# Patient Record
Sex: Female | Born: 1945 | Hispanic: No | Smoking: Former smoker
Health system: Southern US, Community
[De-identification: ages and names within clinical notes are randomized; demographics above are authoritative.]

## PROBLEM LIST (undated history)

## (undated) DIAGNOSIS — M1990 Unspecified osteoarthritis, unspecified site: Secondary | ICD-10-CM

## (undated) DIAGNOSIS — J189 Pneumonia, unspecified organism: Secondary | ICD-10-CM

## (undated) DIAGNOSIS — D649 Anemia, unspecified: Secondary | ICD-10-CM

## (undated) DIAGNOSIS — F32A Depression, unspecified: Secondary | ICD-10-CM

## (undated) DIAGNOSIS — I10 Essential (primary) hypertension: Secondary | ICD-10-CM

## (undated) DIAGNOSIS — M4800 Spinal stenosis, site unspecified: Secondary | ICD-10-CM

## (undated) DIAGNOSIS — E119 Type 2 diabetes mellitus without complications: Secondary | ICD-10-CM

## (undated) HISTORY — DX: Essential (primary) hypertension: I10

## (undated) HISTORY — PX: WRIST GANGLION EXCISION: SUR520

## (undated) HISTORY — PX: UTERINE FIBROID SURGERY: SHX826

## (undated) HISTORY — PX: CHOLECYSTECTOMY: SHX55

## (undated) HISTORY — PX: APPENDECTOMY: SHX54

## (undated) HISTORY — DX: Type 2 diabetes mellitus without complications: E11.9

---

## 2007-12-06 ENCOUNTER — Other Ambulatory Visit: Admission: RE | Admit: 2007-12-06 | Discharge: 2007-12-06 | Payer: Self-pay | Admitting: Family Medicine

## 2010-12-25 ENCOUNTER — Other Ambulatory Visit: Payer: Self-pay | Admitting: Family Medicine

## 2010-12-25 DIAGNOSIS — Z1231 Encounter for screening mammogram for malignant neoplasm of breast: Secondary | ICD-10-CM

## 2011-01-19 ENCOUNTER — Ambulatory Visit
Admission: RE | Admit: 2011-01-19 | Discharge: 2011-01-19 | Disposition: A | Discharge status: Home / Self Care | Payer: Medicare Other | Source: Ambulatory Visit | Attending: Family Medicine | Admitting: Family Medicine

## 2011-01-19 DIAGNOSIS — Z1231 Encounter for screening mammogram for malignant neoplasm of breast: Secondary | ICD-10-CM

## 2011-12-11 ENCOUNTER — Other Ambulatory Visit: Payer: Self-pay | Admitting: Family Medicine

## 2011-12-11 DIAGNOSIS — Z1231 Encounter for screening mammogram for malignant neoplasm of breast: Secondary | ICD-10-CM

## 2012-01-19 ENCOUNTER — Ambulatory Visit
Admission: RE | Admit: 2012-01-19 | Discharge: 2012-01-19 | Disposition: A | Discharge status: Home / Self Care | Payer: Medicare Other | Source: Ambulatory Visit | Attending: Family Medicine | Admitting: Family Medicine

## 2012-01-19 DIAGNOSIS — Z1231 Encounter for screening mammogram for malignant neoplasm of breast: Secondary | ICD-10-CM

## 2012-08-18 ENCOUNTER — Other Ambulatory Visit: Payer: Self-pay

## 2012-08-18 DIAGNOSIS — Z1231 Encounter for screening mammogram for malignant neoplasm of breast: Secondary | ICD-10-CM

## 2013-01-02 ENCOUNTER — Ambulatory Visit
Admission: RE | Admit: 2013-01-02 | Discharge: 2013-01-02 | Disposition: A | Discharge status: Home / Self Care | Payer: Medicare Other | Source: Ambulatory Visit

## 2013-01-02 DIAGNOSIS — Z1231 Encounter for screening mammogram for malignant neoplasm of breast: Secondary | ICD-10-CM

## 2013-01-18 ENCOUNTER — Ambulatory Visit: Payer: Medicare Other

## 2013-05-26 ENCOUNTER — Telehealth: Payer: Self-pay

## 2013-05-26 NOTE — Telephone Encounter (Signed)
Not our pt. Joseph Art will fax back notice to pharm.

## 2013-05-26 NOTE — Telephone Encounter (Signed)
Received request for potassium chloride crys ER 20 MEQ.  Requests 90 day supply.

## 2013-09-06 ENCOUNTER — Other Ambulatory Visit: Payer: Self-pay

## 2013-09-06 DIAGNOSIS — Z1231 Encounter for screening mammogram for malignant neoplasm of breast: Secondary | ICD-10-CM

## 2014-01-03 ENCOUNTER — Ambulatory Visit
Admission: RE | Admit: 2014-01-03 | Discharge: 2014-01-03 | Disposition: A | Discharge status: Home / Self Care | Payer: Medicare Other | Source: Ambulatory Visit

## 2014-01-03 DIAGNOSIS — Z1231 Encounter for screening mammogram for malignant neoplasm of breast: Secondary | ICD-10-CM

## 2014-09-25 ENCOUNTER — Other Ambulatory Visit: Payer: Self-pay

## 2014-09-25 DIAGNOSIS — Z1231 Encounter for screening mammogram for malignant neoplasm of breast: Secondary | ICD-10-CM

## 2015-01-04 ENCOUNTER — Ambulatory Visit
Admission: RE | Admit: 2015-01-04 | Discharge: 2015-01-04 | Disposition: A | Discharge status: Home / Self Care | Payer: Medicare HMO | Source: Ambulatory Visit

## 2015-01-04 DIAGNOSIS — Z1231 Encounter for screening mammogram for malignant neoplasm of breast: Secondary | ICD-10-CM

## 2015-10-21 ENCOUNTER — Other Ambulatory Visit: Payer: Self-pay | Admitting: Family Medicine

## 2015-10-21 DIAGNOSIS — Z1231 Encounter for screening mammogram for malignant neoplasm of breast: Secondary | ICD-10-CM

## 2016-01-06 ENCOUNTER — Ambulatory Visit
Admission: RE | Admit: 2016-01-06 | Discharge: 2016-01-06 | Disposition: A | Discharge status: Home / Self Care | Payer: Medicare HMO | Source: Ambulatory Visit | Attending: Family Medicine | Admitting: Family Medicine

## 2016-01-06 DIAGNOSIS — Z1231 Encounter for screening mammogram for malignant neoplasm of breast: Secondary | ICD-10-CM

## 2016-10-19 ENCOUNTER — Other Ambulatory Visit: Payer: Self-pay | Admitting: Family Medicine

## 2016-10-19 DIAGNOSIS — Z1231 Encounter for screening mammogram for malignant neoplasm of breast: Secondary | ICD-10-CM

## 2017-01-06 ENCOUNTER — Ambulatory Visit
Admission: RE | Admit: 2017-01-06 | Discharge: 2017-01-06 | Disposition: A | Discharge status: Home / Self Care | Payer: Medicare HMO | Source: Ambulatory Visit | Attending: Family Medicine | Admitting: Family Medicine

## 2017-01-06 DIAGNOSIS — Z1231 Encounter for screening mammogram for malignant neoplasm of breast: Secondary | ICD-10-CM

## 2017-10-26 ENCOUNTER — Other Ambulatory Visit: Payer: Self-pay | Admitting: Family Medicine

## 2017-10-26 DIAGNOSIS — Z1231 Encounter for screening mammogram for malignant neoplasm of breast: Secondary | ICD-10-CM

## 2018-01-25 ENCOUNTER — Ambulatory Visit
Admission: RE | Admit: 2018-01-25 | Discharge: 2018-01-25 | Disposition: A | Discharge status: Home / Self Care | Payer: Medicare HMO | Source: Ambulatory Visit | Attending: Family Medicine | Admitting: Family Medicine

## 2018-01-25 DIAGNOSIS — Z1231 Encounter for screening mammogram for malignant neoplasm of breast: Secondary | ICD-10-CM

## 2018-11-25 ENCOUNTER — Other Ambulatory Visit: Payer: Self-pay | Admitting: Family Medicine

## 2018-11-25 DIAGNOSIS — Z1231 Encounter for screening mammogram for malignant neoplasm of breast: Secondary | ICD-10-CM

## 2019-01-31 ENCOUNTER — Other Ambulatory Visit: Payer: Self-pay

## 2019-01-31 ENCOUNTER — Ambulatory Visit
Admission: RE | Admit: 2019-01-31 | Discharge: 2019-01-31 | Disposition: A | Discharge status: Home / Self Care | Payer: Medicare HMO | Source: Ambulatory Visit | Attending: Family Medicine | Admitting: Family Medicine

## 2019-01-31 DIAGNOSIS — Z1231 Encounter for screening mammogram for malignant neoplasm of breast: Secondary | ICD-10-CM

## 2019-12-21 ENCOUNTER — Other Ambulatory Visit: Payer: Self-pay | Admitting: Family Medicine

## 2019-12-21 DIAGNOSIS — Z1231 Encounter for screening mammogram for malignant neoplasm of breast: Secondary | ICD-10-CM

## 2020-02-02 ENCOUNTER — Other Ambulatory Visit: Payer: Self-pay

## 2020-02-02 ENCOUNTER — Ambulatory Visit
Admission: RE | Admit: 2020-02-02 | Discharge: 2020-02-02 | Disposition: A | Discharge status: Home / Self Care | Payer: Medicare HMO | Source: Ambulatory Visit | Attending: Family Medicine | Admitting: Family Medicine

## 2020-02-02 DIAGNOSIS — Z1231 Encounter for screening mammogram for malignant neoplasm of breast: Secondary | ICD-10-CM

## 2020-12-30 ENCOUNTER — Other Ambulatory Visit: Payer: Self-pay | Admitting: Family Medicine

## 2020-12-30 DIAGNOSIS — Z1231 Encounter for screening mammogram for malignant neoplasm of breast: Secondary | ICD-10-CM

## 2021-02-05 ENCOUNTER — Ambulatory Visit
Admission: RE | Admit: 2021-02-05 | Discharge: 2021-02-05 | Disposition: A | Discharge status: Home / Self Care | Payer: Medicare HMO | Source: Ambulatory Visit | Attending: Family Medicine | Admitting: Family Medicine

## 2021-02-05 DIAGNOSIS — Z1231 Encounter for screening mammogram for malignant neoplasm of breast: Secondary | ICD-10-CM

## 2021-05-07 ENCOUNTER — Encounter: Payer: Self-pay | Admitting: Gastroenterology

## 2021-05-30 ENCOUNTER — Encounter: Payer: Self-pay | Admitting: Gastroenterology

## 2021-05-30 ENCOUNTER — Ambulatory Visit: Payer: Medicare HMO | Admitting: Gastroenterology

## 2021-05-30 VITALS — Ht 67.0 in | Wt 185.0 lb

## 2021-05-30 DIAGNOSIS — R195 Other fecal abnormalities: Secondary | ICD-10-CM

## 2021-05-30 MED ORDER — NA SULFATE-K SULFATE-MG SULF 17.5-3.13-1.6 GM/177ML PO SOLN: 1.0000 | Freq: Once | ORAL | 0 refills | Status: AC

## 2021-05-30 NOTE — Progress Notes (Signed)
? ?Referring Provider: Katherina Mires, MD ?Primary Care Physician:  Katherina Mires, MD ? ? ?Reason for Consultation:  Fecal occult blood in the stool ? ? ?IMPRESSION:  ?Fecal occult blood test positive without overt bleeding ?No associated anemia ? ?PLAN: ?Obtain colonoscopy records from Dr. Cristina Gong 10 years ?Colonoscopy ? ?I consented the patient discussing the risks, benefits, and alternatives to endoscopic evaluation. The patient acknowledges these risks and asks that we proceed.  ? ? ?HPI: Melissa Leon is a 76 y.o. female referred by PA Mellody Drown for positive fecal occult blood test.  The history is obtained through the patient and review of her electronic health record as well as paper records provided by the referring provider.  She has a history of hypertension, hyperlipidemia, type 2 diabetes with hyperlipidemia on Ozempic, obesity, and B12 deficiency. ? ?Recent fecal occult blood test was positive. ?Labs show hemoglobin 14.4, MCV 92, RDW 11.7, platelets 238 ? ?Had a GI bug in 12/22. Bowel habits returned to normal following the acute illness. No overt GI blood loss. No melena, hematochezia, bright red blood per rectum. No abdominal pain.. No epistaxis, vaginal bleeding, hemoptysis, or hematuria.  She is having constipation since starting Ozempic.  ? ?Normal colonoscopy 10 years ago at [facility]. ? ?There is no known family history of colon cancer or polyps. No family history of stomach cancer or other GI malignancy. No family history of inflammatory bowel disease or celiac.  ? ? ?Past Medical History:  ?Diagnosis Date  ? Diabetes mellitus ([place])   ? Hypertension   ? ? ?Past Surgical History:  ?Procedure Laterality Date  ? APPENDECTOMY    ? APPENDECTOMY    ? CHOLECYSTECTOMY    ? ? ? ?Current Outpatient Medications  ?Medication Sig Dispense Refill  ? glimepiride (AMARYL) 4 MG tablet Take 2 tablets by mouth daily.    ? amLODipine (NORVASC) 5 MG tablet Take 5 mg by mouth daily.    ? aspirin 81 MG chewable tablet  Chew 81 mg by mouth daily.    ? atorvastatin (LIPITOR) 10 MG tablet Take 10 mg by mouth at bedtime.    ? co-enzyme Q-10 30 MG capsule Take 1 capsule by mouth daily.    ? hydrochlorothiazide (HYDRODIURIL) 25 MG tablet Take 25 mg by mouth every morning.    ? losartan (COZAAR) 100 MG tablet Take 100 mg by mouth daily.    ? metFORMIN (GLUCOPHAGE-XR) 500 MG 24 hr tablet Take 4 tablets by mouth daily as needed.    ? OZEMPIC, 0.25 OR 0.5 MG/DOSE, 2 MG/3ML SOPN Inject 0.5 mg into the skin once a week.    ? ?No current facility-administered medications for this visit.  ? ? ?Allergies as of 05/30/2021 - Review Complete 05/30/2021  ?Allergen Reaction Noted  ? Choline fenofibrate Other (See Comments) 04/03/2013  ? Penicillins Hives 04/03/2013  ? ? ?Family History  ?Problem Relation Age of Onset  ? Breast cancer Neg Hx   ? ? ?Social History  ? ?Socioeconomic History  ? Marital status: Widowed  ?  Spouse name: Not on file  ? Number of children: Not on file  ? Years of education: Not on file  ? Highest education level: Not on file  ?Occupational History  ? Not on file  ?Tobacco Use  ? Smoking status: Never  ? Smokeless tobacco: Never  ?Vaping Use  ? Vaping Use: Never used  ?Substance and Sexual Activity  ? Alcohol use: Yes  ? Drug use: Never  ?  Sexual activity: Yes  ?Other Topics Concern  ? Not on file  ?Social History Narrative  ? Not on file  ? ?Social Determinants of Health  ? ?Financial Resource Strain: Not on file  ?Food Insecurity: Not on file  ?Transportation Needs: Not on file  ?Physical Activity: Not on file  ?Stress: Not on file  ?Social Connections: Not on file  ?Intimate Partner Violence: Not on file  ? ? ?Review of Systems: ?12 system ROS is negative except as noted above with back pain, swelling legs, urinary leakage.  ? ?Physical Exam: ?General:   Alert,  well-nourished, pleasant and cooperative in NAD ?Head:  Normocephalic and atraumatic. ?Eyes:  Sclera clear, no icterus.   Conjunctiva pink. ?Ears:  Normal auditory  acuity. ?Nose:  No deformity, discharge,  or lesions. ?Mouth:  No deformity or lesions.   ?Neck:  Supple; no masses or thyromegaly. ?Lungs:  Clear throughout to auscultation.   No wheezes. ?Heart:  Regular rate and rhythm; no murmurs. ?Abdomen:  Soft, nontender, nondistended, normal bowel sounds, no rebound or guarding. No hepatosplenomegaly.   ?Rectal:  Deferred  ?Msk:  Symmetrical. No boney deformities ?LAD: No inguinal or umbilical LAD ?Extremities:  No clubbing or edema. ?Neurologic:  Alert and  oriented x4;  grossly nonfocal ?Skin:  Intact without significant lesions or rashes. ?Psych:  Alert and cooperative. Normal mood and affect. ? ?I spent 45 minutes, including in depth chart review, independent review of results, communicating results with the patient directly, face-to-face time with the patient, coordinating care, ordering studies and medications as appropriate, and documentation.   ? ?Ima Hafner L. Tarri Glenn, MD, MPH ?05/30/2021, 3:49 PM ? ? ? ?  ?

## 2021-05-30 NOTE — Patient Instructions (Signed)
It was my pleasure to provide care to you today. Based on our discussion, I am providing you with my recommendations below: ? ?RECOMMENDATION(S):  ? ?COLONOSCOPY:  ? ?You have been scheduled for a colonoscopy. Please follow written instructions given to you at your visit today.  ? ?PREP:  ? ?Please pick up your prep supplies at the pharmacy within the next 1-3 days. ? ?INHALERS:  ? ?If you use inhalers (even only as needed), please bring them with you on the day of your procedure. ? ? ?COLONOSCOPY TIPS: ? ?To reduce nausea and dehydration, stay well hydrated for 3-4 days prior to the exam.  ?To prevent skin/hemorrhoid irritation - prior to wiping, put A&Dointment or vaseline on the toilet paper. ?Keep a towel or pad on the bed.  ?BEFORE STARTING YOUR PREP, drink  64oz of clear liquids in the morning. This will help to flush the colon and will ensure you are well hydrated!!!!  ?NOTE - This is in addition to the fluids required for to complete your prep. ?Use of a flavored hard candy, such as grape Anise Salvo, can counteract some of the flavor of the prep and may prevent some nausea.  ? ? ?FOLLOW UP: ? ? ?After your procedure, you will receive a call from my office staff regarding my recommendation for follow up. ? ?BMI: ? ?If you are age 47 or older, your body mass index should be between 23-30. Your Body mass index is 28.98 kg/m?Marland Kitchen If this is out of the aforementioned range listed, please consider follow up with your Primary Care Provider. ? ?If you are age 55 or younger, your body mass index should be between 19-25. Your Body mass index is 28.98 kg/m?Marland Kitchen If this is out of the aformentioned range listed, please consider follow up with your Primary Care Provider.  ? ?MY CHART: ? ?The [facility] providers would like to encourage you to use [facility] to communicate with providers for non-urgent requests or questions.  Due to long hold times on the telephone, sending your provider a message by [facility] may be a faster and  more efficient way to get a response.  Please allow 48 business hours for a response.  Please remember that this is for non-urgent requests.  ? ?Thank you for trusting me with your gastrointestinal care!   ? ?Thornton Park, MD, MPH ? ?

## 2021-06-02 ENCOUNTER — Encounter: Payer: Self-pay | Admitting: Gastroenterology

## 2021-06-03 ENCOUNTER — Encounter: Payer: Self-pay | Admitting: Gastroenterology

## 2021-06-03 ENCOUNTER — Ambulatory Visit (AMBULATORY_SURGERY_CENTER): Payer: Medicare HMO | Admitting: Gastroenterology

## 2021-06-03 VITALS — BP 162/82 | HR 79 | Temp 97.1°F | Resp 11 | Ht 67.0 in | Wt 185.0 lb

## 2021-06-03 DIAGNOSIS — K5730 Diverticulosis of large intestine without perforation or abscess without bleeding: Secondary | ICD-10-CM | POA: Diagnosis not present

## 2021-06-03 DIAGNOSIS — D122 Benign neoplasm of ascending colon: Secondary | ICD-10-CM

## 2021-06-03 DIAGNOSIS — R195 Other fecal abnormalities: Secondary | ICD-10-CM

## 2021-06-03 DIAGNOSIS — D123 Benign neoplasm of transverse colon: Secondary | ICD-10-CM

## 2021-06-03 MED ORDER — SODIUM CHLORIDE 0.9 % IV SOLN: 500.0000 mL | Freq: Once | INTRAVENOUS | Status: DC

## 2021-06-03 NOTE — Progress Notes (Signed)
Called to room to assist during endoscopic procedure.  Patient ID and intended procedure confirmed with present staff. Received instructions for my participation in the procedure from the performing physician.  

## 2021-06-03 NOTE — Op Note (Signed)
[facility] ?Patient Name: Melissa Leon ?Procedure Date: 06/03/2021 2:51 PM ?MRN: [id number] ?Endoscopist: Thornton Park MD, MD ?Age: 76 ?Referring MD:  ?Date of Birth: 1945/09/04 ?Gender: Female ?Account #: [id number] ?Procedure:                Colonoscopy ?Indications:              Heme positive stool ?                          Normal colonoscopy at [facility] about 10 years ago ?Medicines:                Monitored Anesthesia Care ?Procedure:                Pre-Anesthesia Assessment: ?                          - Prior to the procedure, a History and Physical  ?                          was performed, and patient medications and  ?                          allergies were reviewed. The patient's tolerance of  ?                          previous anesthesia was also reviewed. The risks  ?                          and benefits of the procedure and the sedation  ?                          options and risks were discussed with the patient.  ?                          All questions were answered, and informed consent  ?                          was obtained. Prior Anticoagulants: The patient has  ?                          taken no previous anticoagulant or antiplatelet  ?                          agents. ASA Grade Assessment: III - A patient with  ?                          severe systemic disease. After reviewing the risks  ?                          and benefits, the patient was deemed in  ?                          satisfactory condition to undergo the procedure. ?  After obtaining informed consent, the colonoscope  ?                          was passed under direct vision. Throughout the  ?                          procedure, the patient's blood pressure, pulse, and  ?                          oxygen saturations were monitored continuously. The  ?                          CF [id number] #[id number] was introduced through the anus  ?                          and advanced to the the cecum,  identified by  ?                          appendiceal orifice and ileocecal valve. A second  ?                          forward view of the right colon was performed. The  ?                          colonoscopy was performed without difficulty. The  ?                          patient tolerated the procedure well. The quality  ?                          of the bowel preparation was good. The terminal  ?                          ileum, ileocecal valve, appendiceal orifice, and  ?                          rectum were photographed. ?Scope In: 3:02:07 PM ?Scope Out: 3:25:17 PM ?Scope Withdrawal Time: 0 hours 16 minutes 39 seconds  ?Total Procedure Duration: 0 hours 23 minutes 10 seconds  ?Findings:                 The perianal and digital rectal examinations were  ?                          normal. ?                          Multiple small and large-mouthed diverticula were  ?                          found in the sigmoid colon, descending colon and  ?                          ascending colon. ?  Three sessile polyps were found in the transverse  ?                          colon and ascending colon. The polyps were 2 to 3  ?                          mm in size. These polyps were removed with a cold  ?                          snare. Resection and retrieval were complete.  ?                          Estimated blood loss was minimal. ?                          The exam was otherwise without abnormality on  ?                          direct and retroflexion views. ?Complications:            No immediate complications. ?Estimated Blood Loss:     Estimated blood loss was minimal. ?Impression:               - Diverticulosis in the sigmoid colon and in the  ?                          descending colon. ?                          - Three 2 to 3 mm polyps in the transverse colon  ?                          and in the ascending colon, removed with a cold  ?                          snare. Resected and  retrieved. ?                          - The examination was otherwise normal on direct  ?                          and retroflexion views. ?Recommendation:           - Patient has a contact number available for  ?                          emergencies. The signs and symptoms of potential  ?                          delayed complications were discussed with the  ?                          patient. Return to normal activities tomorrow.  ?  Written discharge instructions were provided to the  ?                          patient. ?                          - Resume previous diet. High fiber diet recommended. ?                          - Continue present medications. ?                          - Await pathology results. ?                          - Repeat colonoscopy is not routinely recommended  ?                          for surveillance given her current age. ?                          - Emerging evidence supports eating a diet of  ?                          fruits, vegetables, grains, calcium, and yogurt  ?                          while reducing red meat and alcohol may reduce the  ?                          risk of colon cancer. ?Thornton Park MD, MD ?06/03/2021 3:33:42 PM ?This report has been signed electronically. ?

## 2021-06-03 NOTE — Patient Instructions (Signed)
Follow a high fiber diet. Awaiting pathology results. Repeat Colonoscopy not recommended due to age. Follow up as needed in GI office ? ?YOU HAD AN ENDOSCOPIC PROCEDURE TODAY AT [facility]:   Refer to the procedure report that was given to you for any specific questions about what was found during the examination.  If the procedure report does not answer your questions, please call your gastroenterologist to clarify.  If you requested that your care partner not be given the details of your procedure findings, then the procedure report has been included in a sealed envelope for you to review at your convenience later. ? ?YOU SHOULD EXPECT: Some feelings of bloating in the abdomen. Passage of more gas than usual.  Walking can help get rid of the air that was put into your GI tract during the procedure and reduce the bloating. If you had a lower endoscopy (such as a colonoscopy or flexible sigmoidoscopy) you may notice spotting of blood in your stool or on the toilet paper. If you underwent a bowel prep for your procedure, you may not have a normal bowel movement for a few days. ? ?Please Note:  You might notice some irritation and congestion in your nose or some drainage.  This is from the oxygen used during your procedure.  There is no need for concern and it should clear up in a day or so. ? ?SYMPTOMS TO REPORT IMMEDIATELY: ? ?Following lower endoscopy (colonoscopy or flexible sigmoidoscopy): ? Excessive amounts of blood in the stool ? Significant tenderness or worsening of abdominal pains ? Swelling of the abdomen that is new, acute ? Fever of 100?F or higher ? ?For urgent or emergent issues, a gastroenterologist can be reached at any hour by calling [phone]. ?Do not use MyChart messaging for urgent concerns.  ? ? ?DIET:  We do recommend a small meal at first, but then you may proceed to your regular diet.  Drink plenty of fluids but you should avoid alcoholic beverages for 24  hours. ? ?ACTIVITY:  You should plan to take it easy for the rest of today and you should NOT DRIVE or use heavy machinery until tomorrow (because of the sedation medicines used during the test).   ? ?FOLLOW UP: ?Our staff will call the number listed on your records 48-72 hours following your procedure to check on you and address any questions or concerns that you may have regarding the information given to you following your procedure. If we do not reach you, we will leave a message.  We will attempt to reach you two times.  During this call, we will ask if you have developed any symptoms of COVID 19. If you develop any symptoms (ie: fever, flu-like symptoms, shortness of breath, cough etc.) before then, please call [phone].  If you test positive for Covid 19 in the 2 weeks post procedure, please call and report this information to [place].   ? ?If any biopsies were taken you will be contacted by phone or by letter within the next 1-3 weeks.  Please call us at [phone] if you have not heard about the biopsies in 3 weeks.  ? ? ?SIGNATURES/CONFIDENTIALITY: ?You and/or your care partner have signed paperwork which will be entered into your electronic medical record.  These signatures attest to the fact that that the information above on your After Visit Summary has been reviewed and is understood.  Full responsibility of the confidentiality of this discharge information lies with you  and/or your care-partner.  ?

## 2021-06-03 NOTE — Progress Notes (Signed)
Indications for colonoscopy: Fecal occult blood test positive without overt bleeding ? ?Please see my 05/30/2021 office details.  There is been no change in history or physical exam since that time.  The patient remains an appropriate candidate for monitored anesthesia care in the endoscopy. ?

## 2021-06-05 ENCOUNTER — Telehealth: Payer: Self-pay | Admitting: *Deleted

## 2021-06-05 NOTE — Telephone Encounter (Signed)
  Follow up Call-     06/03/2021    2:03 PM  Call back number  Post procedure Call Back phone  # [phone]  Permission to leave phone message Yes     Patient questions:  Do you have a fever, pain , or abdominal swelling? No. Pain Score  0 *  Have you tolerated food without any problems? Yes.    Have you been able to return to your normal activities? Yes.    Do you have any questions about your discharge instructions: Diet   No. Medications  No. Follow up visit  No.  Do you have questions or concerns about your Care? No.  Actions: * If pain score is 4 or above: No action needed, pain <4.

## 2021-06-06 ENCOUNTER — Encounter: Payer: Self-pay | Admitting: Gastroenterology

## 2021-06-25 IMAGING — MG DIGITAL SCREENING BILAT W/ TOMO W/ CAD
6 of 10 series · 6 of 30 positions shown · non-contrast
Comparison: Previous exam(s).

CLINICAL DATA: Screening.

EXAM:
DIGITAL SCREENING BILATERAL MAMMOGRAM WITH TOMO AND CAD

[L MLO synth-2D (1 of 2)]
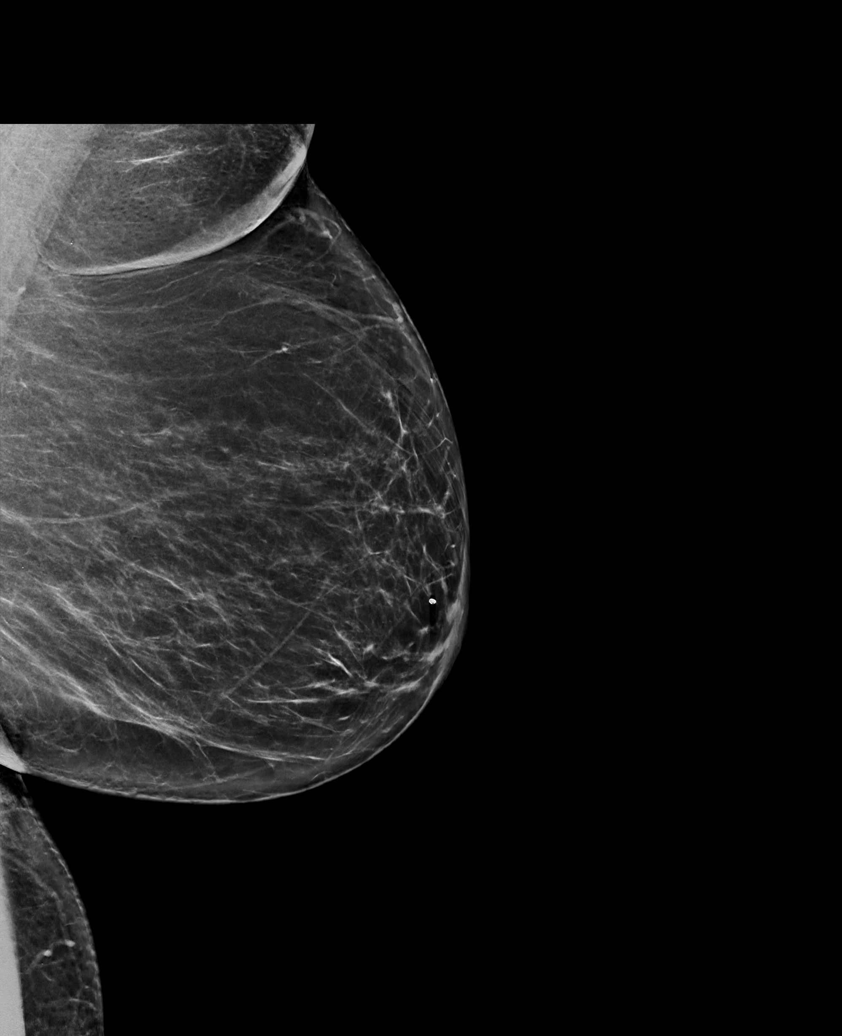

[L MLO synth-2D (2 of 2)]
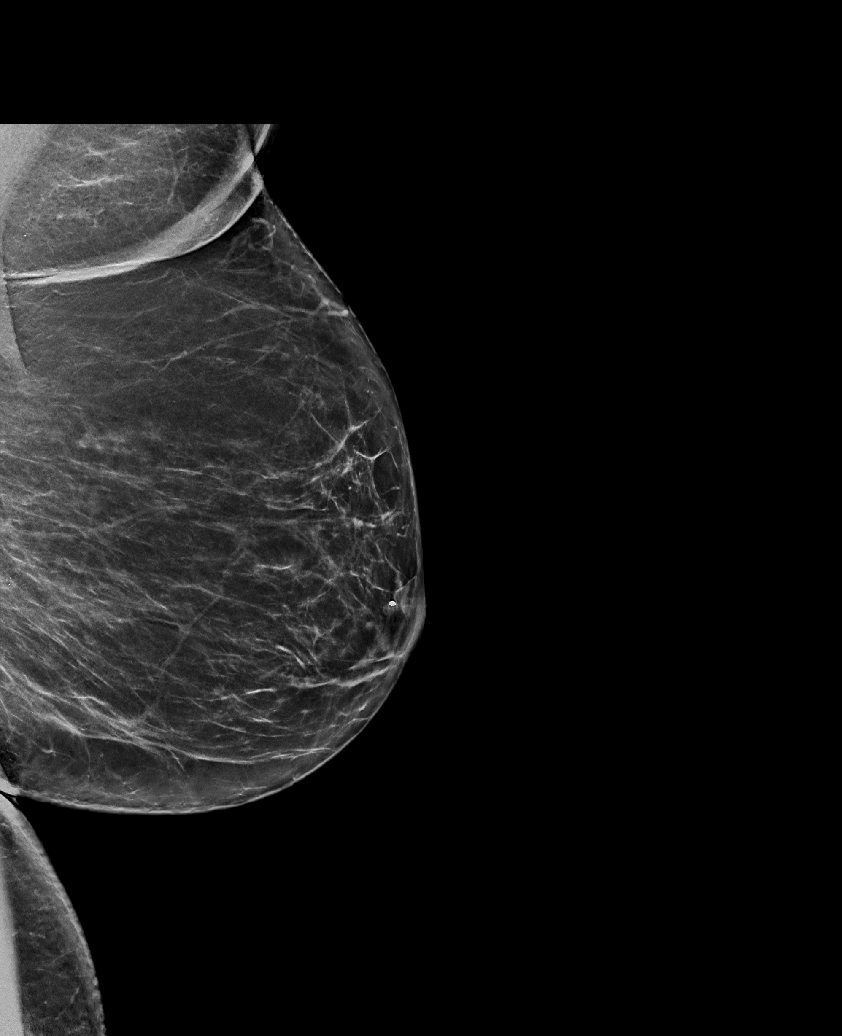

[R CC synth-2D]
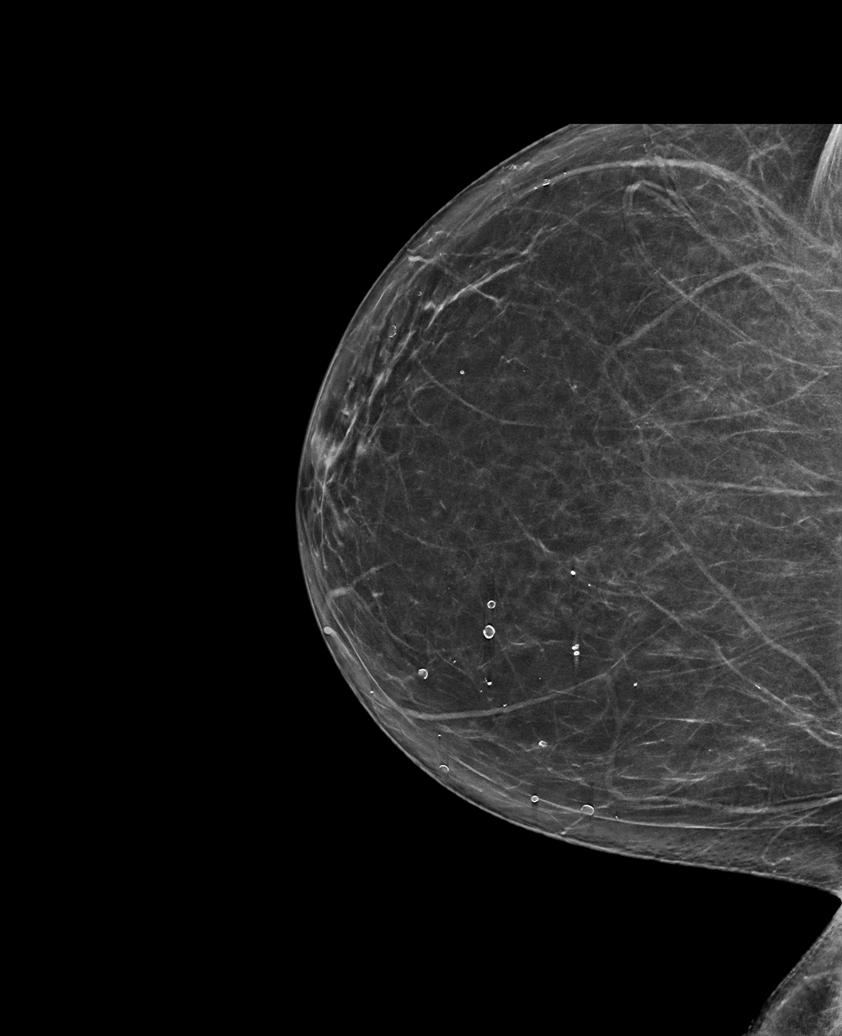

[R MLO synth-2D]
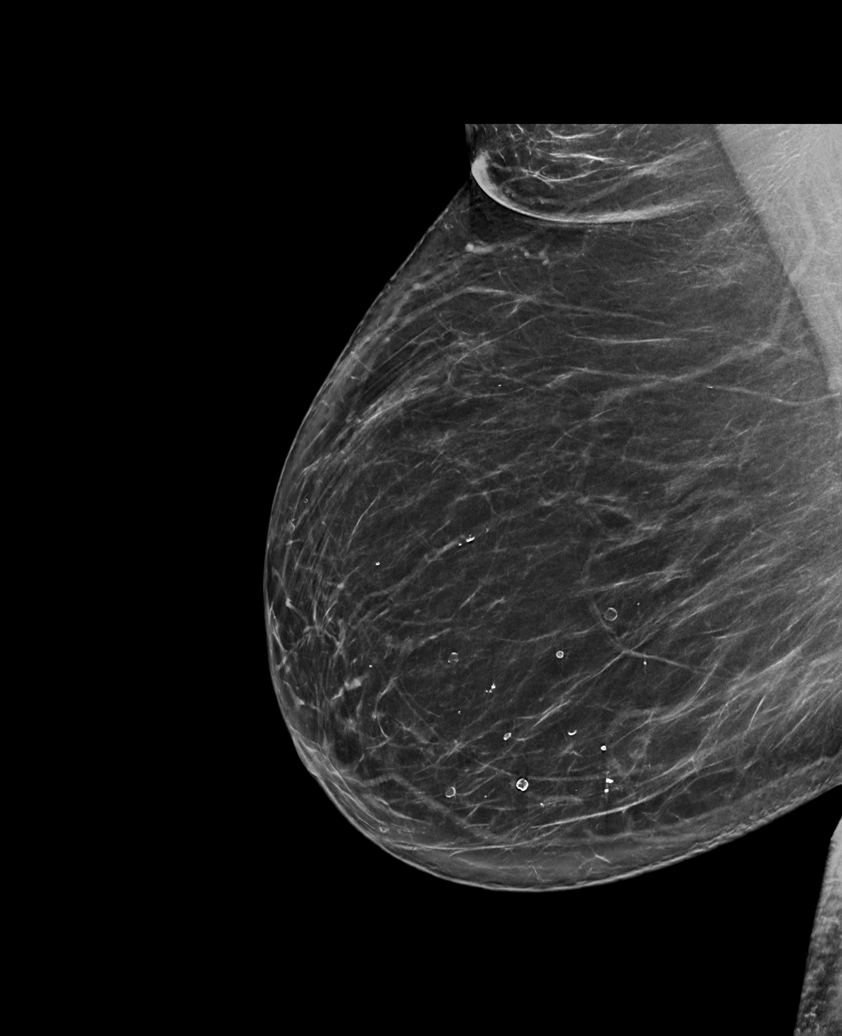

[L CC synth-2D]
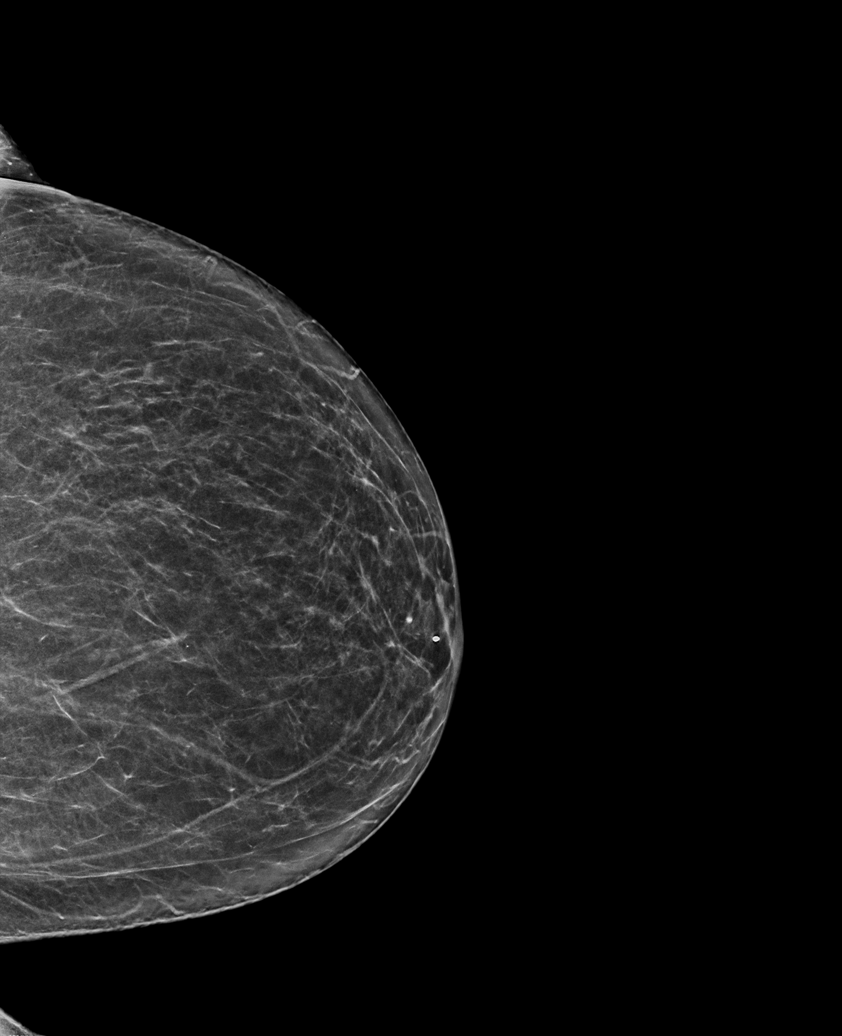

[R MLO tomo · tomo slice 42/83.0]
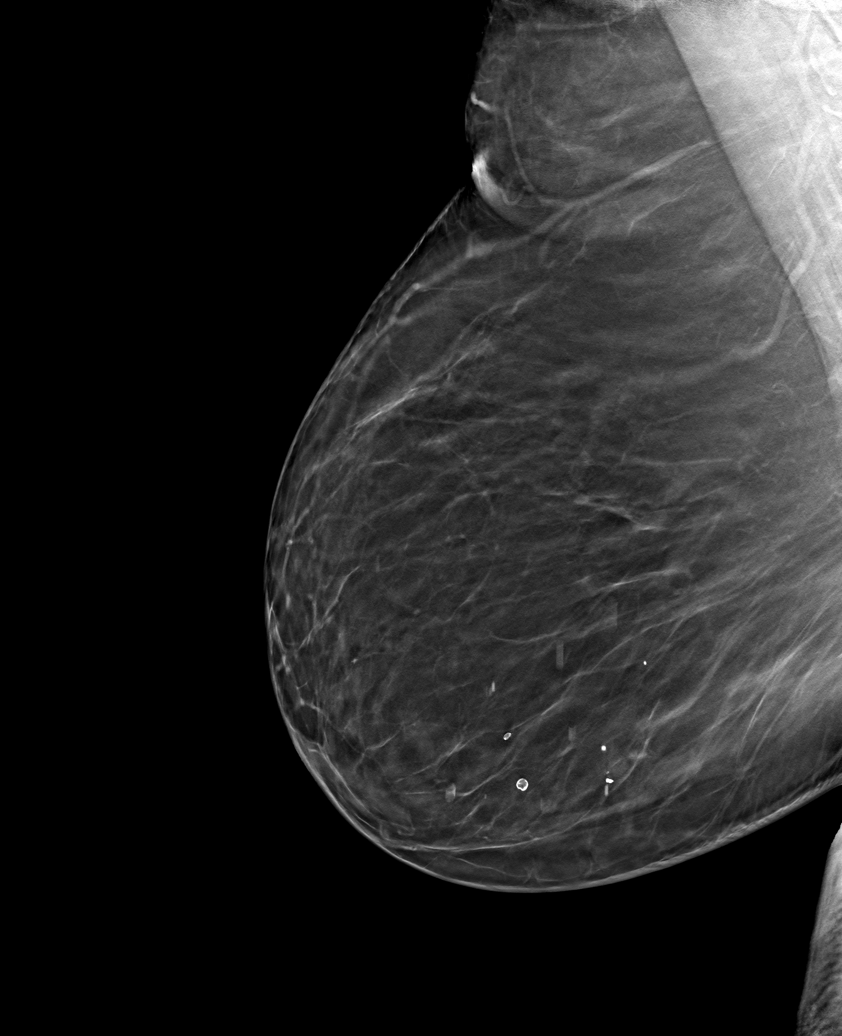

[6 of 30 positions shown; findings below may reference images not displayed]

ACR Breast Density Category b: There are scattered areas of
fibroglandular density.
FINDINGS: There are no findings suspicious for malignancy. Images were
processed with CAD.
IMPRESSION: No mammographic evidence of malignancy. A result letter of this
screening mammogram will be mailed directly to the patient.

RECOMMENDATION:
Screening mammogram in one year. (Code:[id number])

BI-RADS CATEGORY  1: Negative.

## 2022-01-02 ENCOUNTER — Other Ambulatory Visit: Payer: Self-pay

## 2022-01-02 ENCOUNTER — Encounter (HOSPITAL_BASED_OUTPATIENT_CLINIC_OR_DEPARTMENT_OTHER): Payer: Self-pay | Admitting: Emergency Medicine

## 2022-01-02 DIAGNOSIS — R197 Diarrhea, unspecified: Secondary | ICD-10-CM | POA: Insufficient documentation

## 2022-01-02 DIAGNOSIS — D72829 Elevated white blood cell count, unspecified: Secondary | ICD-10-CM | POA: Insufficient documentation

## 2022-01-02 DIAGNOSIS — E876 Hypokalemia: Secondary | ICD-10-CM | POA: Insufficient documentation

## 2022-01-02 DIAGNOSIS — Z1152 Encounter for screening for COVID-19: Secondary | ICD-10-CM | POA: Insufficient documentation

## 2022-01-02 DIAGNOSIS — E119 Type 2 diabetes mellitus without complications: Secondary | ICD-10-CM | POA: Insufficient documentation

## 2022-01-02 DIAGNOSIS — I10 Essential (primary) hypertension: Secondary | ICD-10-CM | POA: Insufficient documentation

## 2022-01-02 DIAGNOSIS — R110 Nausea: Secondary | ICD-10-CM | POA: Insufficient documentation

## 2022-01-02 DIAGNOSIS — E860 Dehydration: Secondary | ICD-10-CM | POA: Insufficient documentation

## 2022-01-02 DIAGNOSIS — N3000 Acute cystitis without hematuria: Secondary | ICD-10-CM | POA: Insufficient documentation

## 2022-01-02 DIAGNOSIS — A4151 Sepsis due to Escherichia coli [E. coli]: Secondary | ICD-10-CM | POA: Diagnosis not present

## 2022-01-02 DIAGNOSIS — N12 Tubulo-interstitial nephritis, not specified as acute or chronic: Secondary | ICD-10-CM | POA: Diagnosis not present

## 2022-01-02 LAB — RESP PANEL BY RT-PCR (RSV, FLU A&B, COVID)  RVPGX2
Influenza A by PCR: NEGATIVE
Influenza B by PCR: NEGATIVE
Resp Syncytial Virus by PCR: NEGATIVE
SARS Coronavirus 2 by RT PCR: NEGATIVE

## 2022-01-02 LAB — CBC WITH DIFFERENTIAL/PLATELET
Abs Immature Granulocytes: 0.07 10*3/uL (ref 0.00–0.07)
Basophils Absolute: 0 10*3/uL (ref 0.0–0.1)
Basophils Relative: 0 %
Eosinophils Absolute: 0 10*3/uL (ref 0.0–0.5)
Eosinophils Relative: 0 %
HCT: 36 % (ref 36.0–46.0)
Hemoglobin: 12.5 g/dL (ref 12.0–15.0)
Immature Granulocytes: 1 %
Lymphocytes Relative: 5 %
Lymphs Abs: 0.7 10*3/uL (ref 0.7–4.0)
MCH: 31.2 pg (ref 26.0–34.0)
MCHC: 34.7 g/dL (ref 30.0–36.0)
MCV: 89.8 fL (ref 80.0–100.0)
Monocytes Absolute: 1.4 10*3/uL — ABNORMAL HIGH (ref 0.1–1.0)
Monocytes Relative: 9 %
Neutro Abs: 13 10*3/uL — ABNORMAL HIGH (ref 1.7–7.7)
Neutrophils Relative %: 85 %
Platelets: 292 10*3/uL (ref 150–400)
RBC: 4.01 MIL/uL (ref 3.87–5.11)
RDW: 11.9 % (ref 11.5–15.5)
WBC: 15.2 10*3/uL — ABNORMAL HIGH (ref 4.0–10.5)
nRBC: 0 % (ref 0.0–0.2)

## 2022-01-02 LAB — BASIC METABOLIC PANEL
Anion gap: 15 (ref 5–15)
BUN: 26 mg/dL — ABNORMAL HIGH (ref 8–23)
CO2: 25 mmol/L (ref 22–32)
Calcium: 8.8 mg/dL — ABNORMAL LOW (ref 8.9–10.3)
Chloride: 92 mmol/L — ABNORMAL LOW (ref 98–111)
Creatinine, Ser: 1.46 mg/dL — ABNORMAL HIGH (ref 0.44–1.00)
GFR, Estimated: 37 mL/min — ABNORMAL LOW (ref >60)
Glucose, Bld: 164 mg/dL — ABNORMAL HIGH (ref 70–99)
Potassium: 3 mmol/L — ABNORMAL LOW (ref 3.5–5.1)
Sodium: 132 mmol/L — ABNORMAL LOW (ref 135–145)

## 2022-01-02 NOTE — ED Triage Notes (Signed)
Pt presents to ED POV w/ daughter. Pt c/o n/d x1w. Pt reports that she stopped taking ozempic thinking that it was causing her s/s. Pt reports that she saw PCP who wanted her to be scanned for obstruction

## 2022-01-03 ENCOUNTER — Emergency Department (HOSPITAL_BASED_OUTPATIENT_CLINIC_OR_DEPARTMENT_OTHER): Payer: Medicare HMO

## 2022-01-03 ENCOUNTER — Emergency Department (HOSPITAL_BASED_OUTPATIENT_CLINIC_OR_DEPARTMENT_OTHER)
Admission: EM | Admit: 2022-01-03 | Discharge: 2022-01-03 | Disposition: A | Discharge status: Home / Self Care | Payer: Medicare HMO | Source: Home / Self Care | Attending: Emergency Medicine | Admitting: Emergency Medicine

## 2022-01-03 DIAGNOSIS — R110 Nausea: Secondary | ICD-10-CM

## 2022-01-03 DIAGNOSIS — E876 Hypokalemia: Secondary | ICD-10-CM

## 2022-01-03 DIAGNOSIS — N3000 Acute cystitis without hematuria: Secondary | ICD-10-CM

## 2022-01-03 DIAGNOSIS — E860 Dehydration: Secondary | ICD-10-CM

## 2022-01-03 LAB — URINALYSIS, ROUTINE W REFLEX MICROSCOPIC
Bilirubin Urine: NEGATIVE
Glucose, UA: NEGATIVE mg/dL
Ketones, ur: NEGATIVE mg/dL
Nitrite: NEGATIVE
Protein, ur: 30 mg/dL — AB
Specific Gravity, Urine: 1.022 (ref 1.005–1.030)
WBC, UA: >50 WBC/hpf — ABNORMAL HIGH (ref 0–5)
pH: 6 (ref 5.0–8.0)

## 2022-01-03 MED ORDER — POTASSIUM CHLORIDE CRYS ER 20 MEQ PO TBCR
40.0000 meq | EXTENDED_RELEASE_TABLET | Freq: Once | ORAL | Status: AC
  Administered 2022-01-03: 40 meq via ORAL
  Filled 2022-01-03: qty 2

## 2022-01-03 MED ORDER — POTASSIUM CHLORIDE 10 MEQ/100ML IV SOLN
10.0000 meq | Freq: Once | INTRAVENOUS | Status: AC
  Administered 2022-01-03: 10 meq via INTRAVENOUS
  Filled 2022-01-03: qty 100

## 2022-01-03 MED ORDER — ONDANSETRON 4 MG PO TBDP: 4.0000 mg | ORAL_TABLET | Freq: Three times a day (TID) | ORAL | 0 refills | Status: DC | PRN

## 2022-01-03 MED ORDER — SODIUM CHLORIDE 0.9 % IV SOLN
1.0000 g | Freq: Once | INTRAVENOUS | Status: AC
  Administered 2022-01-03: 1 g via INTRAVENOUS
  Filled 2022-01-03: qty 10

## 2022-01-03 MED ORDER — SODIUM CHLORIDE 0.9 % IV BOLUS
1000.0000 mL | Freq: Once | INTRAVENOUS | Status: AC
  Administered 2022-01-03: 1000 mL via INTRAVENOUS

## 2022-01-03 MED ORDER — POTASSIUM CHLORIDE CRYS ER 20 MEQ PO TBCR: 40.0000 meq | EXTENDED_RELEASE_TABLET | Freq: Every day | ORAL | 0 refills | Status: DC

## 2022-01-03 MED ORDER — CEPHALEXIN 500 MG PO CAPS: 500.0000 mg | ORAL_CAPSULE | Freq: Three times a day (TID) | ORAL | 0 refills | Status: DC

## 2022-01-03 NOTE — Discharge Instructions (Signed)
You were seen today for nausea and decreased oral intake.  You may have a UTI.  Additionally you were noted to be dehydrated and have low potassium.  Take medications as prescribed.  Follow-up for recheck with your primary doctor within 1 week for repeat potassium.

## 2022-01-03 NOTE — ED Provider Notes (Signed)
[facility] EMERGENCY DEPT Provider Note   CSN: [id number] Arrival date & time: 01/02/22  1837     History  Chief Complaint  Patient presents with   Nausea    Rhys V Kipnis is a 76 y.o. female.  HPI     This is a 76 year old female who presents with nausea and decreased p.o. intake.  Patient reports 7 to 10-day history of worsening nausea and decreased p.o. intake.  She denies vomiting.  She states she has had some diarrhea and her last bowel movement was yesterday.  No abdominal pain.  Patient stopped taking Ozempic yesterday because she felt like her symptoms may be related.  She has been on Ozempic since April and has not had any recent adjustments in dosage.  She denies any fevers.  She reports of generalized malaise.  Patient was seen and evaluated by her primary physician yesterday.  Chart reviewed.  Slight leukocytosis on labs.  Baseline creatinine 1.38.  Home Medications Prior to Admission medications   Medication Sig Start Date End Date Taking? Authorizing Provider  cephALEXin (KEFLEX) 500 MG capsule Take 1 capsule (500 mg total) by mouth 3 (three) times daily. 01/03/22  Yes Edelin Fryer, Barbette Hair, MD  ondansetron (ZOFRAN-ODT) 4 MG disintegrating tablet Take 1 tablet (4 mg total) by mouth every 8 (eight) hours as needed for nausea or vomiting. 01/03/22  Yes Fantasia Jinkins, Barbette Hair, MD  potassium chloride SA (KLOR-CON M) 20 MEQ tablet Take 2 tablets (40 mEq total) by mouth daily. 01/03/22  Yes Georgeana Oertel, Barbette Hair, MD  amLODipine (NORVASC) 5 MG tablet Take 5 mg by mouth daily. 03/27/21   [provider]  aspirin 81 MG chewable tablet Chew 81 mg by mouth daily.    [provider]  atorvastatin (LIPITOR) 10 MG tablet Take 10 mg by mouth at bedtime. 04/29/21   [provider]  co-enzyme Q-10 30 MG capsule Take 1 capsule by mouth daily.    [provider]  glimepiride (AMARYL) 4 MG tablet Take 2 tablets by mouth daily. 05/12/21   [provider]  hydrochlorothiazide (HYDRODIURIL) 25 MG tablet Take 25 mg by mouth every morning. 04/21/21   [provider]  losartan (COZAAR) 100 MG tablet Take 100 mg by mouth daily. 04/21/21   [provider]  metFORMIN (GLUCOPHAGE-XR) 500 MG 24 hr tablet Take 4 tablets by mouth daily as needed. 03/27/21   [provider]  OZEMPIC, 0.25 OR 0.5 MG/DOSE, 2 MG/3ML SOPN Inject 0.5 mg into the skin once a week. 04/30/21   [provider]      Allergies    Choline fenofibrate and Penicillins    Review of Systems   Review of Systems  Constitutional:  Positive for appetite change. Negative for fever.  Respiratory:  Negative for cough and shortness of breath.   Cardiovascular:  Negative for chest pain.  Gastrointestinal:  Positive for nausea. Negative for abdominal pain.  All other systems reviewed and are negative.   Physical Exam Updated Vital Signs BP (!) 142/69   Pulse 91   Temp 98.8 F (37.1 C) (Oral)   Resp 15   SpO2 95%  Physical Exam Vitals and nursing note reviewed.  Constitutional:      Appearance: She is well-developed. She is not ill-appearing.  HENT:     Head: Normocephalic and atraumatic.     Mouth/Throat:     Mouth: Mucous membranes are dry.  Eyes:     Pupils: Pupils are equal, round, and reactive  to light.  Cardiovascular:     Rate and Rhythm: Normal rate and regular rhythm.     Heart sounds: Normal heart sounds.  Pulmonary:     Effort: Pulmonary effort is normal. No respiratory distress.     Breath sounds: No wheezing.  Abdominal:     General: Bowel sounds are normal. There is no distension.     Palpations: Abdomen is soft.     Tenderness: There is no abdominal tenderness. There is no guarding or rebound.  Musculoskeletal:     Cervical back: Neck supple.  Skin:    General: Skin is warm and dry.  Neurological:     Mental Status: She is alert and oriented to person, place, and time.  Psychiatric:        Mood and Affect: Mood  normal.     ED Results / Procedures / Treatments   Labs (all labs ordered are listed, but only abnormal results are displayed) Labs Reviewed  CBC WITH DIFFERENTIAL/PLATELET - Abnormal; Notable for the following components:      Result Value   WBC 15.2 (*)    Neutro Abs 13.0 (*)    Monocytes Absolute 1.4 (*)    All other components within normal limits  BASIC METABOLIC PANEL - Abnormal; Notable for the following components:   Sodium 132 (*)    Potassium 3.0 (*)    Chloride 92 (*)    Glucose, Bld 164 (*)    BUN 26 (*)    Creatinine, Ser 1.46 (*)    Calcium 8.8 (*)    GFR, Estimated 37 (*)    All other components within normal limits  URINALYSIS, ROUTINE W REFLEX MICROSCOPIC - Abnormal; Notable for the following components:   APPearance HAZY (*)    Hgb urine dipstick SMALL (*)    Protein, ur 30 (*)    Leukocytes,Ua LARGE (*)    WBC, UA >50 (*)    Bacteria, UA MANY (*)    All other components within normal limits  RESP PANEL BY RT-PCR (RSV, FLU A&B, COVID)  RVPGX2  URINE CULTURE    EKG None  Radiology DG Abdomen 1 View  Result Date: 01/03/2022 CLINICAL DATA:  Nausea EXAM: ABDOMEN - 1 VIEW COMPARISON:  None Available. FINDINGS: The bowel gas pattern is normal. No radio-opaque calculi or other significant radiographic abnormality are seen. IMPRESSION: Negative. Electronically Signed   By: Ulyses Jarred M.D.   On: 01/03/2022 02:00    Procedures .Critical Care  Performed by: Merryl Hacker, MD Authorized by: Merryl Hacker, MD   Critical care provider statement:    Critical care time (minutes):  35   Critical care was necessary to treat or prevent imminent or life-threatening deterioration of the following conditions:  Dehydration and metabolic crisis   Critical care was time spent personally by me on the following activities:  Development of treatment plan with patient or surrogate, discussions with consultants, evaluation of patient's response to treatment,  examination of patient, ordering and review of laboratory studies, ordering and review of radiographic studies, ordering and performing treatments and interventions, pulse oximetry, re-evaluation of patient's condition and review of old charts     Medications Ordered in ED Medications  sodium chloride 0.9 % bolus 1,000 mL (0 mLs Intravenous Stopped 01/03/22 0318)  potassium chloride 10 mEq in 100 mL IVPB (0 mEq Intravenous Stopped 01/03/22 0318)  potassium chloride SA (KLOR-CON M) CR tablet 40 mEq (40 mEq Oral Given 01/03/22 0159)  cefTRIAXone (ROCEPHIN) 1 g  in sodium chloride 0.9 % 100 mL IVPB (0 g Intravenous Stopped 01/03/22 0404)    ED Course/ Medical Decision Making/ A&P                           Medical Decision Making Amount and/or Complexity of Data Reviewed Labs: ordered. Radiology: ordered.  Risk Prescription drug management.   This patient presents to the ED for concern of nausea, this involves an extensive number of treatment options, and is a complaint that carries with it a high risk of complications and morbidity.  I considered the following differential and admission for this acute, potentially life threatening condition.  The differential diagnosis includes adverse medication effect, gastritis, gastroenteritis, systemic illness such as infection, less likely SBO  MDM:    This is a 76 year old female who presents with 10-day history of worsening nausea and decreased oral intake.  She appears somewhat dehydrated.  She is overall otherwise nontoxic-appearing.  Vital signs are fairly reassuring.  She is afebrile.  She has no significant abdominal tenderness or bloating.  Have low suspicion for SBO.  She reports that her primary physician was concerned that she may need a CT scan.  We discussed potential imaging; however, patient would like to defer CT imaging.  She does not feel like she has an obstruction.  Her only surgical history is cholecystectomy.  Will obtain an  abdominal x-ray.  Labs otherwise obtained and reviewed.  She has a leukocytosis to 15.  COVID and influenza testing are negative.  Metabolic panel is notable for potassium of 3.2.  Mild hyponatremia.  Creatinine of 1.46 which is slightly up from baseline.  Of note, urinalysis shows greater than 50 white cells and many bacteria with what appears to be a clean specimen.  I sent a urine culture.  Patient states that normally she gets urinary frequency when she has a UTI.  She has not been experiencing that but she has had some bilateral low back pain.  Will treat.  She was given a dose of Rocephin.  She was also given fluids for dehydration as well as potassium for hypokalemia.  Overall she states she feels much better.  Will discharge with Zofran, potassium, Keflex for UTI.  Will have her follow-up closely with her primary physician. (Labs, imaging, consults)  Labs: I Ordered, and personally interpreted labs.  The pertinent results include: CBC, BMP, urinalysis, COVID, influenza  Imaging Studies ordered: I ordered imaging studies including abdominal x-ray I independently visualized and interpreted imaging. I agree with the radiologist interpretation  Additional history obtained from daughter at bedside.  External records from outside source obtained and reviewed including prior evaluations  Cardiac Monitoring: The patient was maintained on a cardiac monitor.  I personally viewed and interpreted the cardiac monitored which showed an underlying rhythm of: Sinus rhythm  Reevaluation: After the interventions noted above, I reevaluated the patient and found that they have :improved  Social Determinants of Health:  lives independently  Disposition: Discharge  Co morbidities that complicate the patient evaluation  Past Medical History:  Diagnosis Date   Diabetes mellitus ([place])    Hypertension      Medicines Meds ordered this encounter  Medications   sodium chloride 0.9 % bolus 1,000 mL    potassium chloride 10 mEq in 100 mL IVPB   potassium chloride SA (KLOR-CON M) CR tablet 40 mEq   cefTRIAXone (ROCEPHIN) 1 g in sodium chloride 0.9 % 100 mL IVPB  Order Specific Question:   Antibiotic Indication:    Answer:   UTI   cephALEXin (KEFLEX) 500 MG capsule    Sig: Take 1 capsule (500 mg total) by mouth 3 (three) times daily.    Dispense:  21 capsule    Refill:  0   ondansetron (ZOFRAN-ODT) 4 MG disintegrating tablet    Sig: Take 1 tablet (4 mg total) by mouth every 8 (eight) hours as needed for nausea or vomiting.    Dispense:  20 tablet    Refill:  0   potassium chloride SA (KLOR-CON M) 20 MEQ tablet    Sig: Take 2 tablets (40 mEq total) by mouth daily.    Dispense:  10 tablet    Refill:  0    I have reviewed the patients home medicines and have made adjustments as needed  Problem List / ED Course: Problem List Items Addressed This Visit   None Visit Diagnoses     Nausea    -  Primary   Dehydration       Hypokalemia       Acute cystitis without hematuria                       Final Clinical Impression(s) / ED Diagnoses Final diagnoses:  Nausea  Dehydration  Hypokalemia  Acute cystitis without hematuria    Rx / DC Orders ED Discharge Orders          Ordered    cephALEXin (KEFLEX) 500 MG capsule  3 times daily        01/03/22 0453    ondansetron (ZOFRAN-ODT) 4 MG disintegrating tablet  Every 8 hours PRN        01/03/22 0453    potassium chloride SA (KLOR-CON M) 20 MEQ tablet  Daily        01/03/22 0453              Merryl Hacker, MD 01/03/22 [phone]

## 2022-01-04 ENCOUNTER — Emergency Department (HOSPITAL_COMMUNITY): Payer: Medicare HMO

## 2022-01-04 ENCOUNTER — Encounter (HOSPITAL_COMMUNITY): Payer: Self-pay | Admitting: Internal Medicine

## 2022-01-04 ENCOUNTER — Inpatient Hospital Stay (HOSPITAL_COMMUNITY)
Admission: EM | Admit: 2022-01-04 | Discharge: 2022-01-09 | DRG: 872 | Disposition: A | Discharge status: Home / Self Care | Payer: Medicare HMO | Attending: Internal Medicine | Admitting: Internal Medicine

## 2022-01-04 ENCOUNTER — Other Ambulatory Visit: Payer: Self-pay

## 2022-01-04 ENCOUNTER — Encounter: Payer: Self-pay | Admitting: Gastroenterology

## 2022-01-04 DIAGNOSIS — E785 Hyperlipidemia, unspecified: Secondary | ICD-10-CM | POA: Diagnosis present

## 2022-01-04 DIAGNOSIS — E119 Type 2 diabetes mellitus without complications: Secondary | ICD-10-CM

## 2022-01-04 DIAGNOSIS — Z7984 Long term (current) use of oral hypoglycemic drugs: Secondary | ICD-10-CM

## 2022-01-04 DIAGNOSIS — R6520 Severe sepsis without septic shock: Secondary | ICD-10-CM | POA: Diagnosis present

## 2022-01-04 DIAGNOSIS — Z888 Allergy status to other drugs, medicaments and biological substances status: Secondary | ICD-10-CM | POA: Diagnosis not present

## 2022-01-04 DIAGNOSIS — R109 Unspecified abdominal pain: Secondary | ICD-10-CM | POA: Diagnosis present

## 2022-01-04 DIAGNOSIS — E876 Hypokalemia: Secondary | ICD-10-CM | POA: Diagnosis not present

## 2022-01-04 DIAGNOSIS — N1832 Chronic kidney disease, stage 3b: Secondary | ICD-10-CM | POA: Diagnosis present

## 2022-01-04 DIAGNOSIS — R197 Diarrhea, unspecified: Secondary | ICD-10-CM | POA: Diagnosis present

## 2022-01-04 DIAGNOSIS — E860 Dehydration: Secondary | ICD-10-CM | POA: Diagnosis present

## 2022-01-04 DIAGNOSIS — Q6211 Congenital occlusion of ureteropelvic junction: Secondary | ICD-10-CM

## 2022-01-04 DIAGNOSIS — E1165 Type 2 diabetes mellitus with hyperglycemia: Secondary | ICD-10-CM | POA: Diagnosis present

## 2022-01-04 DIAGNOSIS — N136 Pyonephrosis: Secondary | ICD-10-CM | POA: Diagnosis present

## 2022-01-04 DIAGNOSIS — Z880 Allergy status to penicillin: Secondary | ICD-10-CM

## 2022-01-04 DIAGNOSIS — A419 Sepsis, unspecified organism: Secondary | ICD-10-CM | POA: Diagnosis not present

## 2022-01-04 DIAGNOSIS — R110 Nausea: Secondary | ICD-10-CM | POA: Diagnosis not present

## 2022-01-04 DIAGNOSIS — E11649 Type 2 diabetes mellitus with hypoglycemia without coma: Secondary | ICD-10-CM | POA: Diagnosis not present

## 2022-01-04 DIAGNOSIS — N12 Tubulo-interstitial nephritis, not specified as acute or chronic: Secondary | ICD-10-CM | POA: Diagnosis present

## 2022-01-04 DIAGNOSIS — Z1152 Encounter for screening for COVID-19: Secondary | ICD-10-CM

## 2022-01-04 DIAGNOSIS — E871 Hypo-osmolality and hyponatremia: Secondary | ICD-10-CM | POA: Diagnosis present

## 2022-01-04 DIAGNOSIS — N390 Urinary tract infection, site not specified: Secondary | ICD-10-CM | POA: Diagnosis present

## 2022-01-04 DIAGNOSIS — Z9049 Acquired absence of other specified parts of digestive tract: Secondary | ICD-10-CM | POA: Diagnosis not present

## 2022-01-04 DIAGNOSIS — I129 Hypertensive chronic kidney disease with stage 1 through stage 4 chronic kidney disease, or unspecified chronic kidney disease: Secondary | ICD-10-CM | POA: Diagnosis present

## 2022-01-04 DIAGNOSIS — I10 Essential (primary) hypertension: Secondary | ICD-10-CM | POA: Diagnosis present

## 2022-01-04 DIAGNOSIS — A4151 Sepsis due to Escherichia coli [E. coli]: Secondary | ICD-10-CM | POA: Diagnosis present

## 2022-01-04 DIAGNOSIS — Z7982 Long term (current) use of aspirin: Secondary | ICD-10-CM

## 2022-01-04 DIAGNOSIS — Z87440 Personal history of urinary (tract) infections: Secondary | ICD-10-CM | POA: Diagnosis not present

## 2022-01-04 DIAGNOSIS — N289 Disorder of kidney and ureter, unspecified: Secondary | ICD-10-CM | POA: Diagnosis present

## 2022-01-04 DIAGNOSIS — N179 Acute kidney failure, unspecified: Secondary | ICD-10-CM | POA: Diagnosis present

## 2022-01-04 LAB — CBC WITH DIFFERENTIAL/PLATELET
Abs Immature Granulocytes: 0.18 10*3/uL — ABNORMAL HIGH (ref 0.00–0.07)
Basophils Absolute: 0 10*3/uL (ref 0.0–0.1)
Basophils Relative: 0 %
Eosinophils Absolute: 0.1 10*3/uL (ref 0.0–0.5)
Eosinophils Relative: 1 %
HCT: 34.4 % — ABNORMAL LOW (ref 36.0–46.0)
Hemoglobin: 11.8 g/dL — ABNORMAL LOW (ref 12.0–15.0)
Immature Granulocytes: 1 %
Lymphocytes Relative: 3 %
Lymphs Abs: 0.5 10*3/uL — ABNORMAL LOW (ref 0.7–4.0)
MCH: 31.4 pg (ref 26.0–34.0)
MCHC: 34.3 g/dL (ref 30.0–36.0)
MCV: 91.5 fL (ref 80.0–100.0)
Monocytes Absolute: 0.9 10*3/uL (ref 0.1–1.0)
Monocytes Relative: 5 %
Neutro Abs: 15.4 10*3/uL — ABNORMAL HIGH (ref 1.7–7.7)
Neutrophils Relative %: 90 %
Platelets: 290 10*3/uL (ref 150–400)
RBC: 3.76 MIL/uL — ABNORMAL LOW (ref 3.87–5.11)
RDW: 12.3 % (ref 11.5–15.5)
WBC: 17.1 10*3/uL — ABNORMAL HIGH (ref 4.0–10.5)
nRBC: 0 % (ref 0.0–0.2)

## 2022-01-04 LAB — TROPONIN I (HIGH SENSITIVITY)
Troponin I (High Sensitivity): 11 ng/L (ref <18)
Troponin I (High Sensitivity): 12 ng/L (ref <18)

## 2022-01-04 LAB — COMPREHENSIVE METABOLIC PANEL
ALT: 22 U/L (ref 0–44)
AST: 23 U/L (ref 15–41)
Albumin: 2.9 g/dL — ABNORMAL LOW (ref 3.5–5.0)
Alkaline Phosphatase: 83 U/L (ref 38–126)
Anion gap: 12 (ref 5–15)
BUN: 26 mg/dL — ABNORMAL HIGH (ref 8–23)
CO2: 22 mmol/L (ref 22–32)
Calcium: 8.6 mg/dL — ABNORMAL LOW (ref 8.9–10.3)
Chloride: 98 mmol/L (ref 98–111)
Creatinine, Ser: 1.38 mg/dL — ABNORMAL HIGH (ref 0.44–1.00)
GFR, Estimated: 40 mL/min — ABNORMAL LOW (ref >60)
Glucose, Bld: 207 mg/dL — ABNORMAL HIGH (ref 70–99)
Potassium: 3.7 mmol/L (ref 3.5–5.1)
Sodium: 132 mmol/L — ABNORMAL LOW (ref 135–145)
Total Bilirubin: 2 mg/dL — ABNORMAL HIGH (ref 0.3–1.2)
Total Protein: 6.7 g/dL (ref 6.5–8.1)

## 2022-01-04 LAB — URINALYSIS, ROUTINE W REFLEX MICROSCOPIC
Bacteria, UA: NONE SEEN
Bilirubin Urine: NEGATIVE
Glucose, UA: NEGATIVE mg/dL
Hgb urine dipstick: NEGATIVE
Ketones, ur: 5 mg/dL — AB
Nitrite: NEGATIVE
Protein, ur: 100 mg/dL — AB
Specific Gravity, Urine: 1.017 (ref 1.005–1.030)
WBC, UA: >50 WBC/hpf — ABNORMAL HIGH (ref 0–5)
pH: 5 (ref 5.0–8.0)

## 2022-01-04 LAB — RESP PANEL BY RT-PCR (RSV, FLU A&B, COVID)  RVPGX2
Influenza A by PCR: NEGATIVE
Influenza B by PCR: NEGATIVE
Resp Syncytial Virus by PCR: NEGATIVE
SARS Coronavirus 2 by RT PCR: NEGATIVE

## 2022-01-04 LAB — MAGNESIUM: Magnesium: 1.6 mg/dL — ABNORMAL LOW (ref 1.7–2.4)

## 2022-01-04 LAB — CBG MONITORING, ED: Glucose-Capillary: 210 mg/dL — ABNORMAL HIGH (ref 70–99)

## 2022-01-04 LAB — LACTIC ACID, PLASMA
Lactic Acid, Venous: 2.3 mmol/L (ref 0.5–1.9)
Lactic Acid, Venous: 1.5 mmol/L (ref 0.5–1.9)

## 2022-01-04 MED ORDER — FENTANYL CITRATE PF 50 MCG/ML IJ SOSY
50.0000 ug | PREFILLED_SYRINGE | INTRAMUSCULAR | Status: DC | PRN
  Administered 2022-01-04 – 2022-01-05 (×3): 50 ug via INTRAVENOUS
  Filled 2022-01-04 (×3): qty 1

## 2022-01-04 MED ORDER — ACETAMINOPHEN 325 MG PO TABS: 650.0000 mg | ORAL_TABLET | Freq: Four times a day (QID) | ORAL | Status: DC | PRN

## 2022-01-04 MED ORDER — ACETAMINOPHEN 650 MG RE SUPP: 650.0000 mg | Freq: Four times a day (QID) | RECTAL | Status: DC | PRN

## 2022-01-04 MED ORDER — SODIUM CHLORIDE 0.9 % IV BOLUS
1000.0000 mL | Freq: Once | INTRAVENOUS | Status: AC
  Administered 2022-01-04: 1000 mL via INTRAVENOUS

## 2022-01-04 MED ORDER — LACTATED RINGERS IV SOLN: INTRAVENOUS | Status: AC

## 2022-01-04 MED ORDER — INSULIN ASPART 100 UNIT/ML IJ SOLN
0.0000 [IU] | Freq: Three times a day (TID) | INTRAMUSCULAR | Status: DC
  Administered 2022-01-05 (×2): 1 [IU] via SUBCUTANEOUS
  Filled 2022-01-04: qty 0.06

## 2022-01-04 MED ORDER — MELATONIN 3 MG PO TABS
3.0000 mg | ORAL_TABLET | Freq: Every evening | ORAL | Status: DC | PRN
  Administered 2022-01-05 – 2022-01-06 (×2): 3 mg via ORAL
  Filled 2022-01-04 (×2): qty 1

## 2022-01-04 MED ORDER — SODIUM CHLORIDE 0.9 % IV SOLN: 1.0000 g | INTRAVENOUS | Status: DC

## 2022-01-04 MED ORDER — SODIUM CHLORIDE 0.9 % IV SOLN
1.0000 g | Freq: Once | INTRAVENOUS | Status: AC
  Administered 2022-01-04: 1 g via INTRAVENOUS
  Filled 2022-01-04: qty 10

## 2022-01-04 MED ORDER — ONDANSETRON HCL 4 MG/2ML IJ SOLN
4.0000 mg | Freq: Once | INTRAMUSCULAR | Status: AC
  Administered 2022-01-04: 4 mg via INTRAVENOUS
  Filled 2022-01-04: qty 2

## 2022-01-04 MED ORDER — MAGNESIUM SULFATE 2 GM/50ML IV SOLN
2.0000 g | Freq: Once | INTRAVENOUS | Status: AC
  Administered 2022-01-04: 2 g via INTRAVENOUS
  Filled 2022-01-04: qty 50

## 2022-01-04 MED ORDER — IOHEXOL 300 MG/ML  SOLN
100.0000 mL | Freq: Once | INTRAMUSCULAR | Status: AC | PRN
  Administered 2022-01-04: 100 mL via INTRAVENOUS

## 2022-01-04 MED ORDER — NALOXONE HCL 0.4 MG/ML IJ SOLN: 0.4000 mg | INTRAMUSCULAR | Status: DC | PRN

## 2022-01-04 MED ORDER — ONDANSETRON HCL 4 MG/2ML IJ SOLN
4.0000 mg | Freq: Four times a day (QID) | INTRAMUSCULAR | Status: DC | PRN
  Administered 2022-01-05: 4 mg via INTRAVENOUS
  Filled 2022-01-04: qty 2

## 2022-01-04 NOTE — ED Provider Notes (Signed)
[facility] DEPT Provider Note   CSN: [id number] Arrival date & time: 01/04/22  1101     History  Chief Complaint  Patient presents with   Hyperglycemia   Weakness    Melissa Leon is a 76 y.o. female history of hypertension, diabetes, here presenting with vomiting and poor appetite.  Patient has been on his Ozempic since last year.  For the last months she has been having nausea.  She states that the nausea got worse enough that she stop taking the Ozempic.  She states that she is feeling weak and tired.  Patient was seen in [facility] 2 days ago and was diagnosed with UTI.  Patient was given a dose of Rocephin.  Her urine culture showed gram-negative rods.  Patient has been taking antibiotics as prescribed but has not been feeling well.  She has persistent nausea.  She also has persistent abdominal pain.  She has low-grade temperature at home.  Patient called her doctor and was sent here for further evaluation and in particular to rule out small bowel obstruction.    The history is provided by the patient.       Home Medications Prior to Admission medications   Medication Sig Start Date End Date Taking? Authorizing Provider  amLODipine (NORVASC) 5 MG tablet Take 5 mg by mouth daily. 03/27/21   [provider]  aspirin 81 MG chewable tablet Chew 81 mg by mouth daily.    [provider]  atorvastatin (LIPITOR) 10 MG tablet Take 10 mg by mouth at bedtime. 04/29/21   [provider]  cephALEXin (KEFLEX) 500 MG capsule Take 1 capsule (500 mg total) by mouth 3 (three) times daily. 01/03/22   Horton, Barbette Hair, MD  co-enzyme Q-10 30 MG capsule Take 1 capsule by mouth daily.    [provider]  glimepiride (AMARYL) 4 MG tablet Take 2 tablets by mouth daily. 05/12/21   [provider]  hydrochlorothiazide (HYDRODIURIL) 25 MG tablet Take 25 mg by mouth every morning. 04/21/21   [provider]  losartan  (COZAAR) 100 MG tablet Take 100 mg by mouth daily. 04/21/21   [provider]  metFORMIN (GLUCOPHAGE-XR) 500 MG 24 hr tablet Take 4 tablets by mouth daily as needed. 03/27/21   [provider]  ondansetron (ZOFRAN-ODT) 4 MG disintegrating tablet Take 1 tablet (4 mg total) by mouth every 8 (eight) hours as needed for nausea or vomiting. 01/03/22   Horton, Barbette Hair, MD  OZEMPIC, 0.25 OR 0.5 MG/DOSE, 2 MG/3ML SOPN Inject 0.5 mg into the skin once a week. 04/30/21   [provider]  potassium chloride SA (KLOR-CON M) 20 MEQ tablet Take 2 tablets (40 mEq total) by mouth daily. 01/03/22   Horton, Barbette Hair, MD      Allergies    Choline fenofibrate and Penicillins    Review of Systems   Review of Systems  Constitutional:  Positive for chills and fever.  Gastrointestinal:  Positive for nausea.  Neurological:  Positive for weakness.  All other systems reviewed and are negative.   Physical Exam Updated Vital Signs BP (!) 145/77 (BP Location: Left Arm)   Pulse 96   Temp 99 F (37.2 C) (Oral)   Resp 17   Ht '5\' 7"'$  (1.702 m)   Wt 84 kg   SpO2 95%   BMI 29.00 kg/m  Physical Exam Vitals and nursing note reviewed.  Constitutional:      Comments: Dehydrated, chronically ill  HENT:  Head: Normocephalic.     Nose: Nose normal.     Mouth/Throat:     Mouth: Mucous membranes are dry.  Eyes:     Extraocular Movements: Extraocular movements intact.     Pupils: Pupils are equal, round, and reactive to light.  Cardiovascular:     Rate and Rhythm: Normal rate and regular rhythm.     Pulses: Normal pulses.     Heart sounds: Normal heart sounds.  Pulmonary:     Effort: Pulmonary effort is normal.     Breath sounds: Normal breath sounds.  Abdominal:     General: Abdomen is flat.     Palpations: Abdomen is soft.     Comments: Mild epigastric tenderness and mild bilateral CVA tenderness  Musculoskeletal:        General: Normal range of motion.     Cervical back:  Normal range of motion and neck supple.  Skin:    General: Skin is warm.     Capillary Refill: Capillary refill takes less than 2 seconds.  Neurological:     General: No focal deficit present.     Mental Status: She is oriented to person, place, and time.  Psychiatric:        Mood and Affect: Mood normal.        Behavior: Behavior normal.     ED Results / Procedures / Treatments   Labs (all labs ordered are listed, but only abnormal results are displayed) Labs Reviewed  COMPREHENSIVE METABOLIC PANEL - Abnormal; Notable for the following components:      Result Value   Sodium 132 (*)    Glucose, Bld 207 (*)    BUN 26 (*)    Creatinine, Ser 1.38 (*)    Calcium 8.6 (*)    Albumin 2.9 (*)    Total Bilirubin 2.0 (*)    GFR, Estimated 40 (*)    All other components within normal limits  CBC WITH DIFFERENTIAL/PLATELET - Abnormal; Notable for the following components:   WBC 17.1 (*)    RBC 3.76 (*)    Hemoglobin 11.8 (*)    HCT 34.4 (*)    Neutro Abs 15.4 (*)    Lymphs Abs 0.5 (*)    Abs Immature Granulocytes 0.18 (*)    All other components within normal limits  CBG MONITORING, ED - Abnormal; Notable for the following components:   Glucose-Capillary 210 (*)    All other components within normal limits  URINE CULTURE  CULTURE, BLOOD (ROUTINE X 2)  CULTURE, BLOOD (ROUTINE X 2)  RESP PANEL BY RT-PCR (RSV, FLU A&B, COVID)  RVPGX2  URINALYSIS, ROUTINE W REFLEX MICROSCOPIC  LACTIC ACID, PLASMA  LACTIC ACID, PLASMA  TROPONIN I (HIGH SENSITIVITY)    EKG EKG Interpretation  Date/Time:  Sunday January 04 2022 13:07:33 EST Ventricular Rate:  99 PR Interval:  178 QRS Duration: 91 QT Interval:  340 QTC Calculation: 437 R Axis:   34 Text Interpretation: Sinus rhythm Low voltage, precordial leads No significant change since last tracing Confirmed by Wandra Arthurs [phone]) on 01/04/2022 4:37:31 PM  Radiology DG Abdomen 1 View  Result Date: 01/03/2022 CLINICAL DATA:  Nausea  EXAM: ABDOMEN - 1 VIEW COMPARISON:  None Available. FINDINGS: The bowel gas pattern is normal. No radio-opaque calculi or other significant radiographic abnormality are seen. IMPRESSION: Negative. Electronically Signed   By: Ulyses Jarred M.D.   On: 01/03/2022 02:00    Procedures Procedures    Medications Ordered in ED Medications  sodium  chloride 0.9 % bolus 1,000 mL (has no administration in time range)  cefTRIAXone (ROCEPHIN) 1 g in sodium chloride 0.9 % 100 mL IVPB (has no administration in time range)  ondansetron (ZOFRAN) injection 4 mg (has no administration in time range)    ED Course/ Medical Decision Making/ A&P                           Medical Decision Making Rupinder MADISSEN WYSE is a 76 y.o. female on Ozempic here presenting nausea and vomiting.  Patient was diagnosed with UTI recently and there is already on antibiotics.  Concern for possible pyelonephritis versus small bowel obstruction versus colitis.  Plan to get CBC and CMP and CT abdomen pelvis and urinalysis  6:22 PM Patient's white blood count increased to 17.  Lactate is elevated at 2.3.  UA showed large leuk.  CT showed severe hydro on the right side.  Patient has no obvious obstructing stone.  Patient has perinephric fat stranding from likely pyelonephritis.  At this point, patient will need to be admitted for pyelonephritis.  I consulted urologist, Dr. Barbee Cough, regarding the patient.  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  She had no previous CT abdomen pelvis to compare.  He states that there is no an emergent intervention that is needed right now.  He states that he will see patient as a consult and patient can be admitted to the hospital service for IV antibiotics.   Problems Addressed: Hydronephrosis with ureteropelvic junction (UPJ) obstruction: acute illness or injury Pyelonephritis: acute illness or injury  Amount and/or Complexity of Data Reviewed Labs: ordered. Decision-making  details documented in ED Course. Radiology: ordered and independent interpretation performed. Decision-making details documented in ED Course.  Risk Prescription drug management. Decision regarding hospitalization.    Final Clinical Impression(s) / ED Diagnoses Final diagnoses:  None    Rx / DC Orders ED Discharge Orders     None         Drenda Freeze, MD 01/04/22 [phone]

## 2022-01-04 NOTE — ED Provider Triage Note (Signed)
Emergency Medicine Provider Triage Evaluation Note  Melissa Leon , a 76 y.o. female  was evaluated in triage.  Pt complains of not feeling well. Endorses nausea and was seen by Dr. Dina Rich yesterday w/dx of UTI. Has been taking abx but still "doesn't feel good." No vomiting. Poor appetitex1 week, just got off ozempic  Review of Systems  Positive: nausea Negative: Abdominal pain  Physical Exam  BP 130/68 (BP Location: Right Arm)   Pulse (!) 106   Temp (!) 97.5 F (36.4 C) (Oral)   Resp 16   Ht '5\' 7"'$  (1.702 m)   Wt 84 kg   SpO2 97%   BMI 29.00 kg/m  Gen:   Awake, no distress  Resp:  Normal effort  MSK:   Moves extremities without difficulty  Other:  Nondistended abd  Medical Decision Making  Medically screening exam initiated at 11:52 AM.  Appropriate orders placed.  Melissa Leon was informed that the remainder of the evaluation will be completed by another provider, this initial triage assessment does not replace that evaluation, and the importance of remaining in the ED until their evaluation is complete.     Osvaldo Shipper, [place] 01/04/22 1153

## 2022-01-04 NOTE — ED Triage Notes (Signed)
Patient came off of Ozempic Friday d/t SE of nausea, weakness and fatigue, decreased appetite. Went to [facility] for nausea, dx w/ cystitis. Since this discharge she has been feeling unwell still, her blood sugars have been elevated as well.

## 2022-01-04 NOTE — Consult Note (Cosign Needed)
Urology Consult   Physician requesting consult: Drenda Freeze, MD  Reason for consult: right hydronephrosis  History of Present Illness: Melissa Leon is a 76 y.o. female with a PMH of DM and HTN who presented to the [facility] ED with nausea and abdominal/flank pain for one week. Patient reports nausea has been long standing and maybe related to her Ozempic, though it was so bad this past week that she has had very poor PO intake. She has only been able to tolerate small amounts of fluids. On my discussion with her, she denied emesis. She also endorses weakness and fatigue. She presented to an ED yesterday with similar symptoms and was diagnosed with a UTI and discharge on a course of Keflex. When symptoms did not improve she presented to [facility] ED.  Patient denies fevers, chills. She endorses a history of recurrent UTIs in her 65s. She denies having these worked up further, she was only treated with antibiotics when they occurred. At present, she denies dysuria, frequency, urgency, or hematuria. She is not aware of a history of additional GU issues. Denies nephrolithiasis, CKD.  Past Medical History:  Diagnosis Date   Diabetes mellitus ([place])    Hypertension     Past Surgical History:  Procedure Laterality Date   APPENDECTOMY     APPENDECTOMY     CHOLECYSTECTOMY       Current Hospital Medications:  Home meds:  No current facility-administered medications on file prior to encounter.   Current Outpatient Medications on File Prior to Encounter  Medication Sig Dispense Refill   amLODipine (NORVASC) 5 MG tablet Take 5 mg by mouth daily.     aspirin 81 MG chewable tablet Chew 81 mg by mouth daily.     atorvastatin (LIPITOR) 10 MG tablet Take 10 mg by mouth at bedtime.     cephALEXin (KEFLEX) 500 MG capsule Take 1 capsule (500 mg total) by mouth 3 (three) times daily. 21 capsule 0   co-enzyme Q-10 30 MG capsule Take 1 capsule by mouth daily.     glimepiride (AMARYL) 4 MG tablet Take  2 tablets by mouth daily.     hydrochlorothiazide (HYDRODIURIL) 25 MG tablet Take 25 mg by mouth every morning.     losartan (COZAAR) 100 MG tablet Take 100 mg by mouth daily.     metFORMIN (GLUCOPHAGE-XR) 500 MG 24 hr tablet Take 4 tablets by mouth daily as needed.     ondansetron (ZOFRAN-ODT) 4 MG disintegrating tablet Take 1 tablet (4 mg total) by mouth every 8 (eight) hours as needed for nausea or vomiting. 20 tablet 0   OZEMPIC, 0.25 OR 0.5 MG/DOSE, 2 MG/3ML SOPN Inject 0.5 mg into the skin once a week.     potassium chloride SA (KLOR-CON M) 20 MEQ tablet Take 2 tablets (40 mEq total) by mouth daily. 10 tablet 0     Scheduled Meds:  [START ON 01/05/2022] insulin aspart  0-6 Units Subcutaneous TID WC   Continuous Infusions:  [START ON 01/05/2022] cefTRIAXone (ROCEPHIN)  IV     lactated ringers 100 mL/hr at 01/04/22 2005   PRN Meds:.acetaminophen **OR** acetaminophen, fentaNYL (SUBLIMAZE) injection, melatonin, naLOXone (NARCAN)  injection, ondansetron (ZOFRAN) IV  Allergies:  Allergies  Allergen Reactions   Choline Fenofibrate Other (See Comments)    ELEVATED LFT'S    Penicillins Hives    Family History  Problem Relation Age of Onset   Breast cancer Neg Hx     Social History:  reports that she has  never smoked. She has never used smokeless tobacco. She reports current alcohol use. She reports that she does not use drugs.  ROS: A complete review of systems was performed.  All systems are negative except for pertinent findings as noted.  Physical Exam:  Vital signs in last 24 hours: Temp:  [97.5 F (36.4 C)-99 F (37.2 C)] 99 F (37.2 C) (12/17 1813) Pulse Rate:  [96-110] 107 (12/17 1930) Resp:  [16-24] 22 (12/17 1930) BP: (128-150)/(61-77) 144/67 (12/17 1930) SpO2:  [95 %-98 %] 97 % (12/17 1930) Weight:  [84 kg] 84 kg (12/17 1144) Constitutional:  Alert and oriented, No acute distress Cardiovascular: Regular rate and rhythm, No JVD Respiratory: Normal respiratory  effort GI: Abdomen is soft, nontender, nondistended, no abdominal masses GU: No CVA tenderness bilaterally. Voiding spontaneously Lymphatic: No lymphadenopathy Neurologic: Grossly intact, no focal deficits Psychiatric: Normal mood and affect  Laboratory Data:  Recent Labs    01/02/22 1859 01/04/22 1257  WBC 15.2* 17.1*  HGB 12.5 11.8*  HCT 36.0 34.4*  PLT 292 290    Recent Labs    01/02/22 1859 01/04/22 1257  NA 132* 132*  K 3.0* 3.7  CL 92* 98  GLUCOSE 164* 207*  BUN 26* 26*  CALCIUM 8.8* 8.6*  CREATININE 1.46* 1.38*     Results for orders placed or performed during the hospital encounter of 01/04/22 (from the past 24 hour(s))  Comprehensive metabolic panel     Status: Abnormal   Collection Time: 01/04/22 12:57 PM  Result Value Ref Range   Sodium 132 (L) 135 - 145 mmol/L   Potassium 3.7 3.5 - 5.1 mmol/L   Chloride 98 98 - 111 mmol/L   CO2 22 22 - 32 mmol/L   Glucose, Bld 207 (H) 70 - 99 mg/dL   BUN 26 (H) 8 - 23 mg/dL   Creatinine, Ser 1.38 (H) 0.44 - 1.00 mg/dL   Calcium 8.6 (L) 8.9 - 10.3 mg/dL   Total Protein 6.7 6.5 - 8.1 g/dL   Albumin 2.9 (L) 3.5 - 5.0 g/dL   AST 23 15 - 41 U/L   ALT 22 0 - 44 U/L   Alkaline Phosphatase 83 38 - 126 U/L   Total Bilirubin 2.0 (H) 0.3 - 1.2 mg/dL   GFR, Estimated 40 (L) >60 mL/min   Anion gap 12 5 - 15  CBC with Differential     Status: Abnormal   Collection Time: 01/04/22 12:57 PM  Result Value Ref Range   WBC 17.1 (H) 4.0 - 10.5 K/uL   RBC 3.76 (L) 3.87 - 5.11 MIL/uL   Hemoglobin 11.8 (L) 12.0 - 15.0 g/dL   HCT 34.4 (L) 36.0 - 46.0 %   MCV 91.5 80.0 - 100.0 fL   MCH 31.4 26.0 - 34.0 pg   MCHC 34.3 30.0 - 36.0 g/dL   RDW 12.3 11.5 - 15.5 %   Platelets 290 150 - 400 K/uL   nRBC 0.0 0.0 - 0.2 %   Neutrophils Relative % 90 %   Neutro Abs 15.4 (H) 1.7 - 7.7 K/uL   Lymphocytes Relative 3 %   Lymphs Abs 0.5 (L) 0.7 - 4.0 K/uL   Monocytes Relative 5 %   Monocytes Absolute 0.9 0.1 - 1.0 K/uL   Eosinophils Relative 1  %   Eosinophils Absolute 0.1 0.0 - 0.5 K/uL   Basophils Relative 0 %   Basophils Absolute 0.0 0.0 - 0.1 K/uL   Immature Granulocytes 1 %   Abs Immature Granulocytes 0.18 (  H) 0.00 - 0.07 K/uL  POC CBG, ED     Status: Abnormal   Collection Time: 01/04/22  1:00 PM  Result Value Ref Range   Glucose-Capillary 210 (H) 70 - 99 mg/dL  Urinalysis, Routine w reflex microscopic Urine, Clean Catch     Status: Abnormal   Collection Time: 01/04/22  4:37 PM  Result Value Ref Range   Color, Urine YELLOW YELLOW   APPearance HAZY (A) CLEAR   Specific Gravity, Urine 1.017 1.005 - 1.030   pH 5.0 5.0 - 8.0   Glucose, UA NEGATIVE NEGATIVE mg/dL   Hgb urine dipstick NEGATIVE NEGATIVE   Bilirubin Urine NEGATIVE NEGATIVE   Ketones, ur 5 (A) NEGATIVE mg/dL   Protein, ur 100 (A) NEGATIVE mg/dL   Nitrite NEGATIVE NEGATIVE   Leukocytes,Ua LARGE (A) NEGATIVE   RBC / HPF 0-5 0 - 5 RBC/hpf   WBC, UA >50 (H) 0 - 5 WBC/hpf   Bacteria, UA NONE SEEN NONE SEEN   Squamous Epithelial / LPF 0-5 0 - 5   Mucus PRESENT   Lactic acid, plasma     Status: Abnormal   Collection Time: 01/04/22  5:14 PM  Result Value Ref Range   Lactic Acid, Venous 2.3 (HH) 0.5 - 1.9 mmol/L  Troponin I (High Sensitivity)     Status: None   Collection Time: 01/04/22  5:14 PM  Result Value Ref Range   Troponin I (High Sensitivity) 11 <18 ng/L  Resp panel by RT-PCR (RSV, Flu A&B, Covid) Anterior Nasal Swab     Status: None   Collection Time: 01/04/22  5:14 PM   Specimen: Anterior Nasal Swab  Result Value Ref Range   SARS Coronavirus 2 by RT PCR NEGATIVE NEGATIVE   Influenza A by PCR NEGATIVE NEGATIVE   Influenza B by PCR NEGATIVE NEGATIVE   Resp Syncytial Virus by PCR NEGATIVE NEGATIVE  Lactic acid, plasma     Status: None   Collection Time: 01/04/22  7:56 PM  Result Value Ref Range   Lactic Acid, Venous 1.5 0.5 - 1.9 mmol/L  Troponin I (High Sensitivity)     Status: None   Collection Time: 01/04/22  7:56 PM  Result Value Ref Range    Troponin I (High Sensitivity) 12 <18 ng/L  Magnesium     Status: Abnormal   Collection Time: 01/04/22  7:56 PM  Result Value Ref Range   Magnesium 1.6 (L) 1.7 - 2.4 mg/dL   Recent Results (from the past 240 hour(s))  Resp panel by RT-PCR (RSV, Flu A&B, Covid) Anterior Nasal Swab     Status: None   Collection Time: 01/02/22  6:59 PM   Specimen: Anterior Nasal Swab  Result Value Ref Range Status   SARS Coronavirus 2 by RT PCR NEGATIVE NEGATIVE Final    Comment: (NOTE) SARS-CoV-2 target nucleic acids are NOT DETECTED.  The SARS-CoV-2 RNA is generally detectable in upper respiratory specimens during the acute phase of infection. The lowest concentration of SARS-CoV-2 viral copies this assay can detect is 138 copies/mL. A negative result does not preclude SARS-Cov-2 infection and should not be used as the sole basis for treatment or other patient management decisions. A negative result may occur with  improper specimen collection/handling, submission of specimen other than nasopharyngeal swab, presence of viral mutation(s) within the areas targeted by this assay, and inadequate number of viral copies(<138 copies/mL). A negative result must be combined with clinical observations, patient history, and epidemiological information. The expected result is Negative.  Fact  Sheet for Patients:  EntrepreneurPulse.com.au  Fact Sheet for Healthcare Providers:  IncredibleEmployment.be  This test is no t yet approved or cleared by the [place] FDA and  has been authorized for detection and/or diagnosis of SARS-CoV-2 by FDA under an Emergency Use Authorization (EUA). This EUA will remain  in effect (meaning this test can be used) for the duration of the COVID-19 declaration under Section 564(b)(1) of the Act, 21 U.S.C.section 360bbb-3(b)(1), unless the authorization is terminated  or revoked sooner.       Influenza A by PCR NEGATIVE NEGATIVE Final    Influenza B by PCR NEGATIVE NEGATIVE Final    Comment: (NOTE) The Xpert Xpress SARS-CoV-2/FLU/RSV plus assay is intended as an aid in the diagnosis of influenza from Nasopharyngeal swab specimens and should not be used as a sole basis for treatment. Nasal washings and aspirates are unacceptable for Xpert Xpress SARS-CoV-2/FLU/RSV testing.  Fact Sheet for Patients: EntrepreneurPulse.com.au  Fact Sheet for Healthcare Providers: IncredibleEmployment.be  This test is not yet approved or cleared by the [place] FDA and has been authorized for detection and/or diagnosis of SARS-CoV-2 by FDA under an Emergency Use Authorization (EUA). This EUA will remain in effect (meaning this test can be used) for the duration of the COVID-19 declaration under Section 564(b)(1) of the Act, 21 U.S.C. section 360bbb-3(b)(1), unless the authorization is terminated or revoked.     Resp Syncytial Virus by PCR NEGATIVE NEGATIVE Final    Comment: (NOTE) Fact Sheet for Patients: EntrepreneurPulse.com.au  Fact Sheet for Healthcare Providers: IncredibleEmployment.be  This test is not yet approved or cleared by the [place] FDA and has been authorized for detection and/or diagnosis of SARS-CoV-2 by FDA under an Emergency Use Authorization (EUA). This EUA will remain in effect (meaning this test can be used) for the duration of the COVID-19 declaration under Section 564(b)(1) of the Act, 21 U.S.C. section 360bbb-3(b)(1), unless the authorization is terminated or revoked.  Performed at [facility], [address] [place], [place] [address]   Urine Culture     Status: Abnormal (Preliminary result)   Collection Time: 01/02/22  6:59 PM   Specimen: Urine, Clean Catch  Result Value Ref Range Status   Specimen Description   Final    URINE, CLEAN CATCH Performed at [facility], [address], [place], [place] [address]    Special Requests   Final    NONE Performed at [facility], [address] [place], [place] [address]    Culture (A)  Final    >=100,000 COLONIES/mL ESCHERICHIA COLI SUSCEPTIBILITIES TO FOLLOW Performed at [facility] [address] [place], [place] [address]    Report Status PENDING  Incomplete  Resp panel by RT-PCR (RSV, Flu A&B, Covid) Anterior Nasal Swab     Status: None   Collection Time: 01/04/22  5:14 PM   Specimen: Anterior Nasal Swab  Result Value Ref Range Status   SARS Coronavirus 2 by RT PCR NEGATIVE NEGATIVE Final    Comment: (NOTE) SARS-CoV-2 target nucleic acids are NOT DETECTED.  The SARS-CoV-2 RNA is generally detectable in upper respiratory specimens during the acute phase of infection. The lowest concentration of SARS-CoV-2 viral copies this assay can detect is 138 copies/mL. A negative result does not preclude SARS-Cov-2 infection and should not be used as the sole basis for treatment or other patient management decisions. A negative result may occur with  improper specimen collection/handling, submission of specimen other than nasopharyngeal swab, presence  of viral mutation(s) within the areas targeted by this assay, and inadequate number of viral copies(<138 copies/mL). A negative result must be combined with clinical observations, patient history, and epidemiological information. The expected result is Negative.  Fact Sheet for Patients:  EntrepreneurPulse.com.au  Fact Sheet for Healthcare Providers:  IncredibleEmployment.be  This test is no t yet approved or cleared by the [place] FDA and  has been authorized for detection and/or diagnosis of SARS-CoV-2 by FDA under an Emergency Use Authorization (EUA). This EUA will remain  in effect (meaning this test can be used) for the duration of the COVID-19 declaration under Section 564(b)(1) of  the Act, 21 U.S.C.section 360bbb-3(b)(1), unless the authorization is terminated  or revoked sooner.       Influenza A by PCR NEGATIVE NEGATIVE Final   Influenza B by PCR NEGATIVE NEGATIVE Final    Comment: (NOTE) The Xpert Xpress SARS-CoV-2/FLU/RSV plus assay is intended as an aid in the diagnosis of influenza from Nasopharyngeal swab specimens and should not be used as a sole basis for treatment. Nasal washings and aspirates are unacceptable for Xpert Xpress SARS-CoV-2/FLU/RSV testing.  Fact Sheet for Patients: EntrepreneurPulse.com.au  Fact Sheet for Healthcare Providers: IncredibleEmployment.be  This test is not yet approved or cleared by the [place] FDA and has been authorized for detection and/or diagnosis of SARS-CoV-2 by FDA under an Emergency Use Authorization (EUA). This EUA will remain in effect (meaning this test can be used) for the duration of the COVID-19 declaration under Section 564(b)(1) of the Act, 21 U.S.C. section 360bbb-3(b)(1), unless the authorization is terminated or revoked.     Resp Syncytial Virus by PCR NEGATIVE NEGATIVE Final    Comment: (NOTE) Fact Sheet for Patients: EntrepreneurPulse.com.au  Fact Sheet for Healthcare Providers: IncredibleEmployment.be  This test is not yet approved or cleared by the [place] FDA and has been authorized for detection and/or diagnosis of SARS-CoV-2 by FDA under an Emergency Use Authorization (EUA). This EUA will remain in effect (meaning this test can be used) for the duration of the COVID-19 declaration under Section 564(b)(1) of the Act, 21 U.S.C. section 360bbb-3(b)(1), unless the authorization is terminated or revoked.  Performed at [facility], [place] [address] [place], [place] [address]     Renal Function: Recent Labs    01/02/22 1859 01/04/22 1257  CREATININE 1.46* 1.38*   Estimated  Creatinine Clearance: 38.7 mL/min (A) (by C-G formula based on SCr of 1.38 mg/dL (H)).  Radiologic Imaging: CT ABDOMEN PELVIS W CONTRAST  Result Date: 01/04/2022 CLINICAL DATA:  Sepsis weakness EXAM: CT ABDOMEN AND PELVIS WITH CONTRAST TECHNIQUE: Multidetector CT imaging of the abdomen and pelvis was performed using the standard protocol following bolus administration of intravenous contrast. RADIATION DOSE REDUCTION: This exam was performed according to the departmental dose-optimization program which includes automated exposure control, adjustment of the mA and/or kV according to patient size and/or use of iterative reconstruction technique. CONTRAST:  13m OMNIPAQUE IOHEXOL 300 MG/ML  SOLN COMPARISON:  Radiograph 01/04/2019 FINDINGS: Lower chest: Lung bases demonstrate no acute airspace disease. Hepatobiliary: Nonvisualized gallbladder likely due to prior cholecystectomy. Prominent extrahepatic common bile duct measures up to 16 mm. No intra hepatic biliary dilatation. Pancreas: Unremarkable. No pancreatic ductal dilatation or surrounding inflammatory changes. Spleen: Normal in size without focal abnormality. Adrenals/Urinary Tract: Adrenal glands are normal. Small left kidney stone versus mild intrarenal vascular calcification. Severe hydronephrosis of the right kidney without hydroureter. Mildly dense debris within the right renal collecting system posteriorly, series 2,  image 50. negative for ureteral stone. Bladder is normal. Stranding at the right renal pelvis. Mild bilateral perinephric nonspecific stranding. 10 mm possible indeterminate lesion mid pole left kidney, series 2, image 37. No significant excretion of contrast from right kidney on delayed views. Stomach/Bowel: The stomach is nonenlarged. No dilated small bowel. No acute bowel wall thickening. Vascular/Lymphatic: Moderate severe aortic atherosclerosis. No aneurysm. No suspicious lymph nodes. Mass effect on IVC by large right renal pelvis  but with flow enhancement present. Reproductive: Uterus and bilateral adnexa are unremarkable. Other: Negative for pelvic effusion or free Musculoskeletal: Degenerative changes of the lumbar spine. No acute osseous abnormality IMPRESSION: 1. 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 Nonspecific perinephric fat stranding though moderate stranding adjacent to the right renal pelvis which could be due to infection or inflammation. No apparent contrast excretion from right kidney on delayed views consistent with decreased renal function. 2. Small nonobstructing left kidney stone. 3. 10 mm indeterminate lesion mid pole left kidney. When the patient is clinically stable and able to follow directions and hold their breath (preferably as an outpatient) further evaluation with dedicated abdominal MRI should be considered. 4. Prominent extrahepatic common bile duct likely due to prior cholecystectomy. Correlate with LFTs. 5. Aortic atherosclerosis. Aortic Atherosclerosis (ICD10-I70.0). Electronically Signed   By: Donavan Foil M.D.   On: 01/04/2022 18:15   DG Abdomen 1 View  Result Date: 01/03/2022 CLINICAL DATA:  Nausea EXAM: ABDOMEN - 1 VIEW COMPARISON:  None Available. FINDINGS: The bowel gas pattern is normal. No radio-opaque calculi or other significant radiographic abnormality are seen. IMPRESSION: Negative. Electronically Signed   By: Ulyses Jarred M.D.   On: 01/03/2022 02:00    I independently reviewed the above imaging studies.  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 Ideally CTU and lasix renogram to better characterize anatomy and to delineate right kidney split function In the absence of fevers and hemodynamic instability,  no indication for short-term drainage with stent or PCN at this time. If patient was to clinically decompensate, become febrile, or fail to improve on medical management, would consider drainage with a R PCN. PCN would be preferred to facilitate future work-up and long-term intervention such as lasix renogram and possible pyeloplasty  UTI Currently on CTX Follow-up cultures, cater accordingly Recommend 10-14 day course for complicated UTI AKI Likely multifactorial in the setting of obstruction and dehydration from poor PO intake over last week Encourage PO intake when possible and supplement with mIVF Trend Cr, monitor I/Os  Legrand Como Elyan Vanwieren 01/04/2022, 9:30 PM   Patient presented with nausea, poor appetite, vague right flank pain over the past week.  In general, she does not feel well and feels lousy. 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  She denies prior known history of this.  She has a remote history of recurrent urinary tract infections however none recent.  Right UPJ obstruction  UTI AKI  -Given persistent pain, nausea, increased leukocytosis and worsening AKI, she would like to proceed with right PCN.  Discussed that this will allow [place] to obtain Lasix renal scan and if she opts for pyeloplasty, will allow for decreased inflammation to facilitate repair.  Will discuss with IR. -Continue antibiotics.  Tailor once sensitivities result. -Trend creatinine.  Likely to improve after PCN placement.  Matt R[facility]  Pager:  [phone]    

## 2022-01-04 NOTE — H&P (Signed)
History and Physical      Melissa Leon BLT:[id number] DOB: [birth year] DOA: 01/04/2022  PCP: Katherina Mires, MD  Patient coming from: home   I have personally briefly reviewed patient's old medical records in [place]  Chief Complaint: right flank pain  HPI: Melissa Leon is a 76 y.o. female with medical history significant for type 2 diabetes mellitus, essential hypertension who is admitted to [facility] on 01/04/2022 with severe sepsis due to right-sided pyelonephritis after presenting from home to [facility] ED complaining of right flank pain.  The patient reports approximately 2 weeks of progressive sharp, nonradiating right flank discomfort associated with intermittent nausea in the absence of vomiting.  This is also been associated with subjective fever, in the absence of chills, full body rigors or generalized myalgias.  Denies any associated dysuria or gross hematuria.  In the setting of type 2 diabetes mellitus, she notes that she was recently started on Ozempic, and is noted ensuing intermittent loose stool less than 1 episode every 1 to 2 days, and notes that this frequency is further decreasing over the last week.  Not associated any melena or hematochezia.  The last 2 weeks however, she has noted significant decline in oral intake of both food and water as a consequence of her intermittent nausea.  No recent traveling or trauma.  No associated recent chest pain, palpitations, diaphoresis, shortness of breath, cough.  No rash, headache, neck stiffness.   In the setting of the above symptoms, she presented to [facility] emergency department on 01/02/22 at which time she was diagnosed with urinary tract infection.  She received a dose of Rocephin and was discharged home from the ED on Keflex.  ED workup at that time included plain films of the abdomen, which showed no evidence of acute process.  Associated urine culture has grown greater than 100,000 colonies of E. coli,  with sensitivities pending at this time.  In spite of good compliance with interval Keflex, the patient conveys further progression of the above symptoms, including worsening of right flank pain as well as more frequent nausea as well as perpetuation of subjective fever, prompting her to present to was not long emergency department this evening for further evaluation and management thereof.   Surgical history notable for status post appendectomy and s/p  cholecystectomy.        [facility] ED Course:  Vital signs in the ED were notable for the following: Temperature max 99.0, heart rate 101-107;   Blood pressures in the 120s to 140s; respiratory rate 70-24, oxygen saturation 95 to 99% on room air.  Labs were notable for the following: CMP notable for sodium 132, which corrects to approximately 133.5 when taking into account mild hyperglycemia, relative to most recent prior corrected value of 133 on 01/02/2022, bicarbonate 22, anion gap 12, creatinine 1.38 compared to 1.46 on 01/02/2022, glucose 207, calcium, just for mild hyperlipidemia noted to be 9.4, albumin 2.9, total bilirubin 2.0, otherwise liver enzymes within normal limits.  High-sensitivity troponin I initially 11, repeat value trending up slightly to 12.  Serum magnesium level 1.6.  CBC notable for white cell count 17,900 with 90% neutrophils, compared to 15,200 on 01/02/2022 initial lactate 2.3, with repeat value trending down to 1.5.  Urinalysis associated with AC.  Specimen associated with greater than 50 white blood cells and large leukocyte esterase in the absence of any discussed with the will cells.  Cultures x 2 collected today, COVID, influenza, RSV PCR negative.  Per my interpretation, EKG in ED demonstrated the following: Sinus rhythm with heart rate 89, normal intervals and no evidence of T wave or ST changes, including no evidence of ST elevation.  Imaging and additional notable ED work-up: Per formal radiology read, CT  abdomen/pelvis with contrast shows moderate right-sided perinephric stranding concerning for infection, along with severe right hydronephrosis without hydroureter; no evidence of right-sided kidney or ureteral stones; small nonobstructing left-sided kidney stone noted.   EDP discussed with on-call urology, Dr. Barbee Cough (fellow), who reviewed imaging and agreed that there is no evidence of obstructing stone.  Rather, he suspects that the hydronephrosis component may be chronic, with differential including stricture, and that there is no indication for acute surgical intervention.  Rather he recommends medical management for acute pyelonephritis, and conveyed that urology will formally consult and see the patient in the morning (12/18).   While in the ED, the following were administered: Zofran for milligrams IV x 1, Rocephin, normal saline x 1 L bolus.  Subsequently, the patient was admitted for further evaluation management as per sepsis due to right-sided pyelonephritis.      Review of Systems: As per HPI otherwise 10 point review of systems negative.   Past Medical History:  Diagnosis Date   Diabetes mellitus ([place])    Hypertension     Past Surgical History:  Procedure Laterality Date   APPENDECTOMY     APPENDECTOMY     CHOLECYSTECTOMY      Social History:  reports that she has never smoked. She has never used smokeless tobacco. She reports current alcohol use. She reports that she does not use drugs.   Allergies  Allergen Reactions   Choline Fenofibrate Other (See Comments)    ELEVATED LFT'S    Penicillins Hives    Family History  Problem Relation Age of Onset   Breast cancer Neg Hx     Family history reviewed and not pertinent    Prior to Admission medications   Medication Sig Start Date End Date Taking? Authorizing Provider  amLODipine (NORVASC) 5 MG tablet Take 5 mg by mouth daily. 03/27/21   [provider]  aspirin 81 MG chewable tablet Chew 81 mg by  mouth daily.    [provider]  atorvastatin (LIPITOR) 10 MG tablet Take 10 mg by mouth at bedtime. 04/29/21   [provider]  cephALEXin (KEFLEX) 500 MG capsule Take 1 capsule (500 mg total) by mouth 3 (three) times daily. 01/03/22   Horton, Barbette Hair, MD  co-enzyme Q-10 30 MG capsule Take 1 capsule by mouth daily.    [provider]  glimepiride (AMARYL) 4 MG tablet Take 2 tablets by mouth daily. 05/12/21   [provider]  hydrochlorothiazide (HYDRODIURIL) 25 MG tablet Take 25 mg by mouth every morning. 04/21/21   [provider]  losartan (COZAAR) 100 MG tablet Take 100 mg by mouth daily. 04/21/21   [provider]  metFORMIN (GLUCOPHAGE-XR) 500 MG 24 hr tablet Take 4 tablets by mouth daily as needed. 03/27/21   [provider]  ondansetron (ZOFRAN-ODT) 4 MG disintegrating tablet Take 1 tablet (4 mg total) by mouth every 8 (eight) hours as needed for nausea or vomiting. 01/03/22   Horton, Barbette Hair, MD  OZEMPIC, 0.25 OR 0.5 MG/DOSE, 2 MG/3ML SOPN Inject 0.5 mg into the skin once a week. 04/30/21   [provider]  potassium chloride SA (KLOR-CON M) 20 MEQ tablet Take 2 tablets (40 mEq total) by mouth daily. 01/03/22  Merryl Hacker, MD     Objective    Physical Exam: Vitals:   01/04/22 1727 01/04/22 1730 01/04/22 1810 01/04/22 1813  BP: 128/71 133/64 (!) 150/61   Pulse: (!) 103 (!) 103 (!) 103   Resp: (!) 24 (!) 24 (!) 22   Temp:    99 F (37.2 C)  TempSrc:      SpO2: 96% 97% 96%   Weight:      Height:        General: appears to be stated age; alert, oriented Skin: warm, dry, no rash Head:  AT/[place] Mouth:  Oral mucosa membranes appear dry, normal dentition Neck: supple; trachea midline Heart:  RRR; did not appreciate any M/R/G Lungs: CTAB, did not appreciate any wheezes, rales, or rhonchi Abdomen: + BS; soft, ND; right-sided flank tenderness noted, no evidence of guarding, rigidity, or rebound  tenderness Vascular: 2+ pedal pulses b/l; 2+ radial pulses b/l Extremities: no peripheral edema, no muscle wasting Neuro: strength and sensation intact in upper and lower extremities b/l     Labs on Admission: I have personally reviewed following labs and imaging studies  CBC: Recent Labs  Lab 01/02/22 1859 01/04/22 1257  WBC 15.2* 17.1*  NEUTROABS 13.0* 15.4*  HGB 12.5 11.8*  HCT 36.0 34.4*  MCV 89.8 91.5  PLT 292 211   Basic Metabolic Panel: Recent Labs  Lab 01/02/22 1859 01/04/22 1257  NA 132* 132*  K 3.0* 3.7  CL 92* 98  CO2 25 22  GLUCOSE 164* 207*  BUN 26* 26*  CREATININE 1.46* 1.38*  CALCIUM 8.8* 8.6*   GFR: Estimated Creatinine Clearance: 38.7 mL/min (A) (by C-G formula based on SCr of 1.38 mg/dL (H)). Liver Function Tests: Recent Labs  Lab 01/04/22 1257  AST 23  ALT 22  ALKPHOS 83  BILITOT 2.0*  PROT 6.7  ALBUMIN 2.9*   No results for input(s): "LIPASE", "AMYLASE" in the last 168 hours. No results for input(s): "AMMONIA" in the last 168 hours. Coagulation Profile: No results for input(s): "INR", "PROTIME" in the last 168 hours. Cardiac Enzymes: No results for input(s): "CKTOTAL", "CKMB", "CKMBINDEX", "TROPONINI" in the last 168 hours. BNP (last 3 results) No results for input(s): "PROBNP" in the last 8760 hours. HbA1C: No results for input(s): "HGBA1C" in the last 72 hours. CBG: Recent Labs  Lab 01/04/22 1300  GLUCAP 210*   Lipid Profile: No results for input(s): "CHOL", "HDL", "LDLCALC", "TRIG", "CHOLHDL", "LDLDIRECT" in the last 72 hours. Thyroid Function Tests: No results for input(s): "TSH", "T4TOTAL", "FREET4", "T3FREE", "THYROIDAB" in the last 72 hours. Anemia Panel: No results for input(s): "VITAMINB12", "FOLATE", "FERRITIN", "TIBC", "IRON", "RETICCTPCT" in the last 72 hours. Urine analysis:    Component Value Date/Time   COLORURINE YELLOW 01/04/2022 1637   APPEARANCEUR HAZY (A) 01/04/2022 1637   LABSPEC 1.017 01/04/2022 1637    PHURINE 5.0 01/04/2022 1637   GLUCOSEU NEGATIVE 01/04/2022 1637   HGBUR NEGATIVE 01/04/2022 1637   BILIRUBINUR NEGATIVE 01/04/2022 1637   KETONESUR 5 (A) 01/04/2022 1637   PROTEINUR 100 (A) 01/04/2022 1637   NITRITE NEGATIVE 01/04/2022 1637   LEUKOCYTESUR LARGE (A) 01/04/2022 1637    Radiological Exams on Admission: CT ABDOMEN PELVIS W CONTRAST  Result Date: 01/04/2022 CLINICAL DATA:  Sepsis weakness EXAM: CT ABDOMEN AND PELVIS WITH CONTRAST TECHNIQUE: Multidetector CT imaging of the abdomen and pelvis was performed using the standard protocol following bolus administration of intravenous contrast. RADIATION DOSE REDUCTION: This exam was performed according to the departmental dose-optimization program which  includes automated exposure control, adjustment of the mA and/or kV according to patient size and/or use of iterative reconstruction technique. CONTRAST:  129m OMNIPAQUE IOHEXOL 300 MG/ML  SOLN COMPARISON:  Radiograph 01/04/2019 FINDINGS: Lower chest: Lung bases demonstrate no acute airspace disease. Hepatobiliary: Nonvisualized gallbladder likely due to prior cholecystectomy. Prominent extrahepatic common bile duct measures up to 16 mm. No intra hepatic biliary dilatation. Pancreas: Unremarkable. No pancreatic ductal dilatation or surrounding inflammatory changes. Spleen: Normal in size without focal abnormality. Adrenals/Urinary Tract: Adrenal glands are normal. Small left kidney stone versus mild intrarenal vascular calcification. Severe hydronephrosis of the right kidney without hydroureter. Mildly dense debris within the right renal collecting system posteriorly, series 2, image 50. negative for ureteral stone. Bladder is normal. Stranding at the right renal pelvis. Mild bilateral perinephric nonspecific stranding. 10 mm possible indeterminate lesion mid pole left kidney, series 2, image 37. No significant excretion of contrast from right kidney on delayed views. Stomach/Bowel: The stomach  is nonenlarged. No dilated small bowel. No acute bowel wall thickening. Vascular/Lymphatic: Moderate severe aortic atherosclerosis. No aneurysm. No suspicious lymph nodes. Mass effect on IVC by large right renal pelvis but with flow enhancement present. Reproductive: Uterus and bilateral adnexa are unremarkable. Other: Negative for pelvic effusion or free Musculoskeletal: Degenerative changes of the lumbar spine. No acute osseous abnormality IMPRESSION: 1. 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 Nonspecific perinephric fat stranding though moderate stranding adjacent to the right renal pelvis which could be due to infection or inflammation. No apparent contrast excretion from right kidney on delayed views consistent with decreased renal function. 2. Small nonobstructing left kidney stone. 3. 10 mm indeterminate lesion mid pole left kidney. When the patient is clinically stable and able to follow directions and hold their breath (preferably as an outpatient) further evaluation with dedicated abdominal MRI should be considered. 4. Prominent extrahepatic common bile duct likely due to prior cholecystectomy. Correlate with LFTs. 5. Aortic atherosclerosis. Aortic Atherosclerosis (ICD10-I70.0). Electronically Signed   By: KDonavan FoilM.D.   On: 01/04/2022 18:15   DG Abdomen 1 View  Result Date: 01/03/2022 CLINICAL DATA:  Nausea EXAM: ABDOMEN - 1 VIEW COMPARISON:  None Available. FINDINGS: The bowel gas pattern is normal. No radio-opaque calculi or other significant radiographic abnormality are seen. IMPRESSION: Negative. Electronically Signed   By: KUlyses JarredM.D.   On: 01/03/2022 02:00      Assessment/Plan    Principal Problem:   Pyelonephritis Active Problems:   Severe sepsis (HCC)   Nausea   Acute right flank pain   Hypomagnesemia   Acute hyponatremia   DM2 (diabetes mellitus, type 2) ([place]   Essential  hypertension     #) Severe sepsis due to acute right-sided pyelonephritis: Diagnoses on the basis of recent progressive Right-sided flank tenderness associated with subjective fevers, nausea, recent diagnosis of urinary tract infection with significant pyuria noted on urinalysis from 01/02/2022, with urine culture at that time growing E. coli with sensitivities currently pending, with CT abdomen/pelvis today showing evidence of moderate right-sided perinephric stranding concerning for infection.  This CAT scan also demonstrated severe right hydronephrosis in the absence of hydroureter, without any evidence of right-sided kidney or ureter right-sided ureteral stone.   EDP discussed with on-call urology, Dr. EBarbee Cough Also felt the presentation/imaging was less suggestive of obstructing stone, and felt that the patient's hydronephrosis was more likely influenced by a chronic process, including the possibility of stricture.  Overall, urology did not feel there  to be any indication for acute surgical intervention at this time, but conveyed that they will formally consult and see the patient in the morning (12/18) recommending interval medical management for acute right pyelonephritis.   SIRS criteria met via leukocytosis, tachycardia, tachypnea. Lactic acid level: Initially elevated at 2.3, with repeat value trending down to 1.5 following interval IV fluids. Of note, given the associated presence of suspected end organ damage in the form of concominant presenting elevated lactate, criteria are met for pt's sepsis to be considered severe in nature. However, in the absence of lactic acid level that is greater than or equal to 4.0, and in the absence of any associated hypotension refractory to IVF's, there are no indications for administration of a 30 mL/kg IVF bolus at this time.   The setting of progression of the above symptoms in spite of interval Keflex, patient to be admitted for IV antibiotics.  Additional ED work-up/management notable for: Collection of blood cultures x 2 followed by initiation of Rocephin.   No e/o additional infectious process at this time, including negative COVID, influenza, RSV.   Plan: CBC w/ diff and CMP in AM.  Follow for result of sensitivities on urine culture collected on 01/02/2022.  Follow-up results of of blood cx's x 2 collected in the ED today. Abx: Continue Rocephin.  Continuous lactated Ringer's.  Urology consulted, as above.  Prn IV fentanyl, as needed IV Zofran.  In setting of the patient's recent abdominal/flank discomfort and recent initiation of Ozempic, will check a lipase level.Marland Kitchen         #) Hypomagnesemia, presenting serum magnesium 1.6, with likely influences from intermittent loose stool as well as recent decline in oral intake.   Plan: Magnesium sulfate 2 g IV every 2 hours x 1 dose now.  Repeat serum magnesium level in the morning.            #) Acute hypo-osmolar hypovolemic hyponatremia: Presenting correct serum sodium level 133.5, compared to 133 on 12/15.  Chronicity of this hyponatremic finding is not entirely clear at this time, given no prior serum sodium levels per initial chart review.  The degree of chronicity is possible given outpatient use of HCTZ.  However, suspect some more acute contribution given recent increase in GI losses, as above.  Will provide gentle IV fluids, and expand laboratory evaluation as indicated below, while holding HCTZ for now and further trending serum sodium level.   Plan: monitor strict I's and O's and daily weights.  check  andom urine sodium, urine osmolality.  Check serum osmolality to confirm suspected hypoosmolar etiology.  Repeat CMP in the morning. Check TSH. Gentle IVF's in the form of lactated Ringer's at 100 cc/h.  Hold home HCTZ for now.             #) Essential Hypertension: documented h/o such, with outpatient antihypertensive regimen including amlodipine, HCTZ, and  losartan..  SBP's in the ED today: 120s to 140s mmHg. however, in the setting of her presenting severe sepsis with increased risk for instituting hemodynamic compromise in the context of her underlying diabetes, will hold home antihypertensive medications for now.  Plan: Close monitoring of subsequent BP via routine VS. hold home blood pressure medications for now, as above.  Further evaluation management of presenting hyponatremia, as above.          #) Type 2 Diabetes Mellitus: documented history of such. Home insulin regimen: None. Home oral hypoglycemic agents: Metformin and glimepiride.  Additionally, she was recently started  on Ozempic . presenting blood sugar: 207, with suspect contribution from dehydration.  Per initial chart review, I have not encountered a recent hemoglobin A1c level.  As noted above, in the setting of recent initiation of Ozempic followed by development of right-sided abdominal/flank discomfort with intermittent nausea, will check lipase level.   Plan: accuchecks QAC and HS with low dose SSI.  Check hemoglobin A1c, lipase. hold home oral hypoglycemic agents during this hospitalization.        DVT prophylaxis: SCD's   Code Status: Full code Family Communication: none Disposition Plan: Per Rounding Team Consults called: EDP discussed patient's case with on-call urology, Dr. Barbee Cough, Will formally consult and see the patient in the morning (12/18), as further detailed above;  Admission status: Inpatient    I SPENT GREATER THAN 75  MINUTES IN CLINICAL CARE TIME/MEDICAL DECISION-MAKING IN COMPLETING THIS ADMISSION.     Vesta DO [facility] [place]   01/04/2022, 7:30 PM

## 2022-01-05 ENCOUNTER — Inpatient Hospital Stay (HOSPITAL_COMMUNITY): Payer: Medicare HMO

## 2022-01-05 DIAGNOSIS — N12 Tubulo-interstitial nephritis, not specified as acute or chronic: Secondary | ICD-10-CM | POA: Diagnosis not present

## 2022-01-05 HISTORY — PX: IR NEPHROSTOMY PLACEMENT RIGHT: IMG6064

## 2022-01-05 LAB — COMPREHENSIVE METABOLIC PANEL
ALT: 29 U/L (ref 0–44)
AST: 43 U/L — ABNORMAL HIGH (ref 15–41)
Albumin: 2.9 g/dL — ABNORMAL LOW (ref 3.5–5.0)
Alkaline Phosphatase: 101 U/L (ref 38–126)
Anion gap: 11 (ref 5–15)
BUN: 28 mg/dL — ABNORMAL HIGH (ref 8–23)
CO2: 23 mmol/L (ref 22–32)
Calcium: 8.8 mg/dL — ABNORMAL LOW (ref 8.9–10.3)
Chloride: 98 mmol/L (ref 98–111)
Creatinine, Ser: 1.73 mg/dL — ABNORMAL HIGH (ref 0.44–1.00)
GFR, Estimated: 30 mL/min — ABNORMAL LOW (ref >60)
Glucose, Bld: 146 mg/dL — ABNORMAL HIGH (ref 70–99)
Potassium: 3.4 mmol/L — ABNORMAL LOW (ref 3.5–5.1)
Sodium: 132 mmol/L — ABNORMAL LOW (ref 135–145)
Total Bilirubin: 1.7 mg/dL — ABNORMAL HIGH (ref 0.3–1.2)
Total Protein: 7.3 g/dL (ref 6.5–8.1)

## 2022-01-05 LAB — CBC WITH DIFFERENTIAL/PLATELET
Abs Immature Granulocytes: 0.38 10*3/uL — ABNORMAL HIGH (ref 0.00–0.07)
Basophils Absolute: 0.1 10*3/uL (ref 0.0–0.1)
Basophils Relative: 0 %
Eosinophils Absolute: 0 10*3/uL (ref 0.0–0.5)
Eosinophils Relative: 0 %
HCT: 35.2 % — ABNORMAL LOW (ref 36.0–46.0)
Hemoglobin: 12.1 g/dL (ref 12.0–15.0)
Immature Granulocytes: 2 %
Lymphocytes Relative: 5 %
Lymphs Abs: 1 10*3/uL (ref 0.7–4.0)
MCH: 31.3 pg (ref 26.0–34.0)
MCHC: 34.4 g/dL (ref 30.0–36.0)
MCV: 91 fL (ref 80.0–100.0)
Monocytes Absolute: 1.3 10*3/uL — ABNORMAL HIGH (ref 0.1–1.0)
Monocytes Relative: 6 %
Neutro Abs: 20.2 10*3/uL — ABNORMAL HIGH (ref 1.7–7.7)
Neutrophils Relative %: 87 %
Platelets: 426 10*3/uL — ABNORMAL HIGH (ref 150–400)
RBC: 3.87 MIL/uL (ref 3.87–5.11)
RDW: 12.6 % (ref 11.5–15.5)
WBC: 22.9 10*3/uL — ABNORMAL HIGH (ref 4.0–10.5)
nRBC: 0 % (ref 0.0–0.2)

## 2022-01-05 LAB — URINE CULTURE
Culture: >=100000 — AB
Culture: <10000 — AB

## 2022-01-05 LAB — CBG MONITORING, ED
Glucose-Capillary: 184 mg/dL — ABNORMAL HIGH (ref 70–99)
Glucose-Capillary: 133 mg/dL — ABNORMAL HIGH (ref 70–99)
Glucose-Capillary: 154 mg/dL — ABNORMAL HIGH (ref 70–99)

## 2022-01-05 LAB — PROTIME-INR
INR: 1.2 (ref 0.8–1.2)
Prothrombin Time: 15.3 seconds — ABNORMAL HIGH (ref 11.4–15.2)

## 2022-01-05 LAB — GLUCOSE, CAPILLARY: Glucose-Capillary: 175 mg/dL — ABNORMAL HIGH (ref 70–99)

## 2022-01-05 LAB — LIPASE, BLOOD: Lipase: 44 U/L (ref 11–51)

## 2022-01-05 LAB — TSH: TSH: 1.717 u[IU]/mL (ref 0.350–4.500)

## 2022-01-05 LAB — SODIUM, URINE, RANDOM: Sodium, Ur: <10 mmol/L

## 2022-01-05 LAB — MAGNESIUM: Magnesium: 2.2 mg/dL (ref 1.7–2.4)

## 2022-01-05 LAB — OSMOLALITY, URINE: Osmolality, Ur: 471 mOsm/kg (ref 300–900)

## 2022-01-05 LAB — CREATININE, URINE, RANDOM: Creatinine, Urine: 157 mg/dL

## 2022-01-05 MED ORDER — FENTANYL CITRATE (PF) 100 MCG/2ML IJ SOLN
INTRAMUSCULAR | Status: AC
  Filled 2022-01-05: qty 2

## 2022-01-05 MED ORDER — FENTANYL CITRATE (PF) 100 MCG/2ML IJ SOLN
INTRAMUSCULAR | Status: AC | PRN
  Administered 2022-01-05 (×2): 50 ug via INTRAVENOUS

## 2022-01-05 MED ORDER — SODIUM CHLORIDE 0.9 % IV SOLN
2.0000 g | INTRAVENOUS | Status: DC
  Administered 2022-01-05 – 2022-01-06 (×2): 2 g via INTRAVENOUS
  Filled 2022-01-05 (×2): qty 20

## 2022-01-05 MED ORDER — SODIUM CHLORIDE 0.9 % IV SOLN: 2.0000 g | INTRAVENOUS | Status: DC

## 2022-01-05 MED ORDER — LEVOFLOXACIN IN D5W 500 MG/100ML IV SOLN
500.0000 mg | Freq: Once | INTRAVENOUS | Status: AC
  Administered 2022-01-05: 500 mg via INTRAVENOUS
  Filled 2022-01-05: qty 100

## 2022-01-05 MED ORDER — LIDOCAINE HCL 1 % IJ SOLN
INTRAMUSCULAR | Status: AC
  Administered 2022-01-05: 10 mL
  Filled 2022-01-05: qty 20

## 2022-01-05 MED ORDER — MIDAZOLAM HCL 2 MG/2ML IJ SOLN
INTRAMUSCULAR | Status: AC
  Filled 2022-01-05: qty 2

## 2022-01-05 MED ORDER — POTASSIUM CHLORIDE 10 MEQ/100ML IV SOLN
10.0000 meq | Freq: Once | INTRAVENOUS | Status: AC
  Administered 2022-01-05: 10 meq via INTRAVENOUS
  Filled 2022-01-05: qty 100

## 2022-01-05 MED ORDER — MIDAZOLAM HCL 2 MG/2ML IJ SOLN
INTRAMUSCULAR | Status: AC | PRN
  Administered 2022-01-05 (×2): 1 mg via INTRAVENOUS

## 2022-01-05 MED ORDER — IOHEXOL 300 MG/ML  SOLN
50.0000 mL | Freq: Once | INTRAMUSCULAR | Status: AC | PRN
  Administered 2022-01-05: 15 mL

## 2022-01-05 MED ORDER — HYDROCODONE-ACETAMINOPHEN 5-325 MG PO TABS
1.0000 | ORAL_TABLET | Freq: Four times a day (QID) | ORAL | Status: DC | PRN
  Administered 2022-01-05 – 2022-01-06 (×2): 1 via ORAL
  Filled 2022-01-05 (×3): qty 1

## 2022-01-05 MED ORDER — SODIUM CHLORIDE 0.9 % IV SOLN: INTRAVENOUS | Status: DC

## 2022-01-05 NOTE — Procedures (Addendum)
Interventional Radiology Procedure Note  Procedure: Image guided drain placement, right PCN.  48F pigtail drain.  Complications: None  EBL: None Sample: Culture sent  Findings: The cystic structure connects to the collecting system on injection.  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   pyonephrosis  Recommendations: - Routine drain care, record output - follow up Cx - routine wound care - observe for rigors  Signed,  Dulcy Fanny. Earleen Newport, DO

## 2022-01-05 NOTE — Progress Notes (Signed)
PROGRESS NOTE Melissa Leon  IEP:[id number] DOB: 1945/04/11 DOA: 01/04/2022 PCP: Katherina Mires, MD   Brief Narrative/Hospital Course: 76 year old female with T2DM hypertension presents to the ED for progressively worsening right flank pain feeling unwell for few weeks with low energy, decreased appetite, intermittent nausea.  She has had subjective fever.  She was recently started on Ozempic and having some intolerance.  In the ED She was seen in the [facility] ED on 12/15 found to have UTI discharged on Keflex despite which she continued to have worsening symptoms.  Urine culture showed E. coli from that visit. Seen in the ED again with low-grade fever, vitals fairly stable Labs showed worsening WBC count 721 from 15.2 on 12/15, creatinine about the same 1.3 , Lactic acidosis 2.3 UA with WBC more than 50 positive LE negative any urine blood culture sent, imaging obtained with x-ray abdomen CT abdomen with contrast> severe hydronephrosis of right kidney possible UPJ obstruction, perinephric fat stranding small left kidney stone lesion on the left kidney midpole MRI advised. Urology consulted,placed on antibiotics and admitted  Subjective: Seen and examined this am in ED. C/o nausea and rt flank pain. No vomiting fever chills. Has been fatigued, low appetite last few  weeks. 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  Follow-up urine and blood culture. Urine cx w/ pansensitive E. coli (01/02/22).lactic acidosis resolved, BP stable.Urology following closely > planning for IR nephrostomy tube this am> monitor closely.  Monitor closely post procedure low threshold for progressive/stepdown bed. Recent Labs  Lab 01/02/22 1859 01/04/22 1257  01/04/22 1714 01/04/22 1956 01/05/22 0453  WBC 15.2* 17.1*  --   --  22.9*  LATICACIDVEN  --   --  2.3* 1.5  --     Acute kidney injury on CKD stage IIIb baseline creatinine around 1.3 in December.  Uptrending.  Patient did receive contrast . Starting iv nss at 125 cc/hr. Monitor intake output renal function closely Recent Labs  Lab 01/02/22 1859 01/04/22 1257 01/04/22 1956 01/05/22 0453  NA 132* 132*  --  132*  K 3.0* 3.7  --  3.4*  CL 92* 98  --  98  CO2 25 22  --  23  GLUCOSE 164* 207*  --  146*  BUN 26* 26*  --  28*  CREATININE 1.46* 1.38*  --  1.73*  CALCIUM 8.8* 8.6*  --  8.8*  MG  --   --  1.6* 2.2    Nausea: Symptomatic management Hypokalemia replaced Hypomagnesemia: Improved Acute hyponatremia: Continue gentle IV fluids DM2: hold home Ozempic.Keeping on sliding scale insulin for now Recent Labs  Lab 01/04/22 1300 01/05/22 0740  GLUCAP 210* 184*    Essential hypertension: BP stable holding antihypertensives.   DVT prophylaxis: SCDs Start: 01/04/22 1928 Code Status:   Code Status: Full Code Family Communication: plan of care discussed with patient at bedside. Patient status is: inpatient  because of Sepsis  Level of care: Telemetry  Dispo: The patient is from: home alone , daughter lives close by             Anticipated disposition: >2-3 days Objective: Vitals last 24 hrs: Vitals:   01/05/22 0915 01/05/22 1000 01/05/22 1015 01/05/22 1019  BP: (!) 145/66 (!) 147/73 (!) 147/73   Pulse:  96  97   Resp: 18  18   Temp:    98.6 F (37 C)  TempSrc:    Oral  SpO2: 95%  95%   Weight:      Height:       Weight change:   Physical Examination: General exam: alert awake ox3, appears older than stated age HEENT:Oral mucosa moist, Ear/Nose WNL grossly Respiratory system: bilaterally clear BS,no use of accessory muscle Cardiovascular system: S1 & S2 +, No JVD. Gastrointestinal system: Abdomen soft, Rt abdomen/right flank tender,ND,BS+ Nervous System:Alert,  awake, moving extremities. Extremities: LE edema neg,distal peripheral pulses palpable.  Skin: No rashes,no icterus. MSK: Normal muscle bulk,tone, power  Medications reviewed:  Scheduled Meds:  insulin aspart  0-6 Units Subcutaneous TID WC   lidocaine       Continuous Infusions:  sodium chloride 125 mL/hr at 01/05/22 0803   cefTRIAXone (ROCEPHIN)  IV     levofloxacin (LEVAQUIN) IV      Diet Order             Diet NPO time specified  Diet effective now                 No intake or output data in the 24 hours ending 01/05/22 1050 Net IO Since Admission: No IO data has been entered for this period [01/05/22 1050]  Wt Readings from Last 3 Encounters:  01/04/22 84 kg  06/03/21 83.9 kg  05/30/21 83.9 kg     Unresulted Labs (From admission, onward)     Start     Ordered   01/06/22 4431  Basic metabolic panel  Daily,   R      01/05/22 0747   01/06/22 0500  CBC  Daily,   R      01/05/22 0747   01/04/22 2241  TSH  Once,   R        01/04/22 2241   01/04/22 2241  Osmolality  Once,   R        01/04/22 2241   01/04/22 2209  Osmolality, urine  Add-on,   AD        01/04/22 2208   01/04/22 2052  Hemoglobin A1c  Add-on,   AD       Comments: To assess prior glycemic control    01/04/22 2052   01/04/22 1637  Urine Culture  (Urine Culture)  Once,   URGENT       Question:  Indication  Answer:  Dysuria   01/04/22 1636          Data Reviewed: I have personally reviewed following labs and imaging studies CBC: Recent Labs  Lab 01/02/22 1859 01/04/22 1257 01/05/22 0453  WBC 15.2* 17.1* 22.9*  NEUTROABS 13.0* 15.4* 20.2*  HGB 12.5 11.8* 12.1  HCT 36.0 34.4* 35.2*  MCV 89.8 91.5 91.0  PLT 292 290 540*  Basic Metabolic Panel: Recent Labs  Lab 01/02/22 1859 01/04/22 1257 01/04/22 1956 01/05/22 0453  NA 132* 132*  --  132*  K 3.0* 3.7  --  3.4*  CL 92* 98  --  98  CO2 25 22  --  23  GLUCOSE 164* 207*  --  146*  BUN 26* 26*  --  28*  CREATININE 1.46* 1.38*  --   1.73*  CALCIUM 8.8* 8.6*  --  8.8*  MG  --   --  1.6* 2.2  GFR: Estimated Creatinine Clearance: 30.8 mL/min (A) (by C-G formula based on SCr of 1.73 mg/dL (H)).  Liver Function Tests: Recent Labs  Lab 01/04/22 1257 01/05/22 0453  AST 23 43*  ALT 22 29  ALKPHOS 83 101  BILITOT 2.0* 1.7*  PROT 6.7 7.3  ALBUMIN 2.9* 2.9*   Recent Labs  Lab 01/05/22 0453  LIPASE 44  CBG: Recent Labs  Lab 01/04/22 1300 01/05/22 0740  GLUCAP 210* 184*  Sepsis Labs: Recent Labs  Lab 01/04/22 1714 01/04/22 1956  LATICACIDVEN 2.3* 1.5   Recent Results (from the past 240 hour(s))  Resp panel by RT-PCR (RSV, Flu A&B, Covid) Anterior Nasal Swab     Status: None   Collection Time: 01/02/22  6:59 PM   Specimen: Anterior Nasal Swab  Result Value Ref Range Status   SARS Coronavirus 2 by RT PCR NEGATIVE NEGATIVE Final    Comment: (NOTE) SARS-CoV-2 target nucleic acids are NOT DETECTED.  The SARS-CoV-2 RNA is generally detectable in upper respiratory specimens during the acute phase of infection. The lowest concentration of SARS-CoV-2 viral copies this assay can detect is 138 copies/mL. A negative result does not preclude SARS-Cov-2 infection and should not be used as the sole basis for treatment or other patient management decisions. A negative result may occur with  improper specimen collection/handling, submission of specimen other than nasopharyngeal swab, presence of viral mutation(s) within the areas targeted by this assay, and inadequate number of viral copies(<138 copies/mL). A negative result must be combined with clinical observations, patient history, and epidemiological information. The expected result is Negative.  Fact Sheet for Patients:  EntrepreneurPulse.com.au  Fact Sheet for Healthcare Providers:  IncredibleEmployment.be  This test is no t yet approved or cleared by the [place] FDA and  has been authorized for detection and/or  diagnosis of SARS-CoV-2 by FDA under an Emergency Use Authorization (EUA). This EUA will remain  in effect (meaning this test can be used) for the duration of the COVID-19 declaration under Section 564(b)(1) of the Act, 21 U.S.C.section 360bbb-3(b)(1), unless the authorization is terminated  or revoked sooner.       Influenza A by PCR NEGATIVE NEGATIVE Final   Influenza B by PCR NEGATIVE NEGATIVE Final    Comment: (NOTE) The Xpert Xpress SARS-CoV-2/FLU/RSV plus assay is intended as an aid in the diagnosis of influenza from Nasopharyngeal swab specimens and should not be used as a sole basis for treatment. Nasal washings and aspirates are unacceptable for Xpert Xpress SARS-CoV-2/FLU/RSV testing.  Fact Sheet for Patients: EntrepreneurPulse.com.au  Fact Sheet for Healthcare Providers: IncredibleEmployment.be  This test is not yet approved or cleared by the [place] FDA and has been authorized for detection and/or diagnosis of SARS-CoV-2 by FDA under an Emergency Use Authorization (EUA). This EUA will remain in effect (meaning this test can be used) for the duration of the COVID-19 declaration under Section 564(b)(1) of the Act, 21 U.S.C. section 360bbb-3(b)(1), unless the authorization is terminated or revoked.     Resp Syncytial Virus by PCR NEGATIVE NEGATIVE Final    Comment: (NOTE) Fact Sheet for Patients: EntrepreneurPulse.com.au  Fact Sheet for Healthcare Providers: IncredibleEmployment.be  This test is not yet approved or cleared by the [place] FDA and has been authorized for detection and/or diagnosis of SARS-CoV-2 by FDA under an Emergency Use Authorization (EUA). This EUA will remain in effect (meaning this test can be used) for the duration of the COVID-19 declaration under Section 564(b)(1) of the Act, 21 U.S.C. section 360bbb-3(b)(1), unless the authorization is terminated  or revoked.  Performed at [facility], [address],  [place], [place] [address]   Urine Culture     Status: Abnormal   Collection Time: 01/02/22  6:59 PM   Specimen: Urine, Clean Catch  Result Value Ref Range Status   Specimen Description   Final    URINE, CLEAN CATCH Performed at [facility], [address], [place], [place] [address]    Special Requests   Final    NONE Performed at [facility], [address], [place], [place] [address]    Culture >=100,000 COLONIES/mL ESCHERICHIA COLI (A)  Final   Report Status 01/05/2022 FINAL  Final   Organism ID, Bacteria ESCHERICHIA COLI (A)  Final      Susceptibility   Escherichia coli - MIC*    AMPICILLIN <=2 SENSITIVE Sensitive     CEFAZOLIN <=4 SENSITIVE Sensitive     CEFEPIME <=0.12 SENSITIVE Sensitive     CEFTRIAXONE <=0.25 SENSITIVE Sensitive     CIPROFLOXACIN <=0.25 SENSITIVE Sensitive     GENTAMICIN <=1 SENSITIVE Sensitive     IMIPENEM <=0.25 SENSITIVE Sensitive     NITROFURANTOIN <=16 SENSITIVE Sensitive     TRIMETH/SULFA <=20 SENSITIVE Sensitive     AMPICILLIN/SULBACTAM <=2 SENSITIVE Sensitive     PIP/TAZO <=4 SENSITIVE Sensitive     * >=100,000 COLONIES/mL ESCHERICHIA COLI  Blood culture (routine x 2)     Status: None (Preliminary result)   Collection Time: 01/04/22  5:14 PM   Specimen: BLOOD  Result Value Ref Range Status   Specimen Description   Final    BLOOD LEFT ANTECUBITAL Performed at [facility] [address] [place], [place] [address]    Special Requests   Final    BOTTLES DRAWN AEROBIC AND ANAEROBIC Blood Culture adequate volume Performed at [facility] [address] [place], [place] [address]    Culture   Final    NO GROWTH < 12 HOURS Performed at [facility] [address] [place], [place] [address]    Report Status PENDING  Incomplete  Blood culture (routine x 2)     Status: None  (Preliminary result)   Collection Time: 01/04/22  5:14 PM   Specimen: BLOOD  Result Value Ref Range Status   Specimen Description   Final    BLOOD RIGHT ANTECUBITAL Performed at [facility] [address], [place], [place] [address]    Special Requests   Final    BOTTLES DRAWN AEROBIC ONLY Blood Culture results may not be optimal due to an inadequate volume of blood received in culture bottles Performed at [facility] [address] [place], [place] [address]    Culture   Final    NO GROWTH < 12 HOURS Performed at [facility] [address], [place], [place] [address]    Report Status PENDING  Incomplete  Resp panel by RT-PCR (RSV, Flu A&B, Covid) Anterior Nasal Swab     Status: None   Collection Time: 01/04/22  5:14 PM   Specimen: Anterior Nasal Swab  Result Value Ref Range Status   SARS Coronavirus 2 by RT PCR NEGATIVE NEGATIVE Final    Comment: (NOTE) SARS-CoV-2 target nucleic acids are NOT DETECTED.  The SARS-CoV-2 RNA is generally detectable in upper respiratory specimens during the acute phase of infection. The lowest concentration of SARS-CoV-2 viral copies this assay can detect is 138 copies/mL. A negative result does not preclude SARS-Cov-2 infection and should not be used as the sole basis for treatment or other  patient management decisions. A negative result may occur with  improper specimen collection/handling, submission of specimen other than nasopharyngeal swab, presence of viral mutation(s) within the areas targeted by this assay, and inadequate number of viral copies(<138 copies/mL). A negative result must be combined with clinical observations, patient history, and epidemiological information. The expected result is Negative.  Fact Sheet for Patients:  EntrepreneurPulse.com.au  Fact Sheet for Healthcare Providers:  IncredibleEmployment.be  This test is no t yet approved  or cleared by the [place] FDA and  has been authorized for detection and/or diagnosis of SARS-CoV-2 by FDA under an Emergency Use Authorization (EUA). This EUA will remain  in effect (meaning this test can be used) for the duration of the COVID-19 declaration under Section 564(b)(1) of the Act, 21 U.S.C.section 360bbb-3(b)(1), unless the authorization is terminated  or revoked sooner.       Influenza A by PCR NEGATIVE NEGATIVE Final   Influenza B by PCR NEGATIVE NEGATIVE Final    Comment: (NOTE) The Xpert Xpress SARS-CoV-2/FLU/RSV plus assay is intended as an aid in the diagnosis of influenza from Nasopharyngeal swab specimens and should not be used as a sole basis for treatment. Nasal washings and aspirates are unacceptable for Xpert Xpress SARS-CoV-2/FLU/RSV testing.  Fact Sheet for Patients: EntrepreneurPulse.com.au  Fact Sheet for Healthcare Providers: IncredibleEmployment.be  This test is not yet approved or cleared by the [place] FDA and has been authorized for detection and/or diagnosis of SARS-CoV-2 by FDA under an Emergency Use Authorization (EUA). This EUA will remain in effect (meaning this test can be used) for the duration of the COVID-19 declaration under Section 564(b)(1) of the Act, 21 U.S.C. section 360bbb-3(b)(1), unless the authorization is terminated or revoked.     Resp Syncytial Virus by PCR NEGATIVE NEGATIVE Final    Comment: (NOTE) Fact Sheet for Patients: EntrepreneurPulse.com.au  Fact Sheet for Healthcare Providers: IncredibleEmployment.be  This test is not yet approved or cleared by the [place] FDA and has been authorized for detection and/or diagnosis of SARS-CoV-2 by FDA under an Emergency Use Authorization (EUA). This EUA will remain in effect (meaning this test can be used) for the duration of the COVID-19 declaration under Section 564(b)(1) of the Act,  21 U.S.C. section 360bbb-3(b)(1), unless the authorization is terminated or revoked.  Performed at [facility], [place] [address] [place], [place] [address]     Antimicrobials: Anti-infectives (From admission, onward)    Start     Dose/Rate Route Frequency Ordered Stop   01/05/22 1700  cefTRIAXone (ROCEPHIN) 2 g in sodium chloride 0.9 % 100 mL IVPB        2 g 200 mL/hr over 30 Minutes Intravenous Every 24 hours 01/05/22 0752     01/05/22 1600  cefTRIAXone (ROCEPHIN) 1 g in sodium chloride 0.9 % 100 mL IVPB  Status:  Discontinued        1 g 200 mL/hr over 30 Minutes Intravenous Every 24 hours 01/04/22 1929 01/05/22 0747   01/05/22 1100  levofloxacin (LEVAQUIN) IVPB 500 mg        500 mg 100 mL/hr over 60 Minutes Intravenous  Once 01/05/22 1049     01/05/22 0747  cefTRIAXone (ROCEPHIN) 2 g in sodium chloride 0.9 % 100 mL IVPB  Status:  Discontinued        2 g 200 mL/hr over 30 Minutes Intravenous Every 24 hours 01/05/22 0747 01/05/22 0752   01/04/22 1645  cefTRIAXone (ROCEPHIN) 1 g in sodium chloride 0.9 % 100  mL IVPB        1 g 200 mL/hr over 30 Minutes Intravenous  Once 01/04/22 1637 01/04/22 1817      Culture/Microbiology    Component Value Date/Time   SDES  01/04/2022 1714    BLOOD LEFT ANTECUBITAL Performed at [facility], [place] [address], [place], [place] [address]    SDES  01/04/2022 1714    BLOOD RIGHT ANTECUBITAL Performed at [facility], [place] [address], [place], [place] [address]    SPECREQUEST  01/04/2022 1714    BOTTLES DRAWN AEROBIC AND ANAEROBIC Blood Culture adequate volume Performed at [facility] [address], [place], [place] [address]    SPECREQUEST  01/04/2022 1714    BOTTLES DRAWN AEROBIC ONLY Blood Culture results may not be optimal due to an inadequate volume of blood received in culture bottles Performed at [facility], [place] [address],  [place], [place] [address]    CULT  01/04/2022 1714    NO GROWTH < 12 HOURS Performed at [facility] [address], [place], [place] [address]    CULT  01/04/2022 1714    NO GROWTH < 12 HOURS Performed at [facility], [place] [address], [place], [place] [address]    REPTSTATUS PENDING 01/04/2022 1714   REPTSTATUS PENDING 01/04/2022 1714  Other culture-see note  Radiology Studies: CT ABDOMEN PELVIS W CONTRAST  Result Date: 01/04/2022 CLINICAL DATA:  Sepsis weakness EXAM: CT ABDOMEN AND PELVIS WITH CONTRAST TECHNIQUE: Multidetector CT imaging of the abdomen and pelvis was performed using the standard protocol following bolus administration of intravenous contrast. RADIATION DOSE REDUCTION: This exam was performed according to the departmental dose-optimization program which includes automated exposure control, adjustment of the mA and/or kV according to patient size and/or use of iterative reconstruction technique. CONTRAST:  18m OMNIPAQUE IOHEXOL 300 MG/ML  SOLN COMPARISON:  Radiograph 01/04/2019 FINDINGS: Lower chest: Lung bases demonstrate no acute airspace disease. Hepatobiliary: Nonvisualized gallbladder likely due to prior cholecystectomy. Prominent extrahepatic common bile duct measures up to 16 mm. No intra hepatic biliary dilatation. Pancreas: Unremarkable. No pancreatic ductal dilatation or surrounding inflammatory changes. Spleen: Normal in size without focal abnormality. Adrenals/Urinary Tract: Adrenal glands are normal. Small left kidney stone versus mild intrarenal vascular calcification. Severe hydronephrosis of the right kidney without hydroureter. Mildly dense debris within the right renal collecting system posteriorly, series 2, image 50. negative for ureteral stone. Bladder is normal. Stranding at the right renal pelvis. Mild bilateral perinephric nonspecific stranding. 10 mm possible indeterminate lesion mid pole left kidney, series 2, image 37. No significant excretion of  contrast from right kidney on delayed views. Stomach/Bowel: The stomach is nonenlarged. No dilated small bowel. No acute bowel wall thickening. Vascular/Lymphatic: Moderate severe aortic atherosclerosis. No aneurysm. No suspicious lymph nodes. Mass effect on IVC by large right renal pelvis but with flow enhancement present. Reproductive: Uterus and bilateral adnexa are unremarkable. Other: Negative for pelvic effusion or free Musculoskeletal: Degenerative changes of the lumbar spine. No acute osseous abnormality IMPRESSION: 1. 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 Nonspecific perinephric fat stranding though moderate stranding adjacent to the right renal pelvis which could be due to infection or inflammation. No apparent contrast excretion from right kidney on delayed views consistent with decreased renal function. 2. Small nonobstructing left kidney stone. 3. 10 mm indeterminate lesion mid pole left kidney. When the patient is clinically stable and able to follow  directions and hold their breath (preferably as an outpatient) further evaluation with dedicated abdominal MRI should be considered. 4. Prominent extrahepatic common bile duct likely due to prior cholecystectomy. Correlate with LFTs. 5. Aortic atherosclerosis. Aortic Atherosclerosis (ICD10-I70.0). Electronically Signed   By: Donavan Foil M.D.   On: 01/04/2022 18:15     LOS: 1 day   Antonieta Pert, MD [facility]  01/05/2022, 10:50 AM

## 2022-01-05 NOTE — Progress Notes (Addendum)
Subjective: Continued flank pain, "feels like I was hit by a truck." No fevers. Persistent nausea. WBC up to 22.9, Cr 1.73. Bcx Ngx12h.  Objective: Vital signs in last 24 hours: Temp:  [97.5 F (36.4 C)-99 F (37.2 C)] 98.7 F (37.1 C) (12/18 0559) Pulse Rate:  [91-110] 100 (12/18 0700) Resp:  [16-24] 22 (12/18 0700) BP: (117-151)/(60-78) 151/78 (12/18 0700) SpO2:  [91 %-98 %] 95 % (12/18 0700) Weight:  [84 kg] 84 kg (12/17 1144)  Intake/Output from previous day: No intake/output data recorded. Intake/Output this shift: No intake/output data recorded.  Physical Exam:  General: Alert and oriented CV: RRR Lungs: Clear Abdomen: Soft, ND GU: voiding spontaneously Ext: NT, No erythema  Lab Results: Recent Labs    01/02/22 1859 01/04/22 1257 01/05/22 0453  HGB 12.5 11.8* 12.1  HCT 36.0 34.4* 35.2*   BMET Recent Labs    01/04/22 1257 01/05/22 0453  NA 132* 132*  K 3.7 3.4*  CL 98 98  CO2 22 23  GLUCOSE 207* 146*  BUN 26* 28*  CREATININE 1.38* 1.73*  CALCIUM 8.6* 8.8*     Studies/Results: CT ABDOMEN PELVIS W CONTRAST  Result Date: 01/04/2022 CLINICAL DATA:  Sepsis weakness EXAM: CT ABDOMEN AND PELVIS WITH CONTRAST TECHNIQUE: Multidetector CT imaging of the abdomen and pelvis was performed using the standard protocol following bolus administration of intravenous contrast. RADIATION DOSE REDUCTION: This exam was performed according to the departmental dose-optimization program which includes automated exposure control, adjustment of the mA and/or kV according to patient size and/or use of iterative reconstruction technique. CONTRAST:  163m OMNIPAQUE IOHEXOL 300 MG/ML  SOLN COMPARISON:  Radiograph 01/04/2019 FINDINGS: Lower chest: Lung bases demonstrate no acute airspace disease. Hepatobiliary: Nonvisualized gallbladder likely due to prior cholecystectomy. Prominent extrahepatic common bile duct measures up to 16 mm. No intra hepatic biliary dilatation. Pancreas:  Unremarkable. No pancreatic ductal dilatation or surrounding inflammatory changes. Spleen: Normal in size without focal abnormality. Adrenals/Urinary Tract: Adrenal glands are normal. Small left kidney stone versus mild intrarenal vascular calcification. Severe hydronephrosis of the right kidney without hydroureter. Mildly dense debris within the right renal collecting system posteriorly, series 2, image 50. negative for ureteral stone. Bladder is normal. Stranding at the right renal pelvis. Mild bilateral perinephric nonspecific stranding. 10 mm possible indeterminate lesion mid pole left kidney, series 2, image 37. No significant excretion of contrast from right kidney on delayed views. Stomach/Bowel: The stomach is nonenlarged. No dilated small bowel. No acute bowel wall thickening. Vascular/Lymphatic: Moderate severe aortic atherosclerosis. No aneurysm. No suspicious lymph nodes. Mass effect on IVC by large right renal pelvis but with flow enhancement present. Reproductive: Uterus and bilateral adnexa are unremarkable. Other: Negative for pelvic effusion or free Musculoskeletal: Degenerative changes of the lumbar spine. No acute osseous abnormality IMPRESSION: 1. 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 Nonspecific perinephric fat stranding though moderate stranding adjacent to the right renal pelvis which could be due to infection or inflammation. No apparent contrast excretion from right kidney on delayed views consistent with decreased renal function. 2. Small nonobstructing left kidney stone. 3. 10 mm indeterminate lesion mid pole left kidney. When the patient is clinically stable and able to follow directions and hold their breath (preferably as an outpatient) further evaluation with dedicated abdominal MRI should be considered. 4. Prominent extrahepatic common bile duct likely due to prior cholecystectomy. Correlate with LFTs.  5. Aortic atherosclerosis. Aortic Atherosclerosis (ICD10-I70.0). Electronically Signed   By: KDonavan Foil  M.D.   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 Ideally CTU and lasix renogram to better characterize anatomy and to delineate right kidney split function UTI Currently on CTX Follow-up cultures, cater accordingly Recommend 10-14 day course for complicated UTI AKI Likely multifactorial in the setting of obstruction and dehydration from poor PO intake over last week Encourage PO intake when possible and supplement with mIVF Trend Cr, monitor I/Os 4. Indeterminant renal lesion: Seen on CT A/P 12/17. Will plan for non-urgent follow up imaging, likely MRI outpatient.   LOS: 1 day   Lamar Laundry 01/05/2022, 7:20 AM  I have seen and examined the patient and agree with the above assessment and plan.  Agree with above. Plan for R PCN today. Discussed that after improving with antibiotics and kidney recovery, will need CTU and lasix renal scan. Discussed options of pyeloplasty vs stent exchange if found to have UPJ obstruction. Following.  Matt R. [facility]  Pager: [phone]

## 2022-01-05 NOTE — Consult Note (Signed)
Chief Complaint: Patient was seen in consultation today for flank pain at the request of Dr. Rexene Alberts  Referring Physician(s): Rexene Alberts, MD  Supervising Physician: Corrie Mckusick  Patient Status: [facility] - ED  History of Present Illness: Melissa Leon is a 76 y.o. female with PMH DMII and HTN who is admitted to [facility] with sepsis dut to right-sided pyelonephritis.  She has been having right flank pain for past 7-10 days, reports fatigue, malaise, decreased appetite, and nausea.  Workup reveals significant hydronephrosis on CT, possible UPJ obstruction.  IR consulted for R PCN.    Past Medical History:  Diagnosis Date   Diabetes mellitus ([place])    Hypertension     Past Surgical History:  Procedure Laterality Date   APPENDECTOMY     APPENDECTOMY     CHOLECYSTECTOMY      Allergies: Choline fenofibrate, Penicillins, and Ozempic (0.25 or 0.5 mg-dose) [semaglutide(0.25 or 0.'5mg'$ -dos)]  Medications: Prior to Admission medications   Medication Sig Start Date End Date Taking? Authorizing Provider  amLODipine (NORVASC) 5 MG tablet Take 5 mg by mouth at bedtime. 03/27/21  Yes [provider]  aspirin 81 MG chewable tablet Chew 81 mg by mouth at bedtime.   Yes [provider]  atorvastatin (LIPITOR) 10 MG tablet Take 10 mg by mouth at bedtime. 04/29/21  Yes [provider]  cephALEXin (KEFLEX) 500 MG capsule Take 1 capsule (500 mg total) by mouth 3 (three) times daily. 01/03/22  Yes Horton, Barbette Hair, MD  glimepiride (AMARYL) 4 MG tablet Take 4 mg by mouth 2 (two) times daily with a meal. 05/12/21  Yes [provider]  hydrochlorothiazide (HYDRODIURIL) 25 MG tablet Take 25 mg by mouth every morning. 04/21/21  Yes [provider]  losartan (COZAAR) 100 MG tablet Take 100 mg by mouth in the morning. 04/21/21  Yes [provider]  LUMIFY 0.025 % SOLN Place 1 drop into both eyes 3 (three) times daily as needed (for redness).   Yes [provider]  metFORMIN (GLUCOPHAGE-XR) 500 MG 24 hr tablet Take 1,000 mg by mouth 2 (two) times daily with a meal. 03/27/21  Yes [provider]  ondansetron (ZOFRAN-ODT) 4 MG disintegrating tablet Take 1 tablet (4 mg total) by mouth every 8 (eight) hours as needed for nausea or vomiting. 01/03/22  Yes Horton, Barbette Hair, MD  potassium chloride SA (KLOR-CON M) 20 MEQ tablet Take 2 tablets (40 mEq total) by mouth daily. 01/03/22  Yes Horton, Barbette Hair, MD     Family History  Problem Relation Age of Onset   Breast cancer Neg Hx     Social History   Socioeconomic History   Marital status: Widowed    Spouse name: Not on file   Number of children: Not on file   Years of education: Not on file   Highest education level: Not on file  Occupational History   Not on file  Tobacco Use   Smoking status: Never   Smokeless tobacco: Never  Vaping Use   Vaping Use: Never used  Substance and Sexual Activity   Alcohol use: Yes   Drug use: Never   Sexual activity: Yes  Other Topics Concern   Not on file  Social History Narrative   Not on file   Social Determinants of Health   Financial Resource Strain: Not on file  Food Insecurity: Not on file  Transportation Needs: Not on file  Physical Activity: Not on file  Stress: Not on  file  Social Connections: Not on file   Review of Systems  Constitutional:  Positive for activity change, appetite change, fatigue and fever. Negative for chills.  HENT: Negative.    Eyes: Negative.   Respiratory: Negative.    Cardiovascular: Negative.   Gastrointestinal:  Positive for diarrhea and nausea. Negative for vomiting.  Genitourinary:  Positive for flank pain.  Musculoskeletal:  Positive for myalgias.  Allergic/Immunologic: Negative.   Neurological: Negative.   Hematological: Negative.   Psychiatric/Behavioral: Negative.     Vital Signs: BP 136/79   Pulse 93   Temp 98.7 F (37.1 C)   Resp 19   Ht '5\' 7"'$  (1.702 m)   Wt 185 lb 3 oz  (84 kg)   SpO2 93%   BMI 29.00 kg/m   Physical Exam  Imaging: CT ABDOMEN PELVIS W CONTRAST  Result Date: 01/04/2022 CLINICAL DATA:  Sepsis weakness EXAM: CT ABDOMEN AND PELVIS WITH CONTRAST TECHNIQUE: Multidetector CT imaging of the abdomen and pelvis was performed using the standard protocol following bolus administration of intravenous contrast. RADIATION DOSE REDUCTION: This exam was performed according to the departmental dose-optimization program which includes automated exposure control, adjustment of the mA and/or kV according to patient size and/or use of iterative reconstruction technique. CONTRAST:  127m OMNIPAQUE IOHEXOL 300 MG/ML  SOLN COMPARISON:  Radiograph 01/04/2019 FINDINGS: Lower chest: Lung bases demonstrate no acute airspace disease. Hepatobiliary: Nonvisualized gallbladder likely due to prior cholecystectomy. Prominent extrahepatic common bile duct measures up to 16 mm. No intra hepatic biliary dilatation. Pancreas: Unremarkable. No pancreatic ductal dilatation or surrounding inflammatory changes. Spleen: Normal in size without focal abnormality. Adrenals/Urinary Tract: Adrenal glands are normal. Small left kidney stone versus mild intrarenal vascular calcification. Severe hydronephrosis of the right kidney without hydroureter. Mildly dense debris within the right renal collecting system posteriorly, series 2, image 50. negative for ureteral stone. Bladder is normal. Stranding at the right renal pelvis. Mild bilateral perinephric nonspecific stranding. 10 mm possible indeterminate lesion mid pole left kidney, series 2, image 37. No significant excretion of contrast from right kidney on delayed views. Stomach/Bowel: The stomach is nonenlarged. No dilated small bowel. No acute bowel wall thickening. Vascular/Lymphatic: Moderate severe aortic atherosclerosis. No aneurysm. No suspicious lymph nodes. Mass effect on IVC by large right renal pelvis but with flow enhancement present.  Reproductive: Uterus and bilateral adnexa are unremarkable. Other: Negative for pelvic effusion or free Musculoskeletal: Degenerative changes of the lumbar spine. No acute osseous abnormality IMPRESSION: 1. 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 Nonspecific perinephric fat stranding though moderate stranding adjacent to the right renal pelvis which could be due to infection or inflammation. No apparent contrast excretion from right kidney on delayed views consistent with decreased renal function. 2. Small nonobstructing left kidney stone. 3. 10 mm indeterminate lesion mid pole left kidney. When the patient is clinically stable and able to follow directions and hold their breath (preferably as an outpatient) further evaluation with dedicated abdominal MRI should be considered. 4. Prominent extrahepatic common bile duct likely due to prior cholecystectomy. Correlate with LFTs. 5. Aortic atherosclerosis. Aortic Atherosclerosis (ICD10-I70.0). Electronically Signed   By: KDonavan FoilM.D.   On: 01/04/2022 18:15   DG Abdomen 1 View  Result Date: 01/03/2022 CLINICAL DATA:  Nausea EXAM: ABDOMEN - 1 VIEW COMPARISON:  None Available. FINDINGS: The bowel gas pattern is normal. No radio-opaque calculi or other significant radiographic abnormality are seen. IMPRESSION: Negative. Electronically Signed   By: KLennette Bihari  [place] M.D.   On: 01/03/2022 02:00    Labs:  CBC: Recent Labs    01/02/22 1859 01/04/22 1257 01/05/22 0453  WBC 15.2* 17.1* 22.9*  HGB 12.5 11.8* 12.1  HCT 36.0 34.4* 35.2*  PLT 292 290 426*    COAGS: Recent Labs    01/05/22 0453  INR 1.2    BMP: Recent Labs    01/02/22 1859 01/04/22 1257 01/05/22 0453  NA 132* 132* 132*  K 3.0* 3.7 3.4*  CL 92* 98 98  CO2 '25 22 23  '$ GLUCOSE 164* 207* 146*  BUN 26* 26* 28*  CALCIUM 8.8* 8.6* 8.8*  CREATININE 1.46* 1.38* 1.73*  GFRNONAA 37* 40* 30*    LIVER  FUNCTION TESTS: Recent Labs    01/04/22 1257 01/05/22 0453  BILITOT 2.0* 1.7*  AST 23 43*  ALT 22 29  ALKPHOS 83 101  PROT 6.7 7.3  ALBUMIN 2.9* 2.9*   Assessment and Plan:  Right hydronephrosis --concern for UPJ obstruction --IR consulted for PCN --Patient and daughter in agreement.  She is appropriately NPO and not on thinners --will proceed with planned R PCN, possible right perirenal cyst drainage.  Risks and benefits of right PCN placement was discussed with the patient including, but not limited to, infection, bleeding, significant bleeding causing loss or decrease in renal function or damage to adjacent structures.   All of the patient's questions were answered, patient is agreeable to proceed.  Consent signed and in chart.  Thank you for this interesting consult.  I greatly enjoyed meeting Melissa Leon and look forward to participating in their care.  A copy of this report was sent to the requesting provider on this date.  Electronically Signed: Pasty Spillers, PA 01/05/2022, 9:54 AM   I spent a total of 40 Minutes in face to face in clinical consultation, greater than 50% of which was counseling/coordinating care for hydronephrosis

## 2022-01-06 ENCOUNTER — Telehealth (HOSPITAL_BASED_OUTPATIENT_CLINIC_OR_DEPARTMENT_OTHER): Payer: Self-pay | Admitting: *Deleted

## 2022-01-06 DIAGNOSIS — N12 Tubulo-interstitial nephritis, not specified as acute or chronic: Secondary | ICD-10-CM | POA: Diagnosis not present

## 2022-01-06 LAB — CBC
HCT: 31.9 % — ABNORMAL LOW (ref 36.0–46.0)
Hemoglobin: 10 g/dL — ABNORMAL LOW (ref 12.0–15.0)
MCH: 30.2 pg (ref 26.0–34.0)
MCHC: 31.3 g/dL (ref 30.0–36.0)
MCV: 96.4 fL (ref 80.0–100.0)
Platelets: 290 10*3/uL (ref 150–400)
RBC: 3.31 MIL/uL — ABNORMAL LOW (ref 3.87–5.11)
RDW: 13.1 % (ref 11.5–15.5)
WBC: 16.2 10*3/uL — ABNORMAL HIGH (ref 4.0–10.5)
nRBC: 0 % (ref 0.0–0.2)

## 2022-01-06 LAB — GLUCOSE, CAPILLARY
Glucose-Capillary: 64 mg/dL — ABNORMAL LOW (ref 70–99)
Glucose-Capillary: 81 mg/dL (ref 70–99)
Glucose-Capillary: 136 mg/dL — ABNORMAL HIGH (ref 70–99)
Glucose-Capillary: 129 mg/dL — ABNORMAL HIGH (ref 70–99)

## 2022-01-06 LAB — BASIC METABOLIC PANEL
Anion gap: 9 (ref 5–15)
BUN: 34 mg/dL — ABNORMAL HIGH (ref 8–23)
CO2: 20 mmol/L — ABNORMAL LOW (ref 22–32)
Calcium: 8.2 mg/dL — ABNORMAL LOW (ref 8.9–10.3)
Chloride: 106 mmol/L (ref 98–111)
Creatinine, Ser: 2.02 mg/dL — ABNORMAL HIGH (ref 0.44–1.00)
GFR, Estimated: 25 mL/min — ABNORMAL LOW (ref >60)
Glucose, Bld: 89 mg/dL (ref 70–99)
Potassium: 3.3 mmol/L — ABNORMAL LOW (ref 3.5–5.1)
Sodium: 135 mmol/L (ref 135–145)

## 2022-01-06 LAB — HEMOGLOBIN A1C
Hgb A1c MFr Bld: 5.6 % (ref 4.8–5.6)
Mean Plasma Glucose: 114 mg/dL

## 2022-01-06 LAB — OSMOLALITY: Osmolality: 285 mosm/kg (ref 275–295)

## 2022-01-06 MED ORDER — DEXTROSE-NACL 5-0.9 % IV SOLN: INTRAVENOUS | Status: DC

## 2022-01-06 MED ORDER — POTASSIUM CHLORIDE CRYS ER 20 MEQ PO TBCR
40.0000 meq | EXTENDED_RELEASE_TABLET | Freq: Once | ORAL | Status: AC
  Administered 2022-01-06: 40 meq via ORAL
  Filled 2022-01-06: qty 2

## 2022-01-06 NOTE — Progress Notes (Addendum)
Subjective: S/p R PCN with IR. Feels weak. Overall improved from yesterday.   Objective: Vital signs in last 24 hours: Temp:  [97.5 F (36.4 C)-98.6 F (37 C)] 97.5 F (36.4 C) (12/19 0238) Pulse Rate:  [84-112] 84 (12/19 0238) Resp:  [14-29] 20 (12/19 0238) BP: (99-164)/(60-92) 113/69 (12/19 0238) SpO2:  [92 %-100 %] 97 % (12/19 0238)  Intake/Output from previous day: 12/18 0701 - 12/19 0700 In: -  Out: 250 [Urine:250] Intake/Output this shift: No intake/output data recorded.  Physical Exam:  General: Alert and oriented CV: RRR Lungs: Clear Abdomen: Soft, ND GU: voiding spontaneously. R PCN with thin strawberry output Ext: NT, No erythema  Lab Results: Recent Labs    01/04/22 1257 01/05/22 0453 01/06/22 0622  HGB 11.8* 12.1 10.0*  HCT 34.4* 35.2* 31.9*    BMET Recent Labs    01/05/22 0453 01/06/22 0622  NA 132* 135  K 3.4* 3.3*  CL 98 106  CO2 23 20*  GLUCOSE 146* 89  BUN 28* 34*  CREATININE 1.73* 2.02*  CALCIUM 8.8* 8.2*      Studies/Results: IR NEPHROSTOMY PLACEMENT RIGHT  Result Date: 01/05/2022 INDICATION: 76 year old female referred for drainage of peripelvic renal cyst versus PCN for UPJ obstruction EXAM: IR NEPHROSTOMY PLACEMENT RIGHT COMPARISON:  CT 01/04/2022 MEDICATIONS: None ANESTHESIA/SEDATION: Fentanyl 100 mcg IV; Versed 2.0 mg IV Moderate Sedation Time:  14 minutes The patient was continuously monitored during the procedure by the interventional radiology nurse under my direct supervision. CONTRAST:  47m OMNIPAQUE IOHEXOL 300 MG/ML SOLN - administered into the collecting system(s) FLUOROSCOPY TIME:  Fluoroscopy Time: 0 minutes 36 seconds (7 mGy). COMPLICATIONS: None PROCEDURE: Informed written consent was obtained from the patient after a thorough discussion of the procedural risks, benefits and alternatives. All questions were addressed. Maximal Sterile Barrier Technique was utilized including caps, mask, sterile gowns, sterile gloves,  sterile drape, hand hygiene and skin antiseptic. A timeout was performed prior to the initiation of the procedure. Patient positioned prone position on the fluoroscopy table. Ultrasound survey of the right flank was performed with images stored and sent to PACs. The patient was then prepped and draped in the usual sterile fashion. 1% lidocaine was used to anesthetize the skin and subcutaneous tissues for local anesthesia. A Chiba needle was then used to access a posterior inferior calyx with ultrasound guidance. With spontaneous urine returned through the needle, passage of an 018 micro wire into the collecting system was performed under fluoroscopy. A small incision was made with an 11 blade scalpel, and the needle was removed from the wire. An Accustick system was then advanced over the wire into the collecting system under fluoroscopy. The metal stiffener and inner dilator were removed, and then a sample of fluid was aspirated through the 4 French outer sheath. Bentson wire was passed into the collecting system and the sheath removed. Ten French dilation of the soft tissues was performed. Using modified Seldinger technique, a 10 French pigtail catheter drain was placed over the Bentson wire. Wire and inner stiffener removed, and the pigtail was formed in the collecting system. Evacuation of a proximally 510 cc of frankly purulent urine was performed. Contrast injection confirmed position of the catheter. Patient tolerated the procedure well and remained hemodynamically stable throughout. No complications were encountered and no significant blood loss encountered IMPRESSION: Status post image guided placement of right-sided PCN. Given the delayed nephrogram of the prior CT, the overall configuration, and that the injection at the end of the procedure today connected  with the collecting system, this is favored to represent a UPJ obstruction. Signed, Dulcy Fanny. Nadene Rubins, RPVI [facility] Electronically Signed   By: Corrie Mckusick D.O.   On: 01/05/2022 11:38   CT ABDOMEN PELVIS W CONTRAST  Result Date: 01/04/2022 CLINICAL DATA:  Sepsis weakness EXAM: CT ABDOMEN AND PELVIS WITH CONTRAST TECHNIQUE: Multidetector CT imaging of the abdomen and pelvis was performed using the standard protocol following bolus administration of intravenous contrast. RADIATION DOSE REDUCTION: This exam was performed according to the departmental dose-optimization program which includes automated exposure control, adjustment of the mA and/or kV according to patient size and/or use of iterative reconstruction technique. CONTRAST:  162m OMNIPAQUE IOHEXOL 300 MG/ML  SOLN COMPARISON:  Radiograph 01/04/2019 FINDINGS: Lower chest: Lung bases demonstrate no acute airspace disease. Hepatobiliary: Nonvisualized gallbladder likely due to prior cholecystectomy. Prominent extrahepatic common bile duct measures up to 16 mm. No intra hepatic biliary dilatation. Pancreas: Unremarkable. No pancreatic ductal dilatation or surrounding inflammatory changes. Spleen: Normal in size without focal abnormality. Adrenals/Urinary Tract: Adrenal glands are normal. Small left kidney stone versus mild intrarenal vascular calcification. Severe hydronephrosis of the right kidney without hydroureter. Mildly dense debris within the right renal collecting system posteriorly, series 2, image 50. negative for ureteral stone. Bladder is normal. Stranding at the right renal pelvis. Mild bilateral perinephric nonspecific stranding. 10 mm possible indeterminate lesion mid pole left kidney, series 2, image 37. No significant excretion of contrast from right kidney on delayed views. Stomach/Bowel: The stomach is nonenlarged. No dilated small bowel. No acute bowel wall thickening. Vascular/Lymphatic: Moderate severe aortic atherosclerosis. No aneurysm. No suspicious lymph nodes. Mass effect on IVC by large right renal  pelvis but with flow enhancement present. Reproductive: Uterus and bilateral adnexa are unremarkable. Other: Negative for pelvic effusion or free Musculoskeletal: Degenerative changes of the lumbar spine. No acute osseous abnormality IMPRESSION: 1. 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 Nonspecific perinephric fat stranding though moderate stranding adjacent to the right renal pelvis which could be due to infection or inflammation. No apparent contrast excretion from right kidney on delayed views consistent with decreased renal function. 2. Small nonobstructing left kidney stone. 3. 10 mm indeterminate lesion mid pole left kidney. When the patient is clinically stable and able to follow directions and hold their breath (preferably as an outpatient) further evaluation with dedicated abdominal MRI should be considered. 4. Prominent extrahepatic common bile duct likely due to prior cholecystectomy. Correlate with LFTs. 5. Aortic atherosclerosis. Aortic Atherosclerosis (ICD10-I70.0). Electronically Signed   By: KDonavan FoilM.D.   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 Will plan for outpatient lasix renal scan, discussed this with patient and daughter this morning UTI Currently on CTX Follow-up cultures, cater accordingly Recommend 10-14 day course for complicated UTI AKI Cr continues to uptrend s/p PCN placement Anticipate improvement over the next 24h Continue to trend Cr and track I/Os 4. Indeterminant renal lesion: Seen on CT A/P 12/17. Will plan for non-urgent follow up imaging, likely MRI outpatient.   LOS: 2 days   MLamar Laundry12/19/2023, 7:54 AM I have seen and  examined the patient and agree with the above assessment and plan.  Agree with above. Continue IV abx. Tailor as able. Trend  creatinine. Will plan for dedicated renal imaging and lasix renal scan outpatient. Will arrange followup.  Matt R. [facility]  Pager: [phone]

## 2022-01-06 NOTE — Progress Notes (Signed)
PROGRESS NOTE Kamyah V Schauf  GYJ:[id number] DOB: Jan 25, 1945 DOA: 01/04/2022 PCP: Katherina Mires, MD   Brief Narrative/Hospital Course: 76 year old female with T2DM hypertension presents to the ED for progressively worsening right flank pain feeling unwell for few weeks with low energy, decreased appetite, intermittent nausea.  She has had subjective fever.  She was recently started on Ozempic and having some intolerance.  In the ED She was seen in the [place] ED on 12/15 found to have UTI discharged on Keflex despite which she continued to have worsening symptoms.  Urine culture showed E. coli from that visit. Seen in the ED again with low-grade fever, vitals fairly stable Labs showed worsening WBC count 721 from 15.2 on 12/15, creatinine about the same 1.3 , Lactic acidosis 2.3 UA with WBC more than 50 positive LE negative any urine blood culture sent, imaging obtained with x-ray abdomen CT abdomen with contrast> severe hydronephrosis of right kidney possible UPJ obstruction, perinephric fat stranding small left kidney stone lesion on the left kidney midpole MRI advised. Urology consulted,placed on antibiotics and admitted  Subjective: Seen and examined this morning.   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  Urine cx w/ pansensitive E. coli (01/02/22).currently hemodynamically stable, leukocytosis downtrending, lactic acidosis resolved.culture from IR sample from nephrostomy  shows gram-negative rods,  Blood culture no growth so far.continue plan as per urology.  Continue current IV antibiotics IV fluids.   Recent Labs  Lab 01/02/22 1859 01/04/22 1257 01/04/22 1714 01/04/22 1956 01/05/22 0453 01/06/22 0622  WBC 15.2* 17.1*  --   --  22.9* 16.2*  LATICACIDVEN  --   --  2.3* 1.5  --   --     Acute kidney injury on CKD stage IIIb baseline creatinine around 1.3 in December. AKI multifactorial due to  obstructive nephropathy,sepsis ,CIN?. monitor intake output minimize nephrotoxic medication.Patient did receive contrast .  If further worsening consult nephrology.  Continue IV with hydration , avoid hypotension NSAIDs. Intake/Output Summary (Last 24 hours) at 01/06/2022 1100 Last data filed at 01/05/2022 2116 Gross per 24 hour  Intake --  Output 250 ml  Net -250 ml    Nausea: Symptomatic management Hypokalemia replace po and recheck. Hypomagnesemia: Improved Acute hyponatremia: Continue gentle IV fluids DM2 with hyperglycemia :hold home Ozempic.Keeping on sliding scale insulin for now and monitor Recent Labs  Lab 01/04/22 1257 01/04/22 1300 01/05/22 1217 01/05/22 1647 01/05/22 2109 01/06/22 0756 01/06/22 0911  GLUCAP  --    < > 133* 154* 175* 64* 81  HGBA1C 5.6  --   --   --   --   --   --    < > = values in this interval not displayed.    Essential hypertension: BP stable holding antihypertensives.   DVT prophylaxis: SCDs Start: 01/04/22 1928 Code Status:   Code Status: Full Code Family Communication: plan of care discussed with patient at bedside. Family updated at bed-site  Patient status is: inpatient  because of Sepsis Level of care: Telemetry  Dispo: The patient is  from: home alone , daughter lives close by             Anticipated disposition: >2-3 days Objective: Vitals last 24 hrs: Vitals:   01/05/22 1600 01/05/22 1800 01/05/22 2107 01/06/22 0238  BP: 106/60 110/63 99/62 113/69  Pulse: (!) 108 (!) 107 98 84  Resp: '16 17 19 20   '$ Temp:  98 F (36.7 C) 98 F (36.7 C) (!) 97.5 F (36.4 C)  TempSrc:  Oral    SpO2: 96% 95% 95% 97%  Weight:      Height:       Weight change:   Physical Examination: General exam: AAox3, pleasant weak,older appearing HEENT:Oral mucosa moist, Ear/Nose WNL grossly, dentition normal. Respiratory system: bilaterally clear, no use of accessory muscle Cardiovascular system: S1 & S2 +, regular rate. Gastrointestinal system: Abdomen soft, mildly tender right abdomen, NT,ND,BS+.  Percutaneous nephrostomy tube in place with pinkish blood tinged urine Nervous System:Alert, awake, moving extremities and grossly nonfocal Extremities: LE ankle edema neg, lower extremities warm Skin: No rashes,no icterus. MSK: Normal muscle bulk,tone, power   Medications reviewed:  Scheduled Meds:  insulin aspart  0-6 Units Subcutaneous TID WC   Continuous Infusions:  cefTRIAXone (ROCEPHIN)  IV 2 g (01/05/22 1810)   dextrose 5 % and 0.9% NaCl 125 mL/hr at 01/06/22 1007    Diet Order             Diet Carb Modified Fluid consistency: Thin; Room service appropriate? Yes  Diet effective now                  Intake/Output Summary (Last 24 hours) at 01/06/2022 1057 Last data filed at 01/05/2022 2116 Gross per 24 hour  Intake --  Output 250 ml  Net -250 ml   Net IO Since Admission: -250 mL [01/06/22 1057]  Wt Readings from Last 3 Encounters:  01/04/22 84 kg  06/03/21 83.9 kg  05/30/21 83.9 kg     Unresulted Labs (From admission, onward)     Start     Ordered   01/06/22 7989  Basic metabolic panel  Daily,   R      01/05/22 0747   01/06/22 0500  CBC  Daily,   R      01/05/22 0747          Data Reviewed: I have personally reviewed following labs and imaging studies CBC: Recent Labs  Lab 01/02/22 1859 01/04/22 1257 01/05/22 0453 01/06/22 0622  WBC 15.2* 17.1* 22.9* 16.2*  NEUTROABS 13.0* 15.4* 20.2*  --   HGB 12.5 11.8* 12.1 10.0*  HCT 36.0 34.4* 35.2* 31.9*  MCV 89.8 91.5 91.0  96.4  PLT 292 290 426* 211  Basic Metabolic Panel: Recent Labs  Lab 01/02/22 1859 01/04/22 1257 01/04/22 1956 01/05/22 0453 01/06/22 0622  NA 132* 132*  --  132* 135  K 3.0* 3.7  --  3.4* 3.3*  CL 92* 98  --  98 106  CO2 25 22  --  23 20*  GLUCOSE 164* 207*  --  146* 89  BUN 26* 26*  --  28* 34*  CREATININE 1.46* 1.38*  --  1.73* 2.02*  CALCIUM 8.8* 8.6*  --  8.8* 8.2*  MG  --   --  1.6* 2.2  --   GFR: Estimated Creatinine Clearance: 26.4 mL/min (A) (by C-G formula based on SCr of 2.02 mg/dL (H)). Liver Function Tests: Recent Labs  Lab 01/04/22 1257 01/05/22 0453  AST 23 43*  ALT  22 29  ALKPHOS 83 101  BILITOT 2.0* 1.7*  PROT 6.7 7.3  ALBUMIN 2.9* 2.9*   Recent Labs  Lab 01/05/22 0453  LIPASE 44  CBG: Recent Labs  Lab 01/05/22 1217 01/05/22 1647 01/05/22 2109 01/06/22 0756 01/06/22 0911  GLUCAP 133* 154* 175* 64* 81  Sepsis Labs: Recent Labs  Lab 01/04/22 1714 01/04/22 1956  LATICACIDVEN 2.3* 1.5   Recent Results (from the past 240 hour(s))  Resp panel by RT-PCR (RSV, Flu A&B, Covid) Anterior Nasal Swab     Status: None   Collection Time: 01/02/22  6:59 PM   Specimen: Anterior Nasal Swab  Result Value Ref Range Status   SARS Coronavirus 2 by RT PCR NEGATIVE NEGATIVE Final    Comment: (NOTE) SARS-CoV-2 target nucleic acids are NOT DETECTED.  The SARS-CoV-2 RNA is generally detectable in upper respiratory specimens during the acute phase of infection. The lowest concentration of SARS-CoV-2 viral copies this assay can detect is 138 copies/mL. A negative result does not preclude SARS-Cov-2 infection and should not be used as the sole basis for treatment or other patient management decisions. A negative result may occur with  improper specimen collection/handling, submission of specimen other than nasopharyngeal swab, presence of viral mutation(s) within the areas targeted by this assay, and inadequate number of viral copies(<138 copies/mL). A  negative result must be combined with clinical observations, patient history, and epidemiological information. The expected result is Negative.  Fact Sheet for Patients:  EntrepreneurPulse.com.au  Fact Sheet for Healthcare Providers:  IncredibleEmployment.be  This test is no t yet approved or cleared by the [place] FDA and  has been authorized for detection and/or diagnosis of SARS-CoV-2 by FDA under an Emergency Use Authorization (EUA). This EUA will remain  in effect (meaning this test can be used) for the duration of the COVID-19 declaration under Section 564(b)(1) of the Act, 21 U.S.C.section 360bbb-3(b)(1), unless the authorization is terminated  or revoked sooner.       Influenza A by PCR NEGATIVE NEGATIVE Final   Influenza B by PCR NEGATIVE NEGATIVE Final    Comment: (NOTE) The Xpert Xpress SARS-CoV-2/FLU/RSV plus assay is intended as an aid in the diagnosis of influenza from Nasopharyngeal swab specimens and should not be used as a sole basis for treatment. Nasal washings and aspirates are unacceptable for Xpert Xpress SARS-CoV-2/FLU/RSV testing.  Fact Sheet for Patients: EntrepreneurPulse.com.au  Fact Sheet for Healthcare Providers: IncredibleEmployment.be  This test is not yet approved or cleared by the [place] FDA and has been authorized for detection and/or diagnosis of SARS-CoV-2 by FDA under an Emergency Use Authorization (EUA). This EUA will remain in effect (meaning this test can be used) for the duration of the COVID-19 declaration under Section 564(b)(1) of the Act, 21 U.S.C. section 360bbb-3(b)(1), unless the authorization is terminated or revoked.     Resp Syncytial Virus by PCR NEGATIVE NEGATIVE Final    Comment: (NOTE) Fact Sheet for Patients: EntrepreneurPulse.com.au  Fact Sheet for Healthcare  Providers: IncredibleEmployment.be  This test is not yet approved or cleared by the [place] FDA and has been authorized for detection and/or diagnosis of SARS-CoV-2 by FDA under an Emergency Use Authorization (EUA). This EUA will remain in effect (meaning this test can be used) for the duration of the COVID-19 declaration under Section 564(b)(1) of the Act, 21 U.S.C. section 360bbb-3(b)(1), unless the authorization is terminated or revoked.  Performed at [facility], [address] [place], [place] [address]   Urine Culture  Status: Abnormal   Collection Time: 01/02/22  6:59 PM   Specimen: Urine, Clean Catch  Result Value Ref Range Status   Specimen Description   Final    URINE, CLEAN CATCH Performed at [facility], [address], [place], [place] [address]    Special Requests   Final    NONE Performed at [facility], [address] [place], [place] [address]    Culture >=100,000 COLONIES/mL ESCHERICHIA COLI (A)  Final   Report Status 01/05/2022 FINAL  Final   Organism ID, Bacteria ESCHERICHIA COLI (A)  Final      Susceptibility   Escherichia coli - MIC*    AMPICILLIN <=2 SENSITIVE Sensitive     CEFAZOLIN <=4 SENSITIVE Sensitive     CEFEPIME <=0.12 SENSITIVE Sensitive     CEFTRIAXONE <=0.25 SENSITIVE Sensitive     CIPROFLOXACIN <=0.25 SENSITIVE Sensitive     GENTAMICIN <=1 SENSITIVE Sensitive     IMIPENEM <=0.25 SENSITIVE Sensitive     NITROFURANTOIN <=16 SENSITIVE Sensitive     TRIMETH/SULFA <=20 SENSITIVE Sensitive     AMPICILLIN/SULBACTAM <=2 SENSITIVE Sensitive     PIP/TAZO <=4 SENSITIVE Sensitive     * >=100,000 COLONIES/mL ESCHERICHIA COLI  Urine Culture     Status: Abnormal   Collection Time: 01/04/22  4:37 PM   Specimen: Urine, Clean Catch  Result Value Ref Range Status   Specimen Description   Final    URINE, CLEAN CATCH Performed at [facility],  [place] [address], [place], [place] [address]    Special Requests   Final    NONE Performed at [facility], [place] [address], [place], [place] [address]    Culture (A)  Final    <10,000 COLONIES/mL INSIGNIFICANT GROWTH Performed at [facility] [address] [place], [place] [address]    Report Status 01/05/2022 FINAL  Final  Blood culture (routine x 2)     Status: None (Preliminary result)   Collection Time: 01/04/22  5:14 PM   Specimen: BLOOD  Result Value Ref Range Status   Specimen Description   Final    BLOOD LEFT ANTECUBITAL Performed at [facility] [address], [place], [place] [address]    Special Requests   Final    BOTTLES DRAWN AEROBIC AND ANAEROBIC Blood Culture adequate volume Performed at [facility] [address], [place], [place] [address]    Culture   Final    NO GROWTH 2 DAYS Performed at [facility] [address] [place], [place] [address]    Report Status PENDING  Incomplete  Blood culture (routine x 2)     Status: None (Preliminary result)   Collection Time: 01/04/22  5:14 PM   Specimen: BLOOD  Result Value Ref Range Status   Specimen Description   Final    BLOOD RIGHT ANTECUBITAL Performed at [facility] [address], [place], [place] [address]    Special Requests   Final    BOTTLES DRAWN AEROBIC ONLY Blood Culture results may not be optimal due to an inadequate volume of blood received in culture bottles Performed at [facility] [address], [place], [place] [address]    Culture   Final    NO GROWTH 2 DAYS Performed at [facility] [address], [place], [place] [address]    Report Status PENDING  Incomplete  Resp panel by RT-PCR (RSV, Flu A&B, Covid) Anterior  Nasal Swab     Status: None   Collection Time: 01/04/22  5:14 PM   Specimen: Anterior Nasal Swab  Result Value Ref Range Status   SARS Coronavirus 2 by  RT PCR NEGATIVE NEGATIVE Final    Comment: (NOTE) SARS-CoV-2 target nucleic acids are NOT DETECTED.  The SARS-CoV-2 RNA is generally detectable in upper respiratory specimens during the acute phase of infection. The lowest concentration of SARS-CoV-2 viral copies this assay can detect is 138 copies/mL. A negative result does not preclude SARS-Cov-2 infection and should not be used as the sole basis for treatment or other patient management decisions. A negative result may occur with  improper specimen collection/handling, submission of specimen other than nasopharyngeal swab, presence of viral mutation(s) within the areas targeted by this assay, and inadequate number of viral copies(<138 copies/mL). A negative result must be combined with clinical observations, patient history, and epidemiological information. The expected result is Negative.  Fact Sheet for Patients:  EntrepreneurPulse.com.au  Fact Sheet for Healthcare Providers:  IncredibleEmployment.be  This test is no t yet approved or cleared by the [place] FDA and  has been authorized for detection and/or diagnosis of SARS-CoV-2 by FDA under an Emergency Use Authorization (EUA). This EUA will remain  in effect (meaning this test can be used) for the duration of the COVID-19 declaration under Section 564(b)(1) of the Act, 21 U.S.C.section 360bbb-3(b)(1), unless the authorization is terminated  or revoked sooner.       Influenza A by PCR NEGATIVE NEGATIVE Final   Influenza B by PCR NEGATIVE NEGATIVE Final    Comment: (NOTE) The Xpert Xpress SARS-CoV-2/FLU/RSV plus assay is intended as an aid in the diagnosis of influenza from Nasopharyngeal swab specimens and should not be used as a sole basis for treatment. Nasal washings and aspirates are unacceptable for Xpert Xpress SARS-CoV-2/FLU/RSV testing.  Fact Sheet for Patients: EntrepreneurPulse.com.au  Fact Sheet  for Healthcare Providers: IncredibleEmployment.be  This test is not yet approved or cleared by the [place] FDA and has been authorized for detection and/or diagnosis of SARS-CoV-2 by FDA under an Emergency Use Authorization (EUA). This EUA will remain in effect (meaning this test can be used) for the duration of the COVID-19 declaration under Section 564(b)(1) of the Act, 21 U.S.C. section 360bbb-3(b)(1), unless the authorization is terminated or revoked.     Resp Syncytial Virus by PCR NEGATIVE NEGATIVE Final    Comment: (NOTE) Fact Sheet for Patients: EntrepreneurPulse.com.au  Fact Sheet for Healthcare Providers: IncredibleEmployment.be  This test is not yet approved or cleared by the [place] FDA and has been authorized for detection and/or diagnosis of SARS-CoV-2 by FDA under an Emergency Use Authorization (EUA). This EUA will remain in effect (meaning this test can be used) for the duration of the COVID-19 declaration under Section 564(b)(1) of the Act, 21 U.S.C. section 360bbb-3(b)(1), unless the authorization is terminated or revoked.  Performed at [facility], [place] [address] [place], [place] [address]   Aerobic/Anaerobic Culture w Gram Stain (surgical/deep wound)     Status: None (Preliminary result)   Collection Time: 01/05/22 11:26 AM   Specimen: Kidney; Urine  Result Value Ref Range Status   Specimen Description   Final    KIDNEY Performed at [facility] [address] [place], [place] [address]    Special Requests   Final    NONE Performed at [facility], [place] [address], [place], [place] [address]    Gram Stain  Final    MODERATE WBC PRESENT, PREDOMINANTLY PMN RARE GRAM NEGATIVE RODS    Culture   Final    ABUNDANT GRAM NEGATIVE RODS IDENTIFICATION AND SUSCEPTIBILITIES TO FOLLOW Performed at [facility], [place] [address] [place], [place] [address]    Report Status PENDING  Incomplete    Antimicrobials: Anti-infectives (From admission, onward)    Start     Dose/Rate Route Frequency Ordered Stop   01/05/22 1700  cefTRIAXone (ROCEPHIN) 2 g in sodium chloride 0.9 % 100 mL IVPB        2 g 200 mL/hr over 30 Minutes Intravenous Every 24 hours 01/05/22 0752     01/05/22 1600  cefTRIAXone (ROCEPHIN) 1 g in sodium chloride 0.9 % 100 mL IVPB  Status:  Discontinued        1 g 200 mL/hr over 30 Minutes Intravenous Every 24 hours 01/04/22 1929 01/05/22 0747   01/05/22 1130  levofloxacin (LEVAQUIN) IVPB 500 mg        500 mg 100 mL/hr over 60 Minutes Intravenous  Once 01/05/22 1049 01/05/22 1431   01/05/22 0747  cefTRIAXone (ROCEPHIN) 2 g in sodium chloride 0.9 % 100 mL IVPB  Status:  Discontinued        2 g 200 mL/hr over 30 Minutes Intravenous Every 24 hours 01/05/22 0747 01/05/22 0752   01/04/22 1645  cefTRIAXone (ROCEPHIN) 1 g in sodium chloride 0.9 % 100 mL IVPB        1 g 200 mL/hr over 30 Minutes Intravenous  Once 01/04/22 1637 01/04/22 1817      Culture/Microbiology    Component Value Date/Time   SDES  01/05/2022 1126    KIDNEY Performed at [facility], [place] [address] [place], [place] [address]    SPECREQUEST  01/05/2022 1126    NONE Performed at [facility], [place] [address] [place], [place] [address]    CULT  01/05/2022 1126    ABUNDANT GRAM NEGATIVE RODS IDENTIFICATION AND SUSCEPTIBILITIES TO FOLLOW Performed at [facility], [place] [address] [place],  [address]    REPTSTATUS PENDING 01/05/2022 1126  Other culture-see note  Radiology Studies: IR NEPHROSTOMY PLACEMENT RIGHT  Result Date: 01/05/2022 INDICATION: 76 year old female referred for drainage of peripelvic renal cyst versus PCN for UPJ obstruction EXAM: IR NEPHROSTOMY PLACEMENT RIGHT COMPARISON:  CT 01/04/2022 MEDICATIONS: None ANESTHESIA/SEDATION: Fentanyl 100 mcg IV; Versed 2.0 mg  IV Moderate Sedation Time:  14 minutes The patient was continuously monitored during the procedure by the interventional radiology nurse under my direct supervision. CONTRAST:  18m OMNIPAQUE IOHEXOL 300 MG/ML SOLN - administered into the collecting system(s) FLUOROSCOPY TIME:  Fluoroscopy Time: 0 minutes 36 seconds (7 mGy). COMPLICATIONS: None PROCEDURE: Informed written consent was obtained from the patient after a thorough discussion of the procedural risks, benefits and alternatives. All questions were addressed. Maximal Sterile Barrier Technique was utilized including caps, mask, sterile gowns, sterile gloves, sterile drape, hand hygiene and skin antiseptic. A timeout was performed prior to the initiation of the procedure. Patient positioned prone position on the fluoroscopy table. Ultrasound survey of the right flank was performed with images stored and sent to PACs. The patient was then prepped and draped in the usual sterile fashion. 1% lidocaine was used to anesthetize the skin and subcutaneous tissues for local anesthesia. A Chiba needle was then used to access a posterior inferior calyx with ultrasound guidance. With spontaneous urine returned through the needle, passage of an 018 micro wire  into the collecting system was performed under fluoroscopy. A small incision was made with an 11 blade scalpel, and the needle was removed from the wire. An Accustick system was then advanced over the wire into the collecting system under fluoroscopy. The metal stiffener and inner dilator were removed, and then a sample of fluid was aspirated through the 4 French outer sheath. Bentson wire was passed into the collecting system and the sheath removed. Ten French dilation of the soft tissues was performed. Using modified Seldinger technique, a 10 French pigtail catheter drain was placed over the Bentson wire. Wire and inner stiffener removed, and the pigtail was formed in the collecting system. Evacuation of a  proximally 510 cc of frankly purulent urine was performed. Contrast injection confirmed position of the catheter. Patient tolerated the procedure well and remained hemodynamically stable throughout. No complications were encountered and no significant blood loss encountered IMPRESSION: Status post image guided placement of right-sided PCN. 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 Signed, Dulcy Fanny. Nadene Rubins, RPVI [facility] Radiology Electronically Signed   By: Corrie Mckusick D.O.   On: 01/05/2022 11:38   CT ABDOMEN PELVIS W CONTRAST  Result Date: 01/04/2022 CLINICAL DATA:  Sepsis weakness EXAM: CT ABDOMEN AND PELVIS WITH CONTRAST TECHNIQUE: Multidetector CT imaging of the abdomen and pelvis was performed using the standard protocol following bolus administration of intravenous contrast. RADIATION DOSE REDUCTION: This exam was performed according to the departmental dose-optimization program which includes automated exposure control, adjustment of the mA and/or kV according to patient size and/or use of iterative reconstruction technique. CONTRAST:  162m OMNIPAQUE IOHEXOL 300 MG/ML  SOLN COMPARISON:  Radiograph 01/04/2019 FINDINGS: Lower chest: Lung bases demonstrate no acute airspace disease. Hepatobiliary: Nonvisualized gallbladder likely due to prior cholecystectomy. Prominent extrahepatic common bile duct measures up to 16 mm. No intra hepatic biliary dilatation. Pancreas: Unremarkable. No pancreatic ductal dilatation or surrounding inflammatory changes. Spleen: Normal in size without focal abnormality. Adrenals/Urinary Tract: Adrenal glands are normal. Small left kidney stone versus mild intrarenal vascular calcification. Severe hydronephrosis of the right kidney without  hydroureter. Mildly dense debris within the right renal collecting system posteriorly, series 2, image 50. negative for ureteral stone. Bladder is normal. Stranding at the right renal pelvis. Mild bilateral perinephric nonspecific stranding. 10 mm possible indeterminate lesion mid pole left kidney, series 2, image 37. No significant excretion of contrast from right kidney on delayed views. Stomach/Bowel: The stomach is nonenlarged. No dilated small bowel. No acute bowel wall thickening. Vascular/Lymphatic: Moderate severe aortic atherosclerosis. No aneurysm. No suspicious lymph nodes. Mass effect on IVC by large right renal pelvis but with flow enhancement present. Reproductive: Uterus and bilateral adnexa are unremarkable. Other: Negative for pelvic effusion or free Musculoskeletal: Degenerative changes of the lumbar spine. No acute osseous abnormality IMPRESSION: 1. 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 Nonspecific perinephric fat stranding though moderate stranding adjacent to the right renal pelvis which could be due to infection or inflammation. No apparent contrast excretion from right kidney on delayed views consistent with decreased renal function. 2. Small nonobstructing left kidney stone. 3. 10 mm indeterminate lesion mid pole left kidney. When the patient is clinically stable and able to follow directions and hold their breath (preferably as an outpatient) further evaluation with  dedicated abdominal MRI should be considered. 4. Prominent extrahepatic common bile duct likely due to prior cholecystectomy. Correlate with LFTs. 5. Aortic atherosclerosis. Aortic Atherosclerosis (ICD10-I70.0). Electronically Signed   By: Donavan Foil M.D.   On: 01/04/2022 18:15     LOS: 2 days   Antonieta Pert, MD [facility]  01/06/2022, 10:57 AM

## 2022-01-06 NOTE — Evaluation (Signed)
Physical Therapy Evaluation Patient Details Name: Melissa Leon MRN: [id number] DOB: Sep 19, 1945 Today's Date: 01/06/2022  History of Present Illness  Pt admitted from home  with severe sepsis 2* right sided pylenephritis and hydronephrosis.  Pt now s/p R nephrostomy plaement 01/05/22 and with hx of DM  Clinical Impression  Pt admitted as above and presenting with functional mobility limitations 2* generalized weakness and mild ambulatory balance deficits. Pt should progress to dc home with assist of family.     Recommendations for follow up therapy are one component of a multi-disciplinary discharge planning process, led by the attending physician.  Recommendations may be updated based on patient status, additional functional criteria and insurance authorization.  Follow Up Recommendations No PT follow up      Assistance Recommended at Discharge Intermittent Supervision/Assistance  Patient can return home with the following  A little help with walking and/or transfers;A little help with bathing/dressing/bathroom;Assistance with cooking/housework;Assist for transportation;Help with stairs or ramp for entrance    Equipment Recommendations None recommended by PT  Recommendations for Other Services       Functional Status Assessment Patient has had a recent decline in their functional status and demonstrates the ability to make significant improvements in function in a reasonable and predictable amount of time.     Precautions / Restrictions Precautions Precautions: Fall;Other (comment) Precaution Comments: Nephrostomy drain on R Restrictions Weight Bearing Restrictions: No      Mobility  Bed Mobility Overal bed mobility: Needs Assistance Bed Mobility: Sit to Supine       Sit to supine: Min assist   General bed mobility comments: assist to bring LEs onto bed    Transfers Overall transfer level: Needs assistance Equipment used: Rolling walker (2 wheels) Transfers: Sit  to/from Stand Sit to Stand: Min guard           General transfer comment: Steady assist with cues for use of UEs to self assist    Ambulation/Gait Ambulation/Gait assistance: Min guard Gait Distance (Feet): 400 Feet Assistive device: Rolling walker (2 wheels) Gait Pattern/deviations: Step-through pattern, Decreased step length - right, Decreased step length - left, Shuffle, Trunk flexed       General Gait Details: cues for posture and position from [employer]            Wheelchair Mobility    Modified Rankin (Stroke Patients Only)       Balance Overall balance assessment: Needs assistance Sitting-balance support: No upper extremity supported, Feet supported Sitting balance-Leahy Scale: Good     Standing balance support: No upper extremity supported Standing balance-Leahy Scale: Fair                               Pertinent Vitals/Pain Pain Assessment Pain Assessment: Faces Faces Pain Scale: Hurts a little bit Pain Location: R flank Pain Descriptors / Indicators: Sore Pain Intervention(s): Limited activity within patient's tolerance, Monitored during session    Home Living Family/patient expects to be discharged to:: Private residence Living Arrangements: Alone Available Help at Discharge: Family;Available PRN/intermittently Type of Home: House Home Access: Stairs to enter   Entrance Stairs-Number of Steps: 1   Home Layout: One level Home Equipment: None      Prior Function Prior Level of Function : Independent/Modified Independent               ADLs Comments: Pt states 2 weeks ago she was going to the gym and doing yoga  Hand Dominance        Extremity/Trunk Assessment   Upper Extremity Assessment Upper Extremity Assessment: Defer to OT evaluation    Lower Extremity Assessment Lower Extremity Assessment: Generalized weakness    Cervical / Trunk Assessment Cervical / Trunk Assessment: Normal  Communication    Communication: No difficulties  Cognition Arousal/Alertness: Awake/alert Behavior During Therapy: WFL for tasks assessed/performed Overall Cognitive Status: Within Functional Limits for tasks assessed                                          General Comments      Exercises     Assessment/Plan    PT Assessment Patient needs continued PT services  PT Problem List Decreased strength;Decreased activity tolerance;Decreased balance;Decreased mobility;Decreased knowledge of use of DME;Pain       PT Treatment Interventions DME instruction;Gait training;Stair training;Functional mobility training;Therapeutic activities;Balance training;Therapeutic exercise;Patient/family education    PT Goals (Current goals can be found in the Care Plan section)  Acute Rehab PT Goals Patient Stated Goal: REgain IND PT Goal Formulation: With patient Time For Goal Achievement: 01/13/22 Potential to Achieve Goals: Good    Frequency Min 3X/week     Co-evaluation               AM-PAC PT "6 Clicks" Mobility  Outcome Measure Help needed turning from your back to your side while in a flat bed without using bedrails?: A Little Help needed moving from lying on your back to sitting on the side of a flat bed without using bedrails?: A Little Help needed moving to and from a bed to a chair (including a wheelchair)?: A Little Help needed standing up from a chair using your arms (e.g., wheelchair or bedside chair)?: A Little Help needed to walk in hospital room?: A Little Help needed climbing 3-5 steps with a railing? : A Lot 6 Click Score: 17    End of Session   Activity Tolerance: Patient tolerated treatment well Patient left: in bed;with call bell/phone within reach;with family/visitor present Nurse Communication: Mobility status PT Visit Diagnosis: Difficulty in walking, not elsewhere classified (R26.2);Muscle weakness (generalized) (M62.81)    Time: 6314-9702 PT Time  Calculation (min) (ACUTE ONLY): 24 min   Charges:   PT Evaluation $PT Eval Low Complexity: 1 Low          [place] Pager [phone] Office [phone]   Codi Folkerts 01/06/2022, 3:32 PM

## 2022-01-06 NOTE — Progress Notes (Signed)
Referring Physician(s): Abner Greenspan, M.   Supervising Physician: Mir, Sharen Heck  Patient Status:  [facility] - In-pt  Chief Complaint:  Sepsis, right-sided pyelonephritis, right hydronephrosis  S/p R PCN placement by Dr. Earleen Newport on 01/05/22.  Subjective:  Pt laying in bed NAD, son at bedside.  States that she started to feel little better today. Denies fever, flank pain, N/V.  Denies light headedness, SOB.   Allergies: Choline fenofibrate, Penicillins, and Ozempic (0.25 or 0.5 mg-dose) [semaglutide(0.25 or 0.'5mg'$ -dos)]  Medications: Prior to Admission medications   Medication Sig Start Date End Date Taking? Authorizing Provider  amLODipine (NORVASC) 5 MG tablet Take 5 mg by mouth at bedtime. 03/27/21  Yes [provider]  aspirin 81 MG chewable tablet Chew 81 mg by mouth at bedtime.   Yes [provider]  atorvastatin (LIPITOR) 10 MG tablet Take 10 mg by mouth at bedtime. 04/29/21  Yes [provider]  cephALEXin (KEFLEX) 500 MG capsule Take 1 capsule (500 mg total) by mouth 3 (three) times daily. 01/03/22  Yes Horton, Barbette Hair, MD  glimepiride (AMARYL) 4 MG tablet Take 4 mg by mouth 2 (two) times daily with a meal. 05/12/21  Yes [provider]  hydrochlorothiazide (HYDRODIURIL) 25 MG tablet Take 25 mg by mouth every morning. 04/21/21  Yes [provider]  losartan (COZAAR) 100 MG tablet Take 100 mg by mouth in the morning. 04/21/21  Yes [provider]  LUMIFY 0.025 % SOLN Place 1 drop into both eyes 3 (three) times daily as needed (for redness).   Yes [provider]  metFORMIN (GLUCOPHAGE-XR) 500 MG 24 hr tablet Take 1,000 mg by mouth 2 (two) times daily with a meal. 03/27/21  Yes [provider]  ondansetron (ZOFRAN-ODT) 4 MG disintegrating tablet Take 1 tablet (4 mg total) by mouth every 8 (eight) hours as needed for nausea or vomiting. 01/03/22  Yes Horton, Barbette Hair, MD  potassium chloride SA (KLOR-CON M) 20 MEQ tablet Take  2 tablets (40 mEq total) by mouth daily. 01/03/22  Yes Horton, Barbette Hair, MD     Vital Signs: BP 125/80 (BP Location: Left Arm)   Pulse 89   Temp (!) 97.5 F (36.4 C) (Oral)   Resp 16   Ht '5\' 7"'$  (1.702 m)   Wt 185 lb 3 oz (84 kg)   SpO2 99%   BMI 29.00 kg/m   Physical Exam Vitals reviewed.  Constitutional:      General: She is not in acute distress.    Appearance: She is not ill-appearing.  HENT:     Head: Normocephalic.  Pulmonary:     Effort: Pulmonary effort is normal.  Abdominal:     General: Abdomen is flat.     Palpations: Abdomen is soft.  Skin:    General: Skin is warm and dry.     Coloration: Skin is not jaundiced or pale.     Comments: Positive R PCN drain to gravity bag. Site is unremarkable with no erythema, edema, tenderness, bleeding or drainage. Suture and stat lock in place. Dressing is clean, dry, and intact. 150 ml of  blood tinged urine  in the bag, no clots.   Neurological:     Mental Status: She is alert and oriented to person, place, and time.  Psychiatric:        Mood and Affect: Mood normal.        Behavior: Behavior normal.     Imaging: IR NEPHROSTOMY PLACEMENT RIGHT  Result Date: 01/05/2022  INDICATION: 76 year old female referred for drainage of peripelvic renal cyst versus PCN for UPJ obstruction EXAM: IR NEPHROSTOMY PLACEMENT RIGHT COMPARISON:  CT 01/04/2022 MEDICATIONS: None ANESTHESIA/SEDATION: Fentanyl 100 mcg IV; Versed 2.0 mg IV Moderate Sedation Time:  14 minutes The patient was continuously monitored during the procedure by the interventional radiology nurse under my direct supervision. CONTRAST:  55m OMNIPAQUE IOHEXOL 300 MG/ML SOLN - administered into the collecting system(s) FLUOROSCOPY TIME:  Fluoroscopy Time: 0 minutes 36 seconds (7 mGy). COMPLICATIONS: None PROCEDURE: Informed written consent was obtained from the patient after a thorough discussion of the procedural risks, benefits and alternatives. All questions were addressed.  Maximal Sterile Barrier Technique was utilized including caps, mask, sterile gowns, sterile gloves, sterile drape, hand hygiene and skin antiseptic. A timeout was performed prior to the initiation of the procedure. Patient positioned prone position on the fluoroscopy table. Ultrasound survey of the right flank was performed with images stored and sent to PACs. The patient was then prepped and draped in the usual sterile fashion. 1% lidocaine was used to anesthetize the skin and subcutaneous tissues for local anesthesia. A Chiba needle was then used to access a posterior inferior calyx with ultrasound guidance. With spontaneous urine returned through the needle, passage of an 018 micro wire into the collecting system was performed under fluoroscopy. A small incision was made with an 11 blade scalpel, and the needle was removed from the wire. An Accustick system was then advanced over the wire into the collecting system under fluoroscopy. The metal stiffener and inner dilator were removed, and then a sample of fluid was aspirated through the 4 French outer sheath. Bentson wire was passed into the collecting system and the sheath removed. Ten French dilation of the soft tissues was performed. Using modified Seldinger technique, a 10 French pigtail catheter drain was placed over the Bentson wire. Wire and inner stiffener removed, and the pigtail was formed in the collecting system. Evacuation of a proximally 510 cc of frankly purulent urine was performed. Contrast injection confirmed position of the catheter. Patient tolerated the procedure well and remained hemodynamically stable throughout. No complications were encountered and no significant blood loss encountered IMPRESSION: Status post image guided placement of right-sided PCN. 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 Signed, JDulcy Fanny WNadene Rubins RPVI [facility] Electronically Signed   By: JCorrie MckusickD.O.   On: 01/05/2022 11:38   CT ABDOMEN PELVIS W CONTRAST  Result Date: 01/04/2022 CLINICAL DATA:  Sepsis weakness EXAM: CT ABDOMEN AND PELVIS WITH CONTRAST TECHNIQUE: Multidetector CT imaging of the abdomen and pelvis was performed using the standard protocol following bolus administration of intravenous contrast. RADIATION DOSE REDUCTION: This exam was performed according to the departmental dose-optimization program which includes automated exposure control, adjustment of the mA and/or kV according to patient size and/or use of iterative reconstruction technique. CONTRAST:  1041mOMNIPAQUE IOHEXOL 300 MG/ML  SOLN COMPARISON:  Radiograph 01/04/2019 FINDINGS: Lower chest: Lung bases demonstrate no acute airspace disease. Hepatobiliary: Nonvisualized gallbladder likely due to prior cholecystectomy. Prominent extrahepatic common bile duct measures up to 16 mm. No intra hepatic biliary dilatation. Pancreas: Unremarkable. No pancreatic ductal dilatation or surrounding inflammatory changes. Spleen: Normal in size without focal abnormality. Adrenals/Urinary Tract: Adrenal glands are normal. Small left kidney stone versus mild intrarenal vascular calcification.  Severe hydronephrosis of the right kidney without hydroureter. Mildly dense debris within the right renal collecting system posteriorly, series 2, image 50. negative for ureteral stone. Bladder is normal. Stranding at the right renal pelvis. Mild bilateral perinephric nonspecific stranding. 10 mm possible indeterminate lesion mid pole left kidney, series 2, image 37. No significant excretion of contrast from right kidney on delayed views. Stomach/Bowel: The stomach is nonenlarged. No dilated small bowel. No acute bowel wall thickening. Vascular/Lymphatic: Moderate severe aortic  atherosclerosis. No aneurysm. No suspicious lymph nodes. Mass effect on IVC by large right renal pelvis but with flow enhancement present. Reproductive: Uterus and bilateral adnexa are unremarkable. Other: Negative for pelvic effusion or free Musculoskeletal: Degenerative changes of the lumbar spine. No acute osseous abnormality IMPRESSION: 1. 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 Nonspecific perinephric fat stranding though moderate stranding adjacent to the right renal pelvis which could be due to infection or inflammation. No apparent contrast excretion from right kidney on delayed views consistent with decreased renal function. 2. Small nonobstructing left kidney stone. 3. 10 mm indeterminate lesion mid pole left kidney. When the patient is clinically stable and able to follow directions and hold their breath (preferably as an outpatient) further evaluation with dedicated abdominal MRI should be considered. 4. Prominent extrahepatic common bile duct likely due to prior cholecystectomy. Correlate with LFTs. 5. Aortic atherosclerosis. Aortic Atherosclerosis (ICD10-I70.0). Electronically Signed   By: Donavan Foil M.D.   On: 01/04/2022 18:15   DG Abdomen 1 View  Result Date: 01/03/2022 CLINICAL DATA:  Nausea EXAM: ABDOMEN - 1 VIEW COMPARISON:  None Available. FINDINGS: The bowel gas pattern is normal. No radio-opaque calculi or other significant radiographic abnormality are seen. IMPRESSION: Negative. Electronically Signed   By: Ulyses Jarred M.D.   On: 01/03/2022 02:00    Labs:  CBC: Recent Labs    01/02/22 1859 01/04/22 1257 01/05/22 0453 01/06/22 0622  WBC 15.2* 17.1* 22.9* 16.2*  HGB 12.5 11.8* 12.1 10.0*  HCT 36.0 34.4* 35.2* 31.9*  PLT 292 290 426* 290    COAGS: Recent Labs    01/05/22 0453  INR 1.2    BMP: Recent Labs    01/02/22 1859 01/04/22 1257 01/05/22 0453 01/06/22 0622  NA 132* 132*  132* 135  K 3.0* 3.7 3.4* 3.3*  CL 92* 98 98 106  CO2 '25 22 23 '$ 20*  GLUCOSE 164* 207* 146* 89  BUN 26* 26* 28* 34*  CALCIUM 8.8* 8.6* 8.8* 8.2*  CREATININE 1.46* 1.38* 1.73* 2.02*  GFRNONAA 37* 40* 30* 25*    LIVER FUNCTION TESTS: Recent Labs    01/04/22 1257 01/05/22 0453  BILITOT 2.0* 1.7*  AST 23 43*  ALT 22 29  ALKPHOS 83 101  PROT 6.7 7.3  ALBUMIN 2.9* 2.9*    Assessment and Plan:  76 y.o. 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 Earleen Newport on 01/05/22.   VSS OP from R PCN 250 mL, appears bloody tinged urine w/o clots  RF slightly worse than yesterday  Leukocytosis improving, hgb dropped from 12.1 to 10 today, patient asymptomatic.  asymptomatic   PLAN - no need for routine flush as long as had good output from R PCN and it appears regular urine (no clots/puss)  - document R PCN output q shift - Dressing change as needed, at least once in 2-3 days.  - If patient needs the PCN for long term,  she will need to be scheduled for routine PCN exchange q 8-12 weeks, urology to place the initial order.   Further treatment plan per urology/TR[facility] Appreciate and agree with the plan.  IR to follow.    Electronically Signed: Tera Mater, PA-C 01/06/2022, 1:18 PM   I spent a total of 15 Minutes at the the patient's bedside AND on the patient's hospital floor or unit, greater than 50% of which was counseling/coordinating care for R PCN f/u.   This chart was dictated using voice recognition software.  Despite best efforts to proofread,  errors can occur which can change the documentation meaning.

## 2022-01-06 NOTE — Telephone Encounter (Signed)
Post ED Visit - Positive Culture Follow-up  Culture report reviewed by antimicrobial stewardship pharmacist: [place] Team '[]'$  Elenor Quinones, Pharm.D. '[]'$  Heide Guile, Pharm.D., BCPS AQ-ID '[]'$  Parks Neptune, Pharm.D., BCPS '[]'$  Alycia Rossetti, Pharm.D., BCPS '[]'$  Williams, [place].D., BCPS, AAHIVP '[]'$  Legrand Como, Pharm.D., BCPS, AAHIVP '[]'$  Salome Arnt, PharmD, BCPS '[]'$  Johnnette Gourd, PharmD, BCPS '[]'$  Hughes Better, PharmD, BCPS '[]'$  Leeroy Cha, PharmD '[]'$  Laqueta Linden, PharmD, BCPS '[]'$  Albertina Parr, PharmD  [place] Team '[]'$  Leodis Sias, PharmD '[]'$  Lindell Spar, PharmD '[]'$  Royetta Asal, PharmD '[]'$  Graylin Shiver, Rph '[]'$  Rema Fendt) Glennon Mac, PharmD '[]'$  Arlyn Dunning, PharmD '[]'$  Netta Cedars, PharmD '[]'$  Dia Sitter, PharmD '[]'$  Leone Haven, PharmD '[]'$  Gretta Arab, PharmD '[]'$  Theodis Shove, PharmD '[]'$  Peggyann Juba, PharmD '[]'$  Reuel Boom, PharmD   Positive urine culture Treated with Cephalexin, organism sensitive to the same and no further patient follow-up is required at this time.  Harlon Flor [facility] 01/06/2022, 10:17 AM

## 2022-01-06 NOTE — Progress Notes (Signed)
  Transition of Care [facility]) Screening Note   Patient Details  Name: Dailin Arvil Persons Date of Birth: 30-Apr-1945   Transition of Care [facility]) CM/SW Contact:    Vassie Moselle, LCSW Phone Number: 01/06/2022, 9:26 AM    Transition of Care Department [facility]) has reviewed patient and no TOC needs have been identified at this time. We will continue to monitor patient advancement through interdisciplinary progression rounds. If new patient transition needs arise, please place a TOC consult.

## 2022-01-07 DIAGNOSIS — N12 Tubulo-interstitial nephritis, not specified as acute or chronic: Secondary | ICD-10-CM | POA: Diagnosis not present

## 2022-01-07 LAB — BASIC METABOLIC PANEL
Anion gap: 9 (ref 5–15)
BUN: 37 mg/dL — ABNORMAL HIGH (ref 8–23)
CO2: 19 mmol/L — ABNORMAL LOW (ref 22–32)
Calcium: 8 mg/dL — ABNORMAL LOW (ref 8.9–10.3)
Chloride: 109 mmol/L (ref 98–111)
Creatinine, Ser: 1.89 mg/dL — ABNORMAL HIGH (ref 0.44–1.00)
GFR, Estimated: 27 mL/min — ABNORMAL LOW (ref >60)
Glucose, Bld: 186 mg/dL — ABNORMAL HIGH (ref 70–99)
Potassium: 3.5 mmol/L (ref 3.5–5.1)
Sodium: 137 mmol/L (ref 135–145)

## 2022-01-07 LAB — CBC
HCT: 32.4 % — ABNORMAL LOW (ref 36.0–46.0)
Hemoglobin: 10.4 g/dL — ABNORMAL LOW (ref 12.0–15.0)
MCH: 31 pg (ref 26.0–34.0)
MCHC: 32.1 g/dL (ref 30.0–36.0)
MCV: 96.7 fL (ref 80.0–100.0)
Platelets: 340 10*3/uL (ref 150–400)
RBC: 3.35 MIL/uL — ABNORMAL LOW (ref 3.87–5.11)
RDW: 13.6 % (ref 11.5–15.5)
WBC: 16.5 10*3/uL — ABNORMAL HIGH (ref 4.0–10.5)
nRBC: 0 % (ref 0.0–0.2)

## 2022-01-07 LAB — GLUCOSE, CAPILLARY
Glucose-Capillary: 149 mg/dL — ABNORMAL HIGH (ref 70–99)
Glucose-Capillary: 126 mg/dL — ABNORMAL HIGH (ref 70–99)
Glucose-Capillary: 120 mg/dL — ABNORMAL HIGH (ref 70–99)
Glucose-Capillary: 126 mg/dL — ABNORMAL HIGH (ref 70–99)

## 2022-01-07 MED ORDER — CEFAZOLIN SODIUM-DEXTROSE 2-4 GM/100ML-% IV SOLN
2.0000 g | Freq: Two times a day (BID) | INTRAVENOUS | Status: DC
  Administered 2022-01-07 – 2022-01-08 (×2): 2 g via INTRAVENOUS
  Filled 2022-01-07 (×2): qty 100

## 2022-01-07 MED ORDER — ATORVASTATIN CALCIUM 10 MG PO TABS
10.0000 mg | ORAL_TABLET | Freq: Every day | ORAL | Status: DC
  Administered 2022-01-07 – 2022-01-08 (×2): 10 mg via ORAL
  Filled 2022-01-07 (×2): qty 1

## 2022-01-07 NOTE — Consult Note (Signed)
[place] Nurse Consult Note: Reason for Consult: "bedsore pain"  Wound type: no wound Pressure injury prevention techniques, continue turning and repositioning. Stressed to patient to mobilize in the bed, lying on her side Prophylactic sacral foam dressing implemented appropriately for this patient to protect area Moisture barrier cream PRN  Added chair pressure re-distribution pad for use when up in recliner and explained to patient and son to take home for use  Son questions changing of the silicone foam, I have provided him with education on use of foam dressings and that the manufacturer would advise every 7 day change but we change every 3 days and assess under the foam Q shift (bedside nurses).  If the dressing becomes soiled will be changed PRN.   Verbalized understanding.  Key points made to patient and son. Mobilize and turn while in bed, she is reluctant to turn because of the IV, explained that she can request assistance from staff to position IV tubing when turned on her side.   WOC requested unit secretary to order chair pressure redistribution pad Kellie Simmering # [phone]  Discussed POC with patient and bedside nurse.  Re consult if needed, will not follow at this time. Thanks  Isabele Lollar [employer], RN,CWOCN, CNS, [place] [phone])

## 2022-01-07 NOTE — Progress Notes (Addendum)
Referring Physician(s): Abner Greenspan, M.   Supervising Physician: Sandi Mariscal  Patient Status:  [facility] - In-pt  Chief Complaint:  Sepsis, right-sided pyelonephritis, right hydronephrosis  S/p R PCN placement by Dr. Earleen Newport on 01/05/22.  Subjective:  Pt sitting in a chair, son and daughter at bedside.  She wishes that she feels litter bit better than yesterday, she feels about the same. Denies abd pain, n/v.  Mild sore on right flank area.  No d/c plan yet due to fluctuating WBCc.   Allergies: Choline fenofibrate, Penicillins, and Ozempic (0.25 or 0.5 mg-dose) [semaglutide(0.25 or 0.'5mg'$ -dos)]  Medications: Prior to Admission medications   Medication Sig Start Date End Date Taking? Authorizing Provider  amLODipine (NORVASC) 5 MG tablet Take 5 mg by mouth at bedtime. 03/27/21  Yes [provider]  aspirin 81 MG chewable tablet Chew 81 mg by mouth at bedtime.   Yes [provider]  atorvastatin (LIPITOR) 10 MG tablet Take 10 mg by mouth at bedtime. 04/29/21  Yes [provider]  cephALEXin (KEFLEX) 500 MG capsule Take 1 capsule (500 mg total) by mouth 3 (three) times daily. 01/03/22  Yes Horton, Barbette Hair, MD  glimepiride (AMARYL) 4 MG tablet Take 4 mg by mouth 2 (two) times daily with a meal. 05/12/21  Yes [provider]  hydrochlorothiazide (HYDRODIURIL) 25 MG tablet Take 25 mg by mouth every morning. 04/21/21  Yes [provider]  losartan (COZAAR) 100 MG tablet Take 100 mg by mouth in the morning. 04/21/21  Yes [provider]  LUMIFY 0.025 % SOLN Place 1 drop into both eyes 3 (three) times daily as needed (for redness).   Yes [provider]  metFORMIN (GLUCOPHAGE-XR) 500 MG 24 hr tablet Take 1,000 mg by mouth 2 (two) times daily with a meal. 03/27/21  Yes [provider]  ondansetron (ZOFRAN-ODT) 4 MG disintegrating tablet Take 1 tablet (4 mg total) by mouth every 8 (eight) hours as needed for nausea or vomiting. 01/03/22   Yes Horton, Barbette Hair, MD  potassium chloride SA (KLOR-CON M) 20 MEQ tablet Take 2 tablets (40 mEq total) by mouth daily. 01/03/22  Yes Horton, Barbette Hair, MD     Vital Signs: BP (!) 145/86 (BP Location: Left Arm)   Pulse 92   Temp 97.8 F (36.6 C) (Oral)   Resp 18   Ht '5\' 7"'$  (1.702 m)   Wt 172 lb 2.9 oz (78.1 kg)   SpO2 100%   BMI 26.97 kg/m   Physical Exam Vitals reviewed.  Constitutional:      General: She is not in acute distress.    Appearance: She is not ill-appearing.  HENT:     Head: Normocephalic.  Pulmonary:     Effort: Pulmonary effort is normal.  Abdominal:     General: Abdomen is flat.     Palpations: Abdomen is soft.  Skin:    General: Skin is warm and dry.     Coloration: Skin is not jaundiced or pale.     Comments: Positive R PCN drain to gravity bag. 10 ml of  slightly blood tinged urine in the bag, no clots, urine has less blood tinge today.    Neurological:     Mental Status: She is alert and oriented to person, place, and time.  Psychiatric:        Mood and Affect: Mood normal.        Behavior: Behavior normal.     Imaging: IR NEPHROSTOMY PLACEMENT RIGHT  Result  Date: 01/05/2022 INDICATION: 76 year old female referred for drainage of peripelvic renal cyst versus PCN for UPJ obstruction EXAM: IR NEPHROSTOMY PLACEMENT RIGHT COMPARISON:  CT 01/04/2022 MEDICATIONS: None ANESTHESIA/SEDATION: Fentanyl 100 mcg IV; Versed 2.0 mg IV Moderate Sedation Time:  14 minutes The patient was continuously monitored during the procedure by the interventional radiology nurse under my direct supervision. CONTRAST:  83m OMNIPAQUE IOHEXOL 300 MG/ML SOLN - administered into the collecting system(s) FLUOROSCOPY TIME:  Fluoroscopy Time: 0 minutes 36 seconds (7 mGy). COMPLICATIONS: None PROCEDURE: Informed written consent was obtained from the patient after a thorough discussion of the procedural risks, benefits and alternatives. All questions were addressed. Maximal Sterile  Barrier Technique was utilized including caps, mask, sterile gowns, sterile gloves, sterile drape, hand hygiene and skin antiseptic. A timeout was performed prior to the initiation of the procedure. Patient positioned prone position on the fluoroscopy table. Ultrasound survey of the right flank was performed with images stored and sent to PACs. The patient was then prepped and draped in the usual sterile fashion. 1% lidocaine was used to anesthetize the skin and subcutaneous tissues for local anesthesia. A Chiba needle was then used to access a posterior inferior calyx with ultrasound guidance. With spontaneous urine returned through the needle, passage of an 018 micro wire into the collecting system was performed under fluoroscopy. A small incision was made with an 11 blade scalpel, and the needle was removed from the wire. An Accustick system was then advanced over the wire into the collecting system under fluoroscopy. The metal stiffener and inner dilator were removed, and then a sample of fluid was aspirated through the 4 French outer sheath. Bentson wire was passed into the collecting system and the sheath removed. Ten French dilation of the soft tissues was performed. Using modified Seldinger technique, a 10 French pigtail catheter drain was placed over the Bentson wire. Wire and inner stiffener removed, and the pigtail was formed in the collecting system. Evacuation of a proximally 510 cc of frankly purulent urine was performed. Contrast injection confirmed position of the catheter. Patient tolerated the procedure well and remained hemodynamically stable throughout. No complications were encountered and no significant blood loss encountered IMPRESSION: Status post image guided placement of right-sided PCN. 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 Signed, JDulcy Fanny WNadene Rubins RPVI [facility]hes[facility]iology Electronically Signed   By: JCorrie MckusickD.O.   On: 01/05/2022 11:38   CT ABDOMEN PELVIS W CONTRAST  Result Date: 01/04/2022 CLINICAL DATA:  Sepsis weakness EXAM: CT ABDOMEN AND PELVIS WITH CONTRAST TECHNIQUE: Multidetector CT imaging of the abdomen and pelvis was performed using the standard protocol following bolus administration of intravenous contrast. RADIATION DOSE REDUCTION: This exam was performed according to the departmental dose-optimization program which includes automated exposure control, adjustment of the mA and/or kV according to patient size and/or use of iterative reconstruction technique. CONTRAST:  1046mOMNIPAQUE IOHEXOL 300 MG/ML  SOLN COMPARISON:  Radiograph 01/04/2019 FINDINGS: Lower chest: Lung bases demonstrate no acute airspace disease. Hepatobiliary: Nonvisualized gallbladder likely due to prior cholecystectomy. Prominent extrahepatic common bile duct measures up to 16 mm. No intra hepatic biliary dilatation. Pancreas: Unremarkable. No pancreatic ductal dilatation or surrounding inflammatory changes. Spleen: Normal in size without focal abnormality. Adrenals/Urinary Tract: Adrenal glands are normal. Small left kidney stone versus mild intrarenal  vascular calcification. Severe hydronephrosis of the right kidney without hydroureter. Mildly dense debris within the right renal collecting system posteriorly, series 2, image 50. negative for ureteral stone. Bladder is normal. Stranding at the right renal pelvis. Mild bilateral perinephric nonspecific stranding. 10 mm possible indeterminate lesion mid pole left kidney, series 2, image 37. No significant excretion of contrast from right kidney on delayed views. Stomach/Bowel: The stomach is nonenlarged. No dilated small bowel. No acute bowel wall thickening. Vascular/Lymphatic: Moderate severe aortic atherosclerosis.  No aneurysm. No suspicious lymph nodes. Mass effect on IVC by large right renal pelvis but with flow enhancement present. Reproductive: Uterus and bilateral adnexa are unremarkable. Other: Negative for pelvic effusion or free Musculoskeletal: Degenerative changes of the lumbar spine. No acute osseous abnormality IMPRESSION: 1. 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 Nonspecific perinephric fat stranding though moderate stranding adjacent to the right renal pelvis which could be due to infection or inflammation. No apparent contrast excretion from right kidney on delayed views consistent with decreased renal function. 2. Small nonobstructing left kidney stone. 3. 10 mm indeterminate lesion mid pole left kidney. When the patient is clinically stable and able to follow directions and hold their breath (preferably as an outpatient) further evaluation with dedicated abdominal MRI should be considered. 4. Prominent extrahepatic common bile duct likely due to prior cholecystectomy. Correlate with LFTs. 5. Aortic atherosclerosis. Aortic Atherosclerosis (ICD10-I70.0). Electronically Signed   By: Donavan Foil M.D.   On: 01/04/2022 18:15    Labs:  CBC: Recent Labs    01/04/22 1257 01/05/22 0453 01/06/22 0622 01/07/22 0628  WBC 17.1* 22.9* 16.2* 16.5*  HGB 11.8* 12.1 10.0* 10.4*  HCT 34.4* 35.2* 31.9* 32.4*  PLT 290 426* 290 340     COAGS: Recent Labs    01/05/22 0453  INR 1.2     BMP: Recent Labs    01/04/22 1257 01/05/22 0453 01/06/22 0622 01/07/22 0628  NA 132* 132* 135 137  K 3.7 3.4* 3.3* 3.5  CL 98 98 106 109  CO2 22 23 20* 19*  GLUCOSE 207* 146* 89 186*  BUN 26* 28* 34* 37*  CALCIUM 8.6* 8.8* 8.2* 8.0*  CREATININE 1.38* 1.73* 2.02* 1.89*  GFRNONAA 40* 30* 25* 27*     LIVER FUNCTION TESTS: Recent Labs    01/04/22 1257 01/05/22 0453  BILITOT 2.0* 1.7*  AST 23 43*  ALT 22 29  ALKPHOS 83 101   PROT 6.7 7.3  ALBUMIN 2.9* 2.9*     Assessment and Plan:  76 y.o. 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 Earleen Newport on 01/05/22.   VSS OP from R PCN 250 mL, appears bloody tinged urine w/o clots  RF still impaired - BUN 28>>34>>37 today  - creatinine 1.73>>2.02>>1.89 - GFR 30>>25>>27  WBCc 22.9>>16.2>>16.5 Hgb stable  Urine from  R PCN appears clearing, has less blood tinge today.  OP 350 mL  PLAN - no need for routine flush as long as had good output from R PCN and it appears regular urine (no clots/puss)  - document R PCN output q shift - Dressing change as needed, at least once in 2-3 days.  - If patient needs the PCN for long term, she will need to be scheduled for routine PCN exchange q 8-12 weeks, urology to place the initial order.  - urology planning lasix scan as outpatient   Further treatment plan per urology/TRH  Appreciate and agree with the plan.  Please call IR for questions and concerns.    Electronically Signed: Tera Mater, PA-C 01/07/2022, 8:46 AM   I spent a total of 15 Minutes at the the patient's bedside AND on the patient's hospital floor or unit, greater than 50% of which was counseling/coordinating care for R PCN f/u.   This chart was dictated using voice recognition software.  Despite best efforts to proofread,  errors can occur which can change the documentation meaning.

## 2022-01-07 NOTE — Progress Notes (Signed)
PROGRESS NOTE Melissa Leon  EPP:[id number] DOB: [birth year] DOA: 01/04/2022 PCP: Katherina Mires, MD   Brief Narrative/Hospital Course: 76 year old female with T2DM hypertension presents to the ED for progressively worsening right flank pain feeling unwell for few weeks with low energy, decreased appetite, intermittent nausea.  She has had subjective fever.  She was recently started on Ozempic and having some intolerance.  In the ED She was seen in the [place] ED on 12/15 found to have UTI discharged on Keflex despite which she continued to have worsening symptoms.  Urine culture showed E. coli from that visit. Seen in the ED again with low-grade fever, vitals fairly stable Labs showed worsening WBC count 721 from 15.2 on 12/15, creatinine about the same 1.3 , Lactic acidosis 2.3 UA with WBC more than 50 positive LE negative any urine blood culture sent, imaging obtained with x-ray abdomen CT abdomen with contrast> severe hydronephrosis of right kidney possible UPJ obstruction, perinephric fat stranding small left kidney stone lesion on the left kidney midpole MRI advised. 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  Urine cx w/ pansensitive E. coli (01/02/22).overall patient's kidney improving, AKI downtrending, leukocytosis improving/stable, hemodynamically stable. culture from IR sample  from nephrostomy with gram-negative rods, ID pending. Blood culture no growth so far.continue plan as per urology.  Continue current IV antibiotics with ceftriaxone, continue IV fluids.  Once stabilized urology planning for outpatient Lasix renal scan. Recent Labs  Lab 01/02/22 1859 01/04/22 1257 01/04/22 1714 01/04/22 1956 01/05/22 0453 01/06/22 0622 01/07/22 0628  WBC 15.2* 17.1*  --   --  22.9* 16.2* 16.5*  LATICACIDVEN  --   --  2.3* 1.5  --   --   --     Acute kidney injury on CKD stage IIIb baseline creatinine around 1.3 in December. AKI multifactorial due to  obstructive nephropathy,sepsis ,CIN?.  Creatinine now downtrending 1.8 encourage p.o., cut down IVF, monitor urine output BMP closely and avoid nephrotoxic medication/NSAIDs/contrast. Intake/Output Summary (Last 24 hours) at 01/07/2022 1016 Last data filed at 01/07/2022 6063 Gross per 24 hour  Intake 240 ml  Output 400 ml  Net -160 ml    Nausea: Resolved  Hypokalemia resolved Hypomagnesemia: Improved Acute hyponatremia: resolved Hyperlipidemia resume home statin  DM2 with hyperglycemia : With hypoglycemia yesterday morning blood sugar stable now encourage p.o., only on SSI for now hold home Ozempic. Recent Labs  Lab 01/04/22 1257 01/04/22 1300 01/06/22 0756 01/06/22 0911 01/06/22 1155 01/06/22 1653 01/07/22 0731  GLUCAP  --    < > 64* 81 136* 129* 149*  HGBA1C 5.6  --   --   --   --   --   --    < > = values in this interval not displayed.    Essential hypertension: BP slowly uptrending> HCTZ losartan and amlodipine remains on hold.   Complains of soreness on the bottom for few days, continue Mepilex dressing, wound care consulted advised frequent position changing, discussed with nursing  DVT prophylaxis: SCDs Start: 01/04/22 1928 Code Status:   Code Status: Full Code Family Communication: plan of care discussed with patient at bedside.  Patient status is: inpatient  because of Sepsis Level of care:  Telemetry  Dispo: The patient is from: home alone , daughter lives close by             Anticipated disposition: >1-2 days Objective: Vitals last 24 hrs: Vitals:   01/06/22 0238 01/06/22 1154 01/06/22 2043 01/07/22 0511  BP: 113/69 125/80 (!) 148/75 (!) 145/86  Pulse: 84 89 97 92  Resp: '20 16 18 18  '$ Temp: (!) 97.5 F (36.4 C) (!) 97.5 F (36.4 C) 97.7 F (36.5 C) 97.8 F (36.6 C)  TempSrc:  Oral Oral Oral  SpO2: 97% 99% 100% 100%  Weight:    78.1 kg  Height:       Weight change:   Physical Examination: General exam: AA, weak,older appearing HEENT:Oral mucosa moist, Ear/Nose WNL grossly, dentition normal. Respiratory system: bilaterally clear BS, no use of accessory muscle Cardiovascular system: S1 & S2 +, regular rate. Gastrointestinal system: Abdomen soft, right nephrostomy tube in place w/ clear urine, ND,BS+ Nervous System:Alert, awake, moving extremities and grossly nonfocal Extremities: LE ankle edema neg, upper extremity edematous, lower extremities warm Skin: No rashes,no icterus. MSK: Normal muscle bulk,tone, power   Medications reviewed:  Scheduled Meds:  atorvastatin  10 mg Oral QHS   insulin aspart  0-6 Units Subcutaneous TID WC   Continuous Infusions:  cefTRIAXone (ROCEPHIN)  IV 2 g (01/06/22 1714)   dextrose 5 % and 0.9% NaCl 125 mL/hr at 01/06/22 1007    Diet Order             Diet Carb Modified Fluid consistency: Thin; Room service appropriate? Yes  Diet effective now                  Intake/Output Summary (Last 24 hours) at 01/07/2022 1016 Last data filed at 01/07/2022 0923 Gross per 24 hour  Intake 240 ml  Output 400 ml  Net -160 ml   Net IO Since Admission: -490 mL [01/07/22 1016]  Wt Readings from Last 3 Encounters:  01/07/22 78.1 kg  06/03/21 83.9 kg  05/30/21 83.9 kg     Unresulted Labs (From admission, onward)     Start     Ordered   01/06/22 2951  Basic metabolic panel  Daily,   R      01/05/22 0747   01/06/22 0500  CBC   Daily,   R      01/05/22 0747          Data Reviewed: I have personally reviewed following labs and imaging studies CBC: Recent Labs  Lab 01/02/22 1859 01/04/22 1257 01/05/22 0453 01/06/22 0622 01/07/22 0628  WBC 15.2* 17.1* 22.9* 16.2* 16.5*  NEUTROABS 13.0* 15.4* 20.2*  --   --   HGB 12.5 11.8* 12.1 10.0* 10.4*  HCT 36.0 34.4* 35.2* 31.9* 32.4*  MCV 89.8 91.5 91.0 96.4 96.7  PLT 292 290 426* 290 884  Basic Metabolic Panel: Recent Labs  Lab 01/02/22 1859 01/04/22 1257 01/04/22 1956 01/05/22 0453 01/06/22 0622 01/07/22 0628  NA 132* 132*  --  132* 135 137  K 3.0* 3.7  --  3.4* 3.3* 3.5  CL 92* 98  --  98 106 109  CO2 25 22  --  23 20* 19*  GLUCOSE 164* 207*  --  146* 89 186*  BUN 26* 26*  --  28* 34* 37*  CREATININE 1.46* 1.38*  --  1.73* 2.02* 1.89*  CALCIUM 8.8* 8.6*  --  8.8* 8.2* 8.0*  MG  --   --  1.6* 2.2  --   --   GFR: Estimated Creatinine Clearance: 27.3 mL/min (A) (by C-G formula based on SCr of 1.89 mg/dL (H)). Liver Function Tests: Recent Labs  Lab 01/04/22 1257 01/05/22 0453  AST 23 43*  ALT 22 29  ALKPHOS 83 101  BILITOT 2.0* 1.7*  PROT 6.7 7.3  ALBUMIN 2.9* 2.9*   Recent Labs  Lab 01/05/22 0453  LIPASE 44  CBG: Recent Labs  Lab 01/06/22 0756 01/06/22 0911 01/06/22 1155 01/06/22 1653 01/07/22 0731  GLUCAP 64* 81 136* 129* 149*  Sepsis Labs: Recent Labs  Lab 01/04/22 1714 01/04/22 1956  LATICACIDVEN 2.3* 1.5   Recent Results (from the past 240 hour(s))  Resp panel by RT-PCR (RSV, Flu A&B, Covid) Anterior Nasal Swab     Status: None   Collection Time: 01/02/22  6:59 PM   Specimen: Anterior Nasal Swab  Result Value Ref Range Status   SARS Coronavirus 2 by RT PCR NEGATIVE NEGATIVE Final    Comment: (NOTE) SARS-CoV-2 target nucleic acids are NOT DETECTED.  The SARS-CoV-2 RNA is generally detectable in upper respiratory specimens during the acute phase of infection. The lowest concentration of SARS-CoV-2 viral copies this  assay can detect is 138 copies/mL. A negative result does not preclude SARS-Cov-2 infection and should not be used as the sole basis for treatment or other patient management decisions. A negative result may occur with  improper specimen collection/handling, submission of specimen other than nasopharyngeal swab, presence of viral mutation(s) within the areas targeted by this assay, and inadequate number of viral copies(<138 copies/mL). A negative result must be combined with clinical observations, patient history, and epidemiological information. The expected result is Negative.  Fact Sheet for Patients:  EntrepreneurPulse.com.au  Fact Sheet for Healthcare Providers:  IncredibleEmployment.be  This test is no t yet approved or cleared by the [place] FDA and  has been authorized for detection and/or diagnosis of SARS-CoV-2 by FDA under an Emergency Use Authorization (EUA). This EUA will remain  in effect (meaning this test can be used) for the duration of the COVID-19 declaration under Section 564(b)(1) of the Act, 21 U.S.C.section 360bbb-3(b)(1), unless the authorization is terminated  or revoked sooner.       Influenza A by PCR NEGATIVE NEGATIVE Final   Influenza B by PCR NEGATIVE NEGATIVE Final    Comment: (NOTE) The Xpert Xpress SARS-CoV-2/FLU/RSV plus assay is intended as an aid in the diagnosis of influenza from Nasopharyngeal swab specimens and should not be used as a sole basis for treatment. Nasal washings and aspirates are unacceptable for Xpert Xpress SARS-CoV-2/FLU/RSV testing.  Fact Sheet for Patients: EntrepreneurPulse.com.au  Fact Sheet for Healthcare Providers: IncredibleEmployment.be  This test is not yet approved or cleared by the [place] FDA and has been authorized for detection and/or diagnosis of SARS-CoV-2 by FDA under an Emergency Use Authorization (EUA). This EUA will  remain in effect (meaning this test can be used) for the duration of the COVID-19 declaration under Section 564(b)(1) of the Act, 21 U.S.C. section 360bbb-3(b)(1), unless the authorization is terminated or revoked.     Resp Syncytial Virus by PCR NEGATIVE NEGATIVE Final    Comment: (NOTE) Fact Sheet for Patients: EntrepreneurPulse.com.au  Fact Sheet for Healthcare Providers: IncredibleEmployment.be  This test is not yet approved or cleared by the [place]  States FDA and has been authorized for detection and/or diagnosis of SARS-CoV-2 by FDA under an Emergency Use Authorization (EUA). This EUA will remain in effect (meaning this test can be used) for the duration of the COVID-19 declaration under Section 564(b)(1) of the Act, 21 U.S.C. section 360bbb-3(b)(1), unless the authorization is terminated or revoked.  Performed at [facility], [address], [place], [place] [address]   Urine Culture     Status: Abnormal   Collection Time: 01/02/22  6:59 PM   Specimen: Urine, Clean Catch  Result Value Ref Range Status   Specimen Description   Final    URINE, CLEAN CATCH Performed at [facility], [address], [place], [place] [address]    Special Requests   Final    NONE Performed at [facility], [address], [place], [place] [address]    Culture >=100,000 COLONIES/mL ESCHERICHIA COLI (A)  Final   Report Status 01/05/2022 FINAL  Final   Organism ID, Bacteria ESCHERICHIA COLI (A)  Final      Susceptibility   Escherichia coli - MIC*    AMPICILLIN <=2 SENSITIVE Sensitive     CEFAZOLIN <=4 SENSITIVE Sensitive     CEFEPIME <=0.12 SENSITIVE Sensitive     CEFTRIAXONE <=0.25 SENSITIVE Sensitive     CIPROFLOXACIN <=0.25 SENSITIVE Sensitive     GENTAMICIN <=1 SENSITIVE Sensitive     IMIPENEM <=0.25 SENSITIVE Sensitive     NITROFURANTOIN <=16 SENSITIVE Sensitive     TRIMETH/SULFA <=20  SENSITIVE Sensitive     AMPICILLIN/SULBACTAM <=2 SENSITIVE Sensitive     PIP/TAZO <=4 SENSITIVE Sensitive     * >=100,000 COLONIES/mL ESCHERICHIA COLI  Urine Culture     Status: Abnormal   Collection Time: 01/04/22  4:37 PM   Specimen: Urine, Clean Catch  Result Value Ref Range Status   Specimen Description   Final    URINE, CLEAN CATCH Performed at [facility], [place] [address], [place], [place] [address]    Special Requests   Final    NONE Performed at [facility], [place] [address], [place], [place] [address]    Culture (A)  Final    <10,000 COLONIES/mL INSIGNIFICANT GROWTH Performed at [facility] [address], [place], [place] [address]    Report Status 01/05/2022 FINAL  Final  Blood culture (routine x 2)     Status: None (Preliminary result)   Collection Time: 01/04/22  5:14 PM   Specimen: BLOOD  Result Value Ref Range Status   Specimen Description   Final    BLOOD LEFT ANTECUBITAL Performed at [facility] [address] [address] [place], [place] [address]    Special Requests   Final    BOTTLES DRAWN AEROBIC AND ANAEROBIC Blood Culture adequate volume Performed at [facility] [address], [place], [place] [address]    Culture   Final    NO GROWTH 3 DAYS Performed at [facility], [place] [address] [place], [place] [address]    Report Status PENDING  Incomplete  Blood culture (routine x 2)     Status: None (Preliminary result)   Collection Time: 01/04/22  5:14 PM   Specimen: BLOOD  Result Value Ref Range Status   Specimen Description   Final    BLOOD RIGHT ANTECUBITAL Performed at [facility] [address], [place], [place] [address]    Special Requests   Final    BOTTLES DRAWN AEROBIC  ONLY Blood Culture results may not be optimal due to an inadequate volume of blood received in culture bottles Performed at [facility], [place] [address] [place], [place] [address]    Culture   Final    NO GROWTH 3 DAYS Performed at [facility], [place] [address], [place], [place] [address]    Report Status PENDING  Incomplete  Resp panel by RT-PCR (RSV, Flu A&B, Covid) Anterior Nasal Swab     Status: None   Collection Time: 01/04/22  5:14 PM   Specimen: Anterior Nasal Swab  Result Value Ref Range Status   SARS Coronavirus 2 by RT PCR NEGATIVE NEGATIVE Final    Comment: (NOTE) SARS-CoV-2 target nucleic acids are NOT DETECTED.  The SARS-CoV-2 RNA is generally detectable in upper respiratory specimens during the acute phase of infection. The lowest concentration of SARS-CoV-2 viral copies this assay can detect is 138 copies/mL. A negative result does not preclude SARS-Cov-2 infection and should not be used as the sole basis for treatment or other patient management decisions. A negative result may occur with  improper specimen collection/handling, submission of specimen other than nasopharyngeal swab, presence of viral mutation(s) within the areas targeted by this assay, and inadequate number of viral copies(<138 copies/mL). A negative result must be combined with clinical observations, patient history, and epidemiological information. The expected result is Negative.  Fact Sheet for Patients:  EntrepreneurPulse.com.au  Fact Sheet for Healthcare Providers:  IncredibleEmployment.be  This test is no t yet approved or cleared by the [place] FDA and  has been authorized for detection and/or diagnosis of SARS-CoV-2 by FDA under an Emergency Use Authorization (EUA). This EUA will remain  in effect (meaning this test can be used) for the duration of the COVID-19 declaration under Section 564(b)(1) of the Act, 21 U.S.C.section 360bbb-3(b)(1), unless the authorization is terminated  or revoked sooner.       Influenza A by PCR NEGATIVE NEGATIVE Final   Influenza B by PCR NEGATIVE NEGATIVE  Final    Comment: (NOTE) The Xpert Xpress SARS-CoV-2/FLU/RSV plus assay is intended as an aid in the diagnosis of influenza from Nasopharyngeal swab specimens and should not be used as a sole basis for treatment. Nasal washings and aspirates are unacceptable for Xpert Xpress SARS-CoV-2/FLU/RSV testing.  Fact Sheet for Patients: EntrepreneurPulse.com.au  Fact Sheet for Healthcare Providers: IncredibleEmployment.be  This test is not yet approved or cleared by the [place] FDA and has been authorized for detection and/or diagnosis of SARS-CoV-2 by FDA under an Emergency Use Authorization (EUA). This EUA will remain in effect (meaning this test can be used) for the duration of the COVID-19 declaration under Section 564(b)(1) of the Act, 21 U.S.C. section 360bbb-3(b)(1), unless the authorization is terminated or revoked.     Resp Syncytial Virus by PCR NEGATIVE NEGATIVE Final    Comment: (NOTE) Fact Sheet for Patients: EntrepreneurPulse.com.au  Fact Sheet for Healthcare Providers: IncredibleEmployment.be  This test is not yet approved or cleared by the [place] FDA and has been authorized for detection and/or diagnosis of SARS-CoV-2 by FDA under an Emergency Use Authorization (EUA). This EUA will remain in effect (meaning this test can be used) for the duration of the COVID-19 declaration under Section 564(b)(1) of the Act, 21 U.S.C. section 360bbb-3(b)(1), unless the authorization is terminated or revoked.  Performed at [facility], [place] [address] [place], [place] [address]   Aerobic/Anaerobic Culture w Gram Stain (surgical/deep wound)  Status: None (Preliminary result)   Collection Time: 01/05/22 11:26 AM   Specimen: Kidney; Urine  Result Value Ref Range Status   Specimen Description   Final    KIDNEY Performed at [facility] [address]  [place], [place] [address]    Special Requests   Final    NONE Performed at [facility], [place] [address], [place], [place] [address]    Gram Stain   Final    MODERATE WBC PRESENT, PREDOMINANTLY PMN RARE GRAM NEGATIVE RODS    Culture   Final    ABUNDANT GRAM NEGATIVE RODS IDENTIFICATION AND SUSCEPTIBILITIES TO FOLLOW Performed at [facility], [place] [address] [place], [place] [address]    Report Status PENDING  Incomplete    Antimicrobials: Anti-infectives (From admission, onward)    Start     Dose/Rate Route Frequency Ordered Stop   01/05/22 1700  cefTRIAXone (ROCEPHIN) 2 g in sodium chloride 0.9 % 100 mL IVPB        2 g 200 mL/hr over 30 Minutes Intravenous Every 24 hours 01/05/22 0752     01/05/22 1600  cefTRIAXone (ROCEPHIN) 1 g in sodium chloride 0.9 % 100 mL IVPB  Status:  Discontinued        1 g 200 mL/hr over 30 Minutes Intravenous Every 24 hours 01/04/22 1929 01/05/22 0747   01/05/22 1130  levofloxacin (LEVAQUIN) IVPB 500 mg        500 mg 100 mL/hr over 60 Minutes Intravenous  Once 01/05/22 1049 01/05/22 1431   01/05/22 0747  cefTRIAXone (ROCEPHIN) 2 g in sodium chloride 0.9 % 100 mL IVPB  Status:  Discontinued        2 g 200 mL/hr over 30 Minutes Intravenous Every 24 hours 01/05/22 0747 01/05/22 0752   01/04/22 1645  cefTRIAXone (ROCEPHIN) 1 g in sodium chloride 0.9 % 100 mL IVPB        1 g 200 mL/hr over 30 Minutes Intravenous  Once 01/04/22 1637 01/04/22 1817      Culture/Microbiology    Component Value Date/Time   SDES  01/05/2022 1126    KIDNEY Performed at [facility], [place] [address] [place], [place] [address]    SPECREQUEST  01/05/2022 1126    NONE Performed at [facility], [place] [address] [place], [place] [address]    CULT  01/05/2022 1126    ABUNDANT GRAM NEGATIVE RODS IDENTIFICATION AND SUSCEPTIBILITIES TO FOLLOW Performed at [facility], [place] [address], [place], [place]  [address]    REPTSTATUS PENDING 01/05/2022 1126  Other culture-see note  Radiology Studies: IR NEPHROSTOMY PLACEMENT RIGHT  Result Date: 01/05/2022 INDICATION: 76 year old female referred for drainage of peripelvic renal cyst versus PCN for UPJ obstruction EXAM: IR NEPHROSTOMY PLACEMENT RIGHT COMPARISON:  CT 01/04/2022 MEDICATIONS: None ANESTHESIA/SEDATION: Fentanyl 100 mcg IV; Versed 2.0 mg IV Moderate Sedation Time:  14 minutes The patient was continuously monitored during the procedure by the interventional radiology nurse under my direct supervision. CONTRAST:  73m OMNIPAQUE IOHEXOL 300 MG/ML SOLN - administered into the collecting system(s) FLUOROSCOPY TIME:  Fluoroscopy Time: 0 minutes 36 seconds (7 mGy). COMPLICATIONS: None PROCEDURE: Informed written consent was obtained from the patient after a thorough discussion of the procedural risks, benefits and alternatives. All questions were addressed. Maximal Sterile Barrier Technique was utilized including caps, mask, sterile gowns, sterile gloves, sterile drape, hand hygiene and skin antiseptic. A timeout was performed prior to the initiation of the  procedure. Patient positioned prone position on the fluoroscopy table. Ultrasound survey of the right flank was performed with images stored and sent to PACs. The patient was then prepped and draped in the usual sterile fashion. 1% lidocaine was used to anesthetize the skin and subcutaneous tissues for local anesthesia. A Chiba needle was then used to access a posterior inferior calyx with ultrasound guidance. With spontaneous urine returned through the needle, passage of an 018 micro wire into the collecting system was performed under fluoroscopy. A small incision was made with an 11 blade scalpel, and the needle was removed from the wire. An Accustick system was then advanced over the wire into the collecting system under fluoroscopy. The metal stiffener and inner dilator were removed, and then a sample of  fluid was aspirated through the 4 French outer sheath. Bentson wire was passed into the collecting system and the sheath removed. Ten French dilation of the soft tissues was performed. Using modified Seldinger technique, a 10 French pigtail catheter drain was placed over the Bentson wire. Wire and inner stiffener removed, and the pigtail was formed in the collecting system. Evacuation of a proximally 510 cc of frankly purulent urine was performed. Contrast injection confirmed position of the catheter. Patient tolerated the procedure well and remained hemodynamically stable throughout. No complications were encountered and no significant blood loss encountered IMPRESSION: Status post image guided placement of right-sided PCN. 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 Signed, Dulcy Fanny. Nadene Rubins, RPVI [facility] Radiology Electronically Signed   By: Corrie Mckusick D.O.   On: 01/05/2022 11:38     LOS: 3 days   Antonieta Pert, MD [facility]  01/07/2022, 10:16 AM

## 2022-01-07 NOTE — Evaluation (Signed)
Occupational Therapy Evaluation Patient Details Name: Melissa Leon MRN: [id number] DOB: 22-May-1945 Today's Date: 01/07/2022   History of Present Illness Patient is a 76 year old female who presented with nausea, decreased energy and appetite. Patient was admitted with severe sepsis secondary to UTI, sever hydronephrosis of R kidney,right UPJ obstriction v.s parapelvic cyst.  PMH: HTN, DM II   Clinical Impression   Patient is a 76 year old female who was admitted for above. Patient was living at home alone prior level. Patient was min guard/min A for ADLs on this date. Patient was noted to have decreased functional activity tolerance, decreased standign balance and increased need for training with AE/AD impacting participation in ADLs. Anticipate that patient will be able to progress while here to not need OT follow up in next level of care. Patient has family support at home as needed. Patient would continue to benefit from skilled OT services at this time while admitted and after d/c to address noted deficits in order to improve overall safety and independence in ADLs.        Recommendations for follow up therapy are one component of a multi-disciplinary discharge planning process, led by the attending physician.  Recommendations may be updated based on patient status, additional functional criteria and insurance authorization.   Follow Up Recommendations  No OT follow up     Assistance Recommended at Discharge PRN  Patient can return home with the following Assistance with cooking/housework;Direct supervision/assist for medications management;Assist for transportation;Help with stairs or ramp for entrance;Direct supervision/assist for financial management    Functional Status Assessment  Patient has had a recent decline in their functional status and demonstrates the ability to make significant improvements in function in a reasonable and predictable amount of time.  Equipment  Recommendations  None recommended by OT    Recommendations for Other Services       Precautions / Restrictions Precautions Precautions: Fall;Other (comment) Precaution Comments: Nephrostomy drain on R Restrictions Weight Bearing Restrictions: No      Mobility Bed Mobility Overal bed mobility: Needs Assistance Bed Mobility: Supine to Sit     Supine to sit: Min guard          Transfers                          Balance Overall balance assessment: Needs assistance Sitting-balance support: No upper extremity supported, Feet supported Sitting balance-Leahy Scale: Good     Standing balance support: No upper extremity supported Standing balance-Leahy Scale: Fair                             ADL either performed or assessed with clinical judgement   ADL Overall ADL's : Needs assistance/impaired Eating/Feeding: Modified independent;Sitting   Grooming: Standing;Wash/dry hands;Supervision/safety Grooming Details (indicate cue type and reason): standing at sink Upper Body Bathing: Set up;Sitting   Lower Body Bathing: Minimal assistance;Sitting/lateral leans;Sit to/from stand   Upper Body Dressing : Set up;Sitting   Lower Body Dressing: Min guard;Minimal assistance;Sitting/lateral leans;Sit to/from stand Lower Body Dressing Details (indicate cue type and reason): patient was able to complete figure four positioning of BLE. Toilet Transfer: Minimal assistance;Ambulation;Rolling walker (2 wheels) Toilet Transfer Details (indicate cue type and reason): with patient noted to have shuffling movments with RW. patient was able to take more confident steps without RW with min guard. patient's session was limited with IV noted to be leaking. nurse  called into room to adjust but unable to fix while patient was on commode. message was left with NT and receptionist that patient was in room sitting EOB waiting for nurse to return with son present. Toileting- Marine scientist and Hygiene: Min guard;Sit to/from stand       Functional mobility during ADLs: Min guard       Vision Patient Visual Report: No change from baseline       Perception     Praxis      Pertinent Vitals/Pain Pain Assessment Pain Assessment: Faces Faces Pain Scale: Hurts a little bit Pain Location: R flank Pain Descriptors / Indicators: Sore Pain Intervention(s): Limited activity within patient's tolerance, Monitored during session     Hand Dominance     Extremity/Trunk Assessment Upper Extremity Assessment Upper Extremity Assessment: Overall WFL for tasks assessed   Lower Extremity Assessment Lower Extremity Assessment: Defer to PT evaluation   Cervical / Trunk Assessment Cervical / Trunk Assessment: Normal   Communication Communication Communication: No difficulties   Cognition Arousal/Alertness: Awake/alert Behavior During Therapy: WFL for tasks assessed/performed Overall Cognitive Status: Within Functional Limits for tasks assessed                                 General Comments: very plesant, son in room as well     General Comments       Exercises     Shoulder Instructions      Home Living Family/patient expects to be discharged to:: Private residence Living Arrangements: Alone Available Help at Discharge: Family;Available PRN/intermittently Type of Home: House Home Access: Stairs to enter Entrance Stairs-Number of Steps: 1   Home Layout: One level               Home Equipment: None          Prior Functioning/Environment Prior Level of Function : Independent/Modified Independent               ADLs Comments: Pt states 2 weeks ago she was going to the gym and doing yoga        OT Problem List: Decreased activity tolerance;Impaired balance (sitting and/or standing);Decreased safety awareness;Decreased knowledge of use of DME or AE      OT Treatment/Interventions: Self-care/ADL training;Energy  conservation;Therapeutic exercise;DME and/or AE instruction;Patient/family education;Therapeutic activities    OT Goals(Current goals can be found in the care plan section) Acute Rehab OT Goals Patient Stated Goal: to get better OT Goal Formulation: With patient Time For Goal Achievement: 01/21/22 Potential to Achieve Goals: Fair  OT Frequency:      Co-evaluation              AM-PAC OT "6 Clicks" Daily Activity     Outcome Measure Help from another person eating meals?: None Help from another person taking care of personal grooming?: None Help from another person toileting, which includes using toliet, bedpan, or urinal?: A Little Help from another person bathing (including washing, rinsing, drying)?: A Little Help from another person to put on and taking off regular upper body clothing?: A Little Help from another person to put on and taking off regular lower body clothing?: A Little 6 Click Score: 20   End of Session Equipment Utilized During Treatment: Rolling walker (2 wheels) Nurse Communication: Other (comment) (called into room to address leaking IV)  Activity Tolerance: Patient tolerated treatment well Patient left: in bed;with call bell/phone within reach;with family/visitor present  OT Visit Diagnosis: Unsteadiness on feet (R26.81);Other abnormalities of gait and mobility (R26.89);Muscle weakness (generalized) (M62.81)                Time: 7829-5621 OT Time Calculation (min): 21 min Charges:  OT General Charges $OT Visit: 1 Visit OT Evaluation $OT Eval Low Complexity: 1 Low  Amara Justen OTR/L, MS Acute Rehabilitation Department Office# [phone]   Willa Rough 01/07/2022, 4:22 PM

## 2022-01-07 NOTE — Progress Notes (Addendum)
Subjective: No complaints. Pain improved. Tolerating PO without nausea/vomiting.   Objective: Vital signs in last 24 hours: Temp:  [97.5 F (36.4 C)-97.8 F (36.6 C)] 97.8 F (36.6 C) (12/20 0511) Pulse Rate:  [89-97] 92 (12/20 0511) Resp:  [16-18] 18 (12/20 0511) BP: (125-148)/(75-86) 145/86 (12/20 0511) SpO2:  [99 %-100 %] 100 % (12/20 0511) Weight:  [78.1 kg] 78.1 kg (12/20 0511)  Intake/Output from previous day: 12/19 0701 - 12/20 0700 In: 360 [P.O.:360] Out: 350 [Urine:350] Intake/Output this shift: No intake/output data recorded.  Physical Exam:  General: Alert and oriented CV: RRR Lungs: Clear Abdomen: Soft, ND GU: voiding spontaneously. R PCN to drainage. Ext: NT, No erythema  Lab Results: Recent Labs    01/05/22 0453 01/06/22 0622 01/07/22 0628  HGB 12.1 10.0* 10.4*  HCT 35.2* 31.9* 32.4*    BMET Recent Labs    01/06/22 0622 01/07/22 0628  NA 135 137  K 3.3* 3.5  CL 106 109  CO2 20* 19*  GLUCOSE 89 186*  BUN 34* 37*  CREATININE 2.02* 1.89*  CALCIUM 8.2* 8.0*      Studies/Results: IR NEPHROSTOMY PLACEMENT RIGHT  Result Date: 01/05/2022 INDICATION: 76 year old female referred for drainage of peripelvic renal cyst versus PCN for UPJ obstruction EXAM: IR NEPHROSTOMY PLACEMENT RIGHT COMPARISON:  CT 01/04/2022 MEDICATIONS: None ANESTHESIA/SEDATION: Fentanyl 100 mcg IV; Versed 2.0 mg IV Moderate Sedation Time:  14 minutes The patient was continuously monitored during the procedure by the interventional radiology nurse under my direct supervision. CONTRAST:  1m OMNIPAQUE IOHEXOL 300 MG/ML SOLN - administered into the collecting system(s) FLUOROSCOPY TIME:  Fluoroscopy Time: 0 minutes 36 seconds (7 mGy). COMPLICATIONS: None PROCEDURE: Informed written consent was obtained from the patient after a thorough discussion of the procedural risks, benefits and alternatives. All questions were addressed. Maximal Sterile Barrier Technique was utilized  including caps, mask, sterile gowns, sterile gloves, sterile drape, hand hygiene and skin antiseptic. A timeout was performed prior to the initiation of the procedure. Patient positioned prone position on the fluoroscopy table. Ultrasound survey of the right flank was performed with images stored and sent to PACs. The patient was then prepped and draped in the usual sterile fashion. 1% lidocaine was used to anesthetize the skin and subcutaneous tissues for local anesthesia. A Chiba needle was then used to access a posterior inferior calyx with ultrasound guidance. With spontaneous urine returned through the needle, passage of an 018 micro wire into the collecting system was performed under fluoroscopy. A small incision was made with an 11 blade scalpel, and the needle was removed from the wire. An Accustick system was then advanced over the wire into the collecting system under fluoroscopy. The metal stiffener and inner dilator were removed, and then a sample of fluid was aspirated through the 4 French outer sheath. Bentson wire was passed into the collecting system and the sheath removed. Ten French dilation of the soft tissues was performed. Using modified Seldinger technique, a 10 French pigtail catheter drain was placed over the Bentson wire. Wire and inner stiffener removed, and the pigtail was formed in the collecting system. Evacuation of a proximally 510 cc of frankly purulent urine was performed. Contrast injection confirmed position of the catheter. Patient tolerated the procedure well and remained hemodynamically stable throughout. No complications were encountered and no significant blood loss encountered IMPRESSION: Status post image guided placement of right-sided PCN. 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 Signed, Dulcy Fanny.  Nadene Rubins, RPVI [facility] Electronically Signed   By: Corrie Mckusick D.O.   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 Will plan for outpatient lasix renal scan, discussed this with patient and daughter this morning UTI Currently on CTX Follow-up cultures, cater accordingly Recommend 10-14 day course for complicated UTI AKI Cr improving but still elevated Anticipate continued improvement Continue to trend Cr and track I/Os 4. Indeterminant renal lesion: Seen on CT A/P 12/17. Will plan for non-urgent follow up imaging, likely MRI outpatient.   LOS: 3 days   Lamar Laundry 01/07/2022, 8:28 AM  I have seen and examined the patient and agree with the above assessment and plan.  Agree with above. R PCN to gravity. F/u Ucx and discharge home on culture specific abx for 10-14 days. Arranged f/u with me and will obtain repeat imaging then.  Matt R. [facility]  Pager: [phone]

## 2022-01-07 NOTE — Plan of Care (Signed)

## 2022-01-07 NOTE — Care Management Important Message (Signed)
Important Message  Patient Details IM Letter given. Name: Melissa Leon MRN: [id number] Date of Birth: 04/09/1945   Medicare Important Message Given:  Yes     Kerin Salen 01/07/2022, 9:52 AM

## 2022-01-08 DIAGNOSIS — N12 Tubulo-interstitial nephritis, not specified as acute or chronic: Secondary | ICD-10-CM | POA: Diagnosis not present

## 2022-01-08 LAB — BASIC METABOLIC PANEL
Anion gap: 7 (ref 5–15)
BUN: 24 mg/dL — ABNORMAL HIGH (ref 8–23)
CO2: 21 mmol/L — ABNORMAL LOW (ref 22–32)
Calcium: 8.1 mg/dL — ABNORMAL LOW (ref 8.9–10.3)
Chloride: 111 mmol/L (ref 98–111)
Creatinine, Ser: 1.45 mg/dL — ABNORMAL HIGH (ref 0.44–1.00)
GFR, Estimated: 37 mL/min — ABNORMAL LOW (ref >60)
Glucose, Bld: 153 mg/dL — ABNORMAL HIGH (ref 70–99)
Potassium: 3.3 mmol/L — ABNORMAL LOW (ref 3.5–5.1)
Sodium: 139 mmol/L (ref 135–145)

## 2022-01-08 LAB — CBC
HCT: 31.2 % — ABNORMAL LOW (ref 36.0–46.0)
Hemoglobin: 10.3 g/dL — ABNORMAL LOW (ref 12.0–15.0)
MCH: 31 pg (ref 26.0–34.0)
MCHC: 33 g/dL (ref 30.0–36.0)
MCV: 94 fL (ref 80.0–100.0)
Platelets: 373 10*3/uL (ref 150–400)
RBC: 3.32 MIL/uL — ABNORMAL LOW (ref 3.87–5.11)
RDW: 13.6 % (ref 11.5–15.5)
WBC: 14.5 10*3/uL — ABNORMAL HIGH (ref 4.0–10.5)
nRBC: 0 % (ref 0.0–0.2)

## 2022-01-08 LAB — GLUCOSE, CAPILLARY
Glucose-Capillary: 136 mg/dL — ABNORMAL HIGH (ref 70–99)
Glucose-Capillary: 147 mg/dL — ABNORMAL HIGH (ref 70–99)
Glucose-Capillary: 128 mg/dL — ABNORMAL HIGH (ref 70–99)
Glucose-Capillary: 140 mg/dL — ABNORMAL HIGH (ref 70–99)

## 2022-01-08 MED ORDER — CEFAZOLIN SODIUM-DEXTROSE 2-4 GM/100ML-% IV SOLN
2.0000 g | Freq: Three times a day (TID) | INTRAVENOUS | Status: DC
  Administered 2022-01-08 – 2022-01-09 (×3): 2 g via INTRAVENOUS
  Filled 2022-01-08 (×3): qty 100

## 2022-01-08 MED ORDER — SENNOSIDES-DOCUSATE SODIUM 8.6-50 MG PO TABS
1.0000 | ORAL_TABLET | Freq: Every day | ORAL | Status: DC
  Administered 2022-01-08 – 2022-01-09 (×2): 1 via ORAL
  Filled 2022-01-08 (×2): qty 1

## 2022-01-08 MED ORDER — POTASSIUM CHLORIDE CRYS ER 20 MEQ PO TBCR
40.0000 meq | EXTENDED_RELEASE_TABLET | Freq: Once | ORAL | Status: AC
  Administered 2022-01-08: 40 meq via ORAL
  Filled 2022-01-08: qty 2

## 2022-01-08 MED ORDER — AMLODIPINE BESYLATE 5 MG PO TABS
5.0000 mg | ORAL_TABLET | Freq: Every day | ORAL | Status: DC
  Administered 2022-01-08 – 2022-01-09 (×2): 5 mg via ORAL
  Filled 2022-01-08 (×2): qty 1

## 2022-01-08 NOTE — Progress Notes (Signed)
PROGRESS NOTE Melissa Leon  RKY:[id number] DOB: January 11, 1946 DOA: 01/04/2022 PCP: Katherina Mires, MD   Brief Narrative/Hospital Course: 76 year old female with T2DM hypertension presents to the ED for progressively worsening right flank pain feeling unwell for few weeks with low energy, decreased appetite, intermittent nausea.  She has had subjective fever.  She was recently started on Ozempic and having some intolerance.  In the ED She was seen in the [facility] on 12/15 found to have UTI discharged on Keflex despite which she continued to have worsening symptoms.  Urine culture showed E. coli from that visit. Seen in the ED again with low-grade fever, vitals fairly stable Labs showed worsening WBC count 721 from 15.2 on 12/15, creatinine about the same 1.3 , Lactic acidosis 2.3 UA with WBC more than 50 positive LE negative any urine blood culture sent, imaging obtained with x-ray abdomen CT abdomen with contrast> severe hydronephrosis of right kidney possible UPJ obstruction, perinephric fat stranding small left kidney stone lesion on the left kidney midpole MRI advised. Urology consulted,placed on antibiotics and admitted, s/p rt PCN placement by IR  Subjective:  Seen and examined.  She feels more energetic today less shortness on the bottom.   Creatinine improved WBC downtrending.   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  Urine cx w/ pansensitive E. coli (01/02/22).overall patient's kidney improving, AKI downtrending, leukocytosis improving/stable, hemodynamically stable. culture from IR > E. coli pansensitive antibiotics changed to Ancef, overall clinically improved.  Blood cultures no growth so far.Once stabilized urology planning for outpatient Lasix renal  scan. Recent Labs  Lab 01/04/22 1257 01/04/22 1714 01/04/22 1956 01/05/22 0453 01/06/22 0622 01/07/22 0628 01/08/22 0511  WBC 17.1*  --   --  22.9* 16.2* 16.5* 14.5*  LATICACIDVEN  --  2.3* 1.5  --   --   --   --      Acute kidney injury on CKD stage IIIb baseline creatinine around 1.3 in December. AKI multifactorial due to  obstructive nephropathy,sepsis ,CIN?.  Improved to baseline avoid nephrotoxic medication avoid hypotension, discontinue IV fluids today. Intake/Output Summary (Last 24 hours) at 01/08/2022 1128 Last data filed at 01/08/2022 0730 Gross per 24 hour  Intake 3531.29 ml  Output 2000 ml  Net 1531.29 ml     Nausea: Resolved  Hypokalemia replete po Hypomagnesemia: Improved Acute hyponatremia: resolved Hyperlipidemia resume home statin  DM2 with hyperglycemia : With hypoglycemia> resolved.  Stop IV fluids encourage oral intake.  Keep on ssi for now, cont to hold home Ozempic. Recent Labs  Lab 01/04/22 1257 01/04/22 1300 01/07/22 0731 01/07/22 1140 01/07/22 1647 01/07/22 2104 01/08/22 0758  GLUCAP  --    < > 149* 126* 120* 126* 136*  HGBA1C 5.6  --   --   --   --   --   --    < > = values in this interval not displayed.    Essential hypertension: BP uptrending amlodipine resumed.  Continue to hold HCTZ losartan > likely resume tomorrow   Soreness on the bottom for few days, improved.  Continue supportive care/Mediplex dressing.      DVT prophylaxis: SCDs Start: 01/04/22 1928 Code Status:   Code Status: Full Code Family Communication: plan of care discussed with patient at bedside.  Patient status is: inpatient  because of Sepsis Level  of care: Telemetry  Dispo: The patient is from: home alone , daughter lives close by             Anticipated disposition: Tomorrow if remains stable Objective: Vitals last 24 hrs: Vitals:   01/07/22 1948 01/08/22 0407 01/08/22 0500 01/08/22 0511  BP: (!) 149/63 (!) 190/96  (!) 169/81  Pulse: 91 87  89  Resp: 16 18     Temp: 97.9 F (36.6 C) 98.1 F (36.7 C)    TempSrc: Oral Oral    SpO2: 99% 99%    Weight:   80.8 kg   Height:       Weight change: 2.7 kg  Physical Examination: General exam: AAox3, weak,older appearing HEENT:Oral mucosa moist, Ear/Nose WNL grossly, dentition normal. Respiratory system: bilaterally clear BS,no use of accessory muscle Cardiovascular system: S1 & S2 +, regular rate, JVD neg. Gastrointestinal system: Abdomen soft,NT,ND,BS+ Nervous System:Alert, awake, moving extremities and grossly nonfocal Extremities: LE ankle edema neg, lower extremities warm Skin:No rashes,no icterus. JSH:FWYOVZ muscle bulk,tone, power   Medications reviewed:  Scheduled Meds:  amLODipine  5 mg Oral Daily   atorvastatin  10 mg Oral QHS   insulin aspart  0-6 Units Subcutaneous TID WC   senna-docusate  1 tablet Oral Daily   Continuous Infusions:   ceFAZolin (ANCEF) IV      Diet Order             Diet Carb Modified Fluid consistency: Thin; Room service appropriate? Yes  Diet effective now                  Intake/Output Summary (Last 24 hours) at 01/08/2022 1128 Last data filed at 01/08/2022 0730 Gross per 24 hour  Intake 3531.29 ml  Output 2000 ml  Net 1531.29 ml   Net IO Since Admission: 1,041.29 mL [01/08/22 1128]  Wt Readings from Last 3 Encounters:  01/08/22 80.8 kg  06/03/21 83.9 kg  05/30/21 83.9 kg   Unresulted Labs (From admission, onward)     Start     Ordered   01/06/22 8588  Basic metabolic panel  Daily,   R      01/05/22 0747   01/06/22 0500  CBC  Daily,   R      01/05/22 0747          Data Reviewed: I have personally reviewed following labs and imaging studies CBC: Recent Labs  Lab 01/02/22 1859 01/04/22 1257 01/05/22 0453 01/06/22 0622 01/07/22 0628 01/08/22 0511  WBC 15.2* 17.1* 22.9* 16.2* 16.5* 14.5*  NEUTROABS 13.0* 15.4* 20.2*  --   --   --   HGB 12.5 11.8* 12.1 10.0* 10.4* 10.3*  HCT 36.0 34.4* 35.2* 31.9* 32.4* 31.2*  MCV 89.8 91.5  91.0 96.4 96.7 94.0  PLT 292 290 426* 290 340 502   Basic Metabolic Panel: Recent Labs  Lab 01/04/22 1257 01/04/22 1956 01/05/22 0453 01/06/22 0622 01/07/22 0628 01/08/22 0511  NA 132*  --  132* 135 137 139  K 3.7  --  3.4* 3.3* 3.5 3.3*  CL 98  --  98 106 109 111  CO2 22  --  23 20* 19* 21*  GLUCOSE 207*  --  146* 89 186* 153*  BUN 26*  --  28* 34* 37* 24*  CREATININE 1.38*  --  1.73* 2.02* 1.89* 1.45*  CALCIUM 8.6*  --  8.8* 8.2* 8.0* 8.1*  MG  --  1.6* 2.2  --   --   --  GFR: Estimated Creatinine Clearance: 36.1 mL/min (A) (by C-G formula based on SCr of 1.45 mg/dL (H)). Liver Function Tests: Recent Labs  Lab 01/04/22 1257 01/05/22 0453  AST 23 43*  ALT 22 29  ALKPHOS 83 101  BILITOT 2.0* 1.7*  PROT 6.7 7.3  ALBUMIN 2.9* 2.9*    Recent Labs  Lab 01/05/22 0453  LIPASE 44   CBG: Recent Labs  Lab 01/07/22 0731 01/07/22 1140 01/07/22 1647 01/07/22 2104 01/08/22 0758  GLUCAP 149* 126* 120* 126* 136*   Sepsis Labs: Recent Labs  Lab 01/04/22 1714 01/04/22 1956  LATICACIDVEN 2.3* 1.5    Recent Results (from the past 240 hour(s))  Resp panel by RT-PCR (RSV, Flu A&B, Covid) Anterior Nasal Swab     Status: None   Collection Time: 01/02/22  6:59 PM   Specimen: Anterior Nasal Swab  Result Value Ref Range Status   SARS Coronavirus 2 by RT PCR NEGATIVE NEGATIVE Final    Comment: (NOTE) SARS-CoV-2 target nucleic acids are NOT DETECTED.  The SARS-CoV-2 RNA is generally detectable in upper respiratory specimens during the acute phase of infection. The lowest concentration of SARS-CoV-2 viral copies this assay can detect is 138 copies/mL. A negative result does not preclude SARS-Cov-2 infection and should not be used as the sole basis for treatment or other patient management decisions. A negative result may occur with  improper specimen collection/handling, submission of specimen other than nasopharyngeal swab, presence of viral mutation(s) within  the areas targeted by this assay, and inadequate number of viral copies(<138 copies/mL). A negative result must be combined with clinical observations, patient history, and epidemiological information. The expected result is Negative.  Fact Sheet for Patients:  EntrepreneurPulse.com.au  Fact Sheet for Healthcare Providers:  IncredibleEmployment.be  This test is no t yet approved or cleared by the [place] FDA and  has been authorized for detection and/or diagnosis of SARS-CoV-2 by FDA under an Emergency Use Authorization (EUA). This EUA will remain  in effect (meaning this test can be used) for the duration of the COVID-19 declaration under Section 564(b)(1) of the Act, 21 U.S.C.section 360bbb-3(b)(1), unless the authorization is terminated  or revoked sooner.       Influenza A by PCR NEGATIVE NEGATIVE Final   Influenza B by PCR NEGATIVE NEGATIVE Final    Comment: (NOTE) The Xpert Xpress SARS-CoV-2/FLU/RSV plus assay is intended as an aid in the diagnosis of influenza from Nasopharyngeal swab specimens and should not be used as a sole basis for treatment. Nasal washings and aspirates are unacceptable for Xpert Xpress SARS-CoV-2/FLU/RSV testing.  Fact Sheet for Patients: EntrepreneurPulse.com.au  Fact Sheet for Healthcare Providers: IncredibleEmployment.be  This test is not yet approved or cleared by the [place] FDA and has been authorized for detection and/or diagnosis of SARS-CoV-2 by FDA under an Emergency Use Authorization (EUA). This EUA will remain in effect (meaning this test can be used) for the duration of the COVID-19 declaration under Section 564(b)(1) of the Act, 21 U.S.C. section 360bbb-3(b)(1), unless the authorization is terminated or revoked.     Resp Syncytial Virus by PCR NEGATIVE NEGATIVE Final    Comment: (NOTE) Fact Sheet for  Patients: EntrepreneurPulse.com.au  Fact Sheet for Healthcare Providers: IncredibleEmployment.be  This test is not yet approved or cleared by the [place] FDA and has been authorized for detection and/or diagnosis of SARS-CoV-2 by FDA under an Emergency Use Authorization (EUA). This EUA will remain in effect (meaning this test can be used) for the duration of  the COVID-19 declaration under Section 564(b)(1) of the Act, 21 U.S.C. section 360bbb-3(b)(1), unless the authorization is terminated or revoked.  Performed at [facility], [address], [place], [place] [address]   Urine Culture     Status: Abnormal   Collection Time: 01/02/22  6:59 PM   Specimen: Urine, Clean Catch  Result Value Ref Range Status   Specimen Description   Final    URINE, CLEAN CATCH Performed at [facility], [address] [place], [place] [address]    Special Requests   Final    NONE Performed at [facility], [address], [place], [place] [address]    Culture >=100,000 COLONIES/mL ESCHERICHIA COLI (A)  Final   Report Status 01/05/2022 FINAL  Final   Organism ID, Bacteria ESCHERICHIA COLI (A)  Final      Susceptibility   Escherichia coli - MIC*    AMPICILLIN <=2 SENSITIVE Sensitive     CEFAZOLIN <=4 SENSITIVE Sensitive     CEFEPIME <=0.12 SENSITIVE Sensitive     CEFTRIAXONE <=0.25 SENSITIVE Sensitive     CIPROFLOXACIN <=0.25 SENSITIVE Sensitive     GENTAMICIN <=1 SENSITIVE Sensitive     IMIPENEM <=0.25 SENSITIVE Sensitive     NITROFURANTOIN <=16 SENSITIVE Sensitive     TRIMETH/SULFA <=20 SENSITIVE Sensitive     AMPICILLIN/SULBACTAM <=2 SENSITIVE Sensitive     PIP/TAZO <=4 SENSITIVE Sensitive     * >=100,000 COLONIES/mL ESCHERICHIA COLI  Urine Culture     Status: Abnormal   Collection Time: 01/04/22  4:37 PM   Specimen: Urine, Clean Catch  Result Value Ref Range Status   Specimen  Description   Final    URINE, CLEAN CATCH Performed at [facility], [place] [address] [place], [place] [address]    Special Requests   Final    NONE Performed at [facility], [place] [address] [place], [place] [address]    Culture (A)  Final    <10,000 COLONIES/mL INSIGNIFICANT GROWTH Performed at [facility] [address] [place], [place] [address]    Report Status 01/05/2022 FINAL  Final  Blood culture (routine x 2)     Status: None (Preliminary result)   Collection Time: 01/04/22  5:14 PM   Specimen: BLOOD  Result Value Ref Range Status   Specimen Description   Final    BLOOD LEFT ANTECUBITAL Performed at [facility] [address] [place], [place] [address]    Special Requests   Final    BOTTLES DRAWN AEROBIC AND ANAEROBIC Blood Culture adequate volume Performed at [facility] [address], [place], [place] [address]    Culture   Final    NO GROWTH 4 DAYS Performed at [facility], [place] [address], [place], [place] [address]    Report Status PENDING  Incomplete  Blood culture (routine x 2)     Status: None (Preliminary result)   Collection Time: 01/04/22  5:14 PM   Specimen: BLOOD  Result Value Ref Range Status   Specimen Description   Final    BLOOD RIGHT ANTECUBITAL Performed at [facility] [address] [place], [place] [address]    Special Requests   Final    BOTTLES DRAWN AEROBIC ONLY Blood Culture results may not be optimal due to an inadequate volume of blood received in culture bottles Performed at [facility] [address], [place], [place] [address]    Culture  Final    NO GROWTH 4 DAYS Performed at [facility], [place] [address], [place], [place] [address]    Report Status PENDING  Incomplete  Resp panel by RT-PCR (RSV, Flu A&B, Covid) Anterior Nasal Swab     Status: None   Collection Time: 01/04/22  5:14 PM    Specimen: Anterior Nasal Swab  Result Value Ref Range Status   SARS Coronavirus 2 by RT PCR NEGATIVE NEGATIVE Final    Comment: (NOTE) SARS-CoV-2 target nucleic acids are NOT DETECTED.  The SARS-CoV-2 RNA is generally detectable in upper respiratory specimens during the acute phase of infection. The lowest concentration of SARS-CoV-2 viral copies this assay can detect is 138 copies/mL. A negative result does not preclude SARS-Cov-2 infection and should not be used as the sole basis for treatment or other patient management decisions. A negative result may occur with  improper specimen collection/handling, submission of specimen other than nasopharyngeal swab, presence of viral mutation(s) within the areas targeted by this assay, and inadequate number of viral copies(<138 copies/mL). A negative result must be combined with clinical observations, patient history, and epidemiological information. The expected result is Negative.  Fact Sheet for Patients:  EntrepreneurPulse.com.au  Fact Sheet for Healthcare Providers:  IncredibleEmployment.be  This test is no t yet approved or cleared by the [place] FDA and  has been authorized for detection and/or diagnosis of SARS-CoV-2 by FDA under an Emergency Use Authorization (EUA). This EUA will remain  in effect (meaning this test can be used) for the duration of the COVID-19 declaration under Section 564(b)(1) of the Act, 21 U.S.C.section 360bbb-3(b)(1), unless the authorization is terminated  or revoked sooner.       Influenza A by PCR NEGATIVE NEGATIVE Final   Influenza B by PCR NEGATIVE NEGATIVE Final    Comment: (NOTE) The Xpert Xpress SARS-CoV-2/FLU/RSV plus assay is intended as an aid in the diagnosis of influenza from Nasopharyngeal swab specimens and should not be used as a sole basis for treatment. Nasal washings and aspirates are unacceptable for Xpert Xpress  SARS-CoV-2/FLU/RSV testing.  Fact Sheet for Patients: EntrepreneurPulse.com.au  Fact Sheet for Healthcare Providers: IncredibleEmployment.be  This test is not yet approved or cleared by the [place] FDA and has been authorized for detection and/or diagnosis of SARS-CoV-2 by FDA under an Emergency Use Authorization (EUA). This EUA will remain in effect (meaning this test can be used) for the duration of the COVID-19 declaration under Section 564(b)(1) of the Act, 21 U.S.C. section 360bbb-3(b)(1), unless the authorization is terminated or revoked.     Resp Syncytial Virus by PCR NEGATIVE NEGATIVE Final    Comment: (NOTE) Fact Sheet for Patients: EntrepreneurPulse.com.au  Fact Sheet for Healthcare Providers: IncredibleEmployment.be  This test is not yet approved or cleared by the [place] FDA and has been authorized for detection and/or diagnosis of SARS-CoV-2 by FDA under an Emergency Use Authorization (EUA). This EUA will remain in effect (meaning this test can be used) for the duration of the COVID-19 declaration under Section 564(b)(1) of the Act, 21 U.S.C. section 360bbb-3(b)(1), unless the authorization is terminated or revoked.  Performed at [facility], [place] [address], [place], [place] [address]   Aerobic/Anaerobic Culture w Gram Stain (surgical/deep wound)     Status: None (Preliminary result)   Collection Time: 01/05/22 11:26 AM   Specimen: Kidney; Urine  Result Value Ref Range Status   Specimen Description   Final    KIDNEY Performed at [facility], [place] [address], [place], [place] [address]    Special Requests   Final    NONE Performed at [facility], [place] [address], [place], [place] [address]    Gram Stain   Final    MODERATE WBC PRESENT, PREDOMINANTLY PMN RARE GRAM NEGATIVE RODS Performed at [facility] [address], [place], [place] [address]    Culture   Final    ABUNDANT ESCHERICHIA COLI NO ANAEROBES ISOLATED; CULTURE IN PROGRESS FOR 5 DAYS    Report Status PENDING  Incomplete   Organism ID, Bacteria ESCHERICHIA COLI  Final      Susceptibility   Escherichia coli - MIC*    AMPICILLIN <=2 SENSITIVE Sensitive     CEFAZOLIN <=4 SENSITIVE Sensitive     CEFEPIME <=0.12 SENSITIVE Sensitive     CEFTAZIDIME <=1 SENSITIVE Sensitive     CEFTRIAXONE <=0.25 SENSITIVE Sensitive     CIPROFLOXACIN <=0.25 SENSITIVE Sensitive     GENTAMICIN <=1 SENSITIVE Sensitive     IMIPENEM <=0.25 SENSITIVE Sensitive     TRIMETH/SULFA <=20 SENSITIVE Sensitive     AMPICILLIN/SULBACTAM <=2 SENSITIVE Sensitive     PIP/TAZO <=4 SENSITIVE Sensitive     * ABUNDANT ESCHERICHIA COLI    Antimicrobials: Anti-infectives (From admission, onward)    Start     Dose/Rate Route Frequency Ordered Stop   01/08/22 1400  ceFAZolin (ANCEF) IVPB 2g/100 mL premix        2 g 200 mL/hr over 30 Minutes Intravenous Every 8 hours 01/08/22 0701     01/07/22 1700  ceFAZolin (ANCEF) IVPB 2g/100 mL premix  Status:  Discontinued        2 g 200 mL/hr over 30 Minutes Intravenous Every 12 hours 01/07/22 1040 01/08/22 0701   01/05/22 1700  cefTRIAXone (ROCEPHIN) 2 g in sodium chloride 0.9 % 100 mL IVPB  Status:  Discontinued        2 g 200 mL/hr over 30 Minutes Intravenous Every 24 hours 01/05/22 0752 01/07/22 1040   01/05/22 1600  cefTRIAXone (ROCEPHIN) 1 g in sodium chloride 0.9 % 100 mL IVPB  Status:  Discontinued        1 g 200 mL/hr over 30 Minutes Intravenous Every 24 hours 01/04/22 1929 01/05/22 0747   01/05/22 1130  levofloxacin (LEVAQUIN) IVPB 500 mg        500 mg 100 mL/hr over 60 Minutes Intravenous  Once 01/05/22 1049 01/05/22 1431   01/05/22 0747  cefTRIAXone (ROCEPHIN) 2 g in sodium chloride 0.9 % 100 mL IVPB  Status:  Discontinued        2 g 200 mL/hr over 30 Minutes Intravenous Every 24 hours 01/05/22 0747 01/05/22 0752    01/04/22 1645  cefTRIAXone (ROCEPHIN) 1 g in sodium chloride 0.9 % 100 mL IVPB        1 g 200 mL/hr over 30 Minutes Intravenous  Once 01/04/22 1637 01/04/22 1817      Culture/Microbiology    Component Value Date/Time   SDES  01/05/2022 1126    KIDNEY Performed at [facility], [place] [address], [place], [place] [address]    SPECREQUEST  01/05/2022 1126    NONE Performed at [facility], [place] [address], [place], [place] [address]    CULT  01/05/2022 1126    ABUNDANT ESCHERICHIA COLI NO ANAEROBES ISOLATED; CULTURE IN PROGRESS FOR 5 DAYS    REPTSTATUS PENDING 01/05/2022 1126  Other culture-see note  Radiology Studies: No results  found.   LOS: 4 days   Antonieta Pert, MD [facility]  01/08/2022, 11:28 AM

## 2022-01-08 NOTE — Progress Notes (Signed)
Physical Therapy Treatment Patient Details Name: Melissa Leon MRN: [id number] DOB: [birth year] Today's Date: 01/08/2022   History of Present Illness Patient is a 76 year old female who presented with nausea, decreased energy and appetite. Patient was admitted with severe sepsis secondary to UTI, sever hydronephrosis of R kidney,right UPJ obstriction v.s parapelvic cyst.  PMH: HTN, DM II    PT Comments    Pt ambulated 400' without an assistive device, no loss of balance. She has met PT goals. No further PT indicated, will sign off. Pt was encouraged to ambulate in halls TID to minimize deconditioning during hospitalization.     Recommendations for follow up therapy are one component of a multi-disciplinary discharge planning process, led by the attending physician.  Recommendations may be updated based on patient status, additional functional criteria and insurance authorization.  Follow Up Recommendations  No PT follow up     Assistance Recommended at Discharge Set up Supervision/Assistance  Patient can return home with the following Assistance with cooking/housework;Assist for transportation   Equipment Recommendations  None recommended by PT    Recommendations for Other Services       Precautions / Restrictions Precautions Precautions: Fall;Other (comment) Precaution Comments: Nephrostomy drain on R Restrictions Weight Bearing Restrictions: No     Mobility  Bed Mobility               General bed mobility comments: up in recliner    Transfers Overall transfer level: Independent   Transfers: Sit to/from Stand Sit to Stand: Independent                Ambulation/Gait Ambulation/Gait assistance: Independent Gait Distance (Feet): 400 Feet Assistive device: None Gait Pattern/deviations: WFL(Within Functional Limits) Gait velocity: WFL     General Gait Details: steady, no loss of balance   Stairs             Wheelchair Mobility     Modified Rankin (Stroke Patients Only)       Balance     Sitting balance-Leahy Scale: Normal       Standing balance-Leahy Scale: Good                              Cognition Arousal/Alertness: Awake/alert Behavior During Therapy: WFL for tasks assessed/performed Overall Cognitive Status: Within Functional Limits for tasks assessed                                          Exercises      General Comments        Pertinent Vitals/Pain Pain Assessment Pain Assessment: No/denies pain Pain Score: 0-No pain    Home Living                          Prior Function            PT Goals (current goals can now be found in the care plan section) Acute Rehab PT Goals Patient Stated Goal: REgain IND PT Goal Formulation: With patient Time For Goal Achievement: 01/13/22 Potential to Achieve Goals: Good Progress towards PT goals: Goals met/education completed, patient discharged from PT    Frequency    Min 3X/week      PT Plan Current plan remains appropriate    Co-evaluation  AM-PAC PT "6 Clicks" Mobility   Outcome Measure  Help needed turning from your back to your side while in a flat bed without using bedrails?: None Help needed moving from lying on your back to sitting on the side of a flat bed without using bedrails?: None Help needed moving to and from a bed to a chair (including a wheelchair)?: None Help needed standing up from a chair using your arms (e.g., wheelchair or bedside chair)?: None Help needed to walk in hospital room?: None Help needed climbing 3-5 steps with a railing? : None 6 Click Score: 24    End of Session   Activity Tolerance: Patient tolerated treatment well Patient left: in chair;with family/visitor present;with call bell/phone within reach Nurse Communication: Mobility status PT Visit Diagnosis: Difficulty in walking, not elsewhere classified (R26.2);Muscle weakness  (generalized) (M62.81)     Time: 0037-9444 PT Time Calculation (min) (ACUTE ONLY): 15 min  Charges:  $Gait Training: 8-22 mins                     Blondell Reveal Kistler PT 01/08/2022  Acute Rehabilitation Services  Office [phone]

## 2022-01-08 NOTE — Plan of Care (Signed)

## 2022-01-09 DIAGNOSIS — N12 Tubulo-interstitial nephritis, not specified as acute or chronic: Secondary | ICD-10-CM | POA: Diagnosis not present

## 2022-01-09 LAB — CULTURE, BLOOD (ROUTINE X 2)
Culture: NO GROWTH
Culture: NO GROWTH
Special Requests: ADEQUATE

## 2022-01-09 LAB — BASIC METABOLIC PANEL
Anion gap: 8 (ref 5–15)
BUN: 21 mg/dL (ref 8–23)
CO2: 22 mmol/L (ref 22–32)
Calcium: 8 mg/dL — ABNORMAL LOW (ref 8.9–10.3)
Chloride: 108 mmol/L (ref 98–111)
Creatinine, Ser: 1.11 mg/dL — ABNORMAL HIGH (ref 0.44–1.00)
GFR, Estimated: 52 mL/min — ABNORMAL LOW (ref >60)
Glucose, Bld: 133 mg/dL — ABNORMAL HIGH (ref 70–99)
Potassium: 3.4 mmol/L — ABNORMAL LOW (ref 3.5–5.1)
Sodium: 138 mmol/L (ref 135–145)

## 2022-01-09 LAB — CBC
HCT: 31.3 % — ABNORMAL LOW (ref 36.0–46.0)
Hemoglobin: 10.3 g/dL — ABNORMAL LOW (ref 12.0–15.0)
MCH: 31 pg (ref 26.0–34.0)
MCHC: 32.9 g/dL (ref 30.0–36.0)
MCV: 94.3 fL (ref 80.0–100.0)
Platelets: 404 10*3/uL — ABNORMAL HIGH (ref 150–400)
RBC: 3.32 MIL/uL — ABNORMAL LOW (ref 3.87–5.11)
RDW: 13.5 % (ref 11.5–15.5)
WBC: 15 10*3/uL — ABNORMAL HIGH (ref 4.0–10.5)
nRBC: 0 % (ref 0.0–0.2)

## 2022-01-09 LAB — GLUCOSE, CAPILLARY: Glucose-Capillary: 123 mg/dL — ABNORMAL HIGH (ref 70–99)

## 2022-01-09 MED ORDER — POTASSIUM CHLORIDE CRYS ER 20 MEQ PO TBCR: 20.0000 meq | EXTENDED_RELEASE_TABLET | Freq: Every day | ORAL | Status: DC

## 2022-01-09 MED ORDER — POTASSIUM CHLORIDE CRYS ER 20 MEQ PO TBCR: 20.0000 meq | EXTENDED_RELEASE_TABLET | Freq: Every day | ORAL | 0 refills | Status: DC

## 2022-01-09 MED ORDER — CEPHALEXIN 500 MG PO CAPS: 500.0000 mg | ORAL_CAPSULE | Freq: Four times a day (QID) | ORAL | 0 refills | Status: AC

## 2022-01-09 MED ORDER — POTASSIUM CHLORIDE CRYS ER 20 MEQ PO TBCR
40.0000 meq | EXTENDED_RELEASE_TABLET | Freq: Once | ORAL | Status: AC
  Administered 2022-01-09: 40 meq via ORAL
  Filled 2022-01-09: qty 2

## 2022-01-09 NOTE — Plan of Care (Signed)

## 2022-01-09 NOTE — Discharge Summary (Signed)
Physician Discharge Summary  Melissa Leon DGL:[id number] DOB: [birth year] DOA: 01/04/2022  PCP: Katherina Mires, MD  Admit date: 01/04/2022 Discharge date: 01/09/2022 Recommendations for Outpatient Follow-up:  Follow up with PCP in 1 weeks-call for appointment Please obtain BMP/CBC in one week  Discharge Dispo: home Discharge Condition: Stable Code Status:   Code Status: Full Code Diet recommendation:  Diet Order             Diet Carb Modified Fluid consistency: Thin; Room service appropriate? Yes  Diet effective now                 Brief/Interim Summary: 76 year old female with T2DM hypertension presents to the ED for progressively worsening right flank pain feeling unwell for few weeks with low energy, decreased appetite, intermittent nausea.  She has had subjective fever.  She was recently started on Ozempic and having some intolerance.  In the ED She was seen in the [place] ED on 12/15 found to have UTI discharged on Keflex despite which she continued to have worsening symptoms.  Urine culture showed E. coli from that visit. Seen in the ED again with low-grade fever, vitals fairly stable Labs showed worsening WBC count 721 from 15.2 on 12/15, creatinine about the same 1.3 , Lactic acidosis 2.3 UA with WBC more than 50 positive LE negative any urine blood culture sent, imaging obtained with x-ray abdomen CT abdomen with contrast> severe hydronephrosis of right kidney possible UPJ obstruction, perinephric fat stranding small left kidney stone lesion on the left kidney midpole MRI advised. Urology consulted,placed on antibiotics and admitted, s/p rt PCN placement by IR .  Culture from nephrostomy with pansensitive E. coli, antibiotics changed to cefazolin> clinically feels much improved tolerating diet normal afebrile does have cytosis overall downtrending, AKI has resolved. Discussed with Dr. 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  Urine cx w/ pansensitive E. coli (01/02/22).overall patient's kidney improving, AKI downtrending, leukocytosis improving/stable, hemodynamically stable. culture from IR > E. coli pansensitive antibiotics changed to Ancef, overall clinically improved.  Blood cultures no growth so far.  Discussed with urology okay to discharge home to complete 10 more days of oral antibiotics> follow-up with urology for drain care Recent Labs  Lab 01/04/22 1714 01/04/22 1956 01/05/22 0453 01/06/22 0622 01/07/22 0628 01/08/22 0511 01/09/22 0535  WBC  --   --  22.9* 16.2* 16.5* 14.5* 15.0*  LATICACIDVEN 2.3* 1.5  --   --   --   --   --     Acute kidney injury on CKD stage IIIb baseline creatinine around 1.3 in December. AKI multifactorial due to  obstructive nephropathy,sepsis ,CIN?.  Improved nicely, creat clearance at 47 ml/min.  Check BMP from PCP in 5 days Intake/Output Summary (Last 24 hours) at 01/09/2022 1004 Last data filed at 01/09/2022 0745 Gross per 24 hour  Intake 730 ml  Output 2225 ml  Net -1495 ml    Nausea: Resolved  Hypokalemia repleting po> continue p.o. at home and recheck BMP  in 5 days Hypomagnesemia: Improved Acute hyponatremia: resolved Hyperlipidemia resume home statin  DM2 with hyperglycemia : With hypoglycemia> resolved.  Can resume metformin at discharge check sugar ACHS at home and consider resuming glimepiride if blood sugar above 190 and also need to discuss with PCP Recent Labs  Lab 01/04/22 1257  01/04/22 1300 01/08/22 0758 01/08/22 1147 01/08/22 1656 01/08/22 2105 01/09/22 0743  GLUCAP  --    < > 136* 147* 128* 140* 123*  HGBA1C 5.6  --   --   --   --   --   --    < > = values in this interval not displayed.   Essential hypertension: BP uptrending amlodipine resumed.  Resume losartan on discharge, blood pressure remains high resume HCTZ as outpatient ,advised to follow-up with PCP Soreness on the bottom for few days, improved.  Continue supportive care/Mediplex dressing.       Consults: IR, urology Subjective: Alert awake oriented resting comfortably no complaints Feels much energetic less fatigue, nephrostomy urine appears more clear  Discharge Exam: Vitals:   01/08/22 1957 01/09/22 0458  BP: (!) 166/90 (!) 159/86  Pulse: 91 91  Resp: 18 18  Temp: 98 F (36.7 C) 98.1 F (36.7 C)  SpO2: 100% 100%   General: Pt is alert, awake, not in acute distress Cardiovascular: RRR, S1/S2 +, no rubs, no gallops Respiratory: CTA bilaterally, no wheezing, no rhonchi Abdominal: Soft, NT, ND, bowel sounds + Extremities: no edema, no cyanosis  Discharge Instructions  Discharge Instructions     Discharge instructions   Complete by: As directed    Check CBC BMP from PCP in 5 days Please check blood pressure daily at home hold HCTZ for now and resume if blood pressure starts to go up >157 systolic and also discussed with PCP about that. Monitor blood sugar 4 times a day as below and hold glimepiride and consider resuming it if sugar starts going >190s  Please call call MD or return to ER for similar or worsening recurring problem that brought you to hospital or if any fever,nausea/vomiting,abdominal pain, uncontrolled pain, chest pain,  shortness of breath or any other alarming symptoms.  Please follow-up your doctor as instructed in a week time and call the office for appointment.  Please avoid alcohol, smoking, or any other illicit substance and maintain healthy habits  including taking your regular medications as prescribed.  You were cared for by a hospitalist during your hospital stay. If you have any questions about your discharge medications or the care you received while you were in the hospital after you are discharged, you can call the unit and ask to speak with the hospitalist on call if the hospitalist that took care of you is not available.  Once you are discharged, your primary care physician will handle any further medical issues. Please note that NO REFILLS for any discharge medications will be authorized once you are discharged, as it is imperative that you return to your primary care physician (or establish a relationship with a primary care physician if you do not have one) for your aftercare needs so that they can reassess your need for medications and monitor your lab values   Check blood sugar 3 times a day and bedtime at home. If blood sugar running above 200 less than 70 please call your MD to adjust insulin. If blood sugars running less 100 do not use insulin and call MD. If you noticed signs and symptoms of hypoglycemia or low blood  sugar like jitteriness, confusion, thirst, tremor, sweating- Check blood sugar, drink sugary drink/biscuits/sweets to increase sugar level and call MD or return to ER.   Discharge wound care:   Complete by: As directed    Can cover the site and take a shower change dressing as needed for soaking and call if any questions to urology office   Increase activity slowly   Complete by: As directed       Allergies as of 01/09/2022       Reactions   Choline Fenofibrate Other (See Comments)   ELEVATED LFT(s)   Penicillins Hives   Ozempic (0.25 Or 0.5 Mg-dose) [semaglutide(0.25 Or 0.'5mg'$ -dos)] Nausea Only, Other (See Comments)   SEVERE NAUSEA        Medication List     STOP taking these medications    glimepiride 4 MG tablet Commonly known as: AMARYL   hydrochlorothiazide 25 MG tablet Commonly known  as: HYDRODIURIL       TAKE these medications    amLODipine 5 MG tablet Commonly known as: NORVASC Take 5 mg by mouth at bedtime.   aspirin 81 MG chewable tablet Chew 81 mg by mouth at bedtime.   atorvastatin 10 MG tablet Commonly known as: LIPITOR Take 10 mg by mouth at bedtime.   cephALEXin 500 MG capsule Commonly known as: KEFLEX Take 1 capsule (500 mg total) by mouth 4 (four) times daily for 10 days. What changed: when to take this   losartan 100 MG tablet Commonly known as: COZAAR Take 100 mg by mouth in the morning.   Lumify 0.025 % Soln Generic drug: Brimonidine Tartrate Place 1 drop into both eyes 3 (three) times daily as needed (for redness).   metFORMIN 500 MG 24 hr tablet Commonly known as: GLUCOPHAGE-XR Take 1,000 mg by mouth 2 (two) times daily with a meal.   ondansetron 4 MG disintegrating tablet Commonly known as: ZOFRAN-ODT Take 1 tablet (4 mg total) by mouth every 8 (eight) hours as needed for nausea or vomiting.   potassium chloride SA 20 MEQ tablet Commonly known as: KLOR-CON M Take 1 tablet (20 mEq total) by mouth daily for 5 days. What changed: how much to take               Discharge Care Instructions  (From admission, onward)           Start     Ordered   01/09/22 0000  Discharge wound care:       Comments: Can cover the site and take a shower change dressing as needed for soaking and call if any questions to urology office   01/09/22 0958            Follow-up Information     Janith Lima, MD Follow up.   Specialty: Urology Contact information: [place] [address] [phone]         Katherina Mires, MD Follow up.   Specialty: Family Medicine Why: To check your CBC and BMP and manage blood pressure and diabetes Contact information: [address] [place] [address] [phone]                Allergies  Allergen Reactions   Choline Fenofibrate Other (See  Comments)    ELEVATED LFT(s)   Penicillins Hives   Ozempic (0.25 Or 0.5 Mg-Dose) [Semaglutide(0.25 Or 0.'5mg'$ -Dos)] Nausea Only and Other (See Comments)    SEVERE NAUSEA    The results of significant diagnostics  from this hospitalization (including imaging, microbiology, ancillary and laboratory) are listed below for reference.    Microbiology: Recent Results (from the past 240 hour(s))  Resp panel by RT-PCR (RSV, Flu A&B, Covid) Anterior Nasal Swab     Status: None   Collection Time: 01/02/22  6:59 PM   Specimen: Anterior Nasal Swab  Result Value Ref Range Status   SARS Coronavirus 2 by RT PCR NEGATIVE NEGATIVE Final    Comment: (NOTE) SARS-CoV-2 target nucleic acids are NOT DETECTED.  The SARS-CoV-2 RNA is generally detectable in upper respiratory specimens during the acute phase of infection. The lowest concentration of SARS-CoV-2 viral copies this assay can detect is 138 copies/mL. A negative result does not preclude SARS-Cov-2 infection and should not be used as the sole basis for treatment or other patient management decisions. A negative result may occur with  improper specimen collection/handling, submission of specimen other than nasopharyngeal swab, presence of viral mutation(s) within the areas targeted by this assay, and inadequate number of viral copies(<138 copies/mL). A negative result must be combined with clinical observations, patient history, and epidemiological information. The expected result is Negative.  Fact Sheet for Patients:  EntrepreneurPulse.com.au  Fact Sheet for Healthcare Providers:  IncredibleEmployment.be  This test is no t yet approved or cleared by the [place] FDA and  has been authorized for detection and/or diagnosis of SARS-CoV-2 by FDA under an Emergency Use Authorization (EUA). This EUA will remain  in effect (meaning this test can be used) for the duration of the COVID-19 declaration under  Section 564(b)(1) of the Act, 21 U.S.C.section 360bbb-3(b)(1), unless the authorization is terminated  or revoked sooner.       Influenza A by PCR NEGATIVE NEGATIVE Final   Influenza B by PCR NEGATIVE NEGATIVE Final    Comment: (NOTE) The Xpert Xpress SARS-CoV-2/FLU/RSV plus assay is intended as an aid in the diagnosis of influenza from Nasopharyngeal swab specimens and should not be used as a sole basis for treatment. Nasal washings and aspirates are unacceptable for Xpert Xpress SARS-CoV-2/FLU/RSV testing.  Fact Sheet for Patients: EntrepreneurPulse.com.au  Fact Sheet for Healthcare Providers: IncredibleEmployment.be  This test is not yet approved or cleared by the [place] FDA and has been authorized for detection and/or diagnosis of SARS-CoV-2 by FDA under an Emergency Use Authorization (EUA). This EUA will remain in effect (meaning this test can be used) for the duration of the COVID-19 declaration under Section 564(b)(1) of the Act, 21 U.S.C. section 360bbb-3(b)(1), unless the authorization is terminated or revoked.     Resp Syncytial Virus by PCR NEGATIVE NEGATIVE Final    Comment: (NOTE) Fact Sheet for Patients: EntrepreneurPulse.com.au  Fact Sheet for Healthcare Providers: IncredibleEmployment.be  This test is not yet approved or cleared by the [place] FDA and has been authorized for detection and/or diagnosis of SARS-CoV-2 by FDA under an Emergency Use Authorization (EUA). This EUA will remain in effect (meaning this test can be used) for the duration of the COVID-19 declaration under Section 564(b)(1) of the Act, 21 U.S.C. section 360bbb-3(b)(1), unless the authorization is terminated or revoked.  Performed at [facility], [address], [place], [place] [address]   Urine Culture     Status: Abnormal   Collection Time: 01/02/22  6:59 PM   Specimen:  Urine, Clean Catch  Result Value Ref Range Status   Specimen Description   Final    URINE, CLEAN CATCH Performed at [facility], [address], [place],  [address]  Special Requests   Final    NONE Performed at [facility], [address] [place], [place] [address]    Culture >=100,000 COLONIES/mL ESCHERICHIA COLI (A)  Final   Report Status 01/05/2022 FINAL  Final   Organism ID, Bacteria ESCHERICHIA COLI (A)  Final      Susceptibility   Escherichia coli - MIC*    AMPICILLIN <=2 SENSITIVE Sensitive     CEFAZOLIN <=4 SENSITIVE Sensitive     CEFEPIME <=0.12 SENSITIVE Sensitive     CEFTRIAXONE <=0.25 SENSITIVE Sensitive     CIPROFLOXACIN <=0.25 SENSITIVE Sensitive     GENTAMICIN <=1 SENSITIVE Sensitive     IMIPENEM <=0.25 SENSITIVE Sensitive     NITROFURANTOIN <=16 SENSITIVE Sensitive     TRIMETH/SULFA <=20 SENSITIVE Sensitive     AMPICILLIN/SULBACTAM <=2 SENSITIVE Sensitive     PIP/TAZO <=4 SENSITIVE Sensitive     * >=100,000 COLONIES/mL ESCHERICHIA COLI  Urine Culture     Status: Abnormal   Collection Time: 01/04/22  4:37 PM   Specimen: Urine, Clean Catch  Result Value Ref Range Status   Specimen Description   Final    URINE, CLEAN CATCH Performed at [facility], [place] [address], [place], [place] [address]    Special Requests   Final    NONE Performed at [facility], [place] [address] [place], [place] [address]    Culture (A)  Final    <10,000 COLONIES/mL INSIGNIFICANT GROWTH Performed at [facility] [address] [place], [place] [address]    Report Status 01/05/2022 FINAL  Final  Blood culture (routine x 2)     Status: None   Collection Time: 01/04/22  5:14 PM   Specimen: BLOOD  Result Value Ref Range Status   Specimen Description   Final    BLOOD LEFT ANTECUBITAL Performed at [facility] [address], [place], [place] [address]    Special  Requests   Final    BOTTLES DRAWN AEROBIC AND ANAEROBIC Blood Culture adequate volume Performed at [facility] [address] [place], [place] [address]    Culture   Final    NO GROWTH 5 DAYS Performed at [facility], [place] [address], [place], [place] [address]    Report Status 01/09/2022 FINAL  Final  Blood culture (routine x 2)     Status: None   Collection Time: 01/04/22  5:14 PM   Specimen: BLOOD  Result Value Ref Range Status   Specimen Description   Final    BLOOD RIGHT ANTECUBITAL Performed at [facility] [address], [place], [place] [address]    Special Requests   Final    BOTTLES DRAWN AEROBIC ONLY Blood Culture results may not be optimal due to an inadequate volume of blood received in culture bottles Performed at [facility] [address], [place], [place] [address]    Culture   Final    NO GROWTH 5 DAYS Performed at [facility], [place] [address] [place], [place] [address]    Report Status 01/09/2022 FINAL  Final  Resp panel by RT-PCR (RSV, Flu A&B, Covid) Anterior Nasal Swab     Status: None   Collection Time: 01/04/22  5:14 PM   Specimen: Anterior Nasal Swab  Result Value Ref Range Status   SARS Coronavirus 2 by RT PCR NEGATIVE NEGATIVE Final    Comment: (NOTE) SARS-CoV-2 target nucleic acids are NOT DETECTED.  The SARS-CoV-2 RNA is generally detectable in upper respiratory specimens during the acute phase of infection. The lowest concentration of SARS-CoV-2 viral copies this assay can detect is 138 copies/mL. A negative result does not preclude SARS-Cov-2 infection and should not be used as the sole basis for treatment or other patient management decisions. A negative result may occur with  improper specimen collection/handling, submission of specimen other than nasopharyngeal swab, presence of viral mutation(s) within the areas targeted by this assay, and inadequate number of  viral copies(<138 copies/mL). A negative result must be combined with clinical observations, patient history, and epidemiological information. The expected result is Negative.  Fact Sheet for Patients:  EntrepreneurPulse.com.au  Fact Sheet for Healthcare Providers:  IncredibleEmployment.be  This test is no t yet approved or cleared by the [place] FDA and  has been authorized for detection and/or diagnosis of SARS-CoV-2 by FDA under an Emergency Use Authorization (EUA). This EUA will remain  in effect (meaning this test can be used) for the duration of the COVID-19 declaration under Section 564(b)(1) of the Act, 21 U.S.C.section 360bbb-3(b)(1), unless the authorization is terminated  or revoked sooner.       Influenza A by PCR NEGATIVE NEGATIVE Final   Influenza B by PCR NEGATIVE NEGATIVE Final    Comment: (NOTE) The Xpert Xpress SARS-CoV-2/FLU/RSV plus assay is intended as an aid in the diagnosis of influenza from Nasopharyngeal swab specimens and should not be used as a sole basis for treatment. Nasal washings and aspirates are unacceptable for Xpert Xpress SARS-CoV-2/FLU/RSV testing.  Fact Sheet for Patients: EntrepreneurPulse.com.au  Fact Sheet for Healthcare Providers: IncredibleEmployment.be  This test is not yet approved or cleared by the [place] FDA and has been authorized for detection and/or diagnosis of SARS-CoV-2 by FDA under an Emergency Use Authorization (EUA). This EUA will remain in effect (meaning this test can be used) for the duration of the COVID-19 declaration under Section 564(b)(1) of the Act, 21 U.S.C. section 360bbb-3(b)(1), unless the authorization is terminated or revoked.     Resp Syncytial Virus by PCR NEGATIVE NEGATIVE Final    Comment: (NOTE) Fact Sheet for Patients: EntrepreneurPulse.com.au  Fact Sheet for Healthcare  Providers: IncredibleEmployment.be  This test is not yet approved or cleared by the [place] FDA and has been authorized for detection and/or diagnosis of SARS-CoV-2 by FDA under an Emergency Use Authorization (EUA). This EUA will remain in effect (meaning this test can be used) for the duration of the COVID-19 declaration under Section 564(b)(1) of the Act, 21 U.S.C. section 360bbb-3(b)(1), unless the authorization is terminated or revoked.  Performed at [facility], [place] [address] [place], [place] [address]   Aerobic/Anaerobic Culture w Gram Stain (surgical/deep wound)     Status: None (Preliminary result)   Collection Time: 01/05/22 11:26 AM   Specimen: Kidney; Urine  Result Value Ref Range Status   Specimen Description   Final    KIDNEY Performed at [facility] [address] [place], [place] [address]    Special Requests   Final    NONE Performed at [facility], [place] [address] [place], [place] [address]    Gram Stain   Final    MODERATE WBC PRESENT, PREDOMINANTLY PMN RARE GRAM NEGATIVE RODS Performed at [facility] [address], [place], [place] [address]    Culture   Final    ABUNDANT ESCHERICHIA COLI NO ANAEROBES ISOLATED; CULTURE IN PROGRESS FOR 5 DAYS  Report Status PENDING  Incomplete   Organism ID, Bacteria ESCHERICHIA COLI  Final      Susceptibility   Escherichia coli - MIC*    AMPICILLIN <=2 SENSITIVE Sensitive     CEFAZOLIN <=4 SENSITIVE Sensitive     CEFEPIME <=0.12 SENSITIVE Sensitive     CEFTAZIDIME <=1 SENSITIVE Sensitive     CEFTRIAXONE <=0.25 SENSITIVE Sensitive     CIPROFLOXACIN <=0.25 SENSITIVE Sensitive     GENTAMICIN <=1 SENSITIVE Sensitive     IMIPENEM <=0.25 SENSITIVE Sensitive     TRIMETH/SULFA <=20 SENSITIVE Sensitive     AMPICILLIN/SULBACTAM <=2 SENSITIVE Sensitive     PIP/TAZO <=4 SENSITIVE Sensitive     * ABUNDANT ESCHERICHIA COLI     Procedures/Studies: IR NEPHROSTOMY PLACEMENT RIGHT  Result Date: 01/05/2022 INDICATION: 76 year old female referred for drainage of peripelvic renal cyst versus PCN for UPJ obstruction EXAM: IR NEPHROSTOMY PLACEMENT RIGHT COMPARISON:  CT 01/04/2022 MEDICATIONS: None ANESTHESIA/SEDATION: Fentanyl 100 mcg IV; Versed 2.0 mg IV Moderate Sedation Time:  14 minutes The patient was continuously monitored during the procedure by the interventional radiology nurse under my direct supervision. CONTRAST:  71m OMNIPAQUE IOHEXOL 300 MG/ML SOLN - administered into the collecting system(s) FLUOROSCOPY TIME:  Fluoroscopy Time: 0 minutes 36 seconds (7 mGy). COMPLICATIONS: None PROCEDURE: Informed written consent was obtained from the patient after a thorough discussion of the procedural risks, benefits and alternatives. All questions were addressed. Maximal Sterile Barrier Technique was utilized including caps, mask, sterile gowns, sterile gloves, sterile drape, hand hygiene and skin antiseptic. A timeout was performed prior to the initiation of the procedure. Patient positioned prone position on the fluoroscopy table. Ultrasound survey of the right flank was performed with images stored and sent to PACs. The patient was then prepped and draped in the usual sterile fashion. 1% lidocaine was used to anesthetize the skin and subcutaneous tissues for local anesthesia. A Chiba needle was then used to access a posterior inferior calyx with ultrasound guidance. With spontaneous urine returned through the needle, passage of an 018 micro wire into the collecting system was performed under fluoroscopy. A small incision was made with an 11 blade scalpel, and the needle was removed from the wire. An Accustick system was then advanced over the wire into the collecting system under fluoroscopy. The metal stiffener and inner dilator were removed, and then a sample of fluid was aspirated through the 4 French outer sheath. Bentson wire was  passed into the collecting system and the sheath removed. Ten French dilation of the soft tissues was performed. Using modified Seldinger technique, a 10 French pigtail catheter drain was placed over the Bentson wire. Wire and inner stiffener removed, and the pigtail was formed in the collecting system. Evacuation of a proximally 510 cc of frankly purulent urine was performed. Contrast injection confirmed position of the catheter. Patient tolerated the procedure well and remained hemodynamically stable throughout. No complications were encountered and no significant blood loss encountered IMPRESSION: Status post image guided placement of right-sided PCN. 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 Signed, JDulcy Fanny WNadene Rubins RPVI [facility] Electronically Signed   By: JCorrie MckusickD.O.   On: 01/05/2022 11:38   CT ABDOMEN PELVIS W CONTRAST  Result Date: 01/04/2022 CLINICAL DATA:  Sepsis weakness EXAM: CT ABDOMEN AND PELVIS WITH CONTRAST  TECHNIQUE: Multidetector CT imaging of the abdomen and pelvis was performed using the standard protocol following bolus administration of intravenous contrast. RADIATION DOSE REDUCTION: This exam was performed according to the departmental dose-optimization program which includes automated exposure control, adjustment of the mA and/or kV according to patient size and/or use of iterative reconstruction technique. CONTRAST:  175m OMNIPAQUE IOHEXOL 300 MG/ML  SOLN COMPARISON:  Radiograph 01/04/2019 FINDINGS: Lower chest: Lung bases demonstrate no acute airspace disease. Hepatobiliary: Nonvisualized gallbladder likely due to prior cholecystectomy. Prominent extrahepatic common bile duct measures up to 16 mm. No intra hepatic biliary dilatation.  Pancreas: Unremarkable. No pancreatic ductal dilatation or surrounding inflammatory changes. Spleen: Normal in size without focal abnormality. Adrenals/Urinary Tract: Adrenal glands are normal. Small left kidney stone versus mild intrarenal vascular calcification. Severe hydronephrosis of the right kidney without hydroureter. Mildly dense debris within the right renal collecting system posteriorly, series 2, image 50. negative for ureteral stone. Bladder is normal. Stranding at the right renal pelvis. Mild bilateral perinephric nonspecific stranding. 10 mm possible indeterminate lesion mid pole left kidney, series 2, image 37. No significant excretion of contrast from right kidney on delayed views. Stomach/Bowel: The stomach is nonenlarged. No dilated small bowel. No acute bowel wall thickening. Vascular/Lymphatic: Moderate severe aortic atherosclerosis. No aneurysm. No suspicious lymph nodes. Mass effect on IVC by large right renal pelvis but with flow enhancement present. Reproductive: Uterus and bilateral adnexa are unremarkable. Other: Negative for pelvic effusion or free Musculoskeletal: Degenerative changes of the lumbar spine. No acute osseous abnormality IMPRESSION: 1. 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 Nonspecific perinephric fat stranding though moderate stranding adjacent to the right renal pelvis which could be due to infection or inflammation. No apparent contrast excretion from right kidney on delayed views consistent with decreased renal function. 2. Small nonobstructing left kidney stone. 3. 10 mm indeterminate lesion mid pole left kidney. When the patient is clinically stable and able to follow directions and hold their breath (preferably as an outpatient) further evaluation with dedicated abdominal MRI should be considered. 4. Prominent extrahepatic common bile duct likely due to prior cholecystectomy. Correlate  with LFTs. 5. Aortic atherosclerosis. Aortic Atherosclerosis (ICD10-I70.0). Electronically Signed   By: KDonavan FoilM.D.   On: 01/04/2022 18:15   DG Abdomen 1 View  Result Date: 01/03/2022 CLINICAL DATA:  Nausea EXAM: ABDOMEN - 1 VIEW COMPARISON:  None Available. FINDINGS: The bowel gas pattern is normal. No radio-opaque calculi or other significant radiographic abnormality are seen. IMPRESSION: Negative. Electronically Signed   By: KUlyses JarredM.D.   On: 01/03/2022 02:00    Labs: BNP (last 3 results) No results for input(s): "BNP" in the last 8760 hours. Basic Metabolic Panel: Recent Labs  Lab 01/04/22 1956 01/05/22 0453 01/06/22 0622 01/07/22 0628 01/08/22 0511 01/09/22 0535  NA  --  132* 135 137 139 138  K  --  3.4* 3.3* 3.5 3.3* 3.4*  CL  --  98 106 109 111 108  CO2  --  23 20* 19* 21* 22  GLUCOSE  --  146* 89 186* 153* 133*  BUN  --  28* 34* 37* 24* 21  CREATININE  --  1.73* 2.02* 1.89* 1.45* 1.11*  CALCIUM  --  8.8* 8.2* 8.0* 8.1* 8.0*  MG 1.6* 2.2  --   --   --   --    Liver Function Tests: Recent Labs  Lab 01/04/22 1257 01/05/22 0453  AST 23 43*  ALT  22 29  ALKPHOS 83 101  BILITOT 2.0* 1.7*  PROT 6.7 7.3  ALBUMIN 2.9* 2.9*   Recent Labs  Lab 01/05/22 0453  LIPASE 44   No results for input(s): "AMMONIA" in the last 168 hours. CBC: Recent Labs  Lab 01/02/22 1859 01/04/22 1257 01/05/22 0453 01/06/22 0622 01/07/22 0628 01/08/22 0511 01/09/22 0535  WBC 15.2* 17.1* 22.9* 16.2* 16.5* 14.5* 15.0*  NEUTROABS 13.0* 15.4* 20.2*  --   --   --   --   HGB 12.5 11.8* 12.1 10.0* 10.4* 10.3* 10.3*  HCT 36.0 34.4* 35.2* 31.9* 32.4* 31.2* 31.3*  MCV 89.8 91.5 91.0 96.4 96.7 94.0 94.3  PLT 292 290 426* 290 340 373 404*  CBG: Recent Labs  Lab 01/08/22 0758 01/08/22 1147 01/08/22 1656 01/08/22 2105 01/09/22 0743  GLUCAP 136* 147* 128* 140* 123*  Anemia work up No results for input(s): "VITAMINB12", "FOLATE", "FERRITIN", "TIBC", "IRON", "RETICCTPCT" in  the last 72 hours. Urinalysis    Component Value Date/Time   COLORURINE YELLOW 01/04/2022 1637   APPEARANCEUR HAZY (A) 01/04/2022 1637   LABSPEC 1.017 01/04/2022 1637   PHURINE 5.0 01/04/2022 1637   GLUCOSEU NEGATIVE 01/04/2022 1637   HGBUR NEGATIVE 01/04/2022 1637   BILIRUBINUR NEGATIVE 01/04/2022 1637   KETONESUR 5 (A) 01/04/2022 1637   PROTEINUR 100 (A) 01/04/2022 1637   NITRITE NEGATIVE 01/04/2022 1637   LEUKOCYTESUR LARGE (A) 01/04/2022 1637   Sepsis Labs Recent Labs  Lab 01/06/22 0622 01/07/22 0628 01/08/22 0511 01/09/22 0535  WBC 16.2* 16.5* 14.5* 15.0*   Microbiology Recent Results (from the past 240 hour(s))  Resp panel by RT-PCR (RSV, Flu A&B, Covid) Anterior Nasal Swab     Status: None   Collection Time: 01/02/22  6:59 PM   Specimen: Anterior Nasal Swab  Result Value Ref Range Status   SARS Coronavirus 2 by RT PCR NEGATIVE NEGATIVE Final    Comment: (NOTE) SARS-CoV-2 target nucleic acids are NOT DETECTED.  The SARS-CoV-2 RNA is generally detectable in upper respiratory specimens during the acute phase of infection. The lowest concentration of SARS-CoV-2 viral copies this assay can detect is 138 copies/mL. A negative result does not preclude SARS-Cov-2 infection and should not be used as the sole basis for treatment or other patient management decisions. A negative result may occur with  improper specimen collection/handling, submission of specimen other than nasopharyngeal swab, presence of viral mutation(s) within the areas targeted by this assay, and inadequate number of viral copies(<138 copies/mL). A negative result must be combined with clinical observations, patient history, and epidemiological information. The expected result is Negative.  Fact Sheet for Patients:  EntrepreneurPulse.com.au  Fact Sheet for Healthcare Providers:  IncredibleEmployment.be  This test is no t yet approved or cleared by the [place] FDA and  has been authorized for detection and/or diagnosis of SARS-CoV-2 by FDA under an Emergency Use Authorization (EUA). This EUA will remain  in effect (meaning this test can be used) for the duration of the COVID-19 declaration under Section 564(b)(1) of the Act, 21 U.S.C.section 360bbb-3(b)(1), unless the authorization is terminated  or revoked sooner.       Influenza A by PCR NEGATIVE NEGATIVE Final   Influenza B by PCR NEGATIVE NEGATIVE Final    Comment: (NOTE) The Xpert Xpress SARS-CoV-2/FLU/RSV plus assay is intended as an aid in the diagnosis of influenza from Nasopharyngeal swab specimens and should not be used as a sole basis for treatment. Nasal washings and aspirates are unacceptable for Xpert Xpress SARS-CoV-2/FLU/RSV  testing.  Fact Sheet for Patients: EntrepreneurPulse.com.au  Fact Sheet for Healthcare Providers: IncredibleEmployment.be  This test is not yet approved or cleared by the [place] FDA and has been authorized for detection and/or diagnosis of SARS-CoV-2 by FDA under an Emergency Use Authorization (EUA). This EUA will remain in effect (meaning this test can be used) for the duration of the COVID-19 declaration under Section 564(b)(1) of the Act, 21 U.S.C. section 360bbb-3(b)(1), unless the authorization is terminated or revoked.     Resp Syncytial Virus by PCR NEGATIVE NEGATIVE Final    Comment: (NOTE) Fact Sheet for Patients: EntrepreneurPulse.com.au  Fact Sheet for Healthcare Providers: IncredibleEmployment.be  This test is not yet approved or cleared by the [place] FDA and has been authorized for detection and/or diagnosis of SARS-CoV-2 by FDA under an Emergency Use Authorization (EUA). This EUA will remain in effect (meaning this test can be used) for the duration of the COVID-19 declaration under Section 564(b)(1) of the Act, 21 U.S.C. section  360bbb-3(b)(1), unless the authorization is terminated or revoked.  Performed at [facility], [address] [place], [place] [address]   Urine Culture     Status: Abnormal   Collection Time: 01/02/22  6:59 PM   Specimen: Urine, Clean Catch  Result Value Ref Range Status   Specimen Description   Final    URINE, CLEAN CATCH Performed at [facility], [address] [place], [place] [address]    Special Requests   Final    NONE Performed at [facility], [address], [place], [place] [address]    Culture >=100,000 COLONIES/mL ESCHERICHIA COLI (A)  Final   Report Status 01/05/2022 FINAL  Final   Organism ID, Bacteria ESCHERICHIA COLI (A)  Final      Susceptibility   Escherichia coli - MIC*    AMPICILLIN <=2 SENSITIVE Sensitive     CEFAZOLIN <=4 SENSITIVE Sensitive     CEFEPIME <=0.12 SENSITIVE Sensitive     CEFTRIAXONE <=0.25 SENSITIVE Sensitive     CIPROFLOXACIN <=0.25 SENSITIVE Sensitive     GENTAMICIN <=1 SENSITIVE Sensitive     IMIPENEM <=0.25 SENSITIVE Sensitive     NITROFURANTOIN <=16 SENSITIVE Sensitive     TRIMETH/SULFA <=20 SENSITIVE Sensitive     AMPICILLIN/SULBACTAM <=2 SENSITIVE Sensitive     PIP/TAZO <=4 SENSITIVE Sensitive     * >=100,000 COLONIES/mL ESCHERICHIA COLI  Urine Culture     Status: Abnormal   Collection Time: 01/04/22  4:37 PM   Specimen: Urine, Clean Catch  Result Value Ref Range Status   Specimen Description   Final    URINE, CLEAN CATCH Performed at [facility], [place] [address] [place], [place] [address]    Special Requests   Final    NONE Performed at [facility], [place] [address] [place], [place] [address]    Culture (A)  Final    <10,000 COLONIES/mL INSIGNIFICANT GROWTH Performed at [facility] [address] [place], [place] [address]    Report Status 01/05/2022 FINAL  Final  Blood culture (routine x 2)     Status: None    Collection Time: 01/04/22  5:14 PM   Specimen: BLOOD  Result Value Ref Range Status   Specimen Description   Final    BLOOD LEFT ANTECUBITAL Performed at [facility] [address] [place], [place] [address]    Special Requests   Final    BOTTLES DRAWN AEROBIC AND ANAEROBIC Blood Culture  adequate volume Performed at [facility] [address] [place], [place] [address]    Culture   Final    NO GROWTH 5 DAYS Performed at [facility], [place] [address] [place], [place] [address]    Report Status 01/09/2022 FINAL  Final  Blood culture (routine x 2)     Status: None   Collection Time: 01/04/22  5:14 PM   Specimen: BLOOD  Result Value Ref Range Status   Specimen Description   Final    BLOOD RIGHT ANTECUBITAL Performed at [facility] [address], [place], [place] [address]    Special Requests   Final    BOTTLES DRAWN AEROBIC ONLY Blood Culture results may not be optimal due to an inadequate volume of blood received in culture bottles Performed at [facility] [address] [place], [place] [address]    Culture   Final    NO GROWTH 5 DAYS Performed at [facility], [place] [address], [place], [place] [address]    Report Status 01/09/2022 FINAL  Final  Resp panel by RT-PCR (RSV, Flu A&B, Covid) Anterior Nasal Swab     Status: None   Collection Time: 01/04/22  5:14 PM   Specimen: Anterior Nasal Swab  Result Value Ref Range Status   SARS Coronavirus 2 by RT PCR NEGATIVE NEGATIVE Final    Comment: (NOTE) SARS-CoV-2 target nucleic acids are NOT DETECTED.  The SARS-CoV-2 RNA is generally detectable in upper respiratory specimens during the acute phase of infection. The lowest concentration of SARS-CoV-2 viral copies this assay can detect is 138 copies/mL. A negative result does not preclude SARS-Cov-2 infection and should not be used as the sole basis for treatment or other patient  management decisions. A negative result may occur with  improper specimen collection/handling, submission of specimen other than nasopharyngeal swab, presence of viral mutation(s) within the areas targeted by this assay, and inadequate number of viral copies(<138 copies/mL). A negative result must be combined with clinical observations, patient history, and epidemiological information. The expected result is Negative.  Fact Sheet for Patients:  EntrepreneurPulse.com.au  Fact Sheet for Healthcare Providers:  IncredibleEmployment.be  This test is no t yet approved or cleared by the [place] FDA and  has been authorized for detection and/or diagnosis of SARS-CoV-2 by FDA under an Emergency Use Authorization (EUA). This EUA will remain  in effect (meaning this test can be used) for the duration of the COVID-19 declaration under Section 564(b)(1) of the Act, 21 U.S.C.section 360bbb-3(b)(1), unless the authorization is terminated  or revoked sooner.       Influenza A by PCR NEGATIVE NEGATIVE Final   Influenza B by PCR NEGATIVE NEGATIVE Final    Comment: (NOTE) The Xpert Xpress SARS-CoV-2/FLU/RSV plus assay is intended as an aid in the diagnosis of influenza from Nasopharyngeal swab specimens and should not be used as a sole basis for treatment. Nasal washings and aspirates are unacceptable for Xpert Xpress SARS-CoV-2/FLU/RSV testing.  Fact Sheet for Patients: EntrepreneurPulse.com.au  Fact Sheet for Healthcare Providers: IncredibleEmployment.be  This test is not yet approved or cleared by the [place] FDA and has been authorized for detection and/or diagnosis of SARS-CoV-2 by FDA under an Emergency Use Authorization (EUA). This EUA will remain in effect (meaning this test can be used) for the duration of the COVID-19 declaration under Section 564(b)(1) of the Act, 21 U.S.C. section 360bbb-3(b)(1),  unless the authorization is terminated or revoked.  Resp Syncytial Virus by PCR NEGATIVE NEGATIVE Final    Comment: (NOTE) Fact Sheet for Patients: EntrepreneurPulse.com.au  Fact Sheet for Healthcare Providers: IncredibleEmployment.be  This test is not yet approved or cleared by the [place] FDA and has been authorized for detection and/or diagnosis of SARS-CoV-2 by FDA under an Emergency Use Authorization (EUA). This EUA will remain in effect (meaning this test can be used) for the duration of the COVID-19 declaration under Section 564(b)(1) of the Act, 21 U.S.C. section 360bbb-3(b)(1), unless the authorization is terminated or revoked.  Performed at [facility], [place] [address] [place], [place] [address]   Aerobic/Anaerobic Culture w Gram Stain (surgical/deep wound)     Status: None (Preliminary result)   Collection Time: 01/05/22 11:26 AM   Specimen: Kidney; Urine  Result Value Ref Range Status   Specimen Description   Final    KIDNEY Performed at [facility] [address], [place], [place] [address]    Special Requests   Final    NONE Performed at [facility], [place] [address], [place], [place] [address]    Gram Stain   Final    MODERATE WBC PRESENT, PREDOMINANTLY PMN RARE GRAM NEGATIVE RODS Performed at [facility] [address], [place], [place] [address]    Culture   Final    ABUNDANT ESCHERICHIA COLI NO ANAEROBES ISOLATED; CULTURE IN PROGRESS FOR 5 DAYS    Report Status PENDING  Incomplete   Organism ID, Bacteria ESCHERICHIA COLI  Final      Susceptibility   Escherichia coli - MIC*    AMPICILLIN <=2 SENSITIVE Sensitive     CEFAZOLIN <=4 SENSITIVE Sensitive     CEFEPIME <=0.12 SENSITIVE Sensitive     CEFTAZIDIME <=1 SENSITIVE Sensitive     CEFTRIAXONE <=0.25 SENSITIVE Sensitive     CIPROFLOXACIN <=0.25 SENSITIVE Sensitive     GENTAMICIN <=1  SENSITIVE Sensitive     IMIPENEM <=0.25 SENSITIVE Sensitive     TRIMETH/SULFA <=20 SENSITIVE Sensitive     AMPICILLIN/SULBACTAM <=2 SENSITIVE Sensitive     PIP/TAZO <=4 SENSITIVE Sensitive     * ABUNDANT ESCHERICHIA COLI   Time coordinating discharge: 25 minutes  SIGNED: Antonieta Pert, MD  [facility] 01/09/2022, 10:04 AM  If 7PM-7AM, please contact night-coverage [email or web address]

## 2022-01-10 LAB — AEROBIC/ANAEROBIC CULTURE W GRAM STAIN (SURGICAL/DEEP WOUND)

## 2022-02-17 ENCOUNTER — Other Ambulatory Visit: Payer: Self-pay | Admitting: Urology

## 2022-02-19 NOTE — Patient Instructions (Addendum)
SURGICAL WAITING ROOM VISITATION Patients having surgery or a procedure may have no more than 2 support people in the waiting area - these visitors may rotate.    If the patient needs to stay at the hospital during part of their recovery, the visitor guidelines for inpatient rooms apply. Pre-op nurse will coordinate an appropriate time for 1 support person to accompany patient in pre-op.  This support person may not rotate.    Please refer to the [facility] website for the visitor guidelines for Inpatients (after your surgery is over and you are in a regular room).   Due to an increase in RSV and influenza rates and associated hospitalizations, children ages 26 and under may not visit patients in [place].     Your procedure is scheduled on: 02-27-22   Report to [facility] Main Entrance    Report to admitting at 9:15 AM   Call this number if you have problems the morning of surgery [phone]   Do not eat food or drink liquids :After Midnight.           If you have questions, please contact your surgeon's office.   FOLLOW  ANY ADDITIONAL PRE OP INSTRUCTIONS YOU RECEIVED FROM YOUR SURGEON'S OFFICE!!!     Oral Hygiene is also important to reduce your risk of infection.                                    Remember - BRUSH YOUR TEETH THE MORNING OF SURGERY WITH YOUR REGULAR TOOTHPASTE   Do NOT smoke after Midnight   Take these medicines the morning of surgery with A SIP OF WATER:   Amlodipine  Prednisone  Tylenol if needed  Ondansetron if needed              How to Manage Your Diabetes Before and After Surgery  Why is it important to control my blood sugar before and after surgery? Improving blood sugar levels before and after surgery helps healing and can limit problems. A way of improving blood sugar control is eating a healthy diet by:  Eating less sugar and carbohydrates  Increasing activity/exercise  Talking with your doctor about reaching your  blood sugar goals High blood sugars (greater than 180 mg/dL) can raise your risk of infections and slow your recovery, so you will need to focus on controlling your diabetes during the weeks before surgery. Make sure that the doctor who takes care of your diabetes knows about your planned surgery including the date and location.  How do I manage my blood sugar before surgery? Check your blood sugar at least 4 times a day, starting 2 days before surgery, to make sure that the level is not too high or low. Check your blood sugar the morning of your surgery when you wake up and every 2 hours until you get to the Short Stay unit. If your blood sugar is less than 70 mg/dL, you will need to treat for low blood sugar: Do not take insulin. Treat a low blood sugar (less than 70 mg/dL) with  cup of clear juice (cranberry or apple), 4 glucose tablets, OR glucose gel. Recheck blood sugar in 15 minutes after treatment (to make sure it is greater than 70 mg/dL). If your blood sugar is not greater than 70 mg/dL on recheck, call [phone] for further instructions. Report your blood sugar to the short stay nurse  when you get to Short Stay.  If you are admitted to the hospital after surgery: Your blood sugar will be checked by the staff and you will probably be given insulin after surgery (instead of oral diabetes medicines) to make sure you have good blood sugar levels. The goal for blood sugar control after surgery is 80-180 mg/dL.   WHAT DO I DO ABOUT MY DIABETES MEDICATION?  Do not take oral diabetes medicines (pills) the morning of surgery.  DO NOT TAKE THE FOLLOWING 7 DAYS PRIOR TO SURGERY: Ozempic, Wegovy, Rybelsus (Semaglutide), Byetta (exenatide), Bydureon (exenatide ER), Victoza, Saxenda (liraglutide), or Trulicity (dulaglutide) Mounjaro (Tirzepatide) Adlyxin (Lixisenatide), Polyethylene Glycol Loxenatide.   Reviewed and Endorsed by [facility] Patient Education Committee, August 2015                   You may not have any metal on your body including hair pins, jewelry, and body piercing             Do not wear make-up, lotions, powders, perfumes or deodorant  Do not wear nail polish including gel and S&S, artificial/acrylic nails, or any other type of covering on natural nails including finger and toenails. If you have artificial nails, gel coating, etc. that needs to be removed by a nail salon please have this removed prior to surgery or surgery may need to be canceled/ delayed if the surgeon/ anesthesia feels like they are unable to be safely monitored.   Do not shave  48 hours prior to surgery.         Do not bring valuables to the hospital. [place].   Contacts, dentures or bridgework may not be worn into surgery.  DO NOT [place]. PHARMACY WILL DISPENSE MEDICATIONS LISTED ON YOUR MEDICATION LIST TO YOU DURING YOUR ADMISSION [place]!    Patients discharged on the day of surgery will not be allowed to drive home.  Someone NEEDS to stay with you for the first 24 hours after anesthesia.               Please read over the following fact sheets you were given: IF [place]  If you received a COVID test during your pre-op visit  it is requested that you wear a mask when out in public, stay away from anyone that may not be feeling well and notify your surgeon if you develop symptoms. If you test positive for Covid or have been in contact with anyone that has tested positive in the last 10 days please notify you surgeon.  [place] - Preparing for Surgery Before surgery, you can play an important role.  Because skin is not sterile, your skin needs to be as free of germs as possible.  You can reduce the number of germs on your skin by washing with CHG (chlorahexidine gluconate) soap before surgery.  CHG is an antiseptic cleaner which  kills germs and bonds with the skin to continue killing germs even after washing. Please DO NOT use if you have an allergy to CHG or antibacterial soaps.  If your skin becomes reddened/irritated stop using the CHG and inform your nurse when you arrive at Short Stay. Do not shave (including legs and underarms) for at least 48 hours prior to the first CHG shower.  You may shave your face/neck.  Please follow these instructions carefully:  1.  Shower with CHG Soap the night before surgery and the  morning of surgery.  2.  If you choose to wash your hair, wash your hair first as usual with your normal  shampoo.  3.  After you shampoo, rinse your hair and body thoroughly to remove the shampoo.                             4.  Use CHG as you would any other liquid soap.  You can apply chg directly to the skin and wash.  Gently with a scrungie or clean washcloth.  5.  Apply the CHG Soap to your body ONLY FROM THE NECK DOWN.   Do   not use on face/ open                           Wound or open sores. Avoid contact with eyes, ears mouth and   genitals (private parts).                       Wash face,  Genitals (private parts) with your normal soap.             6.  Wash thoroughly, paying special attention to the area where your    surgery  will be performed.  7.  Thoroughly rinse your body with warm water from the neck down.  8.  DO NOT shower/wash with your normal soap after using and rinsing off the CHG Soap.                9.  Pat yourself dry with a clean towel.            10.  Wear clean pajamas.            11.  Place clean sheets on your bed the night of your first shower and do not  sleep with pets. Day of Surgery : Do not apply any lotions/deodorants the morning of surgery.  Please wear clean clothes to the hospital/surgery center.  FAILURE TO FOLLOW THESE INSTRUCTIONS MAY RESULT IN THE CANCELLATION OF YOUR SURGERY  PATIENT SIGNATURE_________________________________  NURSE  SIGNATURE__________________________________  ________________________________________________________________________

## 2022-02-20 NOTE — Progress Notes (Signed)
COVID Vaccine Completed:  Date of COVID positive in last 90 days:  PCP - Suzanna Obey, MD Cardiologist -   Chest x-ray -  EKG - 01-05-22 Stress Test -  ECHO -  Cardiac Cath -  Pacemaker/ICD device last checked: Spinal Cord Stimulator:  Bowel Prep -   Sleep Study -  CPAP -   Fasting Blood Sugar -  Checks Blood Sugar _____ times a day  Last dose of GLP1 agonist-  N/A GLP1 instructions:  N/A   Last dose of SGLT-2 inhibitors-  N/A SGLT-2 instructions: N/A   Blood Thinner Instructions: Aspirin Instructions: Last Dose:  Activity level:  Can go up a flight of stairs and perform activities of daily living without stopping and without symptoms of chest pain or shortness of breath.  Able to exercise without symptoms  Unable to go up a flight of stairs without symptoms of     Anesthesia review:   Patient denies shortness of breath, fever, cough and chest pain at PAT appointment  Patient verbalized understanding of instructions that were given to them at the PAT appointment. Patient was also instructed that they will need to review over the PAT instructions again at home before surgery.

## 2022-02-23 ENCOUNTER — Encounter (HOSPITAL_COMMUNITY)
Admission: RE | Admit: 2022-02-23 | Discharge: 2022-02-23 | Disposition: A | Discharge status: Home / Self Care | Payer: Medicare HMO | Source: Ambulatory Visit | Attending: Urology | Admitting: Urology

## 2022-02-23 ENCOUNTER — Encounter (HOSPITAL_COMMUNITY): Payer: Self-pay

## 2022-02-23 ENCOUNTER — Other Ambulatory Visit: Payer: Self-pay

## 2022-02-23 VITALS — BP 158/78 | HR 92 | Temp 97.9°F | Resp 16 | Ht 67.0 in | Wt 173.0 lb

## 2022-02-23 DIAGNOSIS — Z01812 Encounter for preprocedural laboratory examination: Secondary | ICD-10-CM | POA: Insufficient documentation

## 2022-02-23 DIAGNOSIS — I10 Essential (primary) hypertension: Secondary | ICD-10-CM | POA: Diagnosis not present

## 2022-02-23 DIAGNOSIS — E119 Type 2 diabetes mellitus without complications: Secondary | ICD-10-CM

## 2022-02-23 DIAGNOSIS — D649 Anemia, unspecified: Secondary | ICD-10-CM

## 2022-02-23 HISTORY — DX: Pneumonia, unspecified organism: J18.9

## 2022-02-23 HISTORY — DX: Depression, unspecified: F32.A

## 2022-02-23 HISTORY — DX: Unspecified osteoarthritis, unspecified site: M19.90

## 2022-02-23 HISTORY — DX: Anemia, unspecified: D64.9

## 2022-02-23 LAB — GLUCOSE, CAPILLARY: Glucose-Capillary: 124 mg/dL — ABNORMAL HIGH (ref 70–99)

## 2022-02-24 LAB — CBC
HCT: 42.1 % (ref 36.0–46.0)
Hemoglobin: 12.9 g/dL (ref 12.0–15.0)
MCH: 30.1 pg (ref 26.0–34.0)
MCHC: 30.6 g/dL (ref 30.0–36.0)
MCV: 98.1 fL (ref 80.0–100.0)
Platelets: 733 10*3/uL — ABNORMAL HIGH (ref 150–400)
RBC: 4.29 MIL/uL (ref 3.87–5.11)
RDW: 13.8 % (ref 11.5–15.5)
WBC: 18.9 10*3/uL — ABNORMAL HIGH (ref 4.0–10.5)
nRBC: 0 % (ref 0.0–0.2)

## 2022-02-25 ENCOUNTER — Other Ambulatory Visit: Payer: Self-pay

## 2022-02-25 ENCOUNTER — Emergency Department (HOSPITAL_COMMUNITY): Payer: Medicare HMO

## 2022-02-25 ENCOUNTER — Encounter (HOSPITAL_COMMUNITY): Payer: Self-pay

## 2022-02-25 ENCOUNTER — Emergency Department (HOSPITAL_COMMUNITY)
Admission: EM | Admit: 2022-02-25 | Discharge: 2022-02-25 | Disposition: A | Discharge status: Home / Self Care | Payer: Medicare HMO | Source: Home / Self Care | Attending: Emergency Medicine | Admitting: Emergency Medicine

## 2022-02-25 DIAGNOSIS — M48062 Spinal stenosis, lumbar region with neurogenic claudication: Secondary | ICD-10-CM | POA: Diagnosis not present

## 2022-02-25 DIAGNOSIS — G061 Intraspinal abscess and granuloma: Secondary | ICD-10-CM | POA: Diagnosis not present

## 2022-02-25 DIAGNOSIS — M533 Sacrococcygeal disorders, not elsewhere classified: Secondary | ICD-10-CM | POA: Insufficient documentation

## 2022-02-25 DIAGNOSIS — Z7982 Long term (current) use of aspirin: Secondary | ICD-10-CM | POA: Insufficient documentation

## 2022-02-25 DIAGNOSIS — M545 Low back pain, unspecified: Secondary | ICD-10-CM | POA: Insufficient documentation

## 2022-02-25 DIAGNOSIS — R109 Unspecified abdominal pain: Secondary | ICD-10-CM | POA: Insufficient documentation

## 2022-02-25 LAB — CBC WITH DIFFERENTIAL/PLATELET
Abs Immature Granulocytes: 0.14 10*3/uL — ABNORMAL HIGH (ref 0.00–0.07)
Basophils Absolute: 0.1 10*3/uL (ref 0.0–0.1)
Basophils Relative: 0 %
Eosinophils Absolute: 0.1 10*3/uL (ref 0.0–0.5)
Eosinophils Relative: 0 %
HCT: 39.6 % (ref 36.0–46.0)
Hemoglobin: 12.6 g/dL (ref 12.0–15.0)
Immature Granulocytes: 1 %
Lymphocytes Relative: 8 %
Lymphs Abs: 1.5 10*3/uL (ref 0.7–4.0)
MCH: 30.1 pg (ref 26.0–34.0)
MCHC: 31.8 g/dL (ref 30.0–36.0)
MCV: 94.5 fL (ref 80.0–100.0)
Monocytes Absolute: 1 10*3/uL (ref 0.1–1.0)
Monocytes Relative: 6 %
Neutro Abs: 15.9 10*3/uL — ABNORMAL HIGH (ref 1.7–7.7)
Neutrophils Relative %: 85 %
Platelets: 552 10*3/uL — ABNORMAL HIGH (ref 150–400)
RBC: 4.19 MIL/uL (ref 3.87–5.11)
RDW: 13.5 % (ref 11.5–15.5)
WBC: 18.6 10*3/uL — ABNORMAL HIGH (ref 4.0–10.5)
nRBC: 0 % (ref 0.0–0.2)

## 2022-02-25 LAB — COMPREHENSIVE METABOLIC PANEL
ALT: 30 U/L (ref 0–44)
AST: 19 U/L (ref 15–41)
Albumin: 3.7 g/dL (ref 3.5–5.0)
Alkaline Phosphatase: 114 U/L (ref 38–126)
Anion gap: 13 (ref 5–15)
BUN: 26 mg/dL — ABNORMAL HIGH (ref 8–23)
CO2: 23 mmol/L (ref 22–32)
Calcium: 9.2 mg/dL (ref 8.9–10.3)
Chloride: 98 mmol/L (ref 98–111)
Creatinine, Ser: 0.79 mg/dL (ref 0.44–1.00)
GFR, Estimated: >60 mL/min (ref >60)
Glucose, Bld: 187 mg/dL — ABNORMAL HIGH (ref 70–99)
Potassium: 3.9 mmol/L (ref 3.5–5.1)
Sodium: 134 mmol/L — ABNORMAL LOW (ref 135–145)
Total Bilirubin: 0.5 mg/dL (ref 0.3–1.2)
Total Protein: 7.8 g/dL (ref 6.5–8.1)

## 2022-02-25 LAB — URINALYSIS, ROUTINE W REFLEX MICROSCOPIC
Bilirubin Urine: NEGATIVE
Glucose, UA: NEGATIVE mg/dL
Hgb urine dipstick: NEGATIVE
Ketones, ur: NEGATIVE mg/dL
Nitrite: NEGATIVE
Protein, ur: NEGATIVE mg/dL
Specific Gravity, Urine: 1.027 (ref 1.005–1.030)
pH: 5 (ref 5.0–8.0)

## 2022-02-25 LAB — LIPASE, BLOOD: Lipase: 45 U/L (ref 11–51)

## 2022-02-25 MED ORDER — MORPHINE SULFATE 15 MG PO TABS: 7.5000 mg | ORAL_TABLET | ORAL | 0 refills | Status: DC | PRN

## 2022-02-25 MED ORDER — OXYCODONE HCL 5 MG PO TABS
2.5000 mg | ORAL_TABLET | Freq: Once | ORAL | Status: AC
  Administered 2022-02-25: 2.5 mg via ORAL
  Filled 2022-02-25: qty 1

## 2022-02-25 MED ORDER — ACETAMINOPHEN 500 MG PO TABS
1000.0000 mg | ORAL_TABLET | Freq: Once | ORAL | Status: DC
  Filled 2022-02-25: qty 2

## 2022-02-25 MED ORDER — KETOROLAC TROMETHAMINE 15 MG/ML IJ SOLN
15.0000 mg | Freq: Once | INTRAMUSCULAR | Status: AC
  Administered 2022-02-25: 15 mg via INTRAVENOUS
  Filled 2022-02-25: qty 1

## 2022-02-25 MED ORDER — DICLOFENAC SODIUM 1 % EX GEL: 4.0000 g | Freq: Four times a day (QID) | CUTANEOUS | 0 refills | Status: DC

## 2022-02-25 NOTE — ED Notes (Signed)
Pt to CT

## 2022-02-25 NOTE — Discharge Instructions (Signed)
Your back pain is most likely due to a muscular strain.  There is been a lot of research on back pain, unfortunately the only thing that seems to really help is Tylenol and ibuprofen.  Relative rest is also important to not lift greater than 10 pounds bending or twisting at the waist.  Please follow-up with your family physician.  The other thing that really seems to benefit patients is physical therapy which your doctor may send you for.  Please return to the emergency department for new numbness or weakness to your arms or legs. Difficulty with urinating or urinating or pooping on yourself.  Also if you cannot feel toilet paper when you wipe or get a fever.   Use the gel as prescribed Also take tylenol 1000mg(2 extra strength) four times a day.   

## 2022-02-25 NOTE — ED Provider Notes (Signed)
[place] EMERGENCY DEPARTMENT AT [facility] Provider Note   CSN: [id number] Arrival date & time: 02/25/22  9702     History  Chief Complaint  Patient presents with   Flank Pain    Melissa Leon is a 77 y.o. female.  77 yo F with a chief complaints of left sided back pain.  She has a history of sciatica on that side but thinks this feels a bit different.  Mostly to the low portion of the back.  Been going on for about a week or so worse with movement and ambulation but got significantly worse last night.  She feels like both of her legs are weak which sometimes happens when she is sick.  She denies any significant urinary symptoms.  She did have a right-sided nephrostomy tube placed about a week ago and had a urinary tract infection.  She has a scheduled procedure in 2 days time with urology.   Flank Pain       Home Medications Prior to Admission medications   Medication Sig Start Date End Date Taking? Authorizing Provider  diclofenac Sodium (VOLTAREN) 1 % GEL Apply 4 g topically 4 (four) times daily. 02/25/22  Yes Deno Etienne, DO  morphine (MSIR) 15 MG tablet Take 0.5 tablets (7.5 mg total) by mouth every 4 (four) hours as needed for severe pain. 02/25/22  Yes Deno Etienne, DO  acetaminophen (TYLENOL) 650 MG CR tablet Take 650 mg by mouth every 8 (eight) hours as needed for pain.    [provider]  amLODipine (NORVASC) 5 MG tablet Take 5 mg by mouth at bedtime. 03/27/21   [provider]  aspirin 81 MG chewable tablet Chew 81 mg by mouth at bedtime.    [provider]  atorvastatin (LIPITOR) 10 MG tablet Take 10 mg by mouth at bedtime. 04/29/21   [provider]  Camphor-Menthol-Methyl Sal (SALONPAS) 3.01-25-08 % PTCH Place 1-2 patches onto the skin daily as needed (back pain).    [provider]  glimepiride (AMARYL) 4 MG tablet Take 4 mg by mouth daily. 01/28/22   [provider]  hydrochlorothiazide (HYDRODIURIL) 25 MG  tablet Take 25 mg by mouth every morning. 01/15/22   [provider]  losartan (COZAAR) 100 MG tablet Take 100 mg by mouth in the morning. 04/21/21   [provider]  metFORMIN (GLUCOPHAGE-XR) 500 MG 24 hr tablet Take 1,000 mg by mouth 2 (two) times daily with a meal. 03/27/21   [provider]  methylPREDNISolone (MEDROL DOSEPAK) 4 MG TBPK tablet Take by mouth as directed. 02/18/22   [provider]  ondansetron (ZOFRAN-ODT) 4 MG disintegrating tablet Take 1 tablet (4 mg total) by mouth every 8 (eight) hours as needed for nausea or vomiting. 01/03/22   Horton, Barbette Hair, MD  OVER THE COUNTER MEDICATION Apply 1 application  topically daily as needed (pain). CBD Serum    [provider]  potassium chloride SA (KLOR-CON M) 20 MEQ tablet Take 1 tablet (20 mEq total) by mouth daily for 5 days. 01/09/22 01/14/22  Antonieta Pert, MD  sulfamethoxazole-trimethoprim (BACTRIM DS) 800-160 MG tablet Take 1 tablet by mouth 2 (two) times daily. Patient not taking: Reported on 02/23/2022 02/12/22   [provider]      Allergies    Choline fenofibrate, Penicillins, and Ozempic (0.25 or 0.5 mg-dose) [semaglutide(0.25 or 0.'5mg'$ -dos)]    Review of Systems   Review of Systems  Genitourinary:  Positive for flank pain.  Physical Exam Updated Vital Signs BP 134/74   Pulse 83   Temp 97.9 F (36.6 C) (Oral)   Resp 17   SpO2 100%  Physical Exam Vitals and nursing note reviewed.  Constitutional:      General: She is not in acute distress.    Appearance: She is well-developed. She is not diaphoretic.  HENT:     Head: Normocephalic and atraumatic.  Eyes:     Pupils: Pupils are equal, round, and reactive to light.  Cardiovascular:     Rate and Rhythm: Normal rate and regular rhythm.     Heart sounds: No murmur heard.    No friction rub. No gallop.  Pulmonary:     Effort: Pulmonary effort is normal.     Breath sounds: No wheezing or rales.  Abdominal:      General: There is no distension.     Palpations: Abdomen is soft.     Tenderness: There is no abdominal tenderness.  Musculoskeletal:        General: No tenderness.     Cervical back: Normal range of motion and neck supple.     Comments: Points to the left SI joint area as area of discomfort.  Pulse motor and sensation intact distally.  Negative straight leg raise test.  Reflexes 2+ and equal.  No clonus.  No obvious midline spinal tenderness about deformities.  Skin:    General: Skin is warm and dry.  Neurological:     Mental Status: She is alert and oriented to person, place, and time.  Psychiatric:        Behavior: Behavior normal.     ED Results / Procedures / Treatments   Labs (all labs ordered are listed, but only abnormal results are displayed) Labs Reviewed  CBC WITH DIFFERENTIAL/PLATELET - Abnormal; Notable for the following components:      Result Value   WBC 18.6 (*)    Platelets 552 (*)    Neutro Abs 15.9 (*)    Abs Immature Granulocytes 0.14 (*)    All other components within normal limits  COMPREHENSIVE METABOLIC PANEL - Abnormal; Notable for the following components:   Sodium 134 (*)    Glucose, Bld 187 (*)    BUN 26 (*)    All other components within normal limits  URINALYSIS, ROUTINE W REFLEX MICROSCOPIC - Abnormal; Notable for the following components:   Leukocytes,Ua TRACE (*)    Bacteria, UA RARE (*)    All other components within normal limits  LIPASE, BLOOD    EKG None  Radiology CT Renal Stone Study  Result Date: 02/25/2022 CLINICAL DATA:  Abdominal and flank pain, has cystoscopy with nephrostomy tube placement on RIGHT side for biopsy last week. Complaining of LEFT side back and flank pain making it difficult to walk EXAM: CT ABDOMEN AND PELVIS WITHOUT CONTRAST TECHNIQUE: Multidetector CT imaging of the abdomen and pelvis was performed following the standard protocol without IV contrast. RADIATION DOSE REDUCTION: This exam was performed according to  the departmental dose-optimization program which includes automated exposure control, adjustment of the mA and/or kV according to patient size and/or use of iterative reconstruction technique. COMPARISON:  01/04/2022 FINDINGS: Lower chest: Small bibasilar pulmonary nodules, 3 mm LEFT lower lobe image 7, cavitary nodule RIGHT lower lobe 5 mm image 9, 4 mm RIGHT lower lobe image 18. Nodule versus atelectasis at base of RIGHT major fissure 10 mm image 24. These nodules are essentially unchanged. Hepatobiliary: Gallbladder surgically absent. Biliary dilatation similar to prior  exam. No focal hepatic abnormality. Pancreas: Normal appearance Spleen: Normal appearance Adrenals/Urinary Tract: Adrenal glands normal appearance. Nephrostomy tube coiled in decompressed RIGHT renal pelvis. 11 mm LEFT renal cyst unchanged image 37. No definite urinary tract calcification or dilatation. Excreted contrast within bladder. Ureters unremarkable. Stomach/Bowel: Scattered colonic diverticulosis and stool. No CT evidence of diverticulitis. Stomach decompressed, with inadequate assessment of gastric wall thickness. Small bowel loops unremarkable. Duodenal diverticulum noted. Vascular/Lymphatic: Atherosclerotic calcifications aorta, iliac arteries, coronary arteries, femoral arteries. Aorta normal caliber. Few pelvic phleboliths. No adenopathy. Reproductive: Unremarkable uterus and ovaries Other: No free air or free fluid. No hernia or inflammatory process. Musculoskeletal: Osseous demineralization. IMPRESSION: Nephrostomy tube coiled in decompressed RIGHT renal pelvis. Scattered colonic diverticulosis and stool without evidence of diverticulitis. Stable bibasilar pulmonary nodules. No acute intra-abdominal or intrapelvic abnormalities. Aortic Atherosclerosis (ICD10-I70.0). Electronically Signed   By: Lavonia Dana M.D.   On: 02/25/2022 11:07    Procedures Procedures    Medications Ordered in ED Medications  acetaminophen (TYLENOL)  tablet 1,000 mg (1,000 mg Oral Not Given 02/25/22 0954)  ketorolac (TORADOL) 15 MG/ML injection 15 mg (15 mg Intravenous Given 02/25/22 0953)  oxyCODONE (Oxy IR/ROXICODONE) immediate release tablet 2.5 mg (2.5 mg Oral Given 02/25/22 0109)    ED Course/ Medical Decision Making/ A&P                             Medical Decision Making Amount and/or Complexity of Data Reviewed Labs: ordered. Radiology: ordered.  Risk OTC drugs. Prescription drug management.   77 yo F with a chief complaints of left-sided low back pain.  The patient was just hospitalized for pyelonephritis and had a nephrostomy tube placed.  No fevers no obvious urinary symptoms.  No numbness or weakness to the legs.  Had a CT scan of the chest done yesterday that looked at the images there is no radiology read as of yet.  Will obtain a CT stone study today.  Treat pain and nausea.  Reassess.  CT without obvious intra-abdominal or intraspinal pathology.  Patient feeling mildly better on repeat assessment.  UA negative for infection.  Discharged home.  PCP follow-up.    I have discussed the diagnosis/risks/treatment options with the patient and family.  Evaluation and diagnostic testing in the emergency department does not suggest an emergent condition requiring admission or immediate intervention beyond what has been performed at this time.  They will follow up with PCP. We also discussed returning to the ED immediately if new or worsening sx occur. We discussed the sx which are most concerning (e.g., sudden worsening pain, fever, inability to tolerate by mouth) that necessitate immediate return. Medications administered to the patient during their visit and any new prescriptions provided to the patient are listed below.  Medications given during this visit Medications  acetaminophen (TYLENOL) tablet 1,000 mg (1,000 mg Oral Not Given 02/25/22 0954)  ketorolac (TORADOL) 15 MG/ML injection 15 mg (15 mg Intravenous Given 02/25/22 0953)   oxyCODONE (Oxy IR/ROXICODONE) immediate release tablet 2.5 mg (2.5 mg Oral Given 02/25/22 3235)     The patient appears reasonably screen and/or stabilized for discharge and I doubt any other medical condition or other [facility] requiring further screening, evaluation, or treatment in the ED at this time prior to discharge.          Final Clinical Impression(s) / ED Diagnoses Final diagnoses:  Acute left-sided low back pain without sciatica    Rx / DC  Orders ED Discharge Orders          Ordered    diclofenac Sodium (VOLTAREN) 1 % GEL  4 times daily        02/25/22 1128    morphine (MSIR) 15 MG tablet  Every 4 hours PRN        02/25/22 [place], [place], DO 02/25/22 1457

## 2022-02-25 NOTE — ED Triage Notes (Signed)
Pt states that last week she had a cystoscopy and nephrostomy tube placed on the R side for biopsy. Pt c/o L sided flank and back pain, making it difficult to walk. Pt's daughter reports that pt had blood work yesterday, and was concerned for high WBC and PLT count, though no provider called them about same.

## 2022-02-27 ENCOUNTER — Encounter (HOSPITAL_COMMUNITY): Payer: Self-pay | Admitting: Urology

## 2022-02-27 ENCOUNTER — Ambulatory Visit (HOSPITAL_COMMUNITY): Payer: Medicare HMO | Admitting: Physician Assistant

## 2022-02-27 ENCOUNTER — Ambulatory Visit (HOSPITAL_COMMUNITY): Payer: Medicare HMO

## 2022-02-27 ENCOUNTER — Ambulatory Visit (HOSPITAL_BASED_OUTPATIENT_CLINIC_OR_DEPARTMENT_OTHER): Payer: Medicare HMO | Admitting: Certified Registered Nurse Anesthetist

## 2022-02-27 ENCOUNTER — Emergency Department (HOSPITAL_COMMUNITY): Payer: Medicare HMO

## 2022-02-27 ENCOUNTER — Inpatient Hospital Stay (HOSPITAL_COMMUNITY)
Admission: EM | Admit: 2022-02-27 | Discharge: 2022-03-06 | DRG: 029 | Disposition: A | Discharge status: Home Health | Payer: Medicare HMO | Attending: Internal Medicine | Admitting: Internal Medicine

## 2022-02-27 ENCOUNTER — Other Ambulatory Visit: Payer: Self-pay

## 2022-02-27 ENCOUNTER — Encounter (HOSPITAL_COMMUNITY)
Admission: RE | Disposition: A | Discharge status: Home / Self Care | Payer: Self-pay | Source: Ambulatory Visit | Attending: Urology

## 2022-02-27 ENCOUNTER — Encounter (HOSPITAL_COMMUNITY): Payer: Self-pay | Admitting: Emergency Medicine

## 2022-02-27 ENCOUNTER — Ambulatory Visit (HOSPITAL_COMMUNITY)
Admission: RE | Admit: 2022-02-27 | Discharge: 2022-02-27 | Disposition: A | Discharge status: Home / Self Care | Payer: Medicare HMO | Source: Ambulatory Visit | Attending: Urology | Admitting: Urology

## 2022-02-27 DIAGNOSIS — Z87891 Personal history of nicotine dependence: Secondary | ICD-10-CM | POA: Insufficient documentation

## 2022-02-27 DIAGNOSIS — A4902 Methicillin resistant Staphylococcus aureus infection, unspecified site: Secondary | ICD-10-CM

## 2022-02-27 DIAGNOSIS — Z7982 Long term (current) use of aspirin: Secondary | ICD-10-CM

## 2022-02-27 DIAGNOSIS — N3090 Cystitis, unspecified without hematuria: Secondary | ICD-10-CM | POA: Insufficient documentation

## 2022-02-27 DIAGNOSIS — I10 Essential (primary) hypertension: Secondary | ICD-10-CM

## 2022-02-27 DIAGNOSIS — R200 Anesthesia of skin: Secondary | ICD-10-CM | POA: Diagnosis present

## 2022-02-27 DIAGNOSIS — Z888 Allergy status to other drugs, medicaments and biological substances status: Secondary | ICD-10-CM

## 2022-02-27 DIAGNOSIS — K5730 Diverticulosis of large intestine without perforation or abscess without bleeding: Secondary | ICD-10-CM | POA: Insufficient documentation

## 2022-02-27 DIAGNOSIS — Z87440 Personal history of urinary (tract) infections: Secondary | ICD-10-CM | POA: Insufficient documentation

## 2022-02-27 DIAGNOSIS — Z9049 Acquired absence of other specified parts of digestive tract: Secondary | ICD-10-CM

## 2022-02-27 DIAGNOSIS — M1990 Unspecified osteoarthritis, unspecified site: Secondary | ICD-10-CM | POA: Insufficient documentation

## 2022-02-27 DIAGNOSIS — Z9889 Other specified postprocedural states: Secondary | ICD-10-CM

## 2022-02-27 DIAGNOSIS — E119 Type 2 diabetes mellitus without complications: Secondary | ICD-10-CM

## 2022-02-27 DIAGNOSIS — K5903 Drug induced constipation: Secondary | ICD-10-CM | POA: Diagnosis not present

## 2022-02-27 DIAGNOSIS — F32A Depression, unspecified: Secondary | ICD-10-CM | POA: Insufficient documentation

## 2022-02-27 DIAGNOSIS — N131 Hydronephrosis with ureteral stricture, not elsewhere classified: Secondary | ICD-10-CM | POA: Diagnosis present

## 2022-02-27 DIAGNOSIS — R202 Paresthesia of skin: Secondary | ICD-10-CM | POA: Diagnosis present

## 2022-02-27 DIAGNOSIS — D649 Anemia, unspecified: Secondary | ICD-10-CM

## 2022-02-27 DIAGNOSIS — Z79899 Other long term (current) drug therapy: Secondary | ICD-10-CM

## 2022-02-27 DIAGNOSIS — G8929 Other chronic pain: Secondary | ICD-10-CM | POA: Diagnosis present

## 2022-02-27 DIAGNOSIS — E785 Hyperlipidemia, unspecified: Secondary | ICD-10-CM | POA: Insufficient documentation

## 2022-02-27 DIAGNOSIS — N12 Tubulo-interstitial nephritis, not specified as acute or chronic: Secondary | ICD-10-CM | POA: Diagnosis present

## 2022-02-27 DIAGNOSIS — Z880 Allergy status to penicillin: Secondary | ICD-10-CM

## 2022-02-27 DIAGNOSIS — N135 Crossing vessel and stricture of ureter without hydronephrosis: Secondary | ICD-10-CM

## 2022-02-27 DIAGNOSIS — B9562 Methicillin resistant Staphylococcus aureus infection as the cause of diseases classified elsewhere: Secondary | ICD-10-CM | POA: Diagnosis present

## 2022-02-27 DIAGNOSIS — G061 Intraspinal abscess and granuloma: Principal | ICD-10-CM | POA: Diagnosis present

## 2022-02-27 DIAGNOSIS — Z7984 Long term (current) use of oral hypoglycemic drugs: Secondary | ICD-10-CM

## 2022-02-27 DIAGNOSIS — Z87442 Personal history of urinary calculi: Secondary | ICD-10-CM | POA: Insufficient documentation

## 2022-02-27 DIAGNOSIS — E1165 Type 2 diabetes mellitus with hyperglycemia: Secondary | ICD-10-CM | POA: Diagnosis not present

## 2022-02-27 DIAGNOSIS — T40605A Adverse effect of unspecified narcotics, initial encounter: Secondary | ICD-10-CM | POA: Diagnosis not present

## 2022-02-27 DIAGNOSIS — Y92239 Unspecified place in hospital as the place of occurrence of the external cause: Secondary | ICD-10-CM | POA: Diagnosis not present

## 2022-02-27 DIAGNOSIS — M4800 Spinal stenosis, site unspecified: Secondary | ICD-10-CM

## 2022-02-27 DIAGNOSIS — M48062 Spinal stenosis, lumbar region with neurogenic claudication: Secondary | ICD-10-CM

## 2022-02-27 DIAGNOSIS — T380X5A Adverse effect of glucocorticoids and synthetic analogues, initial encounter: Secondary | ICD-10-CM | POA: Diagnosis not present

## 2022-02-27 DIAGNOSIS — M549 Dorsalgia, unspecified: Principal | ICD-10-CM

## 2022-02-27 DIAGNOSIS — R918 Other nonspecific abnormal finding of lung field: Secondary | ICD-10-CM | POA: Diagnosis present

## 2022-02-27 DIAGNOSIS — D72823 Leukemoid reaction: Secondary | ICD-10-CM | POA: Diagnosis not present

## 2022-02-27 DIAGNOSIS — M4656 Other infective spondylopathies, lumbar region: Secondary | ICD-10-CM | POA: Diagnosis present

## 2022-02-27 DIAGNOSIS — N136 Pyonephrosis: Secondary | ICD-10-CM | POA: Diagnosis present

## 2022-02-27 HISTORY — PX: CYSTOSCOPY WITH RETROGRADE PYELOGRAM, URETEROSCOPY AND STENT PLACEMENT: SHX5789

## 2022-02-27 HISTORY — DX: Spinal stenosis, site unspecified: M48.00

## 2022-02-27 LAB — COMPREHENSIVE METABOLIC PANEL
ALT: 108 U/L — ABNORMAL HIGH (ref 0–44)
AST: 38 U/L (ref 15–41)
Albumin: 2.8 g/dL — ABNORMAL LOW (ref 3.5–5.0)
Alkaline Phosphatase: 201 U/L — ABNORMAL HIGH (ref 38–126)
Anion gap: 7 (ref 5–15)
BUN: 24 mg/dL — ABNORMAL HIGH (ref 8–23)
CO2: 23 mmol/L (ref 22–32)
Calcium: 9.1 mg/dL (ref 8.9–10.3)
Chloride: 100 mmol/L (ref 98–111)
Creatinine, Ser: 0.82 mg/dL (ref 0.44–1.00)
GFR, Estimated: >60 mL/min (ref >60)
Glucose, Bld: 284 mg/dL — ABNORMAL HIGH (ref 70–99)
Potassium: 4.7 mmol/L (ref 3.5–5.1)
Sodium: 130 mmol/L — ABNORMAL LOW (ref 135–145)
Total Bilirubin: 1 mg/dL (ref 0.3–1.2)
Total Protein: 7.1 g/dL (ref 6.5–8.1)

## 2022-02-27 LAB — CBC WITH DIFFERENTIAL/PLATELET
Abs Immature Granulocytes: 0.1 10*3/uL — ABNORMAL HIGH (ref 0.00–0.07)
Basophils Absolute: 0 10*3/uL (ref 0.0–0.1)
Basophils Relative: 0 %
Eosinophils Absolute: 0 10*3/uL (ref 0.0–0.5)
Eosinophils Relative: 0 %
HCT: 32.5 % — ABNORMAL LOW (ref 36.0–46.0)
Hemoglobin: 10.8 g/dL — ABNORMAL LOW (ref 12.0–15.0)
Immature Granulocytes: 1 %
Lymphocytes Relative: 1 %
Lymphs Abs: 0.3 10*3/uL — ABNORMAL LOW (ref 0.7–4.0)
MCH: 30.3 pg (ref 26.0–34.0)
MCHC: 33.2 g/dL (ref 30.0–36.0)
MCV: 91.3 fL (ref 80.0–100.0)
Monocytes Absolute: 0.4 10*3/uL (ref 0.1–1.0)
Monocytes Relative: 2 %
Neutro Abs: 17.8 10*3/uL — ABNORMAL HIGH (ref 1.7–7.7)
Neutrophils Relative %: 96 %
Platelets: 355 10*3/uL (ref 150–400)
RBC: 3.56 MIL/uL — ABNORMAL LOW (ref 3.87–5.11)
RDW: 13.9 % (ref 11.5–15.5)
WBC: 18.6 10*3/uL — ABNORMAL HIGH (ref 4.0–10.5)
nRBC: 0 % (ref 0.0–0.2)

## 2022-02-27 LAB — GLUCOSE, CAPILLARY: Glucose-Capillary: 153 mg/dL — ABNORMAL HIGH (ref 70–99)

## 2022-02-27 SURGERY — CYSTOURETEROSCOPY, WITH RETROGRADE PYELOGRAM AND STENT INSERTION
Anesthesia: General | Laterality: Right

## 2022-02-27 MED ORDER — FENTANYL CITRATE (PF) 100 MCG/2ML IJ SOLN
INTRAMUSCULAR | Status: AC
  Filled 2022-02-27: qty 2

## 2022-02-27 MED ORDER — FENTANYL CITRATE PF 50 MCG/ML IJ SOSY
25.0000 ug | PREFILLED_SYRINGE | INTRAMUSCULAR | Status: DC | PRN
  Administered 2022-02-27 (×2): 25 ug via INTRAVENOUS

## 2022-02-27 MED ORDER — IOHEXOL 300 MG/ML  SOLN
100.0000 mL | Freq: Once | INTRAMUSCULAR | Status: AC | PRN
  Administered 2022-02-27: 100 mL via INTRAVENOUS

## 2022-02-27 MED ORDER — ONDANSETRON HCL 4 MG/2ML IJ SOLN
INTRAMUSCULAR | Status: AC
  Filled 2022-02-27: qty 2

## 2022-02-27 MED ORDER — SODIUM CHLORIDE (PF) 0.9 % IJ SOLN
INTRAMUSCULAR | Status: AC
  Filled 2022-02-27: qty 50

## 2022-02-27 MED ORDER — PROPOFOL 10 MG/ML IV BOLUS
INTRAVENOUS | Status: DC | PRN
  Administered 2022-02-27: 120 mg via INTRAVENOUS

## 2022-02-27 MED ORDER — MORPHINE SULFATE (PF) 4 MG/ML IV SOLN
4.0000 mg | Freq: Once | INTRAVENOUS | Status: AC
  Administered 2022-02-27: 4 mg via INTRAVENOUS
  Filled 2022-02-27: qty 1

## 2022-02-27 MED ORDER — IOHEXOL 300 MG/ML  SOLN
INTRAMUSCULAR | Status: DC | PRN
  Administered 2022-02-27: 2 mL

## 2022-02-27 MED ORDER — SODIUM CHLORIDE 0.9 % IR SOLN
Status: DC | PRN
  Administered 2022-02-27: 6000 mL

## 2022-02-27 MED ORDER — FENTANYL CITRATE PF 50 MCG/ML IJ SOSY
PREFILLED_SYRINGE | INTRAMUSCULAR | Status: AC
  Administered 2022-02-27: 25 ug via INTRAVENOUS
  Filled 2022-02-27: qty 1

## 2022-02-27 MED ORDER — LACTATED RINGERS IV SOLN: INTRAVENOUS | Status: DC

## 2022-02-27 MED ORDER — SODIUM CHLORIDE 0.9 % IV BOLUS
500.0000 mL | Freq: Once | INTRAVENOUS | Status: AC
  Administered 2022-02-27: 500 mL via INTRAVENOUS

## 2022-02-27 MED ORDER — CHLORHEXIDINE GLUCONATE 0.12 % MT SOLN
15.0000 mL | Freq: Once | OROMUCOSAL | Status: AC
  Administered 2022-02-27: 15 mL via OROMUCOSAL

## 2022-02-27 MED ORDER — FENTANYL CITRATE (PF) 100 MCG/2ML IJ SOLN
INTRAMUSCULAR | Status: DC | PRN
  Administered 2022-02-27 (×4): 25 ug via INTRAVENOUS

## 2022-02-27 MED ORDER — ONDANSETRON HCL 4 MG/2ML IJ SOLN: 4.0000 mg | Freq: Four times a day (QID) | INTRAMUSCULAR | Status: DC | PRN

## 2022-02-27 MED ORDER — ONDANSETRON HCL 4 MG/2ML IJ SOLN
INTRAMUSCULAR | Status: DC | PRN
  Administered 2022-02-27: 4 mg via INTRAVENOUS

## 2022-02-27 MED ORDER — OXYCODONE HCL 5 MG/5ML PO SOLN: 5.0000 mg | Freq: Once | ORAL | Status: AC | PRN

## 2022-02-27 MED ORDER — OXYCODONE-ACETAMINOPHEN 5-325 MG PO TABS: 1.0000 | ORAL_TABLET | ORAL | 0 refills | Status: DC | PRN

## 2022-02-27 MED ORDER — LIDOCAINE HCL (PF) 2 % IJ SOLN
INTRAMUSCULAR | Status: AC
  Filled 2022-02-27: qty 5

## 2022-02-27 MED ORDER — OXYCODONE HCL 5 MG PO TABS
ORAL_TABLET | ORAL | Status: AC
  Filled 2022-02-27: qty 1

## 2022-02-27 MED ORDER — ORAL CARE MOUTH RINSE: 15.0000 mL | Freq: Once | OROMUCOSAL | Status: AC

## 2022-02-27 MED ORDER — INSULIN ASPART 100 UNIT/ML IJ SOLN
4.0000 [IU] | Freq: Once | INTRAMUSCULAR | Status: AC
  Administered 2022-02-27: 4 [IU] via SUBCUTANEOUS
  Filled 2022-02-27: qty 1

## 2022-02-27 MED ORDER — DOCUSATE SODIUM 100 MG PO CAPS: 100.0000 mg | ORAL_CAPSULE | Freq: Every day | ORAL | 0 refills | Status: DC | PRN

## 2022-02-27 MED ORDER — GENTAMICIN SULFATE 40 MG/ML IJ SOLN
5.0000 mg/kg | INTRAVENOUS | Status: AC
  Administered 2022-02-27: 340 mg via INTRAVENOUS
  Filled 2022-02-27: qty 8.5

## 2022-02-27 MED ORDER — PROPOFOL 10 MG/ML IV BOLUS
INTRAVENOUS | Status: AC
  Filled 2022-02-27: qty 20

## 2022-02-27 MED ORDER — ONDANSETRON HCL 4 MG/2ML IJ SOLN
4.0000 mg | Freq: Once | INTRAMUSCULAR | Status: AC
  Administered 2022-02-27: 4 mg via INTRAVENOUS
  Filled 2022-02-27: qty 2

## 2022-02-27 MED ORDER — DEXAMETHASONE SODIUM PHOSPHATE 10 MG/ML IJ SOLN
INTRAMUSCULAR | Status: DC | PRN
  Administered 2022-02-27: 4 mg via INTRAVENOUS

## 2022-02-27 MED ORDER — DEXAMETHASONE SODIUM PHOSPHATE 10 MG/ML IJ SOLN
INTRAMUSCULAR | Status: AC
  Filled 2022-02-27: qty 1

## 2022-02-27 MED ORDER — OXYCODONE HCL 5 MG PO TABS
5.0000 mg | ORAL_TABLET | Freq: Once | ORAL | Status: AC | PRN
  Administered 2022-02-27: 5 mg via ORAL

## 2022-02-27 MED ORDER — LIDOCAINE 2% (20 MG/ML) 5 ML SYRINGE
INTRAMUSCULAR | Status: DC | PRN
  Administered 2022-02-27: 60 mg via INTRAVENOUS

## 2022-02-27 MED ORDER — FENTANYL CITRATE PF 50 MCG/ML IJ SOSY
PREFILLED_SYRINGE | INTRAMUSCULAR | Status: AC
  Filled 2022-02-27: qty 1

## 2022-02-27 SURGICAL SUPPLY — 26 items
BAG URO CATCHER STRL LF (MISCELLANEOUS) ×1 IMPLANT
BASKET ZERO TIP NITINOL 2.4FR (BASKET) IMPLANT
BSKT STON RTRVL ZERO TP 2.4FR (BASKET) ×1
CATH URETERAL DUAL LUMEN 10F (MISCELLANEOUS) IMPLANT
CATH URETL OPEN END 6FR 70 (CATHETERS) IMPLANT
CLOTH BEACON ORANGE TIMEOUT ST (SAFETY) ×1 IMPLANT
EXTRACTOR STONE NITINOL NGAGE (UROLOGICAL SUPPLIES) IMPLANT
GLOVE BIOGEL M 7.0 STRL (GLOVE) ×1 IMPLANT
GOWN STRL REUS W/ TWL XL LVL3 (GOWN DISPOSABLE) ×1 IMPLANT
GOWN STRL REUS W/TWL XL LVL3 (GOWN DISPOSABLE) ×1
GUIDEWIRE ANG ZIPWIRE 038X150 (WIRE) IMPLANT
GUIDEWIRE STR DUAL SENSOR (WIRE) ×2 IMPLANT
IV NS 1000ML (IV SOLUTION) ×1
IV NS 1000ML BAXH (IV SOLUTION) ×1 IMPLANT
LASER FIB FLEXIVA PULSE ID 365 (Laser) IMPLANT
LOOP CUT BIPOLAR 24F LRG (ELECTROSURGICAL) IMPLANT
MANIFOLD NEPTUNE II (INSTRUMENTS) ×1 IMPLANT
PACK CYSTO (CUSTOM PROCEDURE TRAY) ×1 IMPLANT
SHEATH NAVIGATOR HD 11/13X28 (SHEATH) IMPLANT
SHEATH NAVIGATOR HD 11/13X36 (SHEATH) IMPLANT
SHEATH NAVIGATOR HD 12/14X46 (SHEATH) IMPLANT
STENT URET 6FRX24 CONTOUR (STENTS) IMPLANT
TRACTIP FLEXIVA PULS ID 200XHI (Laser) IMPLANT
TRACTIP FLEXIVA PULSE ID 200 (Laser)
TUBING CONNECTING 10 (TUBING) ×1 IMPLANT
TUBING UROLOGY SET (TUBING) ×1 IMPLANT

## 2022-02-27 NOTE — Anesthesia Postprocedure Evaluation (Signed)
Anesthesia Post Note  Patient: [name]  Procedure(s) Performed: TURBT, CYSTOSCOPY WITH RIGHT RETROGRADE PYELOGRAM, RIGHT URETEROSCOPY, BIOPSY AND STENT PLACEMENT (Right)     Patient location during evaluation: PACU Anesthesia Type: General Level of consciousness: awake and alert Pain management: pain level controlled Vital Signs Assessment: post-procedure vital signs reviewed and stable Respiratory status: spontaneous breathing, nonlabored ventilation, respiratory function stable and patient connected to nasal cannula oxygen Cardiovascular status: blood pressure returned to baseline and stable Postop Assessment: no apparent nausea or vomiting Anesthetic complications: no   No notable events documented.  Last Vitals:  Vitals:   02/27/22 1345 02/27/22 1405  BP: (!) 151/71 (!) 170/75  Pulse: 84 80  Resp: 17   Temp:  36.5 C  SpO2: 96% 97%    Last Pain:  Vitals:   02/27/22 1405  TempSrc:   PainSc: 0-No pain                 Hennessey Cantrell S

## 2022-02-27 NOTE — Discharge Instructions (Signed)

## 2022-02-27 NOTE — Op Note (Signed)
Operative Note  Preoperative diagnosis:  1. Right UPJ obstruction  Postoperative diagnosis: 1.  Right UPJ obstruction 2. Bladder inflammation  Procedure(s): 1.  Cystoscopy 2. Right ureteroscopy with renal pelvis biopsy 3. Right retrograde pyelogram 4. Right ureteral stent placement 5. Fluoroscopy with intraoperative interpretation 6. TURBT small 7.  Removal of right percutaneous nephrostomy tube  Surgeon: Rexene Alberts, MD  Assistants:  None  Anesthesia:  General  Complications:  None  EBL:  Minimal  Specimens: 1.  ID Type Source Tests Collected by Time Destination  A : Right Renal Pelvic Body Fluid PATH Other CYTOLOGY - NON PAP Janith Lima, MD 02/27/2022 1219   Right renal pelvis lesion Left lateral bladder wall lesion  Drains/Catheters: 1.  6Fr x 24cm ureteral stent without a tether string  Intraoperative findings:   Cystoscopy demonstrated erythema over her left lateral wall.  This was sected without evidence of perforation with excellent hemostasis. Right retrograde pyelogram demonstrated moderate right hydronephrosis.  There is no abrupt caliber change at the right UPJ. Right ureteroscopy demonstrated no papillary lesion within the right renal pelvis.  There is a small amount of bullous edema that was biopsied with a basket and removed.  Overall, no suspicious findings for malignancy.  Wide open right ureteropelvic junction with no evidence of intrinsic compression.  There was bullous edema at the area of the right UPJ. Successful right ureteral stent placement.  Successful removal of right percutaneous urostomy tube.  Indication:  Melissa Leon is a 77 y.o. 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  CT imaging demonstrated finding concerning for possible neoplasm of the renal pelvis.  She is being brought back to the operative today for cystoscopy, right ureteroscopy with possible biopsy.  She has been poorly  trialing her nephrostomy tube and would like to have this internalized to a right ureteral stent.  Description of procedure: After informed consent was obtained from the patient, the patient was identified and taken to the operating room and placed in the supine position.  General anesthesia was administered as well as perioperative IV antibiotics.  At the beginning of the case, a time-out was performed to properly identify the patient, the surgery to be performed, and the surgical site.  Sequential compression devices were applied to the lower extremities at the beginning of the case for DVT prophylaxis.  The patient was then placed in the dorsal lithotomy supine position, prepped and draped in sterile fashion.  We then passed the 21-French rigid cystoscope through the urethra and into the bladder under vision without any difficulty, noting a normal urethra.  Systemic evaluation of the bladder revealed erythema over the left lateral wall.  I could not rule out possibility of CIS.  This was resected the end of the case with excellent hemostasis.  Ureteral orifices were in normal position.   Under cystoscopic and flouroscopic guidance, we cannulated the ureteral orifice with a 5-French open-ended ureteral catheter and a gentle retrograde pyelogram was performed, revealing a normal caliber ureter without any filling defects.  There was moderate right-sided hydronephrosis.  I then passed a dual lumen catheter into the distal right ureter and passed a separate 0.038 sensor wire leaving 2 wires in the right kidney.  1 was secured to the drape a safety wire.  There were the other wire, I passed a flexible digital ureteroscope and surveyed the kidney with each calyx x 3.  Overall, there is no findings concerning for malignancy.  There  was bullous edema at the area of her right UPJ that was biopsied using a grasper.  Excellent hemostasis was obtained.  Overall, this appeared to be inflammatory tissue rather  malignancy.  The scope was removed from the ureter with no evidence of trauma.  I left a wire in place.  I then transition to a resectoscope and resected her left lateral wall.  A portion was resected about 2 x 2 cm.  There was no evidence of perforation.  Excellent hemostasis was obtained.  The areas were fulgurated.  I then advanced a 6 [place] by 24 cm stent over the previously placed wire in her right kidney noting a curl within the proximal renal pelvis and bladder respectively.  Cystoscope was reintroduced with send adequate position.  I then drained her bladder.  I then proceeded to remove the right nephrostomy tube.  A dressing was applied.  Final fluoroscopy demonstrated stent in appropriate position.  The patient tolerated the procedure well and there was no complication. Patient was awoken from anesthesia and taken to the recovery room in stable condition. I was present and scrubbed for the entirety of the case.  Plan:  Patient will be discharged home.  She will follow-up with me to review findings.  Will plan for serial stent exchanges.   Matt R. [facility]  Pager: [phone]

## 2022-02-27 NOTE — ED Triage Notes (Addendum)
Presents for bilateral leg pain and numbness that started weeks ago but has been worse for the last 3 days such that she has been immobile.   Was taking morphine q4h but urologist changed yesterday to percocet (last at 5pm)  Was seen on 2/7 for same but was able to walk at that visit, now unable to ambulate at all and pain increased.    H/o nephrostomy and recent cystoscopy

## 2022-02-27 NOTE — H&P (Signed)
Office Visit Report     02/13/2022   --------------------------------------------------------------------------------   Melissa Leon  MRN: [id number]  DOB: 1945/09/02, 77 year old Female  SSN:    PRIMARY CARE:  Suzanna Obey, MD  PRIMARY CARE FAX:  [phone]  REFERRING:  Janith Lima, MD  PROVIDER:  Rexene Alberts, M.D.  LOCATION:  [facility] [phone]     --------------------------------------------------------------------------------   CC/HPI: Melissa Leon is a 77 year old female who is seen in followup today for a right ureteropelvic junction obstruction, possible right upper tract lesion, indeterminate left renal lesion, right pyelonephrisis and lung nodules.   #1. Right UPJ obstruction:  She presented to the ED on 01/04/2022 with complaints of 1 week history of right-sided flank pain along with nausea and poor p.o. intake. She was previously diagnosed with urinary tract infection and discharged with a course of Keflex.  CT A/P 01/04/2022 demonstrated severe hydronephrosis of the right kidney without evidence of hydroureter with possible UPJ obstruction. There was no apparent contrast excretion from the right kidney on delayed views.  Urine culture 01/04/2022 resulted less than 10K insignificant growth.  She had persistent pain and nausea and underwent right nephrostomy tube placement on 01/05/2022.  -She initially did well with nephrostomy tube drainage for about a month however developed worsening right-sided flank pain and pyelonephritis. Urine culture 02/09/2022 with >100K MRSA sensitive to Bactrim.  -She has been afebrile for the past 48 hours. She denies significant right-sided flank pain. PCN has been draining clear yellow urine. She denies fevers, chills. She has been voiding appropriately.  -CT A/P 02/12/2022: Right PCN in appropriate position with resolution of right hydro, focal soft tissue prominence in the region of the right UPJ, urothelial  carcinoma cannot be excluded, small poorly defined areas of decreased enhancement suspicious for pyelonephritis, new ill-defined nodule opacities in both lung bases, largest measuring 1.5 cm.   She endorses a history recurrent UTIs in her 76s. She denies any further workup at this time. As a child, she had a long history of nocturnal enuresis. She denies history of urolithiasis. She denies prior history of Dietl crisis.   #2. Indeterminate left renal lesion: CT A/P 01/04/2022 incidentally revealed a 10 mm indeterminate lesion in the midpole of the left kidney. She denies a family history of urologic malignancy.  -No mention of this was on the initial re grams per radiology report on CT A/P 02/12/2022. [facility] radiology has been contacted and they will addend report.   #3. Urolithiasis: CT A/P 01/04/2022 revealed a small nonobstructing stone in the left kidney. She denies prior history of urolithiasis. She has never passed stones. She has not required surgery for urolithiasis.   4. Right pyelonephritis: Urine culture 02/09/2022 with >100K MRSA. CT A/P 02/12/2022 concerning for right sided plantar fasciitis. She has been afebrile. She remains on Bactrim.   #5. Bilateral lung nodules: New lung nodules seen on CT A/P 02/12/2022. She denies cough, fevers, chills. She denies personal history of lung cancer. She denies family history of lung cancer.   She has a past medical history of arthritis, diabetes, hypertension, hyperlipidemia. She had a cholecystectomy in 1977 and fibroid removed in 1979.     ALLERGIES: Penicillin    MEDICATIONS: Aspirin 81 mg tablet,chewable  Bactrim Ds 800 mg-160 mg tablet 1 tablet PO BID  Cipro 500 mg tablet 1 tablet PO BID  Hydrochlorothiazide 25 mg tablet  Omeprazole 20 mg capsule,delayed release  Amlodipine Besylate 5 mg tablet  Atorvastatin Calcium 10  mg tablet  Co Q-10 30 mg capsule  Cyclobenzaprine Hcl 10 mg tablet  Fish Oil  Gabapentin 300 mg capsule   Glimepiride 4 mg tablet  Losartan Potassium 100 mg tablet  Metformin Er Gastric 500 mg tablet, er gastric retention 24 hr  Potassium Chloride 20 meq tablet, extended release  Vitamin B12  Vitamin D3     GU PSH: Locm 300-399Mg/Ml Iodine,1Ml - 02/12/2022       PSH Notes: Fibroid Tumor Removal - 1979   NON-GU PSH: Cholecystectomy (laparoscopic) - 1977     GU PMH: Hydronephrosis - 02/12/2022, - 02/10/2022, - 02/09/2022, - 01/28/2022 Urinary Tract Inf, Unspec site - 02/10/2022, - 02/09/2022, - 01/28/2022 Kidney Failure Unspec    NON-GU PMH: Arthritis Diabetes Type 2 Hypercholesterolemia Hypertension    FAMILY HISTORY: 1 son - Runs in Family 3 daughters - Runs in Family Tuberculosis - Runs in Family   SOCIAL HISTORY: Marital Status: Widowed Ethnicity: Not Hispanic Or Latino; Race: White Current Smoking Status: Patient does not smoke anymore. Has not smoked since 01/19/1973. Smoked for 6 years.   Tobacco Use Assessment Completed: Used Tobacco in last 30 days? Has never drank.  Drinks 1 caffeinated drink per day.    REVIEW OF SYSTEMS:    GU Review Female:   Patient denies frequent urination, hard to postpone urination, burning /pain with urination, get up at night to urinate, leakage of urine, stream starts and stops, trouble starting your stream, have to strain to urinate, and being pregnant.  Gastrointestinal (Upper):   Patient denies vomiting, indigestion/ heartburn, and nausea.  Gastrointestinal (Lower):   Patient denies diarrhea and constipation.  Constitutional:   Patient denies fever, night sweats, weight loss, and fatigue.  Skin:   Patient denies skin rash/ lesion and itching.  Eyes:   Patient denies blurred vision and double vision.  Ears/ Nose/ Throat:   Patient denies sore throat and sinus problems.  Hematologic/Lymphatic:   Patient denies swollen glands and easy bruising.  Cardiovascular:   Patient denies leg swelling and chest pains.  Respiratory:   Patient denies  cough and shortness of breath.  Endocrine:   Patient denies excessive thirst.  Musculoskeletal:   Patient denies back pain and joint pain.  Neurological:   Patient denies headaches and dizziness.  Psychologic:   Patient denies depression and anxiety.   VITAL SIGNS: None   MULTI-SYSTEM PHYSICAL EXAMINATION:    Constitutional: Well-nourished. No physical deformities. Normally developed. Good grooming.  Respiratory: No labored breathing, no use of accessory muscles.   Cardiovascular: Normal temperature, normal extremity pulses, no swelling, no varicosities.  Gastrointestinal: No mass, no tenderness, no rigidity, non obese abdomen.     Complexity of Data:  Source Of History:  Patient, Medical Record Summary  Records Review:   Previous Doctor Records, Previous Hospital Records, Previous Patient Records  Urine Test Review:   Urinalysis  X-Ray Review: C.T. Abdomen/Pelvis: Reviewed Films. Reviewed Report. Discussed With Patient.     PROCEDURES: None   ASSESSMENT:      ICD-10 Details  1 GU:   Hydronephrosis - N13.0   2   Urinary Tract Inf, Unspec site - N39.0   4   Malig Neo Urinary organ, Unspec - C68.9   3 NON-GU:   Solitary pulmonary nodule - R91.1    PLAN:           Orders X-Rays: C.T. Chest With I.V. Contrast          Schedule  Document Letter(s):  Created for Patient: Clinical Summary         Notes:   1. Right UPJ obstruction:  -I reviewed most recent CT A/P 02/12/2022 with resolved right hydronephrosis, new pyelonephritis, soft tissue enhancement at the right UPJ and unable to exclude urothelial carcinoma. I recommend cystoscopy, right retrograde pyelogram, diagnostic right ureteroscopy with possible biopsy, possible right ureteral stent placement. This will need to be done after she recovers from her pyelonephritis.  -He is continuing 10-day course of Bactrim presently. Surgery letter sent.  -If no suspicion for cancer, we will likely plan for Lasix renal scan.    #2. Internal left renal lesion: Will further evaluate with CT A/P with contrast. I did offer MRI however patient is claustrophobic and would prefer CT.  I reviewed CT and no mention of this lesion-was made on initial report. I contacted transfer radiology this morning and they will place addended. I will plan to notify patient once this has been addended.   #3. Urolithiasis: CT A/P 12/23 with small left renal stone. No need for intervention at this time. Will surveilled.   #4. Right pyelonephritis: Continue Bactrim. Return precautions discussed and if she develops fevers, chills, she would need to present to the emergency department for IV antibiotics.   #5. Bilobar lung nodules: Will obtain dedicated CT chest. Will notify results.   CC: Suzanna Obey, MD         Next Appointment:      Next Appointment: 02/18/2022 09:00 AM    Appointment Type: Office Visit Established Patient    Location: [facility] [phone]    Provider: Rexene Alberts, M.D.    Reason for Visit: 3 weeks office visit    Urology Preoperative H&P   Chief Complaint: Right UPJ obstruction  History of Present Illness: Melissa Leon is a 77 y.o. female with a right UPJ obstruction, possible lesion within the right renal pelvis here for cystoscopy, right RPG, R dx URS, possible biopsy, possible ablation, possible R PCN removal. Long discussion was held with patient regarding recent CT abdomen, chest scans and recent ED visit. At this time, she does not want to pursue a lasix renal scan and would like to have her nephrostomy tube internalized into a stent and proceed with serial stent exchanges if no evidence of cancer.   Past Medical History:  Diagnosis Date   Anemia    Arthritis    Depression    Diabetes mellitus ([place])    Hypertension    Pneumonia     Past Surgical History:  Procedure Laterality Date   APPENDECTOMY     CHOLECYSTECTOMY     IR NEPHROSTOMY PLACEMENT RIGHT  01/05/2022   UTERINE  FIBROID SURGERY     WRIST GANGLION EXCISION Right     Allergies:  Allergies  Allergen Reactions   Choline Fenofibrate Other (See Comments)    ELEVATED LFT(s)   Penicillins Hives   Ozempic (0.25 Or 0.5 Mg-Dose) [Semaglutide(0.25 Or 0.51m-Dos)] Nausea Only and Other (See Comments)    SEVERE NAUSEA    Family History  Problem Relation Age of Onset   Breast cancer Neg Hx     Social History:  reports that she has quit smoking. Her smoking use included cigarettes. She has a 0.50 pack-year smoking history. She has never used smokeless tobacco. She reports that she does not currently use alcohol. She reports that she does not use drugs.  ROS: A complete review of systems was performed.  All systems  are negative except for pertinent findings as noted.  Physical Exam:  Vital signs in last 24 hours: Temp:  [97.6 F (36.4 C)] 97.6 F (36.4 C) (02/09 0924) Pulse Rate:  [95] 95 (02/09 0924) Resp:  [16] 16 (02/09 0924) BP: (154)/(85) 154/85 (02/09 0924) SpO2:  [98 %] 98 % (02/09 0924) Weight:  [78.5 kg] 78.5 kg (02/09 0924) Constitutional:  Alert and oriented, No acute distress Cardiovascular: Regular rate and rhythm Respiratory: Normal respiratory effort, Lungs clear bilaterally GI: Abdomen is soft, nontender, nondistended, no abdominal masses GU: No CVA tenderness Lymphatic: No lymphadenopathy Neurologic: Grossly intact, no focal deficits Psychiatric: Normal mood and affect  Laboratory Data:  Recent Labs    02/25/22 0940  WBC 18.6*  HGB 12.6  HCT 39.6  PLT 552*    Recent Labs    02/25/22 0940  NA 134*  K 3.9  CL 98  GLUCOSE 187*  BUN 26*  CALCIUM 9.2  CREATININE 0.79     No results found for this or any previous visit (from the past 24 hour(s)). No results found for this or any previous visit (from the past 240 hour(s)).  Renal Function: Recent Labs    02/25/22 0940  CREATININE 0.79   Estimated Creatinine Clearance: 64.6 mL/min (by C-G formula based on SCr  of 0.79 mg/dL).  Radiologic Imaging: CT Renal Stone Study  Result Date: 02/25/2022 CLINICAL DATA:  Abdominal and flank pain, has cystoscopy with nephrostomy tube placement on RIGHT side for biopsy last week. Complaining of LEFT side back and flank pain making it difficult to walk EXAM: CT ABDOMEN AND PELVIS WITHOUT CONTRAST TECHNIQUE: Multidetector CT imaging of the abdomen and pelvis was performed following the standard protocol without IV contrast. RADIATION DOSE REDUCTION: This exam was performed according to the departmental dose-optimization program which includes automated exposure control, adjustment of the mA and/or kV according to patient size and/or use of iterative reconstruction technique. COMPARISON:  01/04/2022 FINDINGS: Lower chest: Small bibasilar pulmonary nodules, 3 mm LEFT lower lobe image 7, cavitary nodule RIGHT lower lobe 5 mm image 9, 4 mm RIGHT lower lobe image 18. Nodule versus atelectasis at base of RIGHT major fissure 10 mm image 24. These nodules are essentially unchanged. Hepatobiliary: Gallbladder surgically absent. Biliary dilatation similar to prior exam. No focal hepatic abnormality. Pancreas: Normal appearance Spleen: Normal appearance Adrenals/Urinary Tract: Adrenal glands normal appearance. Nephrostomy tube coiled in decompressed RIGHT renal pelvis. 11 mm LEFT renal cyst unchanged image 37. No definite urinary tract calcification or dilatation. Excreted contrast within bladder. Ureters unremarkable. Stomach/Bowel: Scattered colonic diverticulosis and stool. No CT evidence of diverticulitis. Stomach decompressed, with inadequate assessment of gastric wall thickness. Small bowel loops unremarkable. Duodenal diverticulum noted. Vascular/Lymphatic: Atherosclerotic calcifications aorta, iliac arteries, coronary arteries, femoral arteries. Aorta normal caliber. Few pelvic phleboliths. No adenopathy. Reproductive: Unremarkable uterus and ovaries Other: No free air or free fluid. No  hernia or inflammatory process. Musculoskeletal: Osseous demineralization. IMPRESSION: Nephrostomy tube coiled in decompressed RIGHT renal pelvis. Scattered colonic diverticulosis and stool without evidence of diverticulitis. Stable bibasilar pulmonary nodules. No acute intra-abdominal or intrapelvic abnormalities. Aortic Atherosclerosis (ICD10-I70.0). Electronically Signed   By: Lavonia Dana M.D.   On: 02/25/2022 11:07    I independently reviewed the above imaging studies.  Assessment and Plan Melissa Leon is a 77 y.o. female with right UPJ obstruction, possible right renal pelvis lesions here for cysto, R RPG, R URS, possible biopsy, possible ablation, possible right PCN removal with right stent placement.  -The risks,  benefits and alternatives of cystoscopy with right JJ stent placement was discussed with the patient.  Risks include, but are not limited to: bleeding, urinary tract infection, ureteral injury, ureteral stricture disease, chronic pain, urinary symptoms, bladder injury, stent migration, the need for nephrostomy tube placement, MI, CVA, DVT, PE and the inherent risks with general anesthesia.  The patient voices understanding and wishes to proceed.       Matt R. Rendi Mapel MD 02/27/2022, 10:28 AM  [facility] Pager: [phone]): [phone]

## 2022-02-27 NOTE — Anesthesia Preprocedure Evaluation (Signed)
Anesthesia Evaluation  Patient identified by MRN, date of birth, ID band Patient awake    Reviewed: Allergy & Precautions, H&P , NPO status , Patient's Chart, lab work & pertinent test results  Airway Mallampati: II   Neck ROM: full    Dental   Pulmonary former smoker   breath sounds clear to auscultation       Cardiovascular hypertension,  Rhythm:regular Rate:Normal     Neuro/Psych  PSYCHIATRIC DISORDERS  Depression       GI/Hepatic   Endo/Other  diabetes, Type 2    Renal/GU      Musculoskeletal  (+) Arthritis ,    Abdominal   Peds  Hematology   Anesthesia Other Findings   Reproductive/Obstetrics                             Anesthesia Physical Anesthesia Plan  ASA: 2  Anesthesia Plan: General   Post-op Pain Management:    Induction: Intravenous  PONV Risk Score and Plan: 3 and Ondansetron, Dexamethasone and Treatment may vary due to age or medical condition  Airway Management Planned: LMA  Additional Equipment:   Intra-op Plan:   Post-operative Plan: Extubation in OR  Informed Consent: I have reviewed the patients History and Physical, chart, labs and discussed the procedure including the risks, benefits and alternatives for the proposed anesthesia with the patient or authorized representative who has indicated his/her understanding and acceptance.     Dental advisory given  Plan Discussed with: CRNA, Anesthesiologist and Surgeon  Anesthesia Plan Comments:        Anesthesia Quick Evaluation

## 2022-02-27 NOTE — Anesthesia Procedure Notes (Signed)
Procedure Name: LMA Insertion Date/Time: 02/27/2022 11:42 AM  Performed by: Genelle Bal, CRNAPre-anesthesia Checklist: Patient identified, Emergency Drugs available, Suction available and Patient being monitored Patient Re-evaluated:Patient Re-evaluated prior to induction Oxygen Delivery Method: Circle system utilized Preoxygenation: Pre-oxygenation with 100% oxygen Induction Type: IV induction Ventilation: Mask ventilation without difficulty LMA: LMA inserted LMA Size: 4.0 Number of attempts: 1 Airway Equipment and Method: Bite block Placement Confirmation: positive ETCO2 Tube secured with: Tape Dental Injury: Teeth and Oropharynx as per pre-operative assessment

## 2022-02-27 NOTE — ED Provider Notes (Signed)
[facility] Provider Note   CSN: [id number] Arrival date & time: 02/27/22  2008     History {Add pertinent medical, surgical, social history, OB history to HPI:1} No chief complaint on file.   Melissa Leon is a 77 y.o. female.  HPI   77 year old female presents emergency department with worsening lower back pain and pain/weakness in the bilateral lower extremities.  Recently was admitted 2 months ago for UTI with obstruction requiring nephrostomy tube on the right which was removed today.  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  She typically is independent and walks on her own.  Patient was seen 2 days ago with similar complaints, imaging and labs at that time was appropriate for discharge home and oral pain medicine.  The pain down the legs has been worsening with associated weakness, she started using a walker at home.  However patient's symptoms have become so severe that she is no longer able to stand or pivot on her own.  She complains of pins and needle sensation in her bilateral feet.  Pain is not controlled with oral medicine.  Denies any abdominal pain or discoloration of the feet.  Home Medications Prior to Admission medications   Medication Sig Start Date End Date Taking? Authorizing Provider  acetaminophen (TYLENOL) 650 MG CR tablet Take 650 mg by mouth every 8 (eight) hours as needed for pain.    [provider]  amLODipine (NORVASC) 5 MG tablet Take 5 mg by mouth at bedtime. 03/27/21   [provider]  aspirin 81 MG chewable tablet Chew 81 mg by mouth at bedtime.    [provider]  atorvastatin (LIPITOR) 10 MG tablet Take 10 mg by mouth at bedtime. 04/29/21   [provider]  Camphor-Menthol-Methyl Sal (SALONPAS) 3.01-25-08 % PTCH Place 1-2 patches onto the skin daily as needed  (back pain).    [provider]  diclofenac Sodium (VOLTAREN) 1 % GEL Apply 4 g topically 4 (four) times daily. 02/25/22   Deno Etienne, DO  docusate sodium (COLACE) 100 MG capsule Take 1 capsule (100 mg total) by mouth daily as needed for up to 30 doses. 02/27/22   Janith Lima, MD  glimepiride (AMARYL) 4 MG tablet Take 4 mg by mouth daily. 01/28/22   [provider]  hydrochlorothiazide (HYDRODIURIL) 25 MG tablet Take 25 mg by mouth every morning. 01/15/22   [provider]  losartan (COZAAR) 100 MG tablet Take 100 mg by mouth in the morning. 04/21/21   [provider]  metFORMIN (GLUCOPHAGE-XR) 500 MG 24 hr tablet Take 1,000 mg by mouth 2 (two) times daily with a meal. 03/27/21   [provider]  methylPREDNISolone (MEDROL DOSEPAK) 4 MG TBPK tablet Take by mouth as directed. 02/18/22   [provider]  morphine (MSIR) 15 MG tablet Take 0.5 tablets (7.5 mg total) by mouth every 4 (four) hours as needed for severe pain. 02/25/22   Deno Etienne, DO  ondansetron (ZOFRAN-ODT) 4 MG disintegrating tablet Take 1 tablet (4 mg total) by mouth every 8 (eight) hours as needed for nausea or vomiting. 01/03/22   Leonel Mccollum, Barbette Hair, MD  OVER THE COUNTER MEDICATION Apply 1 application  topically daily as needed (pain). CBD Serum    [provider]  oxyCODONE-acetaminophen (PERCOCET) 5-325 MG tablet Take 1 tablet by mouth  every 4 (four) hours as needed for up to 18 doses for severe pain. 02/27/22   Janith Lima, MD  potassium chloride SA (KLOR-CON M) 20 MEQ tablet Take 1 tablet (20 mEq total) by mouth daily for 5 days. 01/09/22 01/14/22  Antonieta Pert, MD  sulfamethoxazole-trimethoprim (BACTRIM DS) 800-160 MG tablet Take 1 tablet by mouth 2 (two) times daily. 02/12/22   [provider]      Allergies    Choline fenofibrate, Penicillins, and Ozempic (0.25 or 0.5 mg-dose) [semaglutide(0.25 or 0.52m-dos)]    Review of Systems   Review of Systems   Constitutional:  Negative for fever.  Respiratory:  Negative for shortness of breath.   Cardiovascular:  Negative for chest pain.  Gastrointestinal:  Negative for abdominal pain, diarrhea and vomiting.  Musculoskeletal:  Positive for back pain.       Bilateral lower extremity pain and weakness  Skin:  Negative for rash.  Neurological:  Positive for weakness. Negative for headaches.    Physical Exam Updated Vital Signs BP (!) 142/73   Pulse 87   Temp 98 F (36.7 C)   Resp 18   Ht 5' 7"$  (1.702 m)   Wt 79.4 kg   SpO2 95%   BMI 27.41 kg/m  Physical Exam Vitals and nursing note reviewed.  Constitutional:      Appearance: Normal appearance.  HENT:     Head: Normocephalic.     Mouth/Throat:     Mouth: Mucous membranes are moist.  Cardiovascular:     Rate and Rhythm: Normal rate.  Pulmonary:     Effort: Pulmonary effort is normal. No respiratory distress.  Abdominal:     Palpations: Abdomen is soft.     Tenderness: There is no abdominal tenderness.  Musculoskeletal:     Comments: Bilateral feet are cool to the touch, equal palpable DP and TP pulses, comparable capillary refill bilaterally.  Equal strength with dorsal and plantarflexion of the feet.  Equal 2/5 weakness in the bilateral lower legs with lifting, mostly secondary to pain, no babinski  Skin:    General: Skin is warm.  Neurological:     Mental Status: She is alert and oriented to person, place, and time. Mental status is at baseline.  Psychiatric:        Mood and Affect: Mood normal.     ED Results / Procedures / Treatments   Labs (all labs ordered are listed, but only abnormal results are displayed) Labs Reviewed  COMPREHENSIVE METABOLIC PANEL - Abnormal; Notable for the following components:      Result Value   Sodium 130 (*)    Glucose, Bld 284 (*)    BUN 24 (*)    Albumin 2.8 (*)    ALT 108 (*)    Alkaline Phosphatase 201 (*)    All other components within normal limits  CBC WITH  DIFFERENTIAL/PLATELET - Abnormal; Notable for the following components:   WBC 18.6 (*)    RBC 3.56 (*)    Hemoglobin 10.8 (*)    HCT 32.5 (*)    Neutro Abs 17.8 (*)    Lymphs Abs 0.3 (*)    Abs Immature Granulocytes 0.10 (*)    All other components within normal limits  CBC WITH DIFFERENTIAL/PLATELET    EKG None  Radiology DG C-Arm 1-60 Min-No Report  Result Date: 02/27/2022 Fluoroscopy was utilized by the requesting physician.  No radiographic interpretation.    Procedures Procedures  {Document cardiac monitor, telemetry assessment procedure when appropriate:1}  Medications Ordered in ED Medications  sodium chloride (PF) 0.9 % injection (has no administration in time range)  morphine (PF) 4 MG/ML injection 4 mg (4 mg Intravenous Given 02/27/22 2155)  sodium chloride 0.9 % bolus 500 mL (0 mLs Intravenous Stopped 02/27/22 2259)  ondansetron (ZOFRAN) injection 4 mg (4 mg Intravenous Given 02/27/22 2155)  iohexol (OMNIPAQUE) 300 MG/ML solution 100 mL (100 mLs Intravenous Contrast Given 02/27/22 2303)    ED Course/ Medical Decision Making/ A&P   {   Click here for ABCD2, HEART and other calculatorsREFRESH Note before signing :1}                          Medical Decision Making Amount and/or Complexity of Data Reviewed Labs: ordered. Radiology: ordered.  Risk Prescription drug management.   ***  {Document critical care time when appropriate:1} {Document review of labs and clinical decision tools ie heart score, Chads2Vasc2 etc:1}  {Document your independent review of radiology images, and any outside records:1} {Document your discussion with family members, caretakers, and with consultants:1} {Document social determinants of health affecting pt's care:1} {Document your decision making why or why not admission, treatments were needed:1} Final Clinical Impression(s) / ED Diagnoses Final diagnoses:  None    Rx / DC Orders ED Discharge Orders     None

## 2022-02-27 NOTE — Transfer of Care (Signed)
Immediate Anesthesia Transfer of Care Note  Patient: Melissa Leon  Procedure(s) Performed: TURBT, CYSTOSCOPY WITH RIGHT RETROGRADE PYELOGRAM, RIGHT URETEROSCOPY, BIOPSY AND STENT PLACEMENT (Right)  Patient Location: PACU  Anesthesia Type:General  Level of Consciousness: awake, alert , and oriented  Airway & Oxygen Therapy: Patient Spontanous Breathing and Patient connected to face mask oxygen  Post-op Assessment: Report given to RN  Post vital signs: Reviewed and stable  Last Vitals:  Vitals Value Taken Time  BP 178/97 02/27/22 1315  Temp 36.4 C 02/27/22 1241  Pulse 88 02/27/22 1316  Resp 16 02/27/22 1245  SpO2 97 % 02/27/22 1316  Vitals shown include unvalidated device data.  Last Pain:  Vitals:   02/27/22 1245  TempSrc:   PainSc: 5          Complications: No notable events documented.

## 2022-02-28 ENCOUNTER — Encounter (HOSPITAL_COMMUNITY): Payer: Self-pay | Admitting: Internal Medicine

## 2022-02-28 ENCOUNTER — Inpatient Hospital Stay (HOSPITAL_COMMUNITY): Payer: Medicare HMO

## 2022-02-28 DIAGNOSIS — Y92239 Unspecified place in hospital as the place of occurrence of the external cause: Secondary | ICD-10-CM | POA: Diagnosis not present

## 2022-02-28 DIAGNOSIS — T40605A Adverse effect of unspecified narcotics, initial encounter: Secondary | ICD-10-CM | POA: Diagnosis not present

## 2022-02-28 DIAGNOSIS — G8929 Other chronic pain: Secondary | ICD-10-CM | POA: Diagnosis present

## 2022-02-28 DIAGNOSIS — Z880 Allergy status to penicillin: Secondary | ICD-10-CM | POA: Diagnosis not present

## 2022-02-28 DIAGNOSIS — Z7982 Long term (current) use of aspirin: Secondary | ICD-10-CM | POA: Diagnosis not present

## 2022-02-28 DIAGNOSIS — N135 Crossing vessel and stricture of ureter without hydronephrosis: Secondary | ICD-10-CM | POA: Diagnosis present

## 2022-02-28 DIAGNOSIS — G061 Intraspinal abscess and granuloma: Secondary | ICD-10-CM | POA: Diagnosis present

## 2022-02-28 DIAGNOSIS — N136 Pyonephrosis: Secondary | ICD-10-CM | POA: Diagnosis present

## 2022-02-28 DIAGNOSIS — E119 Type 2 diabetes mellitus without complications: Secondary | ICD-10-CM | POA: Diagnosis not present

## 2022-02-28 DIAGNOSIS — I10 Essential (primary) hypertension: Secondary | ICD-10-CM | POA: Diagnosis present

## 2022-02-28 DIAGNOSIS — E1165 Type 2 diabetes mellitus with hyperglycemia: Secondary | ICD-10-CM | POA: Diagnosis not present

## 2022-02-28 DIAGNOSIS — M48062 Spinal stenosis, lumbar region with neurogenic claudication: Secondary | ICD-10-CM | POA: Diagnosis present

## 2022-02-28 DIAGNOSIS — T380X5A Adverse effect of glucocorticoids and synthetic analogues, initial encounter: Secondary | ICD-10-CM | POA: Diagnosis not present

## 2022-02-28 DIAGNOSIS — M4656 Other infective spondylopathies, lumbar region: Secondary | ICD-10-CM | POA: Diagnosis present

## 2022-02-28 DIAGNOSIS — M4620 Osteomyelitis of vertebra, site unspecified: Secondary | ICD-10-CM | POA: Diagnosis not present

## 2022-02-28 DIAGNOSIS — B9562 Methicillin resistant Staphylococcus aureus infection as the cause of diseases classified elsewhere: Secondary | ICD-10-CM | POA: Diagnosis present

## 2022-02-28 DIAGNOSIS — N131 Hydronephrosis with ureteral stricture, not elsewhere classified: Secondary | ICD-10-CM | POA: Diagnosis present

## 2022-02-28 DIAGNOSIS — Z87891 Personal history of nicotine dependence: Secondary | ICD-10-CM | POA: Diagnosis not present

## 2022-02-28 DIAGNOSIS — R202 Paresthesia of skin: Secondary | ICD-10-CM | POA: Diagnosis present

## 2022-02-28 DIAGNOSIS — Z9049 Acquired absence of other specified parts of digestive tract: Secondary | ICD-10-CM | POA: Diagnosis not present

## 2022-02-28 DIAGNOSIS — K5903 Drug induced constipation: Secondary | ICD-10-CM | POA: Diagnosis not present

## 2022-02-28 DIAGNOSIS — D72823 Leukemoid reaction: Secondary | ICD-10-CM | POA: Diagnosis not present

## 2022-02-28 DIAGNOSIS — R200 Anesthesia of skin: Secondary | ICD-10-CM | POA: Diagnosis present

## 2022-02-28 DIAGNOSIS — Z7984 Long term (current) use of oral hypoglycemic drugs: Secondary | ICD-10-CM | POA: Diagnosis not present

## 2022-02-28 DIAGNOSIS — Z888 Allergy status to other drugs, medicaments and biological substances status: Secondary | ICD-10-CM | POA: Diagnosis not present

## 2022-02-28 DIAGNOSIS — R918 Other nonspecific abnormal finding of lung field: Secondary | ICD-10-CM | POA: Diagnosis present

## 2022-02-28 DIAGNOSIS — Z79899 Other long term (current) drug therapy: Secondary | ICD-10-CM | POA: Diagnosis not present

## 2022-02-28 DIAGNOSIS — G062 Extradural and subdural abscess, unspecified: Secondary | ICD-10-CM | POA: Diagnosis not present

## 2022-02-28 LAB — GLUCOSE, CAPILLARY
Glucose-Capillary: 196 mg/dL — ABNORMAL HIGH (ref 70–99)
Glucose-Capillary: 193 mg/dL — ABNORMAL HIGH (ref 70–99)
Glucose-Capillary: 156 mg/dL — ABNORMAL HIGH (ref 70–99)
Glucose-Capillary: 270 mg/dL — ABNORMAL HIGH (ref 70–99)

## 2022-02-28 LAB — C-REACTIVE PROTEIN: CRP: 14.3 mg/dL — ABNORMAL HIGH (ref <1.0)

## 2022-02-28 LAB — SEDIMENTATION RATE: Sed Rate: 124 mm/hr — ABNORMAL HIGH (ref 0–22)

## 2022-02-28 LAB — CBG MONITORING, ED: Glucose-Capillary: 217 mg/dL — ABNORMAL HIGH (ref 70–99)

## 2022-02-28 MED ORDER — INSULIN ASPART 100 UNIT/ML IJ SOLN
0.0000 [IU] | INTRAMUSCULAR | Status: DC
  Administered 2022-02-28 (×2): 4 [IU] via SUBCUTANEOUS
  Administered 2022-02-28: 7 [IU] via SUBCUTANEOUS
  Administered 2022-02-28: 4 [IU] via SUBCUTANEOUS
  Administered 2022-02-28: 11 [IU] via SUBCUTANEOUS
  Administered 2022-03-01: 4 [IU] via SUBCUTANEOUS
  Administered 2022-03-01: 7 [IU] via SUBCUTANEOUS
  Filled 2022-02-28: qty 0.2

## 2022-02-28 MED ORDER — KETOROLAC TROMETHAMINE 15 MG/ML IJ SOLN
15.0000 mg | Freq: Four times a day (QID) | INTRAMUSCULAR | Status: DC
  Administered 2022-02-28 – 2022-03-01 (×5): 15 mg via INTRAVENOUS
  Filled 2022-02-28 (×5): qty 1

## 2022-02-28 MED ORDER — METFORMIN HCL ER 500 MG PO TB24
1000.0000 mg | ORAL_TABLET | Freq: Two times a day (BID) | ORAL | Status: DC
  Administered 2022-03-02 – 2022-03-06 (×9): 1000 mg via ORAL
  Filled 2022-02-28 (×10): qty 2

## 2022-02-28 MED ORDER — ATORVASTATIN CALCIUM 10 MG PO TABS
10.0000 mg | ORAL_TABLET | Freq: Every day | ORAL | Status: DC
  Filled 2022-02-28: qty 1

## 2022-02-28 MED ORDER — PREDNISONE 20 MG PO TABS
20.0000 mg | ORAL_TABLET | Freq: Two times a day (BID) | ORAL | Status: DC
  Administered 2022-02-28 – 2022-03-02 (×4): 20 mg via ORAL
  Filled 2022-02-28 (×4): qty 1

## 2022-02-28 MED ORDER — HEPARIN SODIUM (PORCINE) 5000 UNIT/ML IJ SOLN
5000.0000 [IU] | Freq: Three times a day (TID) | INTRAMUSCULAR | Status: AC
  Administered 2022-02-28 (×3): 5000 [IU] via SUBCUTANEOUS
  Filled 2022-02-28 (×3): qty 1

## 2022-02-28 MED ORDER — METHYLPREDNISOLONE 4 MG PO TBPK: ORAL_TABLET | ORAL | Status: DC

## 2022-02-28 MED ORDER — ACETAMINOPHEN 325 MG PO TABS
650.0000 mg | ORAL_TABLET | Freq: Four times a day (QID) | ORAL | Status: DC
  Administered 2022-02-28 (×4): 650 mg via ORAL
  Filled 2022-02-28 (×4): qty 2

## 2022-02-28 MED ORDER — GADOBUTROL 1 MMOL/ML IV SOLN
7.5000 mL | Freq: Once | INTRAVENOUS | Status: AC | PRN
  Administered 2022-02-28: 7.5 mL via INTRAVENOUS

## 2022-02-28 MED ORDER — VANCOMYCIN HCL 1500 MG/300ML IV SOLN
1500.0000 mg | Freq: Once | INTRAVENOUS | Status: DC
  Administered 2022-02-28: 1500 mg via INTRAVENOUS
  Filled 2022-02-28: qty 300

## 2022-02-28 MED ORDER — SODIUM CHLORIDE 0.9 % IV SOLN
1.0000 g | INTRAVENOUS | Status: DC
  Administered 2022-02-28 – 2022-03-01 (×2): 1 g via INTRAVENOUS
  Filled 2022-02-28: qty 10

## 2022-02-28 MED ORDER — SODIUM CHLORIDE 0.9 % IV SOLN
1.0000 g | Freq: Three times a day (TID) | INTRAVENOUS | Status: DC
  Administered 2022-02-28: 1 g via INTRAVENOUS
  Filled 2022-02-28 (×2): qty 20

## 2022-02-28 MED ORDER — SULFAMETHOXAZOLE-TRIMETHOPRIM 800-160 MG PO TABS
1.0000 | ORAL_TABLET | Freq: Two times a day (BID) | ORAL | Status: DC
  Administered 2022-02-28 (×2): 1 via ORAL
  Filled 2022-02-28 (×4): qty 1

## 2022-02-28 MED ORDER — AMLODIPINE BESYLATE 5 MG PO TABS
5.0000 mg | ORAL_TABLET | Freq: Every day | ORAL | Status: DC
  Administered 2022-02-28 – 2022-03-05 (×6): 5 mg via ORAL
  Filled 2022-02-28 (×6): qty 1

## 2022-02-28 MED ORDER — ORAL CARE MOUTH RINSE: 15.0000 mL | OROMUCOSAL | Status: DC | PRN

## 2022-02-28 MED ORDER — LORAZEPAM 1 MG PO TABS
1.0000 mg | ORAL_TABLET | Freq: Once | ORAL | Status: AC
  Administered 2022-02-28: 1 mg via ORAL
  Filled 2022-02-28: qty 1

## 2022-02-28 MED ORDER — VANCOMYCIN HCL 1500 MG/300ML IV SOLN: 1500.0000 mg | INTRAVENOUS | Status: DC

## 2022-02-28 MED ORDER — DOCUSATE SODIUM 100 MG PO CAPS
100.0000 mg | ORAL_CAPSULE | Freq: Every day | ORAL | Status: DC
  Administered 2022-02-28 – 2022-03-03 (×4): 100 mg via ORAL
  Filled 2022-02-28 (×5): qty 1

## 2022-02-28 MED ORDER — GLIMEPIRIDE 4 MG PO TABS
4.0000 mg | ORAL_TABLET | Freq: Every day | ORAL | Status: DC
  Administered 2022-02-28 – 2022-03-06 (×7): 4 mg via ORAL
  Filled 2022-02-28 (×8): qty 1

## 2022-02-28 MED ORDER — MORPHINE SULFATE 15 MG PO TABS: 7.5000 mg | ORAL_TABLET | ORAL | Status: DC | PRN

## 2022-02-28 MED ORDER — SODIUM CHLORIDE 0.45 % IV SOLN: INTRAVENOUS | Status: DC

## 2022-02-28 NOTE — Assessment & Plan Note (Signed)
Patient with ureteral stent placement today. No complications at procedure  Plan Notify Dr. Loletha Carrow of patient's admission

## 2022-02-28 NOTE — Subjective & Objective (Signed)
Melissa Leon, a 77 y/o with recent h/x right ureteral obstruction with hydronephrosis since December '23. She has had a stormy course: had a pyelonephritis treated with cipro then recurrent symptoms and pyelonephritis with MRSA treated with bactrim. She required a percutaneous nephrostomy tube right. When her infection cleared she was able to have cystoscopy, ureteroscopy and ureteral stent placement right on 02/27/22.  Patient has a h/o DDD lumbar spine and previous episodes of sciatica on the left. Over the past 4 days she has had progressive back and leg pain for which she was seen 02/25/22 but was able to be discharged home from the ED. Since that time her pain has been worse, she has had increasing difficulty walking and as of today cannot walk. She has also had difficulty with micturition. She presents to [facility] for evaluation.

## 2022-02-28 NOTE — ED Notes (Signed)
[facility] called for transport 

## 2022-02-28 NOTE — Progress Notes (Signed)
0738: Called MRI to get ETA on pt going for STAT scan. Pt endorsing hx claustrophobia; premeds requested  [id number]: MRI department called again for update; rad tech states they will call when ready for premed admin and sending for pt

## 2022-02-28 NOTE — Plan of Care (Signed)

## 2022-02-28 NOTE — Progress Notes (Signed)
Arrived per [facility] from [facility] ED

## 2022-02-28 NOTE — Progress Notes (Signed)
Pharmacy Antibiotic Note  Angelisa V Kobler is a 77 y.o. female admitted on 02/27/2022 with difficulty with micturition, pt had cystoscopy, ureteroscopy and ureteral stent placement right on 02/27/22. Marland Kitchen  Pharmacy has been consulted for vancomycin and merrem dosing.  Plan: Vancomycin 157m IV x 1 then [id number] (AUC 509.4, Scr 0.82) Merrem 1gm IV q8h Follow renal function, cultures and clinical course  Height: 5' 7"$  (170.2 cm) Weight: 79.4 kg (175 lb) IBW/kg (Calculated) : 61.6  Temp (24hrs), Avg:97.7 F (36.5 C), Min:97.6 F (36.4 C), Max:98 F (36.7 C)  Recent Labs  Lab 02/23/22 1424 02/25/22 0940 02/27/22 2148 02/27/22 2206  WBC 18.9* 18.6*  --  18.6*  CREATININE  --  0.79 0.82  --     Estimated Creatinine Clearance: 63.3 mL/min (by C-G formula based on SCr of 0.82 mg/dL).    Allergies  Allergen Reactions   Choline Fenofibrate Other (See Comments)    ELEVATED LFT(s)   Penicillins Hives   Ozempic (0.25 Or 0.5 Mg-Dose) [Semaglutide(0.25 Or 0.46m3mos)] Nausea Only and Other (See Comments)    SEVERE NAUSEA    Antimicrobials this admission: 2/10 vanc >> 2/10 merrem >>  Dose adjustments this admission:   Microbiology results:  Thank you for allowing pharmacy to be a part of this patient's care.  EllDolly Riash 02/28/2022, 3:29 AM

## 2022-02-28 NOTE — Plan of Care (Signed)
Adm from [facility] ED for Lumbar Spinal Stenosis. Evaluated shortly after adm by Neuro Surgery. Pt anxious about the MRI r/t being claustrophobic. No c/o pain unless being moved. Refused to be turned

## 2022-02-28 NOTE — Assessment & Plan Note (Signed)
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  Plan Admit to [facility] at request of NS consultant  Prednisone 20 mg bid  Ketorolac for pain

## 2022-02-28 NOTE — Progress Notes (Signed)
Patient ID: Melissa Leon, female   DOB: 1946-01-03, 77 y.o.   MRN: [id number] We spoke with this patient and examined her about 530 this morning.  We were awaiting the MRI to leave our full consult note, but despite the fact that it is ordered stat it has not been done at this point.  Briefly, she has had a fairly complicated recent history with hydronephrosis and pyelonephritis with MRSA treated with antibiotics.  CT scan shows chronic degenerative changes of the lumbar spine but no obvious acute change such as obvious destruction or osteolytic lesions to suggest infection, however, given her clinical course certainly infection of the lumbar spine is a concern.  Therefore, we eagerly await the MRI of the lumbar spine to be done with and without contrast.  A full consult note will be left after that.

## 2022-02-28 NOTE — Consult Note (Signed)
Reason for Consult: lower extremity weakness Referring Physician: [facility]   Melissa Leon is an 77 y.o. female.   HPI:  77 year old female presented to [facility]. Yesterday she had a uretal obstruction and pyelonephritis which was being treated with cipro. A nephrostomy tube was placed on 2/9. She has had progressive lower extremity weakness with lower back pain and bilateral leg pain with numbness and tingling. MRI thoracic spine was performed and she was transferred to [facility]. She also has urinary retention and a foley catheter was placed which drained 800cc at time of placement.    Past Medical History:  Diagnosis Date   Anemia    Arthritis    Depression    Diabetes mellitus ([place])    Hypertension    Pneumonia    Spinal stenosis     Past Surgical History:  Procedure Laterality Date   APPENDECTOMY     CHOLECYSTECTOMY     CYSTOSCOPY WITH RETROGRADE PYELOGRAM, URETEROSCOPY AND STENT PLACEMENT Right 02/27/2022   Procedure: TURBT, CYSTOSCOPY WITH RIGHT RETROGRADE PYELOGRAM, RIGHT URETEROSCOPY, BIOPSY AND STENT PLACEMENT;  Surgeon: Janith Lima, MD;  Location: [facility];  Service: Urology;  Laterality: Right;  30 MINUTES NEEDED FOR CASE   IR NEPHROSTOMY PLACEMENT RIGHT  01/05/2022   UTERINE FIBROID SURGERY     WRIST GANGLION EXCISION Right     Allergies  Allergen Reactions   Choline Fenofibrate Other (See Comments)    ELEVATED LFT(s)   Penicillins Hives   Ozempic (0.25 Or 0.5 Mg-Dose) [Semaglutide(0.25 Or 0.40m-Dos)] Nausea Only and Other (See Comments)    SEVERE NAUSEA   Statins     Aches, pain    Social History   Tobacco Use   Smoking status: Former    Packs/day: 0.25    Years: 2.00    Total pack years: 0.50    Types: Cigarettes   Smokeless tobacco: Never  Substance Use Topics   Alcohol use: Not Currently    Family History  Problem Relation Age of Onset   Breast cancer Neg Hx      Review of Systems  Positive ROS: as above  All other systems have been reviewed  and were otherwise negative with the exception of those mentioned in the HPI and as above.  Objective: Vital signs in last 24 hours: Temp:  [97.8 F (36.6 C)-98.5 F (36.9 C)] 97.8 F (36.6 C) (02/10 2007) Pulse Rate:  [71-105] 71 (02/10 2007) Resp:  [15-18] 16 (02/10 2007) BP: (130-167)/(62-96) 153/70 (02/10 2007) SpO2:  [94 %-99 %] 96 % (02/10 2007)  General Appearance: Alert, cooperative, no distress, appears stated age Head: Normocephalic, without obvious abnormality, atraumatic Eyes: PERRL, conjunctiva/corneas clear, EOM's intact, fundi benign, both eyes      Lungs:  respirations unlabored Heart: Regular rate and rhythm Extremities: Extremities normal, atraumatic, no cyanosis or edema Pulses: 2+ and symmetric all extremities Skin: Skin color, texture, turgor normal, no rashes or lesions  NEUROLOGIC:   Mental status: A&O x4, no aphasia, good attention span, Memory and fund of knowledge Motor Exam - Lower extremity weakness: LLE 4-/5 throughout and RLL 4/5 throughout Sensory Exam - grossly normal Reflexes: symmetric, no pathologic reflexes, No Hoffman's, No clonus Coordination - not tested Gait - not tested Balance - not tested Cranial Nerves: I: smell Not tested  II: visual acuity  OS: na    OD: na  II: visual fields Full to confrontation  II: pupils Equal, round, reactive to light  III,VII: ptosis None  III,IV,VI: extraocular  muscles  Full ROM  V: mastication   V: facial light touch sensation    V,VII: corneal reflex    VII: facial muscle function - upper    VII: facial muscle function - lower   VIII: hearing   IX: soft palate elevation    IX,X: gag reflex   XI: trapezius strength    XI: sternocleidomastoid strength   XI: neck flexion strength    XII: tongue strength      Data Review Lab Results  Component Value Date   WBC 18.6 (H) 02/27/2022   HGB 10.8 (L) 02/27/2022   HCT 32.5 (L) 02/27/2022   MCV 91.3 02/27/2022   PLT 355 02/27/2022   Lab Results   Component Value Date   NA 130 (L) 02/27/2022   K 4.7 02/27/2022   CL 100 02/27/2022   CO2 23 02/27/2022   BUN 24 (H) 02/27/2022   CREATININE 0.82 02/27/2022   GLUCOSE 284 (H) 02/27/2022   Lab Results  Component Value Date   INR 1.2 01/05/2022    Radiology: MR Lumbar Spine W Wo Contrast  Result Date: 02/28/2022 CLINICAL DATA:  Spinal stenosis, lumbar neurogenic claudication. Leukocytosis. EXAM: MRI LUMBAR SPINE WITHOUT AND WITH CONTRAST TECHNIQUE: Multiplanar and multiecho pulse sequences of the lumbar spine were obtained without and with intravenous contrast. CONTRAST:  7.12m GADAVIST GADOBUTROL 1 MMOL/ML IV SOLN COMPARISON:  CT 02/27/2022 FINDINGS: Segmentation: Transitional lumbosacral anatomy with partial sacralization of the L5 segment. Incompletely developed disc space at the L5-S1 level. Lowest well developed disc space is labeled as L4-L5. The lowest rib-bearing segment is labeled as T12 (corresponding to axial series 6, image 6). Alignment: Grade 1 anterolisthesis of L3 on L4. Minimal anterolisthesis of L4 on L5. Vertebrae: No acute fracture. No evidence of discitis. Bilateral facet joint effusions at L3-L4 concerning for septic arthritis, particularly on the left (series 4, image 11). Mild subchondral marrow edema which may be reactive. Early changes of osteomyelitis are not excluded. Benign intraosseous hemangiomas most notably within the L4 and L1 vertebral bodies. No suspicious marrow replacing bone lesion. Conus medullaris and cauda equina: Conus extends to the T12-L1 level. Conus and cauda equina appear normal. Rim enhancing fluid collection within the posterior epidural space spanning from the level of the mid L3 vertebral body to the L5-S1 disc space (series 8, image 9). Approximate dimensions of 1.1 x 0.9 cm transaxially and extending craniocaudally 7.8 cm. Paraspinal and other soft tissues: Lower posterior paraspinal muscle edema. There is a small abscess within the soft tissues  adjacent to the L4 spinous process on the left, which appears to be arising from the inferior margin of the left L3-L4 facet joint measuring approximately 2.4 x 1.9 x 2.1 cm (series 8, image 11). Fluid collection versus complex synovial cyst posterior to the right L3-L4 facet joint measuring 1.8 x 1.1 x 1.1 cm (series 6, image 31). No inflammatory changes or fluid collections within the psoas musculature. Disc levels: T12-L1: Mild facet arthropathy.  No foraminal or canal stenosis. L1-L2: Mild facet arthropathy.  No foraminal or canal stenosis. L2-L3: Mild annular disc bulge with mild-to-moderate bilateral facet arthropathy and ligamentum flavum buckling. Mild-to-moderate canal stenosis. No significant foraminal stenosis. L3-L4: Anterolisthesis with disc uncovering and diffuse disc bulge. Severe bilateral facet arthropathy and ligamentum flavum buckling. Posterior epidural space collection. Findings contribute to severe canal stenosis with severe bilateral subarticular recess stenosis and moderate bilateral foraminal stenosis. L4-L5: Broad right foraminal to far lateral disc protrusion. Severe bilateral facet arthropathy with ligamentum  flavum buckling. Fluid collection in the posterior epidural space. Findings contribute to moderate canal stenosis with right worse than left subarticular recess stenosis. Moderate right and mild left foraminal stenosis. L5-S1: Transitional level.  No impingement. IMPRESSION: 1. 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 2. Bilateral facet joint effusions at L3-L4 concerning for septic arthritis, particularly on the left. Mild subchondral marrow edema which may be reactive. Early changes of osteomyelitis are not excluded. 3. 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 Urgent Neurosurgical evaluation recommended. 4. Small abscess within the posterior paraspinal soft tissues likely arising from the  left L3-L4 facet joint measuring approximately 2.4 x 1.9 x 2.1 cm. 5. Fluid collection versus complex synovial cyst posterior to the right L3-L4 facet joint measuring 1.8 x 1.1 x 1.1 cm. 6. Severe canal stenosis with severe bilateral subarticular recess stenosis and moderate bilateral foraminal stenosis at L3-L4. Canal stenosis is in part secondary to the epidural abscess at this level. 7. Moderate canal stenosis at L4-L5 with moderate right and mild left foraminal stenosis. 8. Mild-to-moderate canal stenosis at L2-L3. 9. No findings to suggest discitis at any level within the lumbar spine. These results were called by telephone at the time of interpretation on 02/28/2022 at 5:08 pm to provider Little Ishikawa, MD, who verbally acknowledged these results. Per Dr. Avon Gully, Neurosurgery is also aware of the above findings. Electronically Signed   By: Davina Poke D.O.   On: 02/28/2022 17:10   CT LUMBAR SPINE W CONTRAST  Result Date: 02/27/2022 CLINICAL DATA:  Low back pain EXAM: CT LUMBAR SPINE WITH CONTRAST TECHNIQUE: Multidetector CT imaging of the lumbar spine was performed with intravenous contrast administration. RADIATION DOSE REDUCTION: This exam was performed according to the departmental dose-optimization program which includes automated exposure control, adjustment of the mA and/or kV according to patient size and/or use of iterative reconstruction technique. CONTRAST:  117m OMNIPAQUE IOHEXOL 300 MG/ML  SOLN COMPARISON:  Renal stone CT 12/26/2022. FINDINGS: Segmentation: 5 lumbar type vertebrae. Alignment: There is 2 mm of anterolisthesis at L3-L4. Alignment is otherwise anatomic. Vertebrae: Bones are osteopenic. There is no acute fracture or focal osseous lesion. Paraspinal and other soft tissues: Paraspinal soft tissues are within normal limits. The bladder is distended. Right ureteral stent is present. There is severe right-sided hydronephrosis in the small amount of air in the right renal  collecting system. There are severe atherosclerotic calcifications of the aorta. Disc levels: Mild disc space narrowing and endplate osteophyte formation is seen at L2-L3 and L4-L5 compatible with degenerative change. L2-L3 there is broad-based disc bulge, bilateral facet arthropathy and thickening of the ligamentum flavum causing moderate central canal stenosis. No neural foraminal stenosis. At L3-L4 there is broad-based disc bulge with thickening of the ligamentum flavum and bilateral facet arthropathy with severe central canal stenosis. There is mild right neural foraminal stenosis. At L4-L5 there is thickening of the ligamentum flavum and bilateral facet arthropathy with broad-based disc bulge causing mild central canal stenosis. IMPRESSION: 1. No acute fracture or traumatic subluxation of the lumbar spine. 2. Multilevel degenerative changes of the lumbar spine with severe central canal stenosis at L3-L4 and moderate central canal stenosis at L2-L3. 3. New right ureteral stent with new severe right-sided hydronephrosis and small amount of air in the right renal collecting system. Aortic Atherosclerosis (ICD10-I70.0). Electronically Signed   By: ARonney AstersM.D.   On: 02/27/2022 23:26   DG C-Arm 1-60  Min-No Report  Result Date: 02/27/2022 Fluoroscopy was utilized by the requesting physician.  No radiographic interpretation.     Assessment/Plan: 77 year old female with progressive weakness and NT in her lower extremities. 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 She does have a transitional segement. Severe spinal stenosis at L3-4 and moderate at L4-5 with likely septic arthritis in her facets at L3-4. I do think that she needs surgical intervention at this point since she does have progressive weakness. We will plan for a laminectomy at L3-4 and L4-5 for evacuation of epidural abscess. We discussed this plan with the patient and her daughter. Risks for the surgery include but are  not limited to bleeding, further infection, lack of symptom relief, spinal fluid leak, need for further surgery as well as anesthesia risks. She understood the risks and benefits and agreed. Will plan for this at 9am tomorrow. NPO after midnight.  Ocie Cornfield Joeangel Jeanpaul 02/28/2022 9:25 PM

## 2022-02-28 NOTE — Progress Notes (Signed)
PROGRESS NOTE    [name]  [id number] DOB: 23-Apr-1945 DOA: 02/27/2022 PCP: Katherina Mires, MD   Brief Narrative:  Melissa Leon, a 77 y/o with complaints of acute on chronic back pain with lower extremity weakness and parasthesias as well as being able to void urine. Her history is complicated by recent right ureteral obstruction with hydronephrosis since December '23. She has had a somewhat complicated course with multiple episodes of UTI/Pyelo requiring treatment (most recent MRSA) initially requiring nephrostomy - once infection cleared right sided stent was placed (02/27/22).   Assessment & Plan:   Principal Problem:   Spinal stenosis of lumbar region with neurogenic claudication Active Problems:   Ureteral obstruction, right   Pyelonephritis   DM2 (diabetes mellitus, type 2) (HCC)   Essential hypertension   Spinal stenosis of lumbar region with neurogenic claudication - Patient with history of severe spinal stenosis at L3-L4 with pain, inability to ambulate over the past 3-4 days and urinary retention - Neurosurgery following - appreciate insight/recs - MRI spine pending - Prednisone/toradol ongoing - PRN Morphine IR ordered but not yet required            Ureteral obstruction, right Patient with ureteral stent placement 2/9 (day of admission) No reported complications at procedure - urology notified   Essential hypertension Hold losartan/hctz Continue amlodipine   DM2 (diabetes mellitus, type 2) (HCC) Serum glucose elevated to 284 possibly due to recent steroids. 01/04/22 A1C 5.6%.   Plan     Ss coverage   Leukemoid reaction Rule out UTI Normal UA 2/7 - none collected here prior to antibiotics/fluids Afebrile Isolated WBC elevated likely secondary to steroid administration and pain given above De-escalate to ceftriaxone and continue bactrim - total abx course of 3 days for presumed/questionable UTI  DVT prophylaxis: heparin Code Status: Full Family  Communication: None present  Status is: Inpt  Dispo: The patient is from: Home              Anticipated d/c is to: TBD              Anticipated d/c date is: 48-72h pending clinical/surgical course              Patient currently NOT medically stable for discharge  Consultants:  Neurosurgery  Procedures:  None planned  Antimicrobials:  Ceftriaxone/Bactrim for total 72h abx course (last dose 03/01/21)   Subjective: No acute issues/events overnight - pain well controlled, parasthesias BLE ongoing.  Denies nausea vomiting diarrhea constipation headache fevers chills chest pain or shortness of breath.  Objective: Vitals:   02/28/22 0230 02/28/22 0300 02/28/22 0458 02/28/22 0714  BP:  (!) 148/81 (!) 158/81 (!) 145/73  Pulse:  93 96 83  Resp:    15  Temp: 97.8 F (36.6 C)  98.5 F (36.9 C) 98 F (36.7 C)  TempSrc: Oral  Oral Oral  SpO2:  97% 97% 99%  Weight:      Height:        Intake/Output Summary (Last 24 hours) at 02/28/2022 0743 Last data filed at 02/28/2022 0359 Gross per 24 hour  Intake 500 ml  Output 1300 ml  Net -800 ml   Filed Weights   02/27/22 2013  Weight: 79.4 kg    Examination:  General:  Pleasantly resting in bed, No acute distress. HEENT:  Normocephalic atraumatic.  Sclerae nonicteric, noninjected.  Extraocular movements intact bilaterally. Neck:  Without mass or deformity.  Trachea is midline. Lungs:  Clear to auscultate bilaterally without  rhonchi, wheeze, or rales. Heart:  Regular rate and rhythm.  Without murmurs, rubs, or gallops. Abdomen:  Soft, nontender, nondistended.  Without guarding or rebound. Extremities: Without cyanosis, clubbing, edema, BLE decreased sensation globally  Data Reviewed: I have personally reviewed following labs and imaging studies  CBC: Recent Labs  Lab 02/23/22 1424 02/25/22 0940 02/27/22 2206  WBC 18.9* 18.6* 18.6*  NEUTROABS  --  15.9* 17.8*  HGB 12.9 12.6 10.8*  HCT 42.1 39.6 32.5*  MCV 98.1 94.5 91.3   PLT 733* 552* [id number]   Basic Metabolic Panel: Recent Labs  Lab 02/25/22 0940 02/27/22 2148  NA 134* 130*  K 3.9 4.7  CL 98 100  CO2 23 23  GLUCOSE 187* 284*  BUN 26* 24*  CREATININE 0.79 0.82  CALCIUM 9.2 9.1   GFR: Estimated Creatinine Clearance: 63.3 mL/min (by C-G formula based on SCr of 0.82 mg/dL). Liver Function Tests: Recent Labs  Lab 02/25/22 0940 02/27/22 2148  AST 19 38  ALT 30 108*  ALKPHOS 114 201*  BILITOT 0.5 1.0  PROT 7.8 7.1  ALBUMIN 3.7 2.8*   Recent Labs  Lab 02/25/22 0940  LIPASE 45   No results for input(s): "AMMONIA" in the last 168 hours. Coagulation Profile: No results for input(s): "INR", "PROTIME" in the last 168 hours. Cardiac Enzymes: No results for input(s): "CKTOTAL", "CKMB", "CKMBINDEX", "TROPONINI" in the last 168 hours. BNP (last 3 results) No results for input(s): "PROBNP" in the last 8760 hours. HbA1C: No results for input(s): "HGBA1C" in the last 72 hours. CBG: Recent Labs  Lab 02/23/22 1404 02/27/22 1250 02/28/22 0346  GLUCAP 124* 153* 217*   Lipid Profile: No results for input(s): "CHOL", "HDL", "LDLCALC", "TRIG", "CHOLHDL", "LDLDIRECT" in the last 72 hours. Thyroid Function Tests: No results for input(s): "TSH", "T4TOTAL", "FREET4", "T3FREE", "THYROIDAB" in the last 72 hours. Anemia Panel: No results for input(s): "VITAMINB12", "FOLATE", "FERRITIN", "TIBC", "IRON", "RETICCTPCT" in the last 72 hours. Sepsis Labs: No results for input(s): "PROCALCITON", "LATICACIDVEN" in the last 168 hours.  No results found for this or any previous visit (from the past 240 hour(s)).       Radiology Studies: CT LUMBAR SPINE W CONTRAST  Result Date: 02/27/2022 CLINICAL DATA:  Low back pain EXAM: CT LUMBAR SPINE WITH CONTRAST TECHNIQUE: Multidetector CT imaging of the lumbar spine was performed with intravenous contrast administration. RADIATION DOSE REDUCTION: This exam was performed according to the departmental dose-optimization  program which includes automated exposure control, adjustment of the mA and/or kV according to patient size and/or use of iterative reconstruction technique. CONTRAST:  127m OMNIPAQUE IOHEXOL 300 MG/ML  SOLN COMPARISON:  Renal stone CT 12/26/2022. FINDINGS: Segmentation: 5 lumbar type vertebrae. Alignment: There is 2 mm of anterolisthesis at L3-L4. Alignment is otherwise anatomic. Vertebrae: Bones are osteopenic. There is no acute fracture or focal osseous lesion. Paraspinal and other soft tissues: Paraspinal soft tissues are within normal limits. The bladder is distended. Right ureteral stent is present. There is severe right-sided hydronephrosis in the small amount of air in the right renal collecting system. There are severe atherosclerotic calcifications of the aorta. Disc levels: Mild disc space narrowing and endplate osteophyte formation is seen at L2-L3 and L4-L5 compatible with degenerative change. L2-L3 there is broad-based disc bulge, bilateral facet arthropathy and thickening of the ligamentum flavum causing moderate central canal stenosis. No neural foraminal stenosis. At L3-L4 there is broad-based disc bulge with thickening of the ligamentum flavum and bilateral facet arthropathy with severe central canal stenosis. There  is mild right neural foraminal stenosis. At L4-L5 there is thickening of the ligamentum flavum and bilateral facet arthropathy with broad-based disc bulge causing mild central canal stenosis. IMPRESSION: 1. No acute fracture or traumatic subluxation of the lumbar spine. 2. Multilevel degenerative changes of the lumbar spine with severe central canal stenosis at L3-L4 and moderate central canal stenosis at L2-L3. 3. New right ureteral stent with new severe right-sided hydronephrosis and small amount of air in the right renal collecting system. Aortic Atherosclerosis (ICD10-I70.0). Electronically Signed   By: Ronney Asters M.D.   On: 02/27/2022 23:26   DG C-Arm 1-60 Min-No  Report  Result Date: 02/27/2022 Fluoroscopy was utilized by the requesting physician.  No radiographic interpretation.    Scheduled Meds:  acetaminophen  650 mg Oral Q6H   amLODipine  5 mg Oral QHS   atorvastatin  10 mg Oral QHS   docusate sodium  100 mg Oral Daily   glimepiride  4 mg Oral Daily   heparin  5,000 Units Subcutaneous Q8H   insulin aspart  0-20 Units Subcutaneous Q4H   ketorolac  15 mg Intravenous Q6H   LORazepam  1 mg Oral Once   [START ON 03/02/2022] metFORMIN  1,000 mg Oral BID WC   predniSONE  20 mg Oral Q12H   sodium chloride (PF)       sulfamethoxazole-trimethoprim  1 tablet Oral BID   Continuous Infusions:  sodium chloride 75 mL/hr at 02/28/22 0522   meropenem (MERREM) IV 1 g (02/28/22 0519)   vancomycin 1,500 mg (02/28/22 0646)   [START ON 03/01/2022] vancomycin       LOS: 0 days   Time spent: 29mn  Shavonna Corella C Yuji Walth, DO [facility]  If 7PM-7AM, please contact night-coverage [email or web address]  02/28/2022, 7:43 AM

## 2022-02-28 NOTE — ED Provider Notes (Signed)
Nursing staff alerted me that bladder scan reveals 960 mL of urine in her bladder.  Order placed for Foley catheter.   Orpah Greek, MD 02/28/22 [phone]

## 2022-02-28 NOTE — Assessment & Plan Note (Signed)
Patient has been off losartan and hctz intermittently while being treated for pyelo. BP today is controlled  Plan Hold losartan and HCTZ  Continue other home medications
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
## 2022-02-28 NOTE — ED Notes (Signed)
Bladder scan = 960 mL

## 2022-02-28 NOTE — Assessment & Plan Note (Signed)
Patient with nl U/A on 02/25/22. She is afebrile. At admission she has a leukocytosis to 18.6 with 96/1/2. Steroids may be a contributing factor but having had urologic procedures today infection is possible  Plan U/A  Abx coverage: imipenem + Vanc - pharmacy to consult

## 2022-02-28 NOTE — Assessment & Plan Note (Signed)
Serum glucose elevated to 284 possibly due to recent steroids. 01/04/22 A1C 5.6%.  Plan Ss coverage

## 2022-03-01 ENCOUNTER — Encounter (HOSPITAL_COMMUNITY): Payer: Self-pay | Admitting: Internal Medicine

## 2022-03-01 ENCOUNTER — Inpatient Hospital Stay (HOSPITAL_COMMUNITY): Payer: Medicare HMO | Admitting: Anesthesiology

## 2022-03-01 ENCOUNTER — Encounter (HOSPITAL_COMMUNITY)
Admission: EM | Disposition: A | Discharge status: Home / Self Care | Payer: Self-pay | Source: Home / Self Care | Attending: Internal Medicine

## 2022-03-01 ENCOUNTER — Other Ambulatory Visit: Payer: Self-pay

## 2022-03-01 ENCOUNTER — Inpatient Hospital Stay (HOSPITAL_COMMUNITY): Payer: Medicare HMO

## 2022-03-01 DIAGNOSIS — Z9889 Other specified postprocedural states: Secondary | ICD-10-CM

## 2022-03-01 DIAGNOSIS — I10 Essential (primary) hypertension: Secondary | ICD-10-CM

## 2022-03-01 DIAGNOSIS — Z87891 Personal history of nicotine dependence: Secondary | ICD-10-CM | POA: Diagnosis not present

## 2022-03-01 DIAGNOSIS — G062 Extradural and subdural abscess, unspecified: Secondary | ICD-10-CM

## 2022-03-01 DIAGNOSIS — Z7984 Long term (current) use of oral hypoglycemic drugs: Secondary | ICD-10-CM

## 2022-03-01 DIAGNOSIS — E119 Type 2 diabetes mellitus without complications: Secondary | ICD-10-CM

## 2022-03-01 DIAGNOSIS — M48062 Spinal stenosis, lumbar region with neurogenic claudication: Secondary | ICD-10-CM | POA: Diagnosis not present

## 2022-03-01 HISTORY — PX: LUMBAR LAMINECTOMY FOR EPIDURAL ABSCESS: SHX5956

## 2022-03-01 LAB — GLUCOSE, CAPILLARY
Glucose-Capillary: 108 mg/dL — ABNORMAL HIGH (ref 70–99)
Glucose-Capillary: 133 mg/dL — ABNORMAL HIGH (ref 70–99)
Glucose-Capillary: 164 mg/dL — ABNORMAL HIGH (ref 70–99)
Glucose-Capillary: 179 mg/dL — ABNORMAL HIGH (ref 70–99)
Glucose-Capillary: 215 mg/dL — ABNORMAL HIGH (ref 70–99)
Glucose-Capillary: 228 mg/dL — ABNORMAL HIGH (ref 70–99)
Glucose-Capillary: 249 mg/dL — ABNORMAL HIGH (ref 70–99)
Glucose-Capillary: 309 mg/dL — ABNORMAL HIGH (ref 70–99)

## 2022-03-01 LAB — SURGICAL PCR SCREEN
MRSA, PCR: POSITIVE — AB
Staphylococcus aureus: POSITIVE — AB

## 2022-03-01 SURGERY — LUMBAR LAMINECTOMY FOR EPIDURAL ABSCESS
Anesthesia: General | Site: Back

## 2022-03-01 MED ORDER — METHOCARBAMOL 1000 MG/10ML IJ SOLN: 500.0000 mg | Freq: Four times a day (QID) | INTRAVENOUS | Status: DC | PRN

## 2022-03-01 MED ORDER — LOSARTAN POTASSIUM 50 MG PO TABS
100.0000 mg | ORAL_TABLET | Freq: Every day | ORAL | Status: DC
  Administered 2022-03-01 – 2022-03-06 (×6): 100 mg via ORAL
  Filled 2022-03-01 (×6): qty 2

## 2022-03-01 MED ORDER — VANCOMYCIN HCL IN DEXTROSE 1-5 GM/200ML-% IV SOLN
INTRAVENOUS | Status: AC
  Filled 2022-03-01: qty 200

## 2022-03-01 MED ORDER — SUGAMMADEX SODIUM 200 MG/2ML IV SOLN
INTRAVENOUS | Status: DC | PRN
  Administered 2022-03-01: 200 mg via INTRAVENOUS

## 2022-03-01 MED ORDER — CHLORHEXIDINE GLUCONATE 0.12 % MT SOLN: 15.0000 mL | Freq: Once | OROMUCOSAL | Status: AC

## 2022-03-01 MED ORDER — ROCURONIUM BROMIDE 10 MG/ML (PF) SYRINGE
PREFILLED_SYRINGE | INTRAVENOUS | Status: DC | PRN
  Administered 2022-03-01: 60 mg via INTRAVENOUS
  Administered 2022-03-01: 40 mg via INTRAVENOUS

## 2022-03-01 MED ORDER — LIDOCAINE 2% (20 MG/ML) 5 ML SYRINGE
INTRAMUSCULAR | Status: DC | PRN
  Administered 2022-03-01: 60 mg via INTRAVENOUS

## 2022-03-01 MED ORDER — ACETAMINOPHEN 10 MG/ML IV SOLN
INTRAVENOUS | Status: DC | PRN
  Administered 2022-03-01: 1000 mg via INTRAVENOUS

## 2022-03-01 MED ORDER — FENTANYL CITRATE (PF) 250 MCG/5ML IJ SOLN
INTRAMUSCULAR | Status: AC
  Filled 2022-03-01: qty 5

## 2022-03-01 MED ORDER — SODIUM CHLORIDE 0.9 % IV SOLN
INTRAVENOUS | Status: AC
  Filled 2022-03-01: qty 20

## 2022-03-01 MED ORDER — FENTANYL CITRATE (PF) 250 MCG/5ML IJ SOLN
INTRAMUSCULAR | Status: DC | PRN
  Administered 2022-03-01 (×3): 50 ug via INTRAVENOUS

## 2022-03-01 MED ORDER — FENTANYL CITRATE (PF) 100 MCG/2ML IJ SOLN
25.0000 ug | INTRAMUSCULAR | Status: DC | PRN
  Administered 2022-03-01: 50 ug via INTRAVENOUS

## 2022-03-01 MED ORDER — HYDROCHLOROTHIAZIDE 25 MG PO TABS
25.0000 mg | ORAL_TABLET | Freq: Every morning | ORAL | Status: DC
  Administered 2022-03-01 – 2022-03-06 (×6): 25 mg via ORAL
  Filled 2022-03-01 (×6): qty 1

## 2022-03-01 MED ORDER — ACETAMINOPHEN 500 MG PO TABS
1000.0000 mg | ORAL_TABLET | Freq: Four times a day (QID) | ORAL | Status: AC
  Administered 2022-03-01 – 2022-03-02 (×4): 1000 mg via ORAL
  Filled 2022-03-01 (×4): qty 2

## 2022-03-01 MED ORDER — METHOCARBAMOL 500 MG PO TABS
500.0000 mg | ORAL_TABLET | Freq: Four times a day (QID) | ORAL | Status: DC | PRN
  Administered 2022-03-02: 500 mg via ORAL
  Filled 2022-03-01: qty 1

## 2022-03-01 MED ORDER — SENNA 8.6 MG PO TABS
1.0000 | ORAL_TABLET | Freq: Two times a day (BID) | ORAL | Status: DC
  Administered 2022-03-01 – 2022-03-03 (×5): 8.6 mg via ORAL
  Filled 2022-03-01 (×6): qty 1

## 2022-03-01 MED ORDER — SODIUM CHLORIDE 0.9% FLUSH: 3.0000 mL | INTRAVENOUS | Status: DC | PRN

## 2022-03-01 MED ORDER — VANCOMYCIN HCL 1000 MG IV SOLR
INTRAVENOUS | Status: DC | PRN
  Administered 2022-03-01: 1000 mg

## 2022-03-01 MED ORDER — CELECOXIB 200 MG PO CAPS
200.0000 mg | ORAL_CAPSULE | Freq: Two times a day (BID) | ORAL | Status: DC
  Administered 2022-03-01 – 2022-03-06 (×11): 200 mg via ORAL
  Filled 2022-03-01 (×11): qty 1

## 2022-03-01 MED ORDER — ROCURONIUM BROMIDE 10 MG/ML (PF) SYRINGE
PREFILLED_SYRINGE | INTRAVENOUS | Status: AC
  Filled 2022-03-01: qty 10

## 2022-03-01 MED ORDER — VANCOMYCIN HCL 1500 MG/300ML IV SOLN
1500.0000 mg | INTRAVENOUS | Status: DC
  Administered 2022-03-02 – 2022-03-03 (×2): 1500 mg via INTRAVENOUS
  Filled 2022-03-01 (×4): qty 300

## 2022-03-01 MED ORDER — POTASSIUM CHLORIDE IN NACL 20-0.9 MEQ/L-% IV SOLN
INTRAVENOUS | Status: DC
  Filled 2022-03-01: qty 1000

## 2022-03-01 MED ORDER — OXYCODONE HCL 5 MG PO TABS
5.0000 mg | ORAL_TABLET | ORAL | Status: DC | PRN
  Administered 2022-03-01 – 2022-03-04 (×2): 5 mg via ORAL
  Filled 2022-03-01 (×2): qty 1

## 2022-03-01 MED ORDER — PHENYLEPHRINE 80 MCG/ML (10ML) SYRINGE FOR IV PUSH (FOR BLOOD PRESSURE SUPPORT)
PREFILLED_SYRINGE | INTRAVENOUS | Status: DC | PRN
  Administered 2022-03-01 (×3): 200 ug via INTRAVENOUS

## 2022-03-01 MED ORDER — DEXAMETHASONE SODIUM PHOSPHATE 10 MG/ML IJ SOLN
INTRAMUSCULAR | Status: DC | PRN
  Administered 2022-03-01: 10 mg via INTRAVENOUS

## 2022-03-01 MED ORDER — THROMBIN 20000 UNITS EX SOLR
CUTANEOUS | Status: AC
  Filled 2022-03-01: qty 20000

## 2022-03-01 MED ORDER — THROMBIN 5000 UNITS EX SOLR
OROMUCOSAL | Status: DC | PRN
  Administered 2022-03-01: 5 mL via TOPICAL

## 2022-03-01 MED ORDER — CHLORHEXIDINE GLUCONATE 0.12 % MT SOLN
OROMUCOSAL | Status: AC
  Administered 2022-03-01: 15 mL via OROMUCOSAL
  Filled 2022-03-01: qty 15

## 2022-03-01 MED ORDER — LACTATED RINGERS IV SOLN: INTRAVENOUS | Status: DC

## 2022-03-01 MED ORDER — ONDANSETRON HCL 4 MG/2ML IJ SOLN
INTRAMUSCULAR | Status: AC
  Filled 2022-03-01: qty 2

## 2022-03-01 MED ORDER — CHLORHEXIDINE GLUCONATE CLOTH 2 % EX PADS: 6.0000 | MEDICATED_PAD | Freq: Every day | CUTANEOUS | Status: DC

## 2022-03-01 MED ORDER — VANCOMYCIN HCL 500 MG/100ML IV SOLN
500.0000 mg | Freq: Once | INTRAVENOUS | Status: AC
  Administered 2022-03-01: 500 mg via INTRAVENOUS
  Filled 2022-03-01: qty 100

## 2022-03-01 MED ORDER — INSULIN ASPART 100 UNIT/ML IJ SOLN: 0.0000 [IU] | Freq: Three times a day (TID) | INTRAMUSCULAR | Status: DC

## 2022-03-01 MED ORDER — THROMBIN 20000 UNITS EX SOLR
CUTANEOUS | Status: DC | PRN
  Administered 2022-03-01: 20 mL via TOPICAL

## 2022-03-01 MED ORDER — LIDOCAINE 2% (20 MG/ML) 5 ML SYRINGE
INTRAMUSCULAR | Status: AC
  Filled 2022-03-01: qty 5

## 2022-03-01 MED ORDER — ONDANSETRON HCL 4 MG/2ML IJ SOLN
INTRAMUSCULAR | Status: DC | PRN
  Administered 2022-03-01: 4 mg via INTRAVENOUS

## 2022-03-01 MED ORDER — ONDANSETRON HCL 4 MG PO TABS: 4.0000 mg | ORAL_TABLET | Freq: Four times a day (QID) | ORAL | Status: DC | PRN

## 2022-03-01 MED ORDER — MUPIROCIN 2 % EX OINT
1.0000 | TOPICAL_OINTMENT | Freq: Two times a day (BID) | CUTANEOUS | Status: DC
  Administered 2022-03-02 – 2022-03-06 (×9): 1 via NASAL
  Filled 2022-03-01: qty 22

## 2022-03-01 MED ORDER — INSULIN ASPART 100 UNIT/ML IJ SOLN
0.0000 [IU] | Freq: Three times a day (TID) | INTRAMUSCULAR | Status: DC
  Administered 2022-03-01: 3 [IU] via SUBCUTANEOUS
  Administered 2022-03-01: 11 [IU] via SUBCUTANEOUS
  Administered 2022-03-02: 5 [IU] via SUBCUTANEOUS
  Administered 2022-03-02: 8 [IU] via SUBCUTANEOUS
  Administered 2022-03-03 – 2022-03-04 (×2): 2 [IU] via SUBCUTANEOUS
  Administered 2022-03-05 – 2022-03-06 (×2): 3 [IU] via SUBCUTANEOUS

## 2022-03-01 MED ORDER — VANCOMYCIN HCL 1000 MG IV SOLR
INTRAVENOUS | Status: AC
  Filled 2022-03-01: qty 20

## 2022-03-01 MED ORDER — SODIUM CHLORIDE 0.9 % IV SOLN: 1.0000 g | Freq: Two times a day (BID) | INTRAVENOUS | Status: DC

## 2022-03-01 MED ORDER — PROPOFOL 10 MG/ML IV BOLUS
INTRAVENOUS | Status: DC | PRN
  Administered 2022-03-01: 200 mg via INTRAVENOUS

## 2022-03-01 MED ORDER — PHENOL 1.4 % MT LIQD: 1.0000 | OROMUCOSAL | Status: DC | PRN

## 2022-03-01 MED ORDER — AMISULPRIDE (ANTIEMETIC) 5 MG/2ML IV SOLN: 10.0000 mg | Freq: Once | INTRAVENOUS | Status: DC | PRN

## 2022-03-01 MED ORDER — ONDANSETRON HCL 4 MG/2ML IJ SOLN: 4.0000 mg | Freq: Four times a day (QID) | INTRAMUSCULAR | Status: DC | PRN

## 2022-03-01 MED ORDER — MENTHOL 3 MG MT LOZG: 1.0000 | LOZENGE | OROMUCOSAL | Status: DC | PRN

## 2022-03-01 MED ORDER — PHENYLEPHRINE HCL-NACL 20-0.9 MG/250ML-% IV SOLN
INTRAVENOUS | Status: DC | PRN
  Administered 2022-03-01: 50 ug/min via INTRAVENOUS

## 2022-03-01 MED ORDER — ORAL CARE MOUTH RINSE: 15.0000 mL | Freq: Once | OROMUCOSAL | Status: AC

## 2022-03-01 MED ORDER — THROMBIN 5000 UNITS EX SOLR
CUTANEOUS | Status: AC
  Filled 2022-03-01: qty 5000

## 2022-03-01 MED ORDER — MORPHINE SULFATE (PF) 2 MG/ML IV SOLN
2.0000 mg | INTRAVENOUS | Status: DC | PRN
  Administered 2022-03-01: 2 mg via INTRAVENOUS
  Filled 2022-03-01: qty 1

## 2022-03-01 MED ORDER — 0.9 % SODIUM CHLORIDE (POUR BTL) OPTIME
TOPICAL | Status: DC | PRN
  Administered 2022-03-01: 1000 mL

## 2022-03-01 MED ORDER — SODIUM CHLORIDE 0.9 % IV SOLN
1.0000 g | Freq: Two times a day (BID) | INTRAVENOUS | Status: AC
  Administered 2022-03-01 – 2022-03-02 (×3): 1 g via INTRAVENOUS
  Filled 2022-03-01 (×3): qty 10

## 2022-03-01 MED ORDER — SODIUM CHLORIDE 0.9 % IV SOLN: 250.0000 mL | INTRAVENOUS | Status: DC

## 2022-03-01 MED ORDER — VANCOMYCIN HCL 1000 MG IV SOLR
INTRAVENOUS | Status: DC | PRN
  Administered 2022-03-01: 1000 mg via INTRAVENOUS

## 2022-03-01 MED ORDER — SODIUM CHLORIDE 0.9 % IV SOLN: INTRAVENOUS | Status: DC | PRN

## 2022-03-01 MED ORDER — PROPOFOL 10 MG/ML IV BOLUS
INTRAVENOUS | Status: AC
  Filled 2022-03-01: qty 20

## 2022-03-01 MED ORDER — SODIUM CHLORIDE 0.9% FLUSH
3.0000 mL | Freq: Two times a day (BID) | INTRAVENOUS | Status: DC
  Administered 2022-03-01 – 2022-03-06 (×7): 3 mL via INTRAVENOUS

## 2022-03-01 MED ORDER — ACETAMINOPHEN 10 MG/ML IV SOLN
INTRAVENOUS | Status: AC
  Filled 2022-03-01: qty 100

## 2022-03-01 SURGICAL SUPPLY — 42 items
BAG COUNTER SPONGE SURGICOUNT (BAG) ×1 IMPLANT
BAND RUBBER #18 3X1/16 STRL (MISCELLANEOUS) ×2 IMPLANT
BENZOIN TINCTURE PRP APPL 2/3 (GAUZE/BANDAGES/DRESSINGS) ×1 IMPLANT
BUR CARBIDE MATCH 3.0 (BURR) ×1 IMPLANT
CANISTER SUCT 3000ML PPV (MISCELLANEOUS) ×1 IMPLANT
DERMABOND ADVANCED .7 DNX6 (GAUZE/BANDAGES/DRESSINGS) IMPLANT
DRAPE LAPAROTOMY 100X72X124 (DRAPES) ×1 IMPLANT
DRAPE MICROSCOPE SLANT 54X150 (MISCELLANEOUS) ×1 IMPLANT
DRAPE SURG 17X23 STRL (DRAPES) ×1 IMPLANT
DRSG OPSITE POSTOP 4X6 (GAUZE/BANDAGES/DRESSINGS) IMPLANT
DURAPREP 26ML APPLICATOR (WOUND CARE) ×1 IMPLANT
ELECT REM PT RETURN 9FT ADLT (ELECTROSURGICAL) ×1
ELECTRODE REM PT RTRN 9FT ADLT (ELECTROSURGICAL) ×1 IMPLANT
GAUZE 4X4 16PLY ~~LOC~~+RFID DBL (SPONGE) IMPLANT
GLOVE BIO SURGEON STRL SZ 6.5 (GLOVE) IMPLANT
GLOVE BIO SURGEON STRL SZ7 (GLOVE) IMPLANT
GLOVE BIO SURGEON STRL SZ8 (GLOVE) ×1 IMPLANT
GLOVE BIOGEL PI IND STRL 7.0 (GLOVE) IMPLANT
GOWN STRL REUS W/ TWL LRG LVL3 (GOWN DISPOSABLE) IMPLANT
GOWN STRL REUS W/ TWL XL LVL3 (GOWN DISPOSABLE) ×1 IMPLANT
GOWN STRL REUS W/TWL 2XL LVL3 (GOWN DISPOSABLE) IMPLANT
GOWN STRL REUS W/TWL LRG LVL3 (GOWN DISPOSABLE) ×2
GOWN STRL REUS W/TWL XL LVL3 (GOWN DISPOSABLE) ×1
HEMOSTAT POWDER KIT SURGIFOAM (HEMOSTASIS) ×1 IMPLANT
KIT BASIN OR (CUSTOM PROCEDURE TRAY) ×1 IMPLANT
KIT STIMULAN RAPID CURE 5CC (Orthopedic Implant) IMPLANT
KIT TURNOVER KIT B (KITS) ×1 IMPLANT
NDL HYPO 25X1 1.5 SAFETY (NEEDLE) ×1 IMPLANT
NDL SPNL 20GX3.5 QUINCKE YW (NEEDLE) IMPLANT
NEEDLE HYPO 25X1 1.5 SAFETY (NEEDLE) ×1 IMPLANT
NEEDLE SPNL 20GX3.5 QUINCKE YW (NEEDLE) ×1 IMPLANT
NS IRRIG 1000ML POUR BTL (IV SOLUTION) ×1 IMPLANT
PACK LAMINECTOMY NEURO (CUSTOM PROCEDURE TRAY) ×1 IMPLANT
PAD ARMBOARD 7.5X6 YLW CONV (MISCELLANEOUS) ×3 IMPLANT
STRIP CLOSURE SKIN 1/2X4 (GAUZE/BANDAGES/DRESSINGS) ×1 IMPLANT
SUT VIC AB 0 CT1 18XCR BRD8 (SUTURE) ×1 IMPLANT
SUT VIC AB 0 CT1 8-18 (SUTURE) ×1
SUT VIC AB 2-0 CP2 18 (SUTURE) ×1 IMPLANT
SUT VIC AB 3-0 SH 8-18 (SUTURE) ×1 IMPLANT
TOWEL GREEN STERILE (TOWEL DISPOSABLE) ×1 IMPLANT
TOWEL GREEN STERILE FF (TOWEL DISPOSABLE) ×1 IMPLANT
WATER STERILE IRR 1000ML POUR (IV SOLUTION) ×1 IMPLANT

## 2022-03-01 NOTE — Anesthesia Postprocedure Evaluation (Signed)
Anesthesia Post Note  Patient: Melissa Leon  Procedure(s) Performed: LUMBAR LAMINECTOMY LUMBAR THREE-FOUR, LUMBAR FOUR- FIVE FOR EPIDURAL ABSCESS (Back)     Patient location during evaluation: PACU Anesthesia Type: General Level of consciousness: awake and alert Pain management: pain level controlled Vital Signs Assessment: post-procedure vital signs reviewed and stable Respiratory status: spontaneous breathing, nonlabored ventilation, respiratory function stable and patient connected to nasal cannula oxygen Cardiovascular status: blood pressure returned to baseline and stable Postop Assessment: no apparent nausea or vomiting Anesthetic complications: no  No notable events documented.  Last Vitals:  Vitals:   03/01/22 1207 03/01/22 1700  BP: (!) 172/83 (!) 168/84  Pulse: 82 89  Resp: 18 17  Temp: 36.7 C 36.9 C  SpO2: 98% 97%    Last Pain:  Vitals:   03/01/22 1700  TempSrc: Oral  PainSc:                  Tiajuana Amass

## 2022-03-01 NOTE — Progress Notes (Addendum)
Pharmacy Antibiotic Note  Melissa Leon is a 77 y.o. female admitted on 02/27/2022 with  epidural abscess .  Pharmacy has been consulted for vancomycin dosing. Vancomycin and meropenem were given once on 2/10, thus another loading dose is not necessary. Bactrim will be discontinued. Finishing CTX course for UTI.  PM Update: Patient received 1 g of vancomycin IV in the OR. Dosed an additional 500 mg IV vancomycin to fulfill full dose on the floor. 1500 mg IV vancomycin to resume 2/12 AM.  Plan: Vancomycin 1500 mg IV q24hr (Scr 0.8, IBW, AUC 509.4) Continue to monitor renal function, s/sx of improvement, and cultures De-escalate as able Obtain levels as clinically necessary  Height: 5' 7"$  (170.2 cm) Weight: 79.4 kg (175 lb) IBW/kg (Calculated) : 61.6  Temp (24hrs), Avg:98 F (36.7 C), Min:97.4 F (36.3 C), Max:98.6 F (37 C)  Recent Labs  Lab 02/23/22 1424 02/25/22 0940 02/27/22 2148 02/27/22 2206  WBC 18.9* 18.6*  --  18.6*  CREATININE  --  0.79 0.82  --     Estimated Creatinine Clearance: 63.3 mL/min (by C-G formula based on SCr of 0.82 mg/dL).    Allergies  Allergen Reactions   Choline Fenofibrate Other (See Comments)    ELEVATED LFT(s)   Penicillins Hives   Ozempic (0.25 Or 0.5 Mg-Dose) [Semaglutide(0.25 Or 0.38m-Dos)] Nausea Only and Other (See Comments)    SEVERE NAUSEA   Statins     Aches, pain    Antimicrobials this admission: Vancomycin 15050m2/10 >> Merrem 1gm x 1 2/10 CTX 2/10 >> [2/13] Bactrim DS 2/10 >> 2/11  Dose adjustments this admission: None.  Microbiology results: 2/10 BCx: Sent   Thank you for allowing pharmacy to be a part of this patient's care.  MaVarney DailyPharmD PGY2 Pharmacy Resident 03/01/2022 8:00 AM  Please check AMION for [facility] After 10:00 PM call main pharmacy [phone]

## 2022-03-01 NOTE — Progress Notes (Signed)
PROGRESS NOTE    Melissa Leon  [id number] DOB: 01/30/1945 DOA: 02/27/2022 PCP: Melissa Mires, MD   Brief Narrative:  Mrs. Leon, a 77 y/o with complaints of acute on chronic back pain with lower extremity weakness and parasthesias as well as being able to void urine. Her history is complicated by recent right ureteral obstruction with hydronephrosis since December '23. She has had a somewhat complicated course with multiple episodes of UTI/Pyelo requiring treatment (most recent MRSA) initially requiring nephrostomy - once infection cleared right sided stent was placed (02/27/22).   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 - Patient with history of severe spinal stenosis at L3-L4 with pain, inability to ambulate over the past 3-4 days and urinary retention - Neurosurgery following - Surgery 03/01/22 - MRI spine confirms stenosis - likely secondary to L3-S1 epidural abscess - Prednisone/toradol ongoing - PRN Morphine IR ordered but not yet required            Ureteral obstruction, right Patient with ureteral stent placement 2/9 (day of admission) No reported complications at procedure - urology notified   Essential hypertension Hold losartan/hctz Continue amlodipine   DM2 (diabetes mellitus, type 2) (HCC) Serum glucose elevated to 284 possibly due to recent steroids. 01/04/22 A1C 5.6%.   Plan     Ss coverage   Epidural abscess as above Cannot rule out concurrent UTI Normal UA 2/7 - none collected here prior to antibiotics/fluids Afebrile Surgery as above, vancomycin added until cultures result. Ceftriaxone x3 days to cover ?UTI.  DVT prophylaxis: heparin Code Status:  Full Family Communication: None present  Status is: Inpt  Dispo: The patient is from: Home              Anticipated d/c is to: TBD pending post surgical ambulation/symptoms              Anticipated d/c date is: 48-72h pending clinical/surgical course              Patient currently NOT medically stable for discharge  Consultants:  Neurosurgery  Procedures:  None planned  Antimicrobials:  Ceftriaxone/Bactrim for total 72h abx course (last dose 03/01/21)   Subjective: No acute issues/events overnight - pain well controlled, parasthesias BLE ongoing.  Denies nausea vomiting diarrhea constipation headache fevers chills chest pain or shortness of breath.  Objective: Vitals:   02/28/22 1747 02/28/22 2007 02/28/22 2316 03/01/22 0311  BP: (!) 152/75 (!) 153/70 (!) 151/71 (!) 159/73  Pulse: 77 71 66 70  Resp: 17 16 14 14  $ Temp: 98.1 F (36.7 C) 97.8 F (36.6 C) 98.6 F (37 C) (!) 97.4 F (36.3 C)  TempSrc: Oral Oral  Oral  SpO2: 97% 96% 93% 99%  Weight:      Height:        Intake/Output Summary (Last 24 hours) at 03/01/2022 0753 Last data filed at 03/01/2022 0500 Gross per 24 hour  Intake 1515.57 ml  Output 1800 ml  Net -284.43 ml    Filed Weights   02/27/22 2013  Weight: 79.4 kg    Examination:  General:  Pleasantly resting in bed, No acute distress. HEENT:  Normocephalic atraumatic.  Sclerae nonicteric, noninjected.  Extraocular movements intact bilaterally. Neck:  Without mass or  deformity.  Trachea is midline. Lungs:  Clear to auscultate bilaterally without rhonchi, wheeze, or rales. Heart:  Regular rate and rhythm.  Without murmurs, rubs, or gallops. Abdomen:  Soft, nontender, nondistended.  Without guarding or rebound. Extremities: Without cyanosis, clubbing, edema, BLE decreased sensation globally  Data Reviewed: I have personally reviewed following labs and imaging studies  CBC: Recent Labs  Lab 02/23/22 1424 02/25/22 0940 02/27/22 2206  WBC 18.9*  18.6* 18.6*  NEUTROABS  --  15.9* 17.8*  HGB 12.9 12.6 10.8*  HCT 42.1 39.6 32.5*  MCV 98.1 94.5 91.3  PLT 733* 552* [id number]    Basic Metabolic Panel: Recent Labs  Lab 02/25/22 0940 02/27/22 2148  NA 134* 130*  K 3.9 4.7  CL 98 100  CO2 23 23  GLUCOSE 187* 284*  BUN 26* 24*  CREATININE 0.79 0.82  CALCIUM 9.2 9.1    GFR: Estimated Creatinine Clearance: 63.3 mL/min (by C-G formula based on SCr of 0.82 mg/dL). Liver Function Tests: Recent Labs  Lab 02/25/22 0940 02/27/22 2148  AST 19 38  ALT 30 108*  ALKPHOS 114 201*  BILITOT 0.5 1.0  PROT 7.8 7.1  ALBUMIN 3.7 2.8*    Recent Labs  Lab 02/25/22 0940  LIPASE 45    No results for input(s): "AMMONIA" in the last 168 hours. Coagulation Profile: No results for input(s): "INR", "PROTIME" in the last 168 hours. Cardiac Enzymes: No results for input(s): "CKTOTAL", "CKMB", "CKMBINDEX", "TROPONINI" in the last 168 hours. BNP (last 3 results) No results for input(s): "PROBNP" in the last 8760 hours. HbA1C: No results for input(s): "HGBA1C" in the last 72 hours. CBG: Recent Labs  Lab 02/28/22 1304 02/28/22 1751 02/28/22 2012 03/01/22 0044 03/01/22 0310  GLUCAP 193* 156* 270* 215* 164*    Lipid Profile: No results for input(s): "CHOL", "HDL", "LDLCALC", "TRIG", "CHOLHDL", "LDLDIRECT" in the last 72 hours. Thyroid Function Tests: No results for input(s): "TSH", "T4TOTAL", "FREET4", "T3FREE", "THYROIDAB" in the last 72 hours. Anemia Panel: No results for input(s): "VITAMINB12", "FOLATE", "FERRITIN", "TIBC", "IRON", "RETICCTPCT" in the last 72 hours. Sepsis Labs: No results for input(s): "PROCALCITON", "LATICACIDVEN" in the last 168 hours.  No results found for this or any previous visit (from the past 240 hour(s)).       Radiology Studies: MR Lumbar Spine W Wo Contrast  Result Date: 02/28/2022 CLINICAL DATA:  Spinal stenosis, lumbar neurogenic claudication. Leukocytosis. EXAM: MRI LUMBAR SPINE WITHOUT AND  WITH CONTRAST TECHNIQUE: Multiplanar and multiecho pulse sequences of the lumbar spine were obtained without and with intravenous contrast. 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 Incompletely developed disc space at the L5-S1 level. Lowest well developed disc space is labeled as L4-L5. The lowest rib-bearing segment is labeled as T12 (corresponding to axial series 6, image 6). Alignment: Grade 1 anterolisthesis of L3 on L4. Minimal anterolisthesis of L4 on L5. Vertebrae: No acute fracture. No evidence of discitis. Bilateral facet joint effusions at L3-L4 concerning for septic arthritis, particularly on the left (series 4, image 11). Mild subchondral marrow edema which may be reactive. Early changes of osteomyelitis are not excluded. Benign intraosseous hemangiomas most notably within the L4 and L1 vertebral bodies. No suspicious marrow replacing bone lesion. Conus medullaris and cauda equina: Conus extends to the T12-L1 level. Conus and cauda equina appear normal. Rim enhancing fluid collection within the posterior epidural space spanning from the level of the  mid L3 vertebral body to the L5-S1 disc space (series 8, image 9). Approximate dimensions of 1.1 x 0.9 cm transaxially and extending craniocaudally 7.8 cm. Paraspinal and other soft tissues: Lower posterior paraspinal muscle edema. There is a small abscess within the soft tissues adjacent to the L4 spinous process on the left, which appears to be arising from the inferior margin of the left L3-L4 facet joint measuring approximately 2.4 x 1.9 x 2.1 cm (series 8, image 11). Fluid collection versus complex synovial cyst posterior to the right L3-L4 facet joint measuring 1.8 x 1.1 x 1.1 cm (series 6, image 31). No inflammatory changes or fluid collections within the psoas musculature. Disc levels: T12-L1: Mild facet  arthropathy.  No foraminal or canal stenosis. L1-L2: Mild facet arthropathy.  No foraminal or canal stenosis. L2-L3: Mild annular disc bulge with mild-to-moderate bilateral facet arthropathy and ligamentum flavum buckling. Mild-to-moderate canal stenosis. No significant foraminal stenosis. L3-L4: Anterolisthesis with disc uncovering and diffuse disc bulge. Severe bilateral facet arthropathy and ligamentum flavum buckling. Posterior epidural space collection. Findings contribute to severe canal stenosis with severe bilateral subarticular recess stenosis and moderate bilateral foraminal stenosis. L4-L5: Broad right foraminal to far lateral disc protrusion. Severe bilateral facet arthropathy with ligamentum flavum buckling. Fluid collection in the posterior epidural space. Findings contribute to moderate canal stenosis with right worse than left subarticular recess stenosis. Moderate right and mild left foraminal stenosis. L5-S1: Transitional level.  No impingement. IMPRESSION: 1. 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 2. Bilateral facet joint effusions at L3-L4 concerning for septic arthritis, particularly on the left. Mild subchondral marrow edema which may be reactive. Early changes of osteomyelitis are not excluded. 3. 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 Urgent Neurosurgical evaluation recommended. 4. Small abscess within the posterior paraspinal soft tissues likely arising from the left L3-L4 facet joint measuring approximately 2.4 x 1.9 x 2.1 cm. 5. Fluid collection versus complex synovial cyst posterior to the right L3-L4 facet joint measuring 1.8 x 1.1 x 1.1 cm. 6. Severe canal stenosis with severe bilateral subarticular recess stenosis and moderate bilateral foraminal stenosis at L3-L4. Canal stenosis is in part secondary to the epidural abscess at this level. 7. Moderate canal stenosis at L4-L5 with moderate  right and mild left foraminal stenosis. 8. Mild-to-moderate canal stenosis at L2-L3. 9. No findings to suggest discitis at any level within the lumbar spine. These results were called by telephone at the time of interpretation on 02/28/2022 at 5:08 pm to provider Little Ishikawa, MD, who verbally acknowledged these results. Per Dr. Avon Gully, Neurosurgery is also aware of the above findings. Electronically Signed   By: Davina Poke D.O.   On: 02/28/2022 17:10   CT LUMBAR SPINE W CONTRAST  Result Date: 02/27/2022 CLINICAL DATA:  Low back pain EXAM: CT LUMBAR SPINE WITH CONTRAST TECHNIQUE: Multidetector CT imaging of the lumbar spine was performed with intravenous contrast administration. RADIATION DOSE REDUCTION: This exam was performed according to the departmental dose-optimization program which includes automated exposure control, adjustment of the mA and/or kV according to patient size and/or use of iterative reconstruction technique. CONTRAST:  152m OMNIPAQUE IOHEXOL 300 MG/ML  SOLN COMPARISON:  Renal stone CT 12/26/2022. FINDINGS: Segmentation: 5 lumbar type vertebrae. Alignment: There is 2 mm of anterolisthesis at L3-L4. Alignment is otherwise anatomic. Vertebrae: Bones are osteopenic. There is no acute fracture or focal osseous lesion. Paraspinal and other soft tissues: Paraspinal soft  tissues are within normal limits. The bladder is distended. Right ureteral stent is present. There is severe right-sided hydronephrosis in the small amount of air in the right renal collecting system. There are severe atherosclerotic calcifications of the aorta. Disc levels: Mild disc space narrowing and endplate osteophyte formation is seen at L2-L3 and L4-L5 compatible with degenerative change. L2-L3 there is broad-based disc bulge, bilateral facet arthropathy and thickening of the ligamentum flavum causing moderate central canal stenosis. No neural foraminal stenosis. At L3-L4 there is broad-based disc bulge with  thickening of the ligamentum flavum and bilateral facet arthropathy with severe central canal stenosis. There is mild right neural foraminal stenosis. At L4-L5 there is thickening of the ligamentum flavum and bilateral facet arthropathy with broad-based disc bulge causing mild central canal stenosis. IMPRESSION: 1. No acute fracture or traumatic subluxation of the lumbar spine. 2. Multilevel degenerative changes of the lumbar spine with severe central canal stenosis at L3-L4 and moderate central canal stenosis at L2-L3. 3. New right ureteral stent with new severe right-sided hydronephrosis and small amount of air in the right renal collecting system. Aortic Atherosclerosis (ICD10-I70.0). Electronically Signed   By: Ronney Asters M.D.   On: 02/27/2022 23:26   DG C-Arm 1-60 Min-No Report  Result Date: 02/27/2022 Fluoroscopy was utilized by the requesting physician.  No radiographic interpretation.    Scheduled Meds:  acetaminophen  650 mg Oral Q6H   amLODipine  5 mg Oral QHS   docusate sodium  100 mg Oral Daily   glimepiride  4 mg Oral Daily   insulin aspart  0-20 Units Subcutaneous Q4H   ketorolac  15 mg Intravenous Q6H   [START ON 03/02/2022] metFORMIN  1,000 mg Oral BID WC   predniSONE  20 mg Oral Q12H   sulfamethoxazole-trimethoprim  1 tablet Oral BID   Continuous Infusions:  sodium chloride 75 mL/hr at 03/01/22 0500   cefTRIAXone (ROCEPHIN)  IV 200 mL/hr at 02/28/22 1100     LOS: 1 day   Time spent: 78mn  Taelor Moncada C Rinda Rollyson, DO [facility]  If 7PM-7AM, please contact night-coverage [email or web address]  03/01/2022, 7:53 AM

## 2022-03-01 NOTE — Anesthesia Procedure Notes (Signed)
Procedure Name: Intubation Date/Time: 03/01/2022 9:38 AM  Performed by: Lance Coon, CRNAPre-anesthesia Checklist: Patient identified, Emergency Drugs available, Suction available, Patient being monitored and Timeout performed Patient Re-evaluated:Patient Re-evaluated prior to induction Oxygen Delivery Method: Circle system utilized Preoxygenation: Pre-oxygenation with 100% oxygen Induction Type: IV induction Ventilation: Mask ventilation without difficulty Laryngoscope Size: Miller and 3 Grade View: Grade I Tube type: Oral Tube size: 7.0 mm Number of attempts: 1 Airway Equipment and Method: Stylet Placement Confirmation: ETT inserted through vocal cords under direct vision, positive ETCO2 and breath sounds checked- equal and bilateral Secured at: 21 cm Tube secured with: Tape Dental Injury: Teeth and Oropharynx as per pre-operative assessment

## 2022-03-01 NOTE — Transfer of Care (Signed)
Immediate Anesthesia Transfer of Care Note  Patient: Melissa Leon  Procedure(s) Performed: LUMBAR LAMINECTOMY LUMBAR THREE-FOUR, LUMBAR FOUR- FIVE FOR EPIDURAL ABSCESS (Back)  Patient Location: PACU  Anesthesia Type:General  Level of Consciousness: drowsy and patient cooperative  Airway & Oxygen Therapy: Patient Spontanous Breathing  Post-op Assessment: Report given to RN and Post -op Vital signs reviewed and stable  Post vital signs: Reviewed and stable  Last Vitals:  Vitals Value Taken Time  BP 165/73 03/01/22 1113  Temp    Pulse 84 03/01/22 1114  Resp 0 03/01/22 1114  SpO2 99 % 03/01/22 1114  Vitals shown include unvalidated device data.  Last Pain:  Vitals:   03/01/22 0809  TempSrc: Oral  PainSc:          Complications: No notable events documented.

## 2022-03-01 NOTE — Anesthesia Preprocedure Evaluation (Addendum)
Anesthesia Evaluation  Patient identified by MRN, date of birth, ID band Patient awake    Reviewed: Allergy & Precautions, NPO status , Patient's Chart, lab work & pertinent test results  Airway Mallampati: II  TM Distance: >3 FB Neck ROM: Full    Dental  (+) Dental Advisory Given   Pulmonary former smoker   breath sounds clear to auscultation       Cardiovascular hypertension, Pt. on medications  Rhythm:Regular Rate:Normal     Neuro/Psych  Neuromuscular disease (epidural abscess)    GI/Hepatic negative GI ROS, Neg liver ROS,,,  Endo/Other  diabetes, Type 2, Oral Hypoglycemic Agents    Renal/GU negative Renal ROS     Musculoskeletal  (+) Arthritis ,    Abdominal   Peds  Hematology  (+) Blood dyscrasia, anemia   Anesthesia Other Findings   Reproductive/Obstetrics                             Anesthesia Physical Anesthesia Plan  ASA: 3  Anesthesia Plan: General   Post-op Pain Management: Ofirmev IV (intra-op)*   Induction: Intravenous  PONV Risk Score and Plan: 3 and Dexamethasone, Ondansetron and Treatment may vary due to age or medical condition  Airway Management Planned: Oral ETT  Additional Equipment:   Intra-op Plan:   Post-operative Plan: Extubation in OR  Informed Consent: I have reviewed the patients History and Physical, chart, labs and discussed the procedure including the risks, benefits and alternatives for the proposed anesthesia with the patient or authorized representative who has indicated his/her understanding and acceptance.       Plan Discussed with:   Anesthesia Plan Comments:        Anesthesia Quick Evaluation

## 2022-03-01 NOTE — Op Note (Signed)
03/01/2022  11:30 AM  PATIENT:  Melissa Leon  77 y.o. female  PRE-OPERATIVE DIAGNOSIS: Lumbar epidural abscess  POST-OPERATIVE DIAGNOSIS:  same  PROCEDURE: Left L3-4 L4-5 hemilaminectomy and medial facetectomy with evacuation of epidural abscess  SURGEON:  Sherley Bounds, MD  ASSISTANTS: Glenford Peers, FNP  ANESTHESIA:   General  EBL: 100 ml  Total I/O In: 1600 [I.V.:1600] Out: 850 [Urine:750; Blood:100]  BLOOD ADMINISTERED: none  DRAINS: None  SPECIMEN: Intraoperative cultures  INDICATION FOR PROCEDURE: This patient presented with severe back pain and bilateral leg pain with left leg weakness with a recent UTI and MRSA skin infection at a tube exit site. Imaging showed epidural abscess L3-4 L4-5 with significant spinal stenosis. The patient tried conservative measures without relief. Pain was debilitating. Recommended decompressive hemilaminectomy with evacuation of abscess. Patient understood the risks, benefits, and alternatives and potential outcomes and wished to proceed.  PROCEDURE DETAILS: The patient was taken to the operating room and after induction of adequate generalized endotracheal anesthesia, the patient was rolled into the prone position on the Hornell table and all pressure points were padded. The lumbar region was cleaned and then prepped with DuraPrep and draped in the usual sterile fashion. 5 cc of local anesthesia was injected and then a dorsal midline incision was made and carried down to the lumbo sacral fascia. The fascia was opened and the paraspinous musculature was taken down in a subperiosteal fashion to expose L3-4 and L4-5 on the left (preoperative consent form labeled as L4-5 and L5-S1 as she has a transitional segment at S1-S2 and radiology is reading this is L3-4 and L4-5, so we will relabel to match that of radiology).  There was immediate release of pus over the L3-4 joint on the left and this was cultured.  Intraoperative x-ray confirmed my level,  and then I used a combination of the high-speed drill and the Kerrison punches to perform a hemilaminectomy, medial facetectomy, and foraminotomy at L3-4 and L4-5 on the left. The underlying yellow ligament was opened and removed in a piecemeal fashion to expose the underlying dura and more pus was expressed at both levels.. I undercut the lateral recess and dissected down until I was medial to and distal to the pedicle at L3-4. The nerve root was well decompressed. I then palpated with a coronary dilator along the nerve root and into the foramen and into the midline of to assure adequate decompression.  We saw no more release of pus from any of the spaces, there was pus coming from the L3-4 joint on the left.  I irrigated with saline solution. Achieved hemostasis with bipolar cautery, lined the dura with Gelfoam, placed vancomycin antibiotic beads and then closed the fascia with 0 Vicryl. I closed the subcutaneous tissues with 2-0 Vicryl and the subcuticular tissues with 3-0 Vicryl. The skin was then closed with benzoin and Steri-Strips. The drapes were removed, a sterile dressing was applied.  My nurse practitioner was involved in the exposure, safe retraction of the neural elements, the evacuation of the abscess and the closure. the patient was awakened from general anesthesia and transferred to the recovery room in stable condition. At the end of the procedure all sponge, needle and instrument counts were correct.    PLAN OF CARE: Admit to inpatient   PATIENT DISPOSITION:  PACU - hemodynamically stable.   Delay start of Pharmacological VTE agent (>24hrs) due to surgical blood loss or risk of bleeding:  yes

## 2022-03-01 NOTE — Progress Notes (Signed)
Patient ID: Melissa Leon, female   DOB: 04/10/1945, 77 y.o.   MRN: [id number] Patient seen and examined in the holding room.  She is scheduled for a left L3-4 and possible L4-5 hemilaminectomy evacuation of epidural abscess.  Her left leg is much improved.  It is now 5 out of 5.  She looks comfortable.  She understands the risk of the surgery include but are not limited to bleeding, infection, CSF leak, nerve injury, numbness, weakness, iatrogenic instability, need for further surgery, lack of relief of symptoms, worsening symptoms, and anesthesia risk.  She agrees to proceed

## 2022-03-02 ENCOUNTER — Encounter (HOSPITAL_COMMUNITY): Payer: Self-pay | Admitting: Neurological Surgery

## 2022-03-02 DIAGNOSIS — M48062 Spinal stenosis, lumbar region with neurogenic claudication: Secondary | ICD-10-CM | POA: Diagnosis not present

## 2022-03-02 LAB — GLUCOSE, CAPILLARY
Glucose-Capillary: 209 mg/dL — ABNORMAL HIGH (ref 70–99)
Glucose-Capillary: 228 mg/dL — ABNORMAL HIGH (ref 70–99)
Glucose-Capillary: 300 mg/dL — ABNORMAL HIGH (ref 70–99)
Glucose-Capillary: 213 mg/dL — ABNORMAL HIGH (ref 70–99)
Glucose-Capillary: 174 mg/dL — ABNORMAL HIGH (ref 70–99)

## 2022-03-02 LAB — BASIC METABOLIC PANEL
Anion gap: 12 (ref 5–15)
Anion gap: 14 (ref 5–15)
BUN: 33 mg/dL — ABNORMAL HIGH (ref 8–23)
BUN: 42 mg/dL — ABNORMAL HIGH (ref 8–23)
CO2: 18 mmol/L — ABNORMAL LOW (ref 22–32)
CO2: 16 mmol/L — ABNORMAL LOW (ref 22–32)
Calcium: 9.1 mg/dL (ref 8.9–10.3)
Calcium: 9.4 mg/dL (ref 8.9–10.3)
Chloride: 103 mmol/L (ref 98–111)
Chloride: 102 mmol/L (ref 98–111)
Creatinine, Ser: 0.98 mg/dL (ref 0.44–1.00)
Creatinine, Ser: 1.2 mg/dL — ABNORMAL HIGH (ref 0.44–1.00)
GFR, Estimated: 60 mL/min — ABNORMAL LOW (ref >60)
GFR, Estimated: 47 mL/min — ABNORMAL LOW (ref >60)
Glucose, Bld: 217 mg/dL — ABNORMAL HIGH (ref 70–99)
Glucose, Bld: 262 mg/dL — ABNORMAL HIGH (ref 70–99)
Potassium: 5.4 mmol/L — ABNORMAL HIGH (ref 3.5–5.1)
Potassium: 5 mmol/L (ref 3.5–5.1)
Sodium: 133 mmol/L — ABNORMAL LOW (ref 135–145)
Sodium: 132 mmol/L — ABNORMAL LOW (ref 135–145)

## 2022-03-02 LAB — CBC
HCT: 34.8 % — ABNORMAL LOW (ref 36.0–46.0)
Hemoglobin: 11.4 g/dL — ABNORMAL LOW (ref 12.0–15.0)
MCH: 29.6 pg (ref 26.0–34.0)
MCHC: 32.8 g/dL (ref 30.0–36.0)
MCV: 90.4 fL (ref 80.0–100.0)
Platelets: 271 10*3/uL (ref 150–400)
RBC: 3.85 MIL/uL — ABNORMAL LOW (ref 3.87–5.11)
RDW: 14.3 % (ref 11.5–15.5)
WBC: 13.2 10*3/uL — ABNORMAL HIGH (ref 4.0–10.5)
nRBC: 0 % (ref 0.0–0.2)

## 2022-03-02 LAB — SURGICAL PATHOLOGY

## 2022-03-02 MED ORDER — FLEET ENEMA 7-19 GM/118ML RE ENEM: 1.0000 | ENEMA | Freq: Every day | RECTAL | Status: DC | PRN

## 2022-03-02 MED ORDER — POLYETHYLENE GLYCOL 3350 17 G PO PACK
17.0000 g | PACK | Freq: Every day | ORAL | Status: DC
  Administered 2022-03-02 – 2022-03-03 (×2): 17 g via ORAL
  Filled 2022-03-02 (×2): qty 1

## 2022-03-02 MED ORDER — SODIUM CHLORIDE 0.9 % IV SOLN: INTRAVENOUS | Status: DC

## 2022-03-02 NOTE — Consult Note (Addendum)
[facility]                                                                                       Patient Identification: Patient Name: Melissa Leon MRN: [id number] [place] Date: 02/27/2022  8:09 PM Today's Date: 03/02/2022 Reason for consult: Epidural abscess  Requesting provider: Dr. Avon Gully  Principal Problem:   Spinal stenosis of lumbar region with neurogenic claudication Active Problems:   Pyelonephritis   DM2 (diabetes mellitus, type 2) ([place])   Essential hypertension   Ureteral obstruction, right   S/P lumbar laminectomy   Antibiotics:  Bactrim 2/9-2/10 Vancomycin 2/9, 2/11-C Ceftriaxone 2/10-C Gentamicin 2/9 Meropenem 2/9  Prior abtx course  cephalexin 12/16 x 7d, 12/22 x 10d (pan-S E.coli) Ciprofloxacin 1/22 x 10d Bactrim 1/25 x 10d   Lines/Hardware: Right ureteral stent  Assessment 77 year old female with history of DM with complicated urological history as below with   # Lumbar Epidural abscess, 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 - 2/10 blood cx NG in 2 days  - 2/11 Left L3-4 L4-5 hemilaminectomy and medial facetectomy with evacuation of epidural abscess. OR cx with Staph aureus. OR cx Staph aureus   # DM   Recommendations  Continue current antibiotics ( Vancomycin pharmacy to dose and ceftriaxone ) pending OR cultures to be finalized to see if E coli will grow from OR cultures.  TTE ordered for possible hematogenous source  OK to place PICC once blood cx negative in 72 hrs  Will need 6 to 8 weeks of IV abtx  Fu OR cultures to completion  Monitor CBC and BMP  Fu in ID clinic to be arranged  D/w ID Pharm D and primary   Rest of the management as per the primary team. Please call with questions or concerns.  Thank you  for the consult  Rosiland Oz, MD Infectious Disease Physician [facility] [address] [place],  [address] Phone: [phone]  Fax: [phone]  __________________________________________________________________________________________________________ HPI and Hospital Course: 77 year old female with PMH of DM, HTN, arthritis, anemia, Spinal Stenosis, complicated urologic h/o s/p  right PCN for right UPJ obstruction ( 01/02/22 Urine cx pansensitive E. Coli, 12/18 kideny Cx Pan S E coli)  treated with IV cefazolin followed by cephalexin for 10 days, however worsening rt sided  flank pain/pyelonephritis ( Urine cx 02/09/2022 with >100K MRSA, S Bactrim)  treated with bactrim, s/p 2/9 cystoscopy, rt ureteroscopy with renal pelvis biopsy, retrograde pyelogram, stent placement and TURBT and removal of rt PCN tube after ED visit on 2/7 with worsening back pain and continued on bactrim,  who presented to  ED on 2/9 worsening back pain as well as bilateral leg pain and weakness/difficulty walking/pins and needle sensation in bilateral feet  At ED afebrile Labs remarkable for WBC 18.6, ESR 124, CRP 14.3 2/7 UA unremarkable for UTI CT L and MRI L spine findings as below Patient has bladder retention in ED requiring  foley's placement  ROS: General- Denies fever, chills, loss of appetite and loss of weight HEENT - Denies headache, blurry vision, neck pain, sinus pain Chest - Denies any chest pain, SOB or cough CVS- Denies any dizziness/lightheadedness, syncopal attacks, palpitations Abdomen- Denies any nausea, vomiting, abdominal pain, hematochezia and diarrhea Neuro - weakness in the LE is improving, mild tingling sensation in the bilateral feet  Psych - Denies any changes in mood irritability or depressive symptoms GU- Denies any burning, dysuria, hematuria or increased frequency of urination Skin - denies any rashes/lesions MSK -  mild soreness in the lumbar surgical site   Past Medical History:  Diagnosis Date   Anemia    Arthritis    Depression    Diabetes mellitus ([place])    Hypertension    Pneumonia    Spinal stenosis    Past Surgical History:  Procedure Laterality Date   APPENDECTOMY     CHOLECYSTECTOMY     CYSTOSCOPY WITH RETROGRADE PYELOGRAM, URETEROSCOPY AND STENT PLACEMENT Right 02/27/2022   Procedure: TURBT, CYSTOSCOPY WITH RIGHT RETROGRADE PYELOGRAM, RIGHT URETEROSCOPY, BIOPSY AND STENT PLACEMENT;  Surgeon: Janith Lima, MD;  Location: [facility];  Service: Urology;  Laterality: Right;  30 MINUTES NEEDED FOR CASE   IR NEPHROSTOMY PLACEMENT RIGHT  01/05/2022   LUMBAR LAMINECTOMY FOR EPIDURAL ABSCESS N/A 03/01/2022   Procedure: LUMBAR LAMINECTOMY LUMBAR THREE-FOUR, LUMBAR FOUR- FIVE FOR EPIDURAL ABSCESS;  Surgeon: Eustace Moore, MD;  Location: [facility];  Service: Neurosurgery;  Laterality: N/A;   UTERINE FIBROID SURGERY     WRIST GANGLION EXCISION Right      Scheduled Meds:  acetaminophen  1,000 mg Oral Q6H   amLODipine  5 mg Oral QHS   celecoxib  200 mg Oral Q12H   docusate sodium  100 mg Oral Daily   glimepiride  4 mg Oral Daily   hydrochlorothiazide  25 mg Oral q morning   insulin aspart  0-15 Units Subcutaneous TID WC   losartan  100 mg Oral Daily   metFORMIN  1,000 mg Oral BID WC   mupirocin ointment  1 Application Nasal BID   senna  1 tablet Oral BID   sodium chloride flush  3 mL Intravenous Q12H   Continuous Infusions:  sodium chloride     0.9 % NaCl with KCl 20 mEq / L Stopped (03/01/22 2341)   cefTRIAXone (ROCEPHIN)  IV 1 g (03/02/22 0830)   methocarbamol (ROBAXIN) IV     vancomycin 1,500 mg (03/02/22 0914)   PRN Meds:.menthol-cetylpyridinium **OR** phenol, methocarbamol **OR** methocarbamol (ROBAXIN) IV, morphine, morphine injection, ondansetron **OR** ondansetron (ZOFRAN) IV, mouth rinse, oxyCODONE, sodium chloride flush   Allergies  Allergen Reactions   Choline Fenofibrate Other  (See Comments)    ELEVATED LFT(s)   Penicillins Hives   Ozempic (0.25 Or 0.5 Mg-Dose) [Semaglutide(0.25 Or 0.15m-Dos)] Nausea Only and Other (See Comments)    SEVERE NAUSEA   Statins     Aches, pain   Social History   Socioeconomic History   Marital status: Widowed    Spouse name: Not on file   Number of children: Not on file   Years of education: Not on file   Highest education level: Not on file  Occupational History   Not on file  Tobacco Use   Smoking status: Former    Packs/day: 0.25    Years: 2.00    Total pack years: 0.50    Types: Cigarettes   Smokeless tobacco: Never  Vaping Use   Vaping Use:  Never used  Substance and Sexual Activity   Alcohol use: Not Currently   Drug use: Never   Sexual activity: Yes  Other Topics Concern   Not on file  Social History Narrative   Not on file   Social Determinants of Health   Financial Resource Strain: Not on file  Food Insecurity: No Food Insecurity (02/28/2022)   Hunger Vital Sign    Worried About Running Out of Food in the Last Year: Never true    Ran Out of Food in the Last Year: Never true  Transportation Needs: No Transportation Needs (02/28/2022)   PRAPARE - Hydrologist (Medical): No    Lack of Transportation (Non-Medical): No  Physical Activity: Not on file  Stress: Not on file  Social Connections: Not on file  Intimate Partner Violence: Not At Risk (02/28/2022)   Humiliation, Afraid, Rape, and Kick questionnaire    Fear of Current or Ex-Partner: No    Emotionally Abused: No    Physically Abused: No    Sexually Abused: No   Family History  Problem Relation Age of Onset   Breast cancer Neg Hx    Vitals BP 139/66 (BP Location: Right Arm)   Pulse 70   Temp 98 F (36.7 C) (Oral)   Resp 15   Ht 5' 7.01" (1.702 m)   Wt 79.4 kg   SpO2 100%   BMI 27.40 kg/m    Physical Exam Constitutional: Elderly white female sitting up in the recliner and appears anxious about her  complicated hospitalization     Comments:   Cardiovascular:     Rate and Rhythm: Normal rate and regular rhythm.     Heart sounds: s1s2   Pulmonary:     Effort: Pulmonary effort is normal.     Comments: Normal breath sounds  Abdominal:     Palpations: Abdomen is soft.     Tenderness: Nondistended and nontender  Musculoskeletal:        General: No swelling or tenderness in peripheral joints.  Lower lumbar surgical site with a honeycomb dressing.  No surrounding cellulitis  Skin:    Comments:   Neurological:     General: Awake alert and oriented.  Power in bilateral lower extremities is 4+ out of 5.  Follows commands appropriately    Pertinent Microbiology Results for orders placed or performed during the hospital encounter of 02/27/22  Culture, blood (Routine X 2) w Reflex to ID Panel     Status: None (Preliminary result)   Collection Time: 02/28/22  5:15 PM   Specimen: BLOOD  Result Value Ref Range Status   Specimen Description BLOOD SITE NOT SPECIFIED  Final   Special Requests   Final    BOTTLES DRAWN AEROBIC ONLY Blood Culture adequate volume   Culture   Final    NO GROWTH 2 DAYS Performed at [facility], [place] [address], [place], [place] [address]    Report Status PENDING  Incomplete  Culture, blood (Routine X 2) w Reflex to ID Panel     Status: None (Preliminary result)   Collection Time: 02/28/22  5:32 PM   Specimen: BLOOD  Result Value Ref Range Status   Specimen Description BLOOD SITE NOT SPECIFIED  Final   Special Requests   Final    BOTTLES DRAWN AEROBIC AND ANAEROBIC Blood Culture results may not be optimal due to an excessive volume of blood received in culture bottles   Culture   Final  NO GROWTH 2 DAYS Performed at [facility], [place] [address], [place], [place] [address]    Report Status PENDING  Incomplete  Surgical pcr screen     Status: Abnormal   Collection Time: 03/01/22  9:22 AM   Specimen: Nasal Mucosa; Nasal Swab  Result Value  Ref Range Status   MRSA, PCR POSITIVE (A) NEGATIVE Final    Comment: RESULT CALLED TO, READ BACK BY AND VERIFIED WITH:  Arnell Sieving, RN 03/01/22 1127 A. LAFRANCE    Staphylococcus aureus POSITIVE (A) NEGATIVE Final    Comment: (NOTE) The Xpert SA Assay (FDA approved for NASAL specimens in patients 22 years of age and older), is one component of a comprehensive surveillance program. It is not intended to diagnose infection nor to guide or monitor treatment. Performed at [facility], [place] [address] [place], [place] [address]   Aerobic/Anaerobic Culture w Gram Stain (surgical/deep wound)     Status: None (Preliminary result)   Collection Time: 03/01/22 10:25 AM   Specimen: Abscess  Result Value Ref Range Status   Specimen Description ABSCESS  Final   Special Requests LUMBAR EPIDURAL  Final   Gram Stain   Final    MODERATE WBC PRESENT, PREDOMINANTLY PMN FEW GRAM POSITIVE COCCI IN SINGLES IN PAIRS    Culture   Final    MODERATE STAPHYLOCOCCUS AUREUS SUSCEPTIBILITIES TO FOLLOW Performed at [facility], [place] [address] [place], [place] [address]    Report Status PENDING  Incomplete   Pertinent Lab seen by me:    Latest Ref Rng & Units 03/02/2022    7:12 AM 02/27/2022   10:06 PM 02/25/2022    9:40 AM  CBC  WBC 4.0 - 10.5 K/uL 13.2  18.6  18.6   Hemoglobin 12.0 - 15.0 g/dL 11.4  10.8  12.6   Hematocrit 36.0 - 46.0 % 34.8  32.5  39.6   Platelets 150 - 400 K/uL 271  355  552       Latest Ref Rng & Units 03/02/2022    7:12 AM 02/27/2022    9:48 PM 02/25/2022    9:40 AM  CMP  Glucose 70 - 99 mg/dL 217  284  187   BUN 8 - 23 mg/dL 33  24  26   Creatinine 0.44 - 1.00 mg/dL 0.98  0.82  0.79   Sodium 135 - 145 mmol/L 133  130  134   Potassium 3.5 - 5.1 mmol/L 5.4  4.7  3.9   Chloride 98 - 111 mmol/L 103  100  98   CO2 22 - 32 mmol/L 18  23  23   $ Calcium 8.9 - 10.3 mg/dL 9.1  9.1  9.2   Total Protein 6.5 - 8.1 g/dL  7.1  7.8   Total Bilirubin 0.3 - 1.2 mg/dL  1.0  0.5    Alkaline Phos 38 - 126 U/L  201  114   AST 15 - 41 U/L  38  19   ALT 0 - 44 U/L  108  30      Pertinent Imagings/Other Imagings Plain films and CT images have been personally visualized and interpreted; radiology reports have been reviewed. Decision making incorporated into the Impression / Recommendations.  DG Lumbar Spine 2-3 Views  Result Date: 03/01/2022 CLINICAL DATA:  Spine localization EXAM: LUMBAR SPINE - 2-3 VIEW COMPARISON:  02/28/2022 FINDINGS: Two cross-table lateral radiographs of the lumbar spine were obtained intraoperatively, both of which demonstrate surgical  instrumentation posterior to the L3-L4 disc space. Please note there is transitional lumbosacral anatomy with sacralization of the L5 segment, as described on previous MRI. IMPRESSION: Surgical instrumentation posterior to the L3-L4 disc space. Electronically Signed   By: Davina Poke D.O.   On: 03/01/2022 13:35   MR Lumbar Spine W Wo Contrast  Result Date: 02/28/2022 CLINICAL DATA:  Spinal stenosis, lumbar neurogenic claudication. Leukocytosis. EXAM: MRI LUMBAR SPINE WITHOUT AND WITH CONTRAST TECHNIQUE: Multiplanar and multiecho pulse sequences of the lumbar spine were obtained without and with intravenous contrast. CONTRAST:  7.91m GADAVIST GADOBUTROL 1 MMOL/ML IV SOLN COMPARISON:  CT 02/27/2022 FINDINGS: Segmentation: Transitional lumbosacral anatomy with partial sacralization of the L5 segment. Incompletely developed disc space at the L5-S1 level. Lowest well developed disc space is labeled as L4-L5. The lowest rib-bearing segment is labeled as T12 (corresponding to axial series 6, image 6). Alignment: Grade 1 anterolisthesis of L3 on L4. Minimal anterolisthesis of L4 on L5. Vertebrae: No acute fracture. No evidence of discitis. Bilateral facet joint effusions at L3-L4 concerning for septic arthritis, particularly on the left (series 4, image 11). Mild subchondral marrow edema which may be reactive. Early changes of  osteomyelitis are not excluded. Benign intraosseous hemangiomas most notably within the L4 and L1 vertebral bodies. No suspicious marrow replacing bone lesion. Conus medullaris and cauda equina: Conus extends to the T12-L1 level. Conus and cauda equina appear normal. Rim enhancing fluid collection within the posterior epidural space spanning from the level of the mid L3 vertebral body to the L5-S1 disc space (series 8, image 9). Approximate dimensions of 1.1 x 0.9 cm transaxially and extending craniocaudally 7.8 cm. Paraspinal and other soft tissues: Lower posterior paraspinal muscle edema. There is a small abscess within the soft tissues adjacent to the L4 spinous process on the left, which appears to be arising from the inferior margin of the left L3-L4 facet joint measuring approximately 2.4 x 1.9 x 2.1 cm (series 8, image 11). Fluid collection versus complex synovial cyst posterior to the right L3-L4 facet joint measuring 1.8 x 1.1 x 1.1 cm (series 6, image 31). No inflammatory changes or fluid collections within the psoas musculature. Disc levels: T12-L1: Mild facet arthropathy.  No foraminal or canal stenosis. L1-L2: Mild facet arthropathy.  No foraminal or canal stenosis. L2-L3: Mild annular disc bulge with mild-to-moderate bilateral facet arthropathy and ligamentum flavum buckling. Mild-to-moderate canal stenosis. No significant foraminal stenosis. L3-L4: Anterolisthesis with disc uncovering and diffuse disc bulge. Severe bilateral facet arthropathy and ligamentum flavum buckling. Posterior epidural space collection. Findings contribute to severe canal stenosis with severe bilateral subarticular recess stenosis and moderate bilateral foraminal stenosis. L4-L5: Broad right foraminal to far lateral disc protrusion. Severe bilateral facet arthropathy with ligamentum flavum buckling. Fluid collection in the posterior epidural space. Findings contribute to moderate canal stenosis with right worse than left  subarticular recess stenosis. Moderate right and mild left foraminal stenosis. L5-S1: Transitional level.  No impingement. IMPRESSION: 1. 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 2. Bilateral facet joint effusions at L3-L4 concerning for septic arthritis, particularly on the left. Mild subchondral marrow edema which may be reactive. Early changes of osteomyelitis are not excluded. 3. 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 Urgent Neurosurgical evaluation recommended. 4. Small abscess within the posterior paraspinal soft tissues likely arising from the left L3-L4 facet joint measuring approximately 2.4 x 1.9 x 2.1 cm. 5. Fluid collection versus complex  synovial cyst posterior to the right L3-L4 facet joint measuring 1.8 x 1.1 x 1.1 cm. 6. Severe canal stenosis with severe bilateral subarticular recess stenosis and moderate bilateral foraminal stenosis at L3-L4. Canal stenosis is in part secondary to the epidural abscess at this level. 7. Moderate canal stenosis at L4-L5 with moderate right and mild left foraminal stenosis. 8. Mild-to-moderate canal stenosis at L2-L3. 9. No findings to suggest discitis at any level within the lumbar spine. These results were called by telephone at the time of interpretation on 02/28/2022 at 5:08 pm to provider Little Ishikawa, MD, who verbally acknowledged these results. Per Dr. Avon Gully, Neurosurgery is also aware of the above findings. Electronically Signed   By: Davina Poke D.O.   On: 02/28/2022 17:10   CT LUMBAR SPINE W CONTRAST  Result Date: 02/27/2022 CLINICAL DATA:  Low back pain EXAM: CT LUMBAR SPINE WITH CONTRAST TECHNIQUE: Multidetector CT imaging of the lumbar spine was performed with intravenous contrast administration. RADIATION DOSE REDUCTION: This exam was performed according to the departmental dose-optimization program which includes automated exposure control,  adjustment of the mA and/or kV according to patient size and/or use of iterative reconstruction technique. CONTRAST:  151m OMNIPAQUE IOHEXOL 300 MG/ML  SOLN COMPARISON:  Renal stone CT 12/26/2022. FINDINGS: Segmentation: 5 lumbar type vertebrae. Alignment: There is 2 mm of anterolisthesis at L3-L4. Alignment is otherwise anatomic. Vertebrae: Bones are osteopenic. There is no acute fracture or focal osseous lesion. Paraspinal and other soft tissues: Paraspinal soft tissues are within normal limits. The bladder is distended. Right ureteral stent is present. There is severe right-sided hydronephrosis in the small amount of air in the right renal collecting system. There are severe atherosclerotic calcifications of the aorta. Disc levels: Mild disc space narrowing and endplate osteophyte formation is seen at L2-L3 and L4-L5 compatible with degenerative change. L2-L3 there is broad-based disc bulge, bilateral facet arthropathy and thickening of the ligamentum flavum causing moderate central canal stenosis. No neural foraminal stenosis. At L3-L4 there is broad-based disc bulge with thickening of the ligamentum flavum and bilateral facet arthropathy with severe central canal stenosis. There is mild right neural foraminal stenosis. At L4-L5 there is thickening of the ligamentum flavum and bilateral facet arthropathy with broad-based disc bulge causing mild central canal stenosis. IMPRESSION: 1. No acute fracture or traumatic subluxation of the lumbar spine. 2. Multilevel degenerative changes of the lumbar spine with severe central canal stenosis at L3-L4 and moderate central canal stenosis at L2-L3. 3. New right ureteral stent with new severe right-sided hydronephrosis and small amount of air in the right renal collecting system. Aortic Atherosclerosis (ICD10-I70.0). Electronically Signed   By: ARonney AstersM.D.   On: 02/27/2022 23:26   DG C-Arm 1-60 Min-No Report  Result Date: 02/27/2022 Fluoroscopy was utilized by  the requesting physician.  No radiographic interpretation.   CT Renal Stone Study  Result Date: 02/25/2022 CLINICAL DATA:  Abdominal and flank pain, has cystoscopy with nephrostomy tube placement on RIGHT side for biopsy last week. Complaining of LEFT side back and flank pain making it difficult to walk EXAM: CT ABDOMEN AND PELVIS WITHOUT CONTRAST TECHNIQUE: Multidetector CT imaging of the abdomen and pelvis was performed following the standard protocol without IV contrast. RADIATION DOSE REDUCTION: This exam was performed according to the departmental dose-optimization program which includes automated exposure control, adjustment of the mA and/or kV according to patient size and/or use of iterative reconstruction technique. COMPARISON:  01/04/2022 FINDINGS: Lower chest: Small bibasilar pulmonary nodules, 3 mm  LEFT lower lobe image 7, cavitary nodule RIGHT lower lobe 5 mm image 9, 4 mm RIGHT lower lobe image 18. Nodule versus atelectasis at base of RIGHT major fissure 10 mm image 24. These nodules are essentially unchanged. Hepatobiliary: Gallbladder surgically absent. Biliary dilatation similar to prior exam. No focal hepatic abnormality. Pancreas: Normal appearance Spleen: Normal appearance Adrenals/Urinary Tract: Adrenal glands normal appearance. Nephrostomy tube coiled in decompressed RIGHT renal pelvis. 11 mm LEFT renal cyst unchanged image 37. No definite urinary tract calcification or dilatation. Excreted contrast within bladder. Ureters unremarkable. Stomach/Bowel: Scattered colonic diverticulosis and stool. No CT evidence of diverticulitis. Stomach decompressed, with inadequate assessment of gastric wall thickness. Small bowel loops unremarkable. Duodenal diverticulum noted. Vascular/Lymphatic: Atherosclerotic calcifications aorta, iliac arteries, coronary arteries, femoral arteries. Aorta normal caliber. Few pelvic phleboliths. No adenopathy. Reproductive: Unremarkable uterus and ovaries Other: No free  air or free fluid. No hernia or inflammatory process. Musculoskeletal: Osseous demineralization. IMPRESSION: Nephrostomy tube coiled in decompressed RIGHT renal pelvis. Scattered colonic diverticulosis and stool without evidence of diverticulitis. Stable bibasilar pulmonary nodules. No acute intra-abdominal or intrapelvic abnormalities. Aortic Atherosclerosis (ICD10-I70.0). Electronically Signed   By: Lavonia Dana M.D.   On: 02/25/2022 11:07     I spent 85 minutes for this patient encounter including review of prior medical records/discussing diagnostics and treatment plan with the patient/family/coordinate care with primary/other specialits with greater than 50% of time in face to face encounter.   Electronically signed by:   Rosiland Oz, MD Infectious Disease Physician [facility] Pager: [phone]

## 2022-03-02 NOTE — Plan of Care (Signed)
  Problem: Education: Goal: Knowledge of General Education information will improve Description: Including pain rating scale, medication(s)/side effects and non-pharmacologic comfort measures Outcome: Not Progressing   Problem: Health Behavior/Discharge Planning: Goal: Ability to manage health-related needs will improve Outcome: Not Progressing   Problem: Clinical Measurements: Goal: Ability to maintain clinical measurements within normal limits will improve Outcome: Not Progressing Goal: Will remain free from infection Outcome: Not Progressing Goal: Diagnostic test results will improve Outcome: Not Progressing Goal: Respiratory complications will improve Outcome: Not Progressing Goal: Cardiovascular complication will be avoided Outcome: Not Progressing   Problem: Activity: Goal: Risk for activity intolerance will decrease Outcome: Not Progressing   Problem: Nutrition: Goal: Adequate nutrition will be maintained Outcome: Not Progressing   Problem: Coping: Goal: Level of anxiety will decrease Outcome: Not Progressing   Problem: Elimination: Goal: Will not experience complications related to bowel motility Outcome: Not Progressing Goal: Will not experience complications related to urinary retention Outcome: Not Progressing   Problem: Pain Managment: Goal: General experience of comfort will improve Outcome: Not Progressing   Problem: Safety: Goal: Ability to remain free from injury will improve Outcome: Not Progressing   Problem: Skin Integrity: Goal: Risk for impaired skin integrity will decrease Outcome: Not Progressing   Problem: Education: Goal: Ability to describe self-care measures that may prevent or decrease complications (Diabetes Survival Skills Education) will improve Outcome: Not Progressing Goal: Individualized Educational Video(s) Outcome: Not Progressing   Problem: Coping: Goal: Ability to adjust to condition or change in health will  improve Outcome: Not Progressing   Problem: Fluid Volume: Goal: Ability to maintain a balanced intake and output will improve Outcome: Not Progressing   Problem: Health Behavior/Discharge Planning: Goal: Ability to identify and utilize available resources and services will improve Outcome: Not Progressing Goal: Ability to manage health-related needs will improve Outcome: Not Progressing   Problem: Metabolic: Goal: Ability to maintain appropriate glucose levels will improve Outcome: Not Progressing   Problem: Nutritional: Goal: Maintenance of adequate nutrition will improve Outcome: Not Progressing Goal: Progress toward achieving an optimal weight will improve Outcome: Not Progressing   Problem: Skin Integrity: Goal: Risk for impaired skin integrity will decrease Outcome: Not Progressing   Problem: Tissue Perfusion: Goal: Adequacy of tissue perfusion will improve Outcome: Not Progressing   Problem: Education: Goal: Ability to verbalize activity precautions or restrictions will improve Outcome: Not Progressing Goal: Knowledge of the prescribed therapeutic regimen will improve Outcome: Not Progressing Goal: Understanding of discharge needs will improve Outcome: Not Progressing   Problem: Activity: Goal: Ability to avoid complications of mobility impairment will improve Outcome: Not Progressing Goal: Ability to tolerate increased activity will improve Outcome: Not Progressing Goal: Will remain free from falls Outcome: Not Progressing   Problem: Bowel/Gastric: Goal: Gastrointestinal status for postoperative course will improve Outcome: Not Progressing   Problem: Clinical Measurements: Goal: Ability to maintain clinical measurements within normal limits will improve Outcome: Not Progressing Goal: Postoperative complications will be avoided or minimized Outcome: Not Progressing Goal: Diagnostic test results will improve Outcome: Not Progressing   Problem: Pain  Management: Goal: Pain level will decrease Outcome: Not Progressing   Problem: Skin Integrity: Goal: Will show signs of wound healing Outcome: Not Progressing   Problem: Health Behavior/Discharge Planning: Goal: Identification of resources available to assist in meeting health care needs will improve Outcome: Not Progressing   Problem: Bladder/Genitourinary: Goal: Urinary functional status for postoperative course will improve Outcome: Not Progressing

## 2022-03-02 NOTE — TOC Initial Note (Signed)
Transition of Care [facility]) - Initial/Assessment Note    Patient Details  Name: Melissa Leon MRN: [id number] Date of Birth: 1945/09/22  Transition of Care [facility]) CM/SW Contact:    Pollie Friar, RN Phone Number: 03/02/2022, 2:24 PM  Clinical Narrative:                 Pt is from home alone. CM met with the patient and she put her daughter on over speaker phone. According to patient she will be going home with IV abx once they have determined the antibiotic needed.  Pt's daughter states that family is going to come together to provide needed support at patients home.  Family can provide needed transportation. Pt manages her own medications and denies any issues.  Home health recommended and family/ pt are interested. They asked to use [facility]. Alvis Lemmings has accepted the referral. Information on the AVS. They are aware of potential IV abx at home.  Cm has also updated Pam with [employer] about potential IV abx at home. She will follow and educate family as needed.  Family will transport home when medically ready.   Expected Discharge Plan: [place] Barriers to Discharge: Continued Medical Work up   Patient Goals and CMS Choice   CMS Medicare.gov Compare Post Acute Care list provided to:: Patient Choice offered to / list presented to : Patient, Adult Children      Expected Discharge Plan and Services   Discharge Planning Services: CM Consult Post Acute Care Choice: [place] arrangements for the past 2 months: [place] Arranged: RN, PT, OT [facility] Agency: [place] [facility] Agency Contacted: 03/02/22   Representative spoke with at [place]: Tommi Rumps  Prior Living Arrangements/Services Living arrangements for the past 2 months: [place] Lives with:: Self Patient language and need for interpreter reviewed:: Yes Do you feel safe going back to the place where you live?: Yes      Need for Family  Participation in Patient Care: Yes (Comment) Care giver support system in place?: Yes (comment) Current home services: DME (cane/walker/ shower seat) Criminal Activity/Legal Involvement Pertinent to Current Situation/Hospitalization: No - Comment as needed  Activities of Daily Living Home Assistive Devices/Equipment: Cane (specify quad or straight), Wheelchair, Environmental consultant (specify type) ADL Screening (condition at time of admission) Patient's cognitive ability adequate to safely complete daily activities?: Yes Is the patient deaf or have difficulty hearing?: No Does the patient have difficulty seeing, even when wearing glasses/contacts?: No Does the patient have difficulty concentrating, remembering, or making decisions?: No Patient able to express need for assistance with ADLs?: Yes Does the patient have difficulty dressing or bathing?: No Independently performs ADLs?: Yes (appropriate for developmental age) Does the patient have difficulty walking or climbing stairs?: Yes Weakness of Legs: Both Weakness of Arms/Hands: Both  Permission Sought/Granted                  Emotional Assessment Appearance:: Appears stated age Attitude/Demeanor/Rapport: Engaged Affect (typically observed): Accepting Orientation: : Oriented to Self, Oriented to Place, Oriented to  Time, Oriented to Situation   Psych Involvement: No (comment)  Admission diagnosis:  Spinal stenosis of lumbar region with neurogenic claudication [M48.062] Back pain, unspecified back location, unspecified back pain laterality, unspecified chronicity [M54.9] 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] S/P lumbar  laminectomy [id number] Patient Active Problem List   Diagnosis Date Noted   S/P lumbar laminectomy 03/01/2022   Ureteral obstruction, right 02/28/2022   Spinal stenosis of lumbar region with neurogenic claudication 02/28/2022   Pyelonephritis 01/04/2022   Severe sepsis ([place]) 01/04/2022   Nausea 01/04/2022   Acute  right flank pain 01/04/2022   Hypomagnesemia 01/04/2022   Acute hyponatremia 01/04/2022   DM2 (diabetes mellitus, type 2) ([place]) 01/04/2022   Essential hypertension 01/04/2022   PCP:  Katherina Mires, MD Pharmacy:   [facility] [place], [place] [address] Phone: [phone] Fax: [phone]     Social Determinants of Health (SDOH) Social History: SDOH Screenings   Food Insecurity: No Food Insecurity (02/28/2022)  Housing: Low Risk  (02/28/2022)  Transportation Needs: No Transportation Needs (02/28/2022)  Utilities: Not At Risk (02/28/2022)  Tobacco Use: Medium Risk (03/02/2022)   SDOH Interventions:     Readmission Risk Interventions    01/06/2022    9:26 AM  Readmission Risk Prevention Plan  Transportation Screening Complete  PCP or Specialist Appt within 5-7 Days Complete  Home Care Screening Complete  Medication Review (RN CM) Complete

## 2022-03-02 NOTE — Evaluation (Signed)
Occupational Therapy Evaluation Patient Details Name: Melissa Leon MRN: [id number] DOB: 10-18-1945 Today's Date: 03/02/2022   History of Present Illness Pt is a 77 y/o presented to [facility] ED 02/28/22 with back pain and inability to walk or void urine. 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  PMH Pt with recent h/x right ureteral obstruction with hydronephrosis since December '23. She has had a stormy course: She required a percutaneous nephrostomy tube right. When her infection cleared she was able to have cystoscopy, ureteroscopy and ureteral stent placement right on 02/27/22.   Clinical Impression   Patient admitted for above and limited by problem list below.  She reports prior to December she was independent, driving; needing some assist since complicated process with nephrostomy tube.  Currently, pt completing ADLs with up to min assist, transfers with min to min guard assist and functional mobility in room using RW with min guard.  Educated pt on back precautions, ADL compensatory techniques, safety, DME and recommendations. Recommend HHOT at dc to optimize independence, safety and return to PLOF. Will follow acutely.      Recommendations for follow up therapy are one component of a multi-disciplinary discharge planning process, led by the attending physician.  Recommendations may be updated based on patient status, additional functional criteria and insurance authorization.   Follow Up Recommendations  Home health OT     Assistance Recommended at Discharge Intermittent Supervision/Assistance  Patient can return home with the following A little help with walking and/or transfers;A little help with bathing/dressing/bathroom;Assistance with cooking/housework;Assist for transportation;Help with stairs or ramp for entrance    Functional Status Assessment  Patient has  had a recent decline in their functional status and demonstrates the ability to make significant improvements in function in a reasonable and predictable amount of time.  Equipment Recommendations  None recommended by OT    Recommendations for Other Services       Precautions / Restrictions Precautions Precautions: Back;Fall Precaution Booklet Issued: Yes (comment) Precaution Comments: educated on back precautions Required Braces or Orthoses:  (no brace per orders) Restrictions Weight Bearing Restrictions: No      Mobility Bed Mobility               General bed mobility comments: OOB in recliner    Transfers Overall transfer level: Needs assistance Equipment used: Rolling walker (2 wheels) Transfers: Sit to/from Stand Sit to Stand: Min guard, Min assist           General transfer comment: min assist to power up from recliner, min guard from 3:1 commode over toilet.  Good recall for hand placement      Balance Overall balance assessment: Mild deficits observed, not formally tested                                         ADL either performed or assessed with clinical judgement   ADL Overall ADL's : Needs assistance/impaired     Grooming: Set up;Sitting           Upper Body Dressing : Set up;Sitting   Lower Body Dressing: Minimal assistance;Sit to/from stand   Toilet Transfer: Minimal assistance;Ambulation;Rolling walker (2 wheels);BSC/3in1 Toilet Transfer Details (indicate cue type and reason): 3:1 over commode         Functional mobility during ADLs: Min guard;Rolling  walker (2 wheels)       Vision   Vision Assessment?: No apparent visual deficits     Perception     Praxis      Pertinent Vitals/Pain Pain Assessment Pain Assessment: 0-10 Pain Score: 1  Pain Location: back- incisional Pain Descriptors / Indicators: Operative site guarding Pain Intervention(s): Limited activity within patient's tolerance, Monitored  during session, Repositioned     Hand Dominance Right   Extremity/Trunk Assessment Upper Extremity Assessment Upper Extremity Assessment: Generalized weakness   Lower Extremity Assessment Lower Extremity Assessment: Defer to PT evaluation   Cervical / Trunk Assessment Cervical / Trunk Assessment: Back Surgery   Communication Communication Communication: No difficulties   Cognition Arousal/Alertness: Awake/alert Behavior During Therapy: WFL for tasks assessed/performed Overall Cognitive Status: Within Functional Limits for tasks assessed                                       General Comments       Exercises     Shoulder Instructions      Home Living Family/patient expects to be discharged to:: Private residence Living Arrangements: Alone Available Help at Discharge: Family;Available 24 hours/day (children arranging 24/7; eventually brother coming to provide 24/7) Type of Home: House Home Access: Stairs to enter [employer] of Steps: 1   Home Layout: One level     Bathroom Shower/Tub: Teacher, early years/pre: [place]: [place] - single Barista (2 wheels);Wheelchair - manual;BSC/3in1;Shower seat;Grab bars - toilet;Grab bars - tub/shower          Prior Functioning/Environment Prior Level of Function : Independent/Modified Independent             Mobility Comments: prior to nephrostomy tube, going to gym          OT Problem List: Decreased strength;Decreased activity tolerance;Impaired balance (sitting and/or standing);Decreased knowledge of use of DME or AE;Decreased knowledge of precautions      OT Treatment/Interventions: Self-care/ADL training;Therapeutic exercise;DME and/or AE instruction;Therapeutic activities;Patient/family education;Balance training    OT Goals(Current goals can be found in the care plan section) Acute Rehab OT Goals Patient Stated Goal: get better, get  home OT Goal Formulation: With patient Time For Goal Achievement: 03/16/22 Potential to Achieve Goals: Good  OT Frequency: Min 2X/week    Co-evaluation              AM-PAC OT "6 Clicks" Daily Activity     Outcome Measure Help from another person eating meals?: None Help from another person taking care of personal grooming?: A Little Help from another person toileting, which includes using toliet, bedpan, or urinal?: A Little Help from another person bathing (including washing, rinsing, drying)?: A Little Help from another person to put on and taking off regular upper body clothing?: A Little Help from another person to put on and taking off regular lower body clothing?: A Little 6 Click Score: 19   End of Session Equipment Utilized During Treatment: Rolling walker (2 wheels) Nurse Communication: Mobility status  Activity Tolerance: Patient tolerated treatment well Patient left: in chair;with call bell/phone within reach;with chair alarm set  OT Visit Diagnosis: Other abnormalities of gait and mobility (R26.89);Muscle weakness (generalized) (M62.81)                Time: [id number] OT Time Calculation (min): 18 min Charges:  OT General Charges $OT Visit: 1 Visit  OT Evaluation $OT Eval Moderate Complexity: 1 Mod  Melissa Leon, OT Acute Rehabilitation Services Office [phone]   Delight Stare 03/02/2022, 1:39 PM

## 2022-03-02 NOTE — Progress Notes (Signed)
Patient ID: Melissa Leon, female   DOB: 02-20-1945, 77 y.o.   MRN: [id number] Subjective: Patient reports no pain,still some tingling in feet  Objective: Vital signs in last 24 hours: Temp:  [97.6 F (36.4 C)-98.5 F (36.9 C)] 97.7 F (36.5 C) (02/12 0723) Pulse Rate:  [78-89] 82 (02/12 0723) Resp:  [12-18] 15 (02/12 0723) BP: (159-182)/(73-89) 182/76 (02/12 0723) SpO2:  [97 %-100 %] 99 % (02/12 0723) Weight:  [79.4 kg] 79.4 kg (02/11 0856)  Intake/Output from previous day: 02/11 0701 - 02/12 0700 In: 1603 [I.V.:1603] Out: 4750 [Urine:4650; Blood:100] Intake/Output this shift: No intake/output data recorded.  Neurologic: Grossly normal to in bed exam, good DF/PF, lifts legs off bed easily  Lab Results: Lab Results  Component Value Date   WBC 13.2 (H) 03/02/2022   HGB 11.4 (L) 03/02/2022   HCT 34.8 (L) 03/02/2022   MCV 90.4 03/02/2022   PLT 271 03/02/2022   Lab Results  Component Value Date   INR 1.2 01/05/2022   BMET Lab Results  Component Value Date   NA 130 (L) 02/27/2022   K 4.7 02/27/2022   CL 100 02/27/2022   CO2 23 02/27/2022   GLUCOSE 284 (H) 02/27/2022   BUN 24 (H) 02/27/2022   CREATININE 0.82 02/27/2022   CALCIUM 9.1 02/27/2022    Studies/Results: DG Lumbar Spine 2-3 Views  Result Date: 03/01/2022 CLINICAL DATA:  Spine localization EXAM: LUMBAR SPINE - 2-3 VIEW COMPARISON:  02/28/2022 FINDINGS: Two cross-table lateral radiographs of the lumbar spine were obtained intraoperatively, both of which demonstrate surgical instrumentation posterior to the L3-L4 disc space. Please note there is transitional lumbosacral anatomy with sacralization of the L5 segment, as described on previous MRI. IMPRESSION: Surgical instrumentation posterior to the L3-L4 disc space. Electronically Signed   By: Davina Poke D.O.   On: 03/01/2022 13:35   MR Lumbar Spine W Wo Contrast  Result Date: 02/28/2022 CLINICAL DATA:  Spinal stenosis, lumbar neurogenic claudication.  Leukocytosis. EXAM: MRI LUMBAR SPINE WITHOUT AND WITH CONTRAST TECHNIQUE: Multiplanar and multiecho pulse sequences of the lumbar spine were obtained without and with intravenous contrast. 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 Incompletely developed disc space at the L5-S1 level. Lowest well developed disc space is labeled as L4-L5. The lowest rib-bearing segment is labeled as T12 (corresponding to axial series 6, image 6). Alignment: Grade 1 anterolisthesis of L3 on L4. Minimal anterolisthesis of L4 on L5. Vertebrae: No acute fracture. No evidence of discitis. Bilateral facet joint effusions at L3-L4 concerning for septic arthritis, particularly on the left (series 4, image 11). Mild subchondral marrow edema which may be reactive. Early changes of osteomyelitis are not excluded. Benign intraosseous hemangiomas most notably within the L4 and L1 vertebral bodies. No suspicious marrow replacing bone lesion. Conus medullaris and cauda equina: Conus extends to the T12-L1 level. Conus and cauda equina appear normal. Rim enhancing fluid collection within the posterior epidural space spanning from the level of the mid L3 vertebral body to the L5-S1 disc space (series 8, image 9). Approximate dimensions of 1.1 x 0.9 cm transaxially and extending craniocaudally 7.8 cm. Paraspinal and other soft tissues: Lower posterior paraspinal muscle edema. There is a small abscess within the soft tissues adjacent to the L4 spinous process on the left, which appears to be arising from the inferior margin of the left L3-L4 facet joint measuring approximately 2.4 x 1.9 x  2.1 cm (series 8, image 11). Fluid collection versus complex synovial cyst posterior to the right L3-L4 facet joint measuring 1.8 x 1.1 x 1.1 cm (series 6, image 31). No inflammatory changes or fluid collections within the psoas  musculature. Disc levels: T12-L1: Mild facet arthropathy.  No foraminal or canal stenosis. L1-L2: Mild facet arthropathy.  No foraminal or canal stenosis. L2-L3: Mild annular disc bulge with mild-to-moderate bilateral facet arthropathy and ligamentum flavum buckling. Mild-to-moderate canal stenosis. No significant foraminal stenosis. L3-L4: Anterolisthesis with disc uncovering and diffuse disc bulge. Severe bilateral facet arthropathy and ligamentum flavum buckling. Posterior epidural space collection. Findings contribute to severe canal stenosis with severe bilateral subarticular recess stenosis and moderate bilateral foraminal stenosis. L4-L5: Broad right foraminal to far lateral disc protrusion. Severe bilateral facet arthropathy with ligamentum flavum buckling. Fluid collection in the posterior epidural space. Findings contribute to moderate canal stenosis with right worse than left subarticular recess stenosis. Moderate right and mild left foraminal stenosis. L5-S1: Transitional level.  No impingement. IMPRESSION: 1. 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 2. Bilateral facet joint effusions at L3-L4 concerning for septic arthritis, particularly on the left. Mild subchondral marrow edema which may be reactive. Early changes of osteomyelitis are not excluded. 3. 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 Urgent Neurosurgical evaluation recommended. 4. Small abscess within the posterior paraspinal soft tissues likely arising from the left L3-L4 facet joint measuring approximately 2.4 x 1.9 x 2.1 cm. 5. Fluid collection versus complex synovial cyst posterior to the right L3-L4 facet joint measuring 1.8 x 1.1 x 1.1 cm. 6. Severe canal stenosis with severe bilateral subarticular recess stenosis and moderate bilateral foraminal stenosis at L3-L4. Canal stenosis is in part secondary to the epidural abscess at this level. 7.  Moderate canal stenosis at L4-L5 with moderate right and mild left foraminal stenosis. 8. Mild-to-moderate canal stenosis at L2-L3. 9. No findings to suggest discitis at any level within the lumbar spine. These results were called by telephone at the time of interpretation on 02/28/2022 at 5:08 pm to provider Little Ishikawa, MD, who verbally acknowledged these results. Per Dr. Avon Gully, Neurosurgery is also aware of the above findings. Electronically Signed   By: Davina Poke D.O.   On: 02/28/2022 17:10    Assessment/Plan: Doing well  Rec ID consult  Will need PICC line  Continue abx. Check cultures  PT/OT  Estimated body mass index is 27.4 kg/m as calculated from the following:   Height as of this encounter: 5' 7.01" (1.702 m).   Weight as of this encounter: 79.4 kg.    LOS: 2 days    Eustace Moore 03/02/2022, 7:45 AM

## 2022-03-02 NOTE — Evaluation (Signed)
Physical Therapy Evaluation Patient Details Name: Melissa Leon MRN: [id number] DOB: [birth year] Today's Date: 03/02/2022  History of Present Illness  77 y/o presented to [facility] ED 02/28/22 with back pain and inability to walk or void urine. 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  PMH Pt with recent h/x right ureteral obstruction with hydronephrosis since December '23. She has had a stormy course: She required a percutaneous nephrostomy tube right. When her infection cleared she was able to have cystoscopy, ureteroscopy and ureteral stent placement right on 02/27/22.  Clinical Impression   Pt admitted secondary to problem above with deficits below. PTA patient was dealing with multiple issues with nephrostomy tube, and prior to that she was living alone and independent. As she began to have back pain and LE weakness, her daughter's obtained RW, wheelchair, BSC, shower seat, but pt was barely home long enough to learn to use these items.  Pt currently requires min assist for basic mobility including gait up to 25 ft with RW. Daughter present and reports she is working with her siblings to provide 24 hour care for pt on discharge (until pt's brother arrives and will be with her 24/7).  Anticipate patient will benefit from PT to address problems listed below.Will continue to follow acutely to maximize functional mobility independence and safety.          Recommendations for follow up therapy are one component of a multi-disciplinary discharge planning process, led by the attending physician.  Recommendations may be updated based on patient status, additional functional criteria and insurance authorization.  Follow Up Recommendations Home health PT      Assistance Recommended at Discharge Frequent or constant Supervision/Assistance  Patient can return home with the following   A little help with walking and/or transfers;A little help with bathing/dressing/bathroom;Assistance with cooking/housework;Assist for transportation;Help with stairs or ramp for entrance    Equipment Recommendations None recommended by PT  Recommendations for Other Services  OT consult    Functional Status Assessment Patient has had a recent decline in their functional status and demonstrates the ability to make significant improvements in function in a reasonable and predictable amount of time.     Precautions / Restrictions Precautions Precautions: Back;Fall Precaution Booklet Issued: No Precaution Comments: educated on no BLT; instucted in proper ways to move to avoid BLT Required Braces or Orthoses:  (no brace per orders) Restrictions Weight Bearing Restrictions: No      Mobility  Bed Mobility Overal bed mobility: Needs Assistance Bed Mobility: Rolling, Sidelying to Sit Rolling: Min assist Sidelying to sit: Min assist       General bed mobility comments: vc for sequencing/technique; assist to fully roll pelvis onto her side, assist to remove 2nd leg from bed and to raise torso    Transfers Overall transfer level: Needs assistance Equipment used: Rolling walker (2 wheels) Transfers: Sit to/from Stand Sit to Stand: Min guard           General transfer comment: from bed elevated to simulate her bed at home; from [employer] over toilet; vc for safe use of RW    Ambulation/Gait Ambulation/Gait assistance: Min guard Gait Distance (Feet): 12 Feet (toileted, 25) Assistive device: Rolling walker (2 wheels) Gait Pattern/deviations: Step-through pattern, Decreased stride length, Trunk flexed   Gait velocity interpretation: <1.8 ft/sec, indicate of risk for recurrent falls   General Gait Details: vc for proximity to RW and upright  posture  Stairs            Wheelchair Mobility    Modified Rankin (Stroke Patients Only)       Balance Overall balance assessment:  Mild deficits observed, not formally tested                                           Pertinent Vitals/Pain Pain Assessment Pain Assessment: No/denies pain    Home Living Family/patient expects to be discharged to:: Private residence Living Arrangements: Alone Available Help at Discharge: Family;Available 24 hours/day (children arranging 24/7; eventually brother coming to provide 24/7) Type of Home: House Home Access: Stairs to enter   [employer] of Steps: 1   Home Layout: One level Home Equipment: [place] - single Barista (2 wheels);Wheelchair - manual;BSC/3in1;Shower seat;Grab bars - toilet;Grab bars - tub/shower      Prior Function Prior Level of Function : Independent/Modified Independent             Mobility Comments: prior to nephrostomy tube, going to gym       Hand Dominance        Extremity/Trunk Assessment   Upper Extremity Assessment Upper Extremity Assessment: Generalized weakness    Lower Extremity Assessment Lower Extremity Assessment: Generalized weakness (appeared symmetric; reports numbness tingling in bil LEs)    Cervical / Trunk Assessment Cervical / Trunk Assessment: Back Surgery  Communication   Communication: No difficulties  Cognition Arousal/Alertness: Awake/alert Behavior During Therapy: WFL for tasks assessed/performed Overall Cognitive Status: Within Functional Limits for tasks assessed                                          General Comments      Exercises     Assessment/Plan    PT Assessment Patient needs continued PT services  PT Problem List Decreased strength;Decreased balance;Decreased mobility;Decreased knowledge of use of DME;Decreased knowledge of precautions;Impaired sensation       PT Treatment Interventions DME instruction;Gait training;Stair training;Functional mobility training;Therapeutic activities;Therapeutic exercise;Balance  training;Patient/family education    PT Goals (Current goals can be found in the Care Plan section)  Acute Rehab PT Goals Patient Stated Goal: Return home with family's support PT Goal Formulation: With patient Time For Goal Achievement: 03/16/22 Potential to Achieve Goals: Good    Frequency Min 5X/week     Co-evaluation               AM-PAC PT "6 Clicks" Mobility  Outcome Measure Help needed turning from your back to your side while in a flat bed without using bedrails?: A Little Help needed moving from lying on your back to sitting on the side of a flat bed without using bedrails?: A Lot Help needed moving to and from a bed to a chair (including a wheelchair)?: A Little Help needed standing up from a chair using your arms (e.g., wheelchair or bedside chair)?: A Little Help needed to walk in hospital room?: A Little Help needed climbing 3-5 steps with a railing? : A Lot 6 Click Score: 16    End of Session Equipment Utilized During Treatment: Gait belt Activity Tolerance: Patient tolerated treatment well Patient left: in chair;with call bell/phone within reach;with chair alarm set;with family/visitor present Nurse Communication: Mobility status PT Visit Diagnosis: Unsteadiness on feet (  R26.81)    [id number] PT Time Calculation (min) (ACUTE ONLY): 44 min   Charges:   PT Evaluation $PT Eval Low Complexity: 1 Low PT Treatments $Gait Training: 8-22 mins $Self Care/Home Management: [place]  Office [phone]   Rexanne Mano 03/02/2022, 12:17 PM

## 2022-03-02 NOTE — Progress Notes (Signed)
PROGRESS NOTE    Melissa Leon  [id number] DOB: 1945/07/06 DOA: 02/27/2022 PCP: Katherina Mires, MD   Brief Narrative:  Mrs. Melissa Leon, a 77 y/o with complaints of acute on chronic back pain with lower extremity weakness and parasthesias as well as being able to void urine. Her history is complicated by recent right ureteral obstruction with hydronephrosis since December '23. She has had a somewhat complicated course with multiple episodes of UTI/Pyelo requiring treatment (most recent MRSA) initially requiring nephrostomy - once infection cleared right sided stent was placed (02/27/22).   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 - Neurosurgery following - Surgery 03/01/22 - Left L3-4 L4-5 hemilaminectomy and medial facetectomy with evacuation of epidural abscess - Prednisone/toradol ongoing - PRN Morphine and oxycodone ordered postoperatively - pain currently well controlled - Once blood cultures have resulted will likely need PICC placement for prolonged abx therapy - will consult ID on timing of picc line and antibiotics            Ureteral obstruction, right Patient with ureteral stent placement 2/9 (day of admission) No reported complications at procedure - urology notified of admission   Essential hypertension Hold losartan/hctz Continue amlodipine   DM2 (diabetes mellitus, type 2) ([place]), uncontrolled with hyperglycemia 01/04/22 A1C 5.6%. Secondary to steroids given above   Cannot rule out concurrent UTI Normal UA 2/7 - none collected here prior to antibiotics/fluids Remains afebrile Surgery as above, vancomycin added until cultures result.  Ceftriaxone x3  days to cover questionable UTI.  DVT prophylaxis: heparin Code Status: Full Family Communication: None present  Status is: Inpt  Dispo: The patient is from: Home              Anticipated d/c is to: TBD pending post surgical ambulation/symptoms              Anticipated d/c date is: 48-72h pending clinical/surgical course              Patient currently NOT medically stable for discharge  Consultants:  Neurosurgery  Procedures:  As above  Antimicrobials:  Vancomycin ongoing - long term abx expected  Subjective: No acute issues/events overnight - pain well controlled, bilateral lower extremity strength and paresthesias appear to be improving but not yet resolved.  Otherwise denies nausea vomiting diarrhea constipation headache fevers chills chest pain  Objective: Vitals:   03/01/22 1700 03/01/22 2325 03/02/22 0300 03/02/22 0723  BP: (!) 168/84 (!) 176/89 (!) 174/89 (!) 182/76  Pulse: 89 88 78 82  Resp: 17   15  Temp: 98.5 F (36.9 C) 97.6 F (36.4 C)  97.7 F (36.5 C)  TempSrc: Oral Oral  Oral  SpO2: 97% 98%  99%  Weight:      Height:        Intake/Output Summary (Last 24 hours) at 03/02/2022 0757 Last data filed at 03/02/2022 0600 Gross per 24 hour  Intake 1603 ml  Output 4750 ml  Net -3147 ml    Filed Weights   02/27/22 2013 03/01/22 0856  Weight: 79.4 kg 79.4 kg    Examination:  General:  Pleasantly resting in bed, No acute distress. HEENT:  Normocephalic atraumatic.  Sclerae nonicteric, noninjected.  Extraocular movements  intact bilaterally. Neck:  Without mass or deformity.  Trachea is midline. Lungs:  Clear to auscultate bilaterally without rhonchi, wheeze, or rales. Heart:  Regular rate and rhythm.  Without murmurs, rubs, or gallops. Abdomen:  Soft, nontender, nondistended.  Without guarding or rebound. Extremities: Without cyanosis, clubbing, edema, BLE strength 4/5  Data Reviewed: I have personally reviewed following labs and imaging  studies  CBC: Recent Labs  Lab 02/23/22 1424 02/25/22 0940 02/27/22 2206 03/02/22 0712  WBC 18.9* 18.6* 18.6* 13.2*  NEUTROABS  --  15.9* 17.8*  --   HGB 12.9 12.6 10.8* 11.4*  HCT 42.1 39.6 32.5* 34.8*  MCV 98.1 94.5 91.3 90.4  PLT 733* 552* 355 [id number]    Basic Metabolic Panel: Recent Labs  Lab 02/25/22 0940 02/27/22 2148  NA 134* 130*  K 3.9 4.7  CL 98 100  CO2 23 23  GLUCOSE 187* 284*  BUN 26* 24*  CREATININE 0.79 0.82  CALCIUM 9.2 9.1    GFR: Estimated Creatinine Clearance: 63.3 mL/min (by C-G formula based on SCr of 0.82 mg/dL). Liver Function Tests: Recent Labs  Lab 02/25/22 0940 02/27/22 2148  AST 19 38  ALT 30 108*  ALKPHOS 114 201*  BILITOT 0.5 1.0  PROT 7.8 7.1  ALBUMIN 3.7 2.8*    Recent Labs  Lab 02/25/22 0940  LIPASE 45    CBG: Recent Labs  Lab 03/01/22 1453 03/01/22 1735 03/01/22 2004 03/01/22 2324 03/02/22 0322  GLUCAP 179* 309* 249* 228* 228*    Recent Results (from the past 240 hour(s))  Culture, blood (Routine X 2) w Reflex to ID Panel     Status: None (Preliminary result)   Collection Time: 02/28/22  5:15 PM   Specimen: BLOOD  Result Value Ref Range Status   Specimen Description BLOOD SITE NOT SPECIFIED  Final   Special Requests   Final    BOTTLES DRAWN AEROBIC ONLY Blood Culture adequate volume   Culture   Final    NO GROWTH < 24 HOURS Performed at [facility], [place] [address] [place], [place] [address]    Report Status PENDING  Incomplete  Culture, blood (Routine X 2) w Reflex to ID Panel     Status: None (Preliminary result)   Collection Time: 02/28/22  5:32 PM   Specimen: BLOOD  Result Value Ref Range Status   Specimen Description BLOOD SITE NOT SPECIFIED  Final   Special Requests   Final    BOTTLES DRAWN AEROBIC AND ANAEROBIC Blood Culture results may not be optimal due to an excessive volume of blood received in culture bottles   Culture   Final    NO GROWTH < 24 HOURS Performed at [place], [place] [address], [place], [place] [address]    Report Status PENDING  Incomplete  Surgical pcr screen     Status: Abnormal   Collection Time: 03/01/22  9:22 AM   Specimen: Nasal Mucosa; Nasal Swab  Result Value Ref Range Status   MRSA, PCR POSITIVE (A) NEGATIVE Final    Comment: RESULT CALLED TO, READ BACK BY AND VERIFIED WITH:  Arnell Sieving, RN 03/01/22 1127 A. LAFRANCE    Staphylococcus aureus POSITIVE (A) NEGATIVE Final    Comment: (NOTE) The Xpert SA Assay (FDA approved for NASAL specimens in patients 62 years of age and older), is one component of a comprehensive surveillance program. It is not intended to diagnose infection nor to guide or monitor treatment. Performed at [facility], [address] [place], [place] [address]   Aerobic/Anaerobic Culture w Gram Stain (surgical/deep wound)     Status: None (Preliminary result)   Collection Time: 03/01/22 10:25 AM   Specimen: Abscess  Result Value Ref Range Status   Specimen Description ABSCESS  Final   Special Requests LUMBAR EPIDURAL  Final   Gram Stain   Final    MODERATE WBC PRESENT, PREDOMINANTLY PMN FEW GRAM POSITIVE COCCI IN SINGLES IN PAIRS Performed at [facility], [address], [place], [place] [address]    Culture PENDING  Incomplete   Report Status PENDING  Incomplete    Radiology Studies: DG Lumbar Spine 2-3 Views  Result Date: 03/01/2022 CLINICAL DATA:  Spine localization EXAM: LUMBAR SPINE - 2-3 VIEW COMPARISON:  02/28/2022 FINDINGS: Two cross-table lateral radiographs of the lumbar spine were obtained intraoperatively, both of which demonstrate surgical instrumentation posterior to the L3-L4 disc space. Please note there is transitional lumbosacral anatomy with sacralization of the L5 segment, as described on previous MRI. IMPRESSION: Surgical instrumentation posterior to the L3-L4 disc space. Electronically Signed   By: Davina Poke D.O.   On: 03/01/2022 13:35   MR Lumbar Spine W Wo  Contrast  Result Date: 02/28/2022 CLINICAL DATA:  Spinal stenosis, lumbar neurogenic claudication. Leukocytosis. EXAM: MRI LUMBAR SPINE WITHOUT AND WITH CONTRAST TECHNIQUE: Multiplanar and multiecho pulse sequences of the lumbar spine were obtained without and with intravenous contrast. 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 Incompletely developed disc space at the L5-S1 level. Lowest well developed disc space is labeled as L4-L5. The lowest rib-bearing segment is labeled as T12 (corresponding to axial series 6, image 6). Alignment: Grade 1 anterolisthesis of L3 on L4. Minimal anterolisthesis of L4 on L5. Vertebrae: No acute fracture. No evidence of discitis. Bilateral facet joint effusions at L3-L4 concerning for septic arthritis, particularly on the left (series 4, image 11). Mild subchondral marrow edema which may be reactive. Early changes of osteomyelitis are not excluded. Benign intraosseous hemangiomas most notably within the L4 and L1 vertebral bodies. No suspicious marrow replacing bone lesion. Conus medullaris and cauda equina: Conus extends to the T12-L1 level. Conus and cauda equina appear normal. Rim enhancing fluid collection within the posterior epidural space spanning from the level of the mid L3 vertebral body to the L5-S1 disc space (series 8, image 9). Approximate dimensions of 1.1 x 0.9 cm transaxially and extending craniocaudally 7.8 cm. Paraspinal and other soft tissues: Lower posterior paraspinal muscle edema. There is a small abscess within the soft tissues adjacent to the L4 spinous process on the left, which appears to be arising from the inferior margin of the left L3-L4 facet joint measuring approximately 2.4 x 1.9 x 2.1 cm (series 8, image 11). Fluid collection versus complex synovial cyst posterior to the right L3-L4 facet joint measuring  1.8 x 1.1 x 1.1 cm (series 6, image 31). No inflammatory changes or fluid collections within the psoas musculature. Disc levels: T12-L1: Mild facet arthropathy.  No foraminal or canal stenosis. L1-L2: Mild facet arthropathy.  No foraminal or canal stenosis. L2-L3: Mild annular disc bulge with mild-to-moderate bilateral facet arthropathy and ligamentum flavum buckling. Mild-to-moderate canal stenosis. No significant foraminal stenosis. L3-L4: Anterolisthesis with disc uncovering and diffuse disc bulge. Severe bilateral facet arthropathy and ligamentum flavum buckling. Posterior epidural space collection. Findings contribute to severe canal stenosis with severe bilateral subarticular recess  stenosis and moderate bilateral foraminal stenosis. L4-L5: Broad right foraminal to far lateral disc protrusion. Severe bilateral facet arthropathy with ligamentum flavum buckling. Fluid collection in the posterior epidural space. Findings contribute to moderate canal stenosis with right worse than left subarticular recess stenosis. Moderate right and mild left foraminal stenosis. L5-S1: Transitional level.  No impingement. IMPRESSION: 1. 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 2. Bilateral facet joint effusions at L3-L4 concerning for septic arthritis, particularly on the left. Mild subchondral marrow edema which may be reactive. Early changes of osteomyelitis are not excluded. 3. 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 Urgent Neurosurgical evaluation recommended. 4. Small abscess within the posterior paraspinal soft tissues likely arising from the left L3-L4 facet joint measuring approximately 2.4 x 1.9 x 2.1 cm. 5. Fluid collection versus complex synovial cyst posterior to the right L3-L4 facet joint measuring 1.8 x 1.1 x 1.1 cm. 6. Severe canal stenosis with severe bilateral subarticular recess stenosis and moderate bilateral  foraminal stenosis at L3-L4. Canal stenosis is in part secondary to the epidural abscess at this level. 7. Moderate canal stenosis at L4-L5 with moderate right and mild left foraminal stenosis. 8. Mild-to-moderate canal stenosis at L2-L3. 9. No findings to suggest discitis at any level within the lumbar spine. These results were called by telephone at the time of interpretation on 02/28/2022 at 5:08 pm to provider Little Ishikawa, MD, who verbally acknowledged these results. Per Dr. Avon Gully, Neurosurgery is also aware of the above findings. Electronically Signed   By: Davina Poke D.O.   On: 02/28/2022 17:10    Scheduled Meds:  acetaminophen  1,000 mg Oral Q6H   amLODipine  5 mg Oral QHS   celecoxib  200 mg Oral Q12H   Chlorhexidine Gluconate Cloth  6 each Topical Daily   docusate sodium  100 mg Oral Daily   glimepiride  4 mg Oral Daily   hydrochlorothiazide  25 mg Oral q morning   insulin aspart  0-15 Units Subcutaneous TID WC   losartan  100 mg Oral Daily   metFORMIN  1,000 mg Oral BID WC   mupirocin ointment  1 Application Nasal BID   senna  1 tablet Oral BID   sodium chloride flush  3 mL Intravenous Q12H   Continuous Infusions:  sodium chloride     0.9 % NaCl with KCl 20 mEq / L 75 mL/hr at 03/01/22 1433   cefTRIAXone (ROCEPHIN)  IV 1 g (03/01/22 2339)   methocarbamol (ROBAXIN) IV     vancomycin       LOS: 2 days   Time spent: 21mn  Tanielle Emigh C Rodolphe Edmonston, DO [facility]  If 7PM-7AM, please contact night-coverage [email or web address]  03/02/2022, 7:57 AM

## 2022-03-02 NOTE — Inpatient Diabetes Management (Signed)
Inpatient Diabetes Program Recommendations  AACE/ADA: New Consensus Statement on Inpatient Glycemic Control (2015)  Target Ranges:  Prepandial:   less than 140 mg/dL      Peak postprandial:   less than 180 mg/dL (1-2 hours)      Critically ill patients:  140 - 180 mg/dL   Lab Results  Component Value Date   GLUCAP 300 (H) 03/02/2022   HGBA1C 5.6 01/04/2022    Review of Glycemic Control  Latest Reference Range & Units 03/01/22 07:52 03/01/22 11:17 03/01/22 14:53 03/01/22 17:35 03/01/22 20:04 03/01/22 23:24 03/02/22 03:22 03/02/22 08:01  Glucose-Capillary 70 - 99 mg/dL 108 (H) 133 (H) 179 (H) 309 (H) 249 (H) 228 (H) 228 (H) 300 (H)   Diabetes history: DM Type 2 Outpatient Diabetes medications: Amaryl 4 mg Daily, Metformin 1000 mg bid Current orders for Inpatient glycemic control:  Metformin 1000 mg bid Novolog 0-15 units tid Amaryl 4 mg Daily  A1c 5.6 on 12/17  Inpatient Diabetes Program Recommendations:    Pt received decadron 10 mg yesterday. Now on PO prednisone 20 mg Daily  -  Consider Novolog 4 units tid meal coverage if eating >50% of meals  Thanks,  Tama Headings RN, MSN, BC-ADM Inpatient Diabetes Coordinator Team Pager [phone] (8a-5p)

## 2022-03-03 ENCOUNTER — Inpatient Hospital Stay (HOSPITAL_COMMUNITY): Payer: Medicare HMO

## 2022-03-03 ENCOUNTER — Inpatient Hospital Stay: Payer: Self-pay

## 2022-03-03 DIAGNOSIS — G062 Extradural and subdural abscess, unspecified: Secondary | ICD-10-CM

## 2022-03-03 DIAGNOSIS — M48062 Spinal stenosis, lumbar region with neurogenic claudication: Secondary | ICD-10-CM | POA: Diagnosis not present

## 2022-03-03 DIAGNOSIS — I10 Essential (primary) hypertension: Secondary | ICD-10-CM

## 2022-03-03 LAB — CBC
HCT: 35.5 % — ABNORMAL LOW (ref 36.0–46.0)
Hemoglobin: 11.5 g/dL — ABNORMAL LOW (ref 12.0–15.0)
MCH: 29.6 pg (ref 26.0–34.0)
MCHC: 32.4 g/dL (ref 30.0–36.0)
MCV: 91.3 fL (ref 80.0–100.0)
Platelets: 312 10*3/uL (ref 150–400)
RBC: 3.89 MIL/uL (ref 3.87–5.11)
RDW: 14.4 % (ref 11.5–15.5)
WBC: 12 10*3/uL — ABNORMAL HIGH (ref 4.0–10.5)
nRBC: 0 % (ref 0.0–0.2)

## 2022-03-03 LAB — ECHOCARDIOGRAM COMPLETE
Area-P 1/2: 3.31 cm2
Calc EF: 67.1 %
S' Lateral: 2.2 cm
Single Plane A2C EF: 68.2 %
Single Plane A4C EF: 65 %

## 2022-03-03 LAB — GLUCOSE, CAPILLARY
Glucose-Capillary: 116 mg/dL — ABNORMAL HIGH (ref 70–99)
Glucose-Capillary: 126 mg/dL — ABNORMAL HIGH (ref 70–99)
Glucose-Capillary: 107 mg/dL — ABNORMAL HIGH (ref 70–99)
Glucose-Capillary: 139 mg/dL — ABNORMAL HIGH (ref 70–99)
Glucose-Capillary: 103 mg/dL — ABNORMAL HIGH (ref 70–99)

## 2022-03-03 LAB — BASIC METABOLIC PANEL
Anion gap: 8 (ref 5–15)
BUN: 33 mg/dL — ABNORMAL HIGH (ref 8–23)
CO2: 22 mmol/L (ref 22–32)
Calcium: 8.9 mg/dL (ref 8.9–10.3)
Chloride: 104 mmol/L (ref 98–111)
Creatinine, Ser: 1 mg/dL (ref 0.44–1.00)
GFR, Estimated: 58 mL/min — ABNORMAL LOW (ref >60)
Glucose, Bld: 120 mg/dL — ABNORMAL HIGH (ref 70–99)
Potassium: 4 mmol/L (ref 3.5–5.1)
Sodium: 134 mmol/L — ABNORMAL LOW (ref 135–145)

## 2022-03-03 LAB — VANCOMYCIN, PEAK: Vancomycin Pk: 50 ug/mL — ABNORMAL HIGH (ref 30–40)

## 2022-03-03 LAB — CYTOLOGY - NON PAP

## 2022-03-03 MED ORDER — PEG 3350-KCL-NA BICARB-NACL 420 G PO SOLR
4000.0000 mL | Freq: Once | ORAL | Status: AC
  Administered 2022-03-03: 4000 mL via ORAL
  Filled 2022-03-03: qty 4000

## 2022-03-03 MED ORDER — ADULT MULTIVITAMIN W/MINERALS CH
1.0000 | ORAL_TABLET | Freq: Every day | ORAL | Status: DC
  Administered 2022-03-04 – 2022-03-06 (×3): 1 via ORAL
  Filled 2022-03-03 (×3): qty 1

## 2022-03-03 MED ORDER — MAGNESIUM CITRATE PO SOLN
1.0000 | Freq: Once | ORAL | Status: AC
  Administered 2022-03-03: 1 via ORAL
  Filled 2022-03-03: qty 296

## 2022-03-03 MED ORDER — POLYETHYLENE GLYCOL 3350 17 G PO PACK
17.0000 g | PACK | Freq: Two times a day (BID) | ORAL | Status: DC
  Administered 2022-03-03: 17 g via ORAL
  Filled 2022-03-03 (×2): qty 1

## 2022-03-03 MED ORDER — ENSURE ENLIVE PO LIQD
237.0000 mL | Freq: Two times a day (BID) | ORAL | Status: DC
  Administered 2022-03-03 – 2022-03-05 (×4): 237 mL via ORAL

## 2022-03-03 NOTE — Progress Notes (Signed)
PHARMACY CONSULT NOTE FOR:  OUTPATIENT  PARENTERAL ANTIBIOTIC THERAPY (OPAT)  Indication: MRSA epidural abscess Regimen: Daptomycin 650 mg IV every 24 hours End date: 04/12/22 (8 weeks from 03/01/22)  IV antibiotic discharge orders are pended. To discharging provider:  please sign these orders via discharge navigator,  Select New Orders & click on the button choice - Manage This Unsigned Work.     Thank you for allowing pharmacy to be a part of this patient's care.  Alycia Rossetti, PharmD, BCPS Infectious Diseases Clinical Pharmacist 03/04/2022 12:57 PM   **Pharmacist phone directory can now be found on [email or web address] ([email or web address]).  Listed under [place].

## 2022-03-03 NOTE — Progress Notes (Signed)
PT Cancellation Note  Patient Details Name: Ginna V Brotzman MRN: [id number] DOB: September 30, 1945   Cancelled Treatment:    Reason Eval/Treat Not Completed: Fatigue/lethargy limiting ability to participate  Patient reports just got back to bed from sitting up this morning. Reports they need her in bed to do an echocardiogram. PT deferred at this time and will reattempt this pm.   [place]  Office [phone]    Rexanne Mano 03/03/2022, 11:41 AM

## 2022-03-03 NOTE — Progress Notes (Signed)
Patient ID: Melissa Leon, female   DOB: 04-04-1945, 77 y.o.   MRN: [id number] Subjective: Patient reports no real pain, foley out, no leg pain or NTW  Objective: Vital signs in last 24 hours: Temp:  [97.7 F (36.5 C)-98 F (36.7 C)] 97.7 F (36.5 C) (02/13 0334) Pulse Rate:  [70-79] 75 (02/13 0334) Resp:  [14-18] 18 (02/13 0334) BP: (139-169)/(66-79) 149/66 (02/13 0334) SpO2:  [98 %-100 %] 99 % (02/13 0334)  Intake/Output from previous day: 02/12 0701 - 02/13 0700 In: 2139.9 [P.O.:740; I.V.:1000; IV Piggyback:399.9] Out: -  Intake/Output this shift: No intake/output data recorded.  Neurologic: Grossly normal, dressing dry  Lab Results: Lab Results  Component Value Date   WBC 12.0 (H) 03/03/2022   HGB 11.5 (L) 03/03/2022   HCT 35.5 (L) 03/03/2022   MCV 91.3 03/03/2022   PLT 312 03/03/2022   Lab Results  Component Value Date   INR 1.2 01/05/2022   BMET Lab Results  Component Value Date   NA 134 (L) 03/03/2022   K 4.0 03/03/2022   CL 104 03/03/2022   CO2 22 03/03/2022   GLUCOSE 120 (H) 03/03/2022   BUN 33 (H) 03/03/2022   CREATININE 1.00 03/03/2022   CALCIUM 8.9 03/03/2022    Studies/Results: DG Lumbar Spine 2-3 Views  Result Date: 03/01/2022 CLINICAL DATA:  Spine localization EXAM: LUMBAR SPINE - 2-3 VIEW COMPARISON:  02/28/2022 FINDINGS: Two cross-table lateral radiographs of the lumbar spine were obtained intraoperatively, both of which demonstrate surgical instrumentation posterior to the L3-L4 disc space. Please note there is transitional lumbosacral anatomy with sacralization of the L5 segment, as described on previous MRI. IMPRESSION: Surgical instrumentation posterior to the L3-L4 disc space. Electronically Signed   By: Davina Poke D.O.   On: 03/01/2022 13:35    Assessment/Plan: Continue vanco  Will need Picc line  Doing well from surgery  PT/OT  Estimated body mass index is 27.4 kg/m as calculated from the following:   Height as of this  encounter: 5' 7.01" (1.702 m).   Weight as of this encounter: 79.4 kg.    LOS: 3 days    Eustace Moore 03/03/2022, 8:09 AM   '

## 2022-03-03 NOTE — Progress Notes (Signed)
PROGRESS NOTE    Melissa Leon  [id number] DOB: Jan 10, 1946 DOA: 02/27/2022 PCP: Katherina Mires, MD   Brief Narrative:  Mrs. Huss, a 77 y/o with complaints of acute on chronic back pain with lower extremity weakness and parasthesias as well as being able to void urine. Her history is complicated by recent right ureteral obstruction with hydronephrosis since December '23. She has had a somewhat complicated course with multiple episodes of UTI/Pyelo requiring treatment (most recent MRSA) initially requiring nephrostomy - once infection cleared right sided stent was placed (02/27/22).   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 Confusion per daughter overnight/this am. Resolved at the time of interview            Ureteral obstruction, right Patient with ureteral stent placement 2/9 (day of admission) No reported complications at procedure - urology notified of admission   Essential hypertension Hold losartan/hctz Continue amlodipine   DM2 (diabetes mellitus, type 2) ([place]), uncontrolled with hyperglycemia 01/04/22 A1C 5.6%. Secondary to steroids given above   Constipation Likely 2/2 narcotics  and decreased ambulation Increase   Cannot rule out concurrent UTI Normal UA 2/7 - none collected here prior to antibiotics/fluids Remains afebrile Surgery as above, vancomycin added until cultures result.  Ceftriaxone x3 days to cover questionable UTI  DVT prophylaxis: Heparin Code Status: Full Family Communication: None present  Status is: Inpt  Dispo: The patient is from: Home              Anticipated d/c is to: Patient hoping to DC home - Will likely need HHPT/RN given ongoing need for IV antibiotics              Anticipated d/c date is: 24-48h pending clinical/surgical course              Patient currently NOT medically stable for discharge  Consultants:  Neurosurgery  Procedures:  As above  Antimicrobials:  Vancomycin ongoing - will likely need 6-8 wks of abx per ID  Subjective: No acute issues/events overnight - pain well controlled, bilateral lower extremity strength and paresthesias appear to be improving - close to baseline. Otherwise denies nausea vomiting diarrhea constipation headache fevers chills chest pain  Objective: Vitals:   03/02/22 1533 03/02/22 2009 03/02/22 2339 03/03/22 0334  BP: (!) 155/71 (!) 169/79 (!) 149/66 (!) 149/66  Pulse: 79 79 70 75  Resp: 14 18 18 18  $ Temp: 98 F (36.7 C) 98 F (36.7 C) 97.7 F (36.5 C) 97.7 F (36.5 C)  TempSrc: Oral Oral Oral Oral  SpO2: 99% 98% 99% 99%  Weight:      Height:        Intake/Output Summary (Last 24 hours) at 03/03/2022 0727 Last data filed at 03/03/2022 0600 Gross per 24 hour  Intake 2139.91 ml  Output --  Net 2139.91 ml    Filed Weights   02/27/22 2013 03/01/22 0856  Weight: 79.4 kg 79.4 kg    Examination:  General:  Pleasantly resting in bed, No acute distress. HEENT:  Normocephalic atraumatic.  Sclerae nonicteric, noninjected.  Extraocular movements intact bilaterally. Neck:  Without mass or deformity.  Trachea is midline. Lungs:  Clear to auscultate bilaterally without rhonchi,  wheeze, or rales. Heart:  Regular rate and rhythm.  Without murmurs, rubs, or gallops. Abdomen:  Soft, nontender, nondistended.  Without guarding or rebound. Extremities: Without cyanosis, clubbing, edema, BLE strength 4/5  Data Reviewed: I have personally reviewed following labs and imaging studies  CBC: Recent Labs  Lab 02/25/22 0940 02/27/22 2206 03/02/22 0712 03/03/22 0318  WBC 18.6* 18.6* 13.2* 12.0*  NEUTROABS 15.9* 17.8*  --   --   HGB 12.6 10.8* 11.4* 11.5*  HCT 39.6 32.5* 34.8* 35.5*  MCV 94.5 91.3 90.4 91.3  PLT 552* 355 271 [id number]    Basic Metabolic Panel: Recent Labs  Lab 02/25/22 0940 02/27/22 2148 03/02/22 0712 03/02/22 1417 03/03/22 0318  NA 134* 130* 133* 132* 134*  K 3.9 4.7 5.4* 5.0 4.0  CL 98 100 103 102 104  CO2 23 23 18* 16* 22  GLUCOSE 187* 284* 217* 262* 120*  BUN 26* 24* 33* 42* 33*  CREATININE 0.79 0.82 0.98 1.20* 1.00  CALCIUM 9.2 9.1 9.1 9.4 8.9    GFR: Estimated Creatinine Clearance: 51.9 mL/min (by C-G formula based on SCr of 1 mg/dL).  Liver Function Tests: Recent Labs  Lab 02/25/22 0940 02/27/22 2148  AST 19 38  ALT 30 108*  ALKPHOS 114 201*  BILITOT 0.5 1.0  PROT 7.8 7.1  ALBUMIN 3.7 2.8*    Recent Labs  Lab 02/25/22 0940  LIPASE 45    CBG: Recent Labs  Lab 03/01/22 2324 03/02/22 0322 03/02/22 0801 03/02/22 1200 03/02/22 2010  GLUCAP 228* 228* 300* 213* 174*    Recent Results (from the past 240 hour(s))  Culture, blood (Routine X 2) w Reflex to ID Panel     Status: None (Preliminary result)   Collection Time: 02/28/22  5:15 PM   Specimen: BLOOD  Result Value Ref Range Status   Specimen Description BLOOD SITE NOT SPECIFIED  Final   Special Requests   Final    BOTTLES DRAWN AEROBIC ONLY Blood Culture adequate volume   Culture   Final    NO GROWTH 2 DAYS Performed at [facility], [place] [address] [place], [place] [address]    Report Status PENDING  Incomplete  Culture, blood (Routine X 2) w Reflex to  ID Panel     Status: None (Preliminary result)   Collection Time: 02/28/22  5:32 PM   Specimen: BLOOD  Result Value Ref Range Status   Specimen Description BLOOD SITE NOT SPECIFIED  Final   Special Requests   Final    BOTTLES DRAWN AEROBIC AND ANAEROBIC Blood Culture results may not be optimal due to an excessive volume of blood received in culture bottles   Culture   Final    NO GROWTH 2 DAYS Performed at [facility], [place] [address], [place], [place] [address]    Report Status PENDING  Incomplete  Surgical pcr screen     Status: Abnormal   Collection Time: 03/01/22  9:22 AM   Specimen: Nasal Mucosa; Nasal Swab  Result Value Ref Range Status   MRSA, PCR POSITIVE (A) NEGATIVE Final  Comment: RESULT CALLED TO, READ BACK BY AND VERIFIED WITH:  Arnell Sieving, RN 03/01/22 1127 A. LAFRANCE    Staphylococcus aureus POSITIVE (A) NEGATIVE Final    Comment: (NOTE) The Xpert SA Assay (FDA approved for NASAL specimens in patients 40 years of age and older), is one component of a comprehensive surveillance program. It is not intended to diagnose infection nor to guide or monitor treatment. Performed at [facility], [place] [address], [place], [place] [address]   Aerobic/Anaerobic Culture w Gram Stain (surgical/deep wound)     Status: None (Preliminary result)   Collection Time: 03/01/22 10:25 AM   Specimen: Abscess  Result Value Ref Range Status   Specimen Description ABSCESS  Final   Special Requests LUMBAR EPIDURAL  Final   Gram Stain   Final    MODERATE WBC PRESENT, PREDOMINANTLY PMN FEW GRAM POSITIVE COCCI IN SINGLES IN PAIRS    Culture   Final    MODERATE STAPHYLOCOCCUS AUREUS SUSCEPTIBILITIES TO FOLLOW Performed at [facility], [place] [address] [place], [place] [address]    Report Status PENDING  Incomplete    Radiology Studies: DG Lumbar Spine 2-3 Views  Result Date: 03/01/2022 CLINICAL DATA:  Spine localization EXAM: LUMBAR SPINE - 2-3 VIEW COMPARISON:   02/28/2022 FINDINGS: Two cross-table lateral radiographs of the lumbar spine were obtained intraoperatively, both of which demonstrate surgical instrumentation posterior to the L3-L4 disc space. Please note there is transitional lumbosacral anatomy with sacralization of the L5 segment, as described on previous MRI. IMPRESSION: Surgical instrumentation posterior to the L3-L4 disc space. Electronically Signed   By: Davina Poke D.O.   On: 03/01/2022 13:35    Scheduled Meds:  amLODipine  5 mg Oral QHS   celecoxib  200 mg Oral Q12H   docusate sodium  100 mg Oral Daily   glimepiride  4 mg Oral Daily   hydrochlorothiazide  25 mg Oral q morning   insulin aspart  0-15 Units Subcutaneous TID WC   losartan  100 mg Oral Daily   metFORMIN  1,000 mg Oral BID WC   mupirocin ointment  1 Application Nasal BID   polyethylene glycol  17 g Oral Daily   senna  1 tablet Oral BID   sodium chloride flush  3 mL Intravenous Q12H   Continuous Infusions:  sodium chloride     sodium chloride 75 mL/hr at 03/02/22 1500   methocarbamol (ROBAXIN) IV     vancomycin Stopped (03/02/22 1114)     LOS: 3 days   Time spent: 58mn  Naveen Clardy C Tasnia Spegal, DO [facility]  If 7PM-7AM, please contact night-coverage [email or web address]  03/03/2022, 7:27 AM

## 2022-03-03 NOTE — Care Management Important Message (Signed)
Important Message  Patient Details  Name: Tassie V Elahi MRN: [id number] Date of Birth: 1945/03/27   Medicare Important Message Given:  Yes  Patient has a Contact Precaution order in place tried to call patient in the room will mail IM to home address   Orbie Pyo 03/03/2022, 10:47 AM

## 2022-03-03 NOTE — Progress Notes (Signed)
Mobility Specialist: Progress Note   03/03/22 1704  Mobility  Activity Ambulated with assistance in room  Level of Assistance Minimal assist, patient does 75% or more  Assistive Device Front wheel walker  Distance Ambulated (ft) 60 ft  Activity Response Tolerated well  Mobility Referral Yes  $Mobility charge 1 Mobility   Pt received in the bed and agreeable to mobility. Mod I with bed mobility and minA to stand. To BR per request, BM unsuccessful, then ambulation in the room. No c/o throughout. Pt back to bed after session with call bell and phone at her side. Bed alarm is on.   [place] Norinne Jeane Mobility Specialist Please contact via SecureChat or Rehab office at [phone]

## 2022-03-03 NOTE — Progress Notes (Signed)
Echocardiogram 2D Echocardiogram has been performed.  Melissa Leon 03/03/2022, 1:48 PM

## 2022-03-03 NOTE — Progress Notes (Signed)
ID Brief Note   omponent 2 d ago  Specimen Description ABSCESS  Special Requests LUMBAR EPIDURAL  Gram Stain MODERATE WBC PRESENT, PREDOMINANTLY PMN FEW GRAM POSITIVE COCCI IN SINGLES IN PAIRS Performed at [facility], [address] [place], [place] [address]  Culture MODERATE METHICILLIN RESISTANT STAPHYLOCOCCUS AUREUS NO ANAEROBES ISOLATED; CULTURE IN PROGRESS FOR 5 DAYS  Report Status PENDING  Organism ID, Bacteria METHICILLIN RESISTANT STAPHYLOCOCCUS AUREUS  Resulting Agency [facility]     Susceptibility   Methicillin resistant staphylococcus aureus    MIC    CIPROFLOXACIN >=8 RESISTANT Resistant    CLINDAMYCIN <=0.25 SENS... Sensitive    ERYTHROMYCIN >=8 RESISTANT Resistant    GENTAMICIN <=0.5 SENSI... Sensitive    Inducible Clindamycin NEGATIVE Sensitive    OXACILLIN >=4 RESISTANT Resistant    RIFAMPIN <=0.5 SENSI... Sensitive    TETRACYCLINE >=16 RESIST... Resistant    TRIMETH/SULFA >=320 RESIS... Resistant    VANCOMYCIN 1 SENSITIVE Sensitive           Susceptibility Comments  Methicillin resistant staphylococcus aureus  MODERATE METHICILLIN RESISTANT STAPHYLOCOCCUS AUREUS         I spoke with patient's daughter over phone Lenna Sciara) after verification of name and DOB of patient per patient's request and discussed plan of care with need for picc line and long term antibiotics for vertebral infection.   TTE 2/13 unremarkable for vegetations   PICC line ordered as blood cx 2/10 negative in 3 days   Will plan to DC ceftriaxone if only MRSA isolated from OR cultures   Following  Rosiland Oz, MD Infectious Disease Physician [facility] [address] [place], [place] [address] Phone: [phone]  Fax: [phone]

## 2022-03-03 NOTE — Progress Notes (Signed)
Physical Therapy Treatment Patient Details Name: Melissa Leon MRN: [id number] DOB: 03-19-1945 Today's Date: 03/03/2022   History of Present Illness Pt is a 77 y/o presented to [facility] ED 02/28/22 with back pain and inability to walk or void urine. 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  PMH Pt with recent h/x right ureteral obstruction with hydronephrosis since December '23. She has had a stormy course: She required a percutaneous nephrostomy tube right. When her infection cleared she was able to have cystoscopy, ureteroscopy and ureteral stent placement right on 02/27/22.    PT Comments    Patient moving better, with less pain, however continues to need cues to maintain back precautions. Walked 75 ft in room with minor fatigue. Notes she has walked to bathroom with nursing 5x today.    Recommendations for follow up therapy are one component of a multi-disciplinary discharge planning process, led by the attending physician.  Recommendations may be updated based on patient status, additional functional criteria and insurance authorization.  Follow Up Recommendations  Home health PT     Assistance Recommended at Discharge Frequent or constant Supervision/Assistance  Patient can return home with the following A little help with walking and/or transfers;A little help with bathing/dressing/bathroom;Assistance with cooking/housework;Assist for transportation;Help with stairs or ramp for entrance   Equipment Recommendations  None recommended by PT    Recommendations for Other Services       Precautions / Restrictions Precautions Precautions: Back;Fall Precaution Booklet Issued: Yes (comment) Precaution Comments: educated on back precautions; required cues for no bending during functional tasks Required Braces or Orthoses:  (no brace per orders) Restrictions Weight  Bearing Restrictions: No     Mobility  Bed Mobility Overal bed mobility: Needs Assistance Bed Mobility: Rolling, Sidelying to Sit Rolling: Min guard Sidelying to sit: Min assist       General bed mobility comments: vc for technique to maintain back precautions    Transfers Overall transfer level: Needs assistance Equipment used: Rolling walker (2 wheels) Transfers: Sit to/from Stand Sit to Stand: Min guard           General transfer comment: guarding for safety; pt with correct technique    Ambulation/Gait Ambulation/Gait assistance: Min guard Gait Distance (Feet): 15 Feet (toileted, 75, toileted, 15 ft) Assistive device: Rolling walker (2 wheels) Gait Pattern/deviations: Step-through pattern, Decreased stride length, Trunk flexed       General Gait Details: vc for proximity to RW and upright posture   Stairs             Wheelchair Mobility    Modified Rankin (Stroke Patients Only)       Balance Overall balance assessment: Mild deficits observed, not formally tested                                          Cognition Arousal/Alertness: Awake/alert Behavior During Therapy: WFL for tasks assessed/performed Overall Cognitive Status: Within Functional Limits for tasks assessed                                          Exercises      General Comments General comments (skin integrity, edema, etc.): Daughter present  Pertinent Vitals/Pain Pain Assessment Pain Assessment: 0-10 Pain Score: 1  Pain Location: back- incisional Pain Descriptors / Indicators: Operative site guarding Pain Intervention(s): Limited activity within patient's tolerance, Monitored during session    Home Living                          Prior Function            PT Goals (current goals can now be found in the care plan section) Acute Rehab PT Goals Patient Stated Goal: Return home with family's support Time For Goal  Achievement: 03/16/22 Potential to Achieve Goals: Good Progress towards PT goals: Progressing toward goals    Frequency    Min 5X/week      PT Plan Current plan remains appropriate    Co-evaluation              AM-PAC PT "6 Clicks" Mobility   Outcome Measure  Help needed turning from your back to your side while in a flat bed without using bedrails?: A Little Help needed moving from lying on your back to sitting on the side of a flat bed without using bedrails?: A Little Help needed moving to and from a bed to a chair (including a wheelchair)?: A Little Help needed standing up from a chair using your arms (e.g., wheelchair or bedside chair)?: A Little Help needed to walk in hospital room?: A Little Help needed climbing 3-5 steps with a railing? : A Lot 6 Click Score: 17    End of Session   Activity Tolerance: Patient tolerated treatment well Patient left: with call bell/phone within reach;with family/visitor present;in bed;with bed alarm set Nurse Communication: Mobility status PT Visit Diagnosis: Unsteadiness on feet (R26.81)     Time: [id number] PT Time Calculation (min) (ACUTE ONLY): 34 min  Charges:  $Gait Training: 23-37 mins                      Arby Barrette, [place]  Office [phone]    Rexanne Mano 03/03/2022, 3:37 PM

## 2022-03-03 NOTE — Progress Notes (Addendum)
Initial Nutrition Assessment  DOCUMENTATION CODES:   Not applicable  INTERVENTION:   Ensure Enlive po BID, each supplement provides 350 kcal and 20 grams of protein.  MVI po daily   Pt at moderate refeed risk; recommend monitor potassium, magnesium and phosphorus labs daily until stable  NUTRITION DIAGNOSIS:   Increased nutrient needs related to post-op healing as evidenced by increased estimated needs.  GOAL:   Patient will meet greater than or equal to 90% of their needs  MONITOR:   PO intake, Supplement acceptance, Labs, Weight trends, Skin, I & O's  REASON FOR ASSESSMENT:   Malnutrition Screening Tool    ASSESSMENT:   77 y/o female with h/o DM, HTN, HLD, depression, DDD, sciatica, lung nodules, frequent UTIs, recent pyelonephritis with MRSA and R UPJ obstruction with severe hydronephrosis s/p right nephrostomy tube placement on 01/05/2022 and now s/p nephrostomy tube removal, stent and TURBT 2/9 who is admitted with back pain with lower extremity weakness. Pt found to have spinal stenosis of lumbar region with neurogenic claudication and posterior epidural abscess now s/p hemilaminectomy and medial facetectomy with evacuation of epidural abscess 2/11.  RD working remotely.  Spoke with pt via phone. Pt reports decreased appetite and oral intake in December r/t her recent hospitalization and urinary issues but reports that her appetite has started to come back over the past month. Pt reports that she has been drinking chocolate Ensure daily at home. Pt denies any recent weight loss. Per chart, pt is down 5lbs(3%) since December; this is not significant. Pt reports eating 25% of an omelet and some fruit for breakfast this morning. RD discussed with pt the importance of adequate nutrition needed to support post op healing and to preserve lean muscle. Pt would like to have chocolate Ensure in hospital; RD will order. Pt is likely at mild to moderate refeed risk. No BM since  admission; pt is on a heavy bowel regimen.   Medications reviewed and include: colace, insulin, hydrochlorothiazide, metformin, miralax, senokot. NacL @75ml$ /hr, vancomycin   Labs reviewed: Na 134(L), K 4.0 wnl, BUN 33(H) Wbc- 12.0(H) Cbgs- 116, 174, 213, 300, 228 x 24 hrs AIC 5.6- 12/2021  NUTRITION - FOCUSED PHYSICAL EXAM: Unable to perform at this time   Diet Order:   Diet Order             Diet regular Room service appropriate? Yes; Fluid consistency: Thin  Diet effective now                  EDUCATION NEEDS:   No education needs have been identified at this time  Skin:  Skin Assessment: Reviewed RN Assessment (incision back, NPI hip)  Last BM:  pta- constipation reported per pt  Height:   Ht Readings from Last 1 Encounters:  03/01/22 5' 7.01" (1.702 m)    Weight:   Wt Readings from Last 1 Encounters:  03/01/22 79.4 kg    Ideal Body Weight:  61.36 kg  BMI:  Body mass index is 27.4 kg/m.  Estimated Nutritional Needs:   Kcal:  1700-2000kcal/day  Protein:  85-100g/day  Fluid:  1.6-1.9L/day  Koleen Distance MS, RD, LDN Please refer to [facility] for RD and/or RD on-call/weekend/after hours pager

## 2022-03-03 NOTE — Plan of Care (Signed)
  Problem: Education: Goal: Knowledge of General Education information will improve Description: Including pain rating scale, medication(s)/side effects and non-pharmacologic comfort measures Outcome: Progressing   Problem: Health Behavior/Discharge Planning: Goal: Ability to manage health-related needs will improve Outcome: Progressing   Problem: Clinical Measurements: Goal: Ability to maintain clinical measurements within normal limits will improve Outcome: Progressing Goal: Diagnostic test results will improve Outcome: Progressing Goal: Respiratory complications will improve Outcome: Progressing Goal: Cardiovascular complication will be avoided Outcome: Progressing   Problem: Activity: Goal: Risk for activity intolerance will decrease Outcome: Progressing   Problem: Nutrition: Goal: Adequate nutrition will be maintained Outcome: Progressing   Problem: Coping: Goal: Level of anxiety will decrease Outcome: Progressing   Problem: Elimination: Goal: Will not experience complications related to bowel motility Outcome: Progressing Goal: Will not experience complications related to urinary retention Outcome: Progressing   Problem: Pain Managment: Goal: General experience of comfort will improve Outcome: Progressing   Problem: Safety: Goal: Ability to remain free from injury will improve Outcome: Progressing   Problem: Skin Integrity: Goal: Risk for impaired skin integrity will decrease Outcome: Progressing   Problem: Education: Goal: Ability to describe self-care measures that may prevent or decrease complications (Diabetes Survival Skills Education) will improve Outcome: Progressing Goal: Individualized Educational Video(s) Outcome: Progressing   Problem: Coping: Goal: Ability to adjust to condition or change in health will improve Outcome: Progressing   Problem: Fluid Volume: Goal: Ability to maintain a balanced intake and output will improve Outcome:  Progressing   Problem: Health Behavior/Discharge Planning: Goal: Ability to identify and utilize available resources and services will improve Outcome: Progressing Goal: Ability to manage health-related needs will improve Outcome: Progressing   Problem: Metabolic: Goal: Ability to maintain appropriate glucose levels will improve Outcome: Progressing   Problem: Nutritional: Goal: Maintenance of adequate nutrition will improve Outcome: Progressing Goal: Progress toward achieving an optimal weight will improve Outcome: Progressing   Problem: Skin Integrity: Goal: Risk for impaired skin integrity will decrease Outcome: Progressing   Problem: Tissue Perfusion: Goal: Adequacy of tissue perfusion will improve Outcome: Progressing   Problem: Education: Goal: Ability to verbalize activity precautions or restrictions will improve Outcome: Progressing Goal: Knowledge of the prescribed therapeutic regimen will improve Outcome: Progressing Goal: Understanding of discharge needs will improve Outcome: Progressing   Problem: Activity: Goal: Ability to avoid complications of mobility impairment will improve Outcome: Progressing Goal: Ability to tolerate increased activity will improve Outcome: Progressing Goal: Will remain free from falls Outcome: Progressing   Problem: Bowel/Gastric: Goal: Gastrointestinal status for postoperative course will improve Outcome: Progressing   Problem: Clinical Measurements: Goal: Ability to maintain clinical measurements within normal limits will improve Outcome: Progressing Goal: Postoperative complications will be avoided or minimized Outcome: Progressing Goal: Diagnostic test results will improve Outcome: Progressing   Problem: Pain Management: Goal: Pain level will decrease Outcome: Progressing   Problem: Skin Integrity: Goal: Will show signs of wound healing Outcome: Progressing   Problem: Health Behavior/Discharge Planning: Goal:  Identification of resources available to assist in meeting health care needs will improve Outcome: Progressing   Problem: Bladder/Genitourinary: Goal: Urinary functional status for postoperative course will improve Outcome: Progressing

## 2022-03-03 NOTE — Care Management Important Message (Signed)
Important Message  Patient Details  Name: Melissa Leon MRN: [id number] Date of Birth: Mavi 11, 1947   Medicare Important Message Given:  Yes     [name] 03/03/2022, 10:38 AM

## 2022-03-04 DIAGNOSIS — M48062 Spinal stenosis, lumbar region with neurogenic claudication: Secondary | ICD-10-CM | POA: Diagnosis not present

## 2022-03-04 DIAGNOSIS — M4620 Osteomyelitis of vertebra, site unspecified: Secondary | ICD-10-CM

## 2022-03-04 DIAGNOSIS — I10 Essential (primary) hypertension: Secondary | ICD-10-CM

## 2022-03-04 LAB — CBC
HCT: 34.3 % — ABNORMAL LOW (ref 36.0–46.0)
Hemoglobin: 11.1 g/dL — ABNORMAL LOW (ref 12.0–15.0)
MCH: 29.7 pg (ref 26.0–34.0)
MCHC: 32.4 g/dL (ref 30.0–36.0)
MCV: 91.7 fL (ref 80.0–100.0)
Platelets: 290 10*3/uL (ref 150–400)
RBC: 3.74 MIL/uL — ABNORMAL LOW (ref 3.87–5.11)
RDW: 14 % (ref 11.5–15.5)
WBC: 11.5 10*3/uL — ABNORMAL HIGH (ref 4.0–10.5)
nRBC: 0 % (ref 0.0–0.2)

## 2022-03-04 LAB — GLUCOSE, CAPILLARY
Glucose-Capillary: 83 mg/dL (ref 70–99)
Glucose-Capillary: 112 mg/dL — ABNORMAL HIGH (ref 70–99)
Glucose-Capillary: 136 mg/dL — ABNORMAL HIGH (ref 70–99)
Glucose-Capillary: 87 mg/dL (ref 70–99)
Glucose-Capillary: 155 mg/dL — ABNORMAL HIGH (ref 70–99)

## 2022-03-04 LAB — VANCOMYCIN, TROUGH: Vancomycin Tr: 19 ug/mL (ref 15–20)

## 2022-03-04 LAB — PHOSPHORUS: Phosphorus: 3.2 mg/dL (ref 2.5–4.6)

## 2022-03-04 LAB — MAGNESIUM: Magnesium: 1.9 mg/dL (ref 1.7–2.4)

## 2022-03-04 LAB — CK: Total CK: 40 U/L (ref 38–234)

## 2022-03-04 LAB — BASIC METABOLIC PANEL
Anion gap: 12 (ref 5–15)
BUN: 24 mg/dL — ABNORMAL HIGH (ref 8–23)
CO2: 21 mmol/L — ABNORMAL LOW (ref 22–32)
Calcium: 8.6 mg/dL — ABNORMAL LOW (ref 8.9–10.3)
Chloride: 102 mmol/L (ref 98–111)
Creatinine, Ser: 0.79 mg/dL (ref 0.44–1.00)
GFR, Estimated: >60 mL/min (ref >60)
Glucose, Bld: 89 mg/dL (ref 70–99)
Potassium: 3.6 mmol/L (ref 3.5–5.1)
Sodium: 135 mmol/L (ref 135–145)

## 2022-03-04 MED ORDER — SODIUM CHLORIDE 0.9% FLUSH
10.0000 mL | Freq: Two times a day (BID) | INTRAVENOUS | Status: DC
  Administered 2022-03-04 – 2022-03-06 (×4): 10 mL

## 2022-03-04 MED ORDER — SODIUM CHLORIDE 0.9% FLUSH: 10.0000 mL | INTRAVENOUS | Status: DC | PRN

## 2022-03-04 MED ORDER — SODIUM CHLORIDE 0.9 % IV SOLN
8.0000 mg/kg | Freq: Every day | INTRAVENOUS | Status: DC
  Administered 2022-03-04 – 2022-03-06 (×3): 650 mg via INTRAVENOUS
  Filled 2022-03-04 (×3): qty 13

## 2022-03-04 MED ORDER — VANCOMYCIN HCL IN DEXTROSE 1-5 GM/200ML-% IV SOLN: 1000.0000 mg | INTRAVENOUS | Status: DC

## 2022-03-04 MED ORDER — POTASSIUM CHLORIDE CRYS ER 20 MEQ PO TBCR
40.0000 meq | EXTENDED_RELEASE_TABLET | Freq: Once | ORAL | Status: AC
  Administered 2022-03-04: 40 meq via ORAL
  Filled 2022-03-04: qty 2

## 2022-03-04 MED ORDER — CHLORHEXIDINE GLUCONATE CLOTH 2 % EX PADS
6.0000 | MEDICATED_PAD | Freq: Every day | CUTANEOUS | Status: DC
  Administered 2022-03-04 – 2022-03-06 (×3): 6 via TOPICAL

## 2022-03-04 NOTE — Progress Notes (Signed)
Subjective: Patient reports doing well, pain improving  Objective: Vital signs in last 24 hours: Temp:  [97.8 F (36.6 C)-98.3 F (36.8 C)] 98.3 F (36.8 C) (02/14 1255) Pulse Rate:  [77-85] 77 (02/14 1255) Resp:  [16-18] 18 (02/14 1255) BP: (126-147)/(61-69) 126/61 (02/14 1255) SpO2:  [100 %] 100 % (02/14 1255)  Intake/Output from previous day: 02/13 0701 - 02/14 0700 In: 3295 [P.O.:1200; I.V.:1795; IV Piggyback:300] Out: -  Intake/Output this shift: No intake/output data recorded.  Neurologic: Grossly normal  Lab Results: Lab Results  Component Value Date   WBC 11.5 (H) 03/04/2022   HGB 11.1 (L) 03/04/2022   HCT 34.3 (L) 03/04/2022   MCV 91.7 03/04/2022   PLT 290 03/04/2022   Lab Results  Component Value Date   INR 1.2 01/05/2022   BMET Lab Results  Component Value Date   NA 135 03/04/2022   K 3.6 03/04/2022   CL 102 03/04/2022   CO2 21 (L) 03/04/2022   GLUCOSE 89 03/04/2022   BUN 24 (H) 03/04/2022   CREATININE 0.79 03/04/2022   CALCIUM 8.6 (L) 03/04/2022    Studies/Results: ECHOCARDIOGRAM COMPLETE  Result Date: 03/03/2022    ECHOCARDIOGRAM REPORT   Patient Name:   Melissa Leon Date of Exam: 03/03/2022 Medical Rec #:  [id number]        Height:       67.0 in Accession #:    [id number]       Weight:       175.0 lb Date of Birth:  02-28-1945         BSA:          1.911 m Patient Age:    77 years         BP:           149/80 mmHg Patient Gender: F                HR:           79 bpm. Exam Location:  Inpatient Procedure: 2D Echo, Cardiac Doppler and Color Doppler Indications:    epidural abscess  History:        Patient has no prior history of Echocardiogram examinations.                 Risk Factors:Hypertension and Diabetes.  Sonographer:    Phineas Douglas Referring Phys: [id number] [place]  1. Left ventricular ejection fraction, by estimation, is 65 to 70%. The left ventricle has normal function. The left ventricle has no regional wall motion  abnormalities. Left ventricular diastolic parameters are consistent with Grade I diastolic dysfunction (impaired relaxation).  2. Right ventricular systolic function is normal. The right ventricular size is normal. Tricuspid regurgitation signal is inadequate for assessing PA pressure.  3. The mitral valve is grossly normal. Mild mitral valve regurgitation.  4. The aortic valve is tricuspid. Aortic valve regurgitation is not visualized.  5. The inferior vena cava is normal in size with greater than 50% respiratory variability, suggesting right atrial pressure of 3 mmHg. Comparison(s): No prior Echocardiogram. Conclusion(s)/Recommendation(s): No evidence of valvular vegetations on this transthoracic echocardiogram. Consider a transesophageal echocardiogram to exclude infective endocarditis if clinically indicated. FINDINGS  Left Ventricle: Left ventricular ejection fraction, by estimation, is 65 to 70%. The left ventricle has normal function. The left ventricle has no regional wall motion abnormalities. The left ventricular internal cavity size was normal in size. There is  no left ventricular hypertrophy. Left ventricular diastolic parameters are consistent with  Grade I diastolic dysfunction (impaired relaxation). Right Ventricle: The right ventricular size is normal. No increase in right ventricular wall thickness. Right ventricular systolic function is normal. Tricuspid regurgitation signal is inadequate for assessing PA pressure. Left Atrium: Left atrial size was normal in size. Right Atrium: Right atrial size was normal in size. Pericardium: There is no evidence of pericardial effusion. Mitral Valve: The mitral valve is grossly normal. There is mild thickening of the mitral valve leaflet(s). There is mild calcification of the mitral valve leaflet(s). Mild mitral annular calcification. Mild mitral valve regurgitation. Tricuspid Valve: The tricuspid valve is normal in structure. Tricuspid valve regurgitation is  not demonstrated. Aortic Valve: The aortic valve is tricuspid. Aortic valve regurgitation is not visualized. Pulmonic Valve: The pulmonic valve was not well visualized. Pulmonic valve regurgitation is not visualized. Aorta: The aortic root is normal in size and structure. Venous: The inferior vena cava is normal in size with greater than 50% respiratory variability, suggesting right atrial pressure of 3 mmHg. IAS/Shunts: The atrial septum is grossly normal.  LEFT VENTRICLE PLAX 2D LVIDd:         3.60 cm      Diastology LVIDs:         2.20 cm      LV e' medial:    5.77 cm/s LV PW:         1.10 cm      LV E/e' medial:  15.3 LV IVS:        1.00 cm      LV e' lateral:   7.07 cm/s LVOT diam:     1.90 cm      LV E/e' lateral: 12.5 LV SV:         55 LV SV Index:   29 LVOT Area:     2.84 cm  LV Volumes (MOD) LV vol d, MOD A2C: 112.0 ml LV vol d, MOD A4C: 113.0 ml LV vol s, MOD A2C: 35.6 ml LV vol s, MOD A4C: 39.6 ml LV SV MOD A2C:     76.4 ml LV SV MOD A4C:     113.0 ml LV SV MOD BP:      77.3 ml RIGHT VENTRICLE             IVC RV Basal diam:  3.50 cm     IVC diam: 1.40 cm RV S prime:     16.00 cm/s TAPSE (M-mode): 2.0 cm LEFT ATRIUM             Index        RIGHT ATRIUM           Index LA diam:        3.30 cm 1.73 cm/m   RA Area:     11.40 cm LA Vol (A2C):   49.2 ml 25.75 ml/m  RA Volume:   20.80 ml  10.89 ml/m LA Vol (A4C):   48.7 ml 25.49 ml/m LA Biplane Vol: 50.4 ml 26.38 ml/m  AORTIC VALVE LVOT Vmax:   85.80 cm/s LVOT Vmean:  62.200 cm/s LVOT VTI:    0.194 m  AORTA Ao Root diam: 3.30 cm Ao Asc diam:  3.40 cm MITRAL VALVE MV Area (PHT): 3.31 cm     SHUNTS MV Decel Time: 229 msec     Systemic VTI:  0.19 m MV E velocity: 88.10 cm/s   Systemic Diam: 1.90 cm MV A velocity: 114.00 cm/s MV E/A ratio:  0.77 Gwyndolyn Kaufman MD Electronically signed by Gwyndolyn Kaufman MD  Signature Date/Time: 03/03/2022/2:04:12 PM    Final    [place] EKG SITE RITE  Result Date: 03/03/2022 If Site Rite image not attached, placement could  not be confirmed due to current cardiac rhythm.   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 She did get her PICC line today. From our standponit she can be discharged home.   LOS: 4 days    Ocie Cornfield Jennavieve Arrick 03/04/2022, 1:00 PM

## 2022-03-04 NOTE — Progress Notes (Signed)
Occupational Therapy Treatment Patient Details Name: Melissa Leon MRN: [id number] DOB: 03-27-1945 Today's Date: 03/04/2022   History of present illness Pt is a 77 y/o presented to [facility] ED 02/28/22 with back pain and inability to walk or void urine. 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  PMH Pt with recent h/x right ureteral obstruction with hydronephrosis since December '23. She has had a stormy course: She required a percutaneous nephrostomy tube right. When her infection cleared she was able to have cystoscopy, ureteroscopy and ureteral stent placement right on 02/27/22.   OT comments  Patient expressed concern over LUE hand weakness and difficulty writing. Patient provided squeeze ball and red therapy putty and instructed on gross grasp and fine motor exercises to increase hand dexterity. Patient demonstrating gains with bed mobility and transfers with min guard to supervision and good carryover for back precautions. Patient discharge recommendations for HHOT continue to be appropriate for continued OT to address self care and functional transfers due to family able to provide support. Acute OT to continue to follow.    Recommendations for follow up therapy are one component of a multi-disciplinary discharge planning process, led by the attending physician.  Recommendations may be updated based on patient status, additional functional criteria and insurance authorization.    Follow Up Recommendations  Home health OT     Assistance Recommended at Discharge Intermittent Supervision/Assistance  Patient can return home with the following  A little help with walking and/or transfers;A little help with bathing/dressing/bathroom;Assistance with cooking/housework;Assist for transportation;Help with stairs or ramp for entrance   Equipment Recommendations  None recommended  by OT    Recommendations for Other Services      Precautions / Restrictions Precautions Precautions: Back;Fall Precaution Booklet Issued: Yes (comment) Required Braces or Orthoses:  (no brace per doctors orders) Restrictions Weight Bearing Restrictions: No       Mobility Bed Mobility Overal bed mobility: Needs Assistance Bed Mobility: Rolling, Sidelying to Sit Rolling: Supervision Sidelying to sit: Supervision       General bed mobility comments: no physical assistance to get to EOB    Transfers Overall transfer level: Needs assistance Equipment used: Rolling walker (2 wheels) Transfers: Sit to/from Stand, Bed to chair/wheelchair/BSC Sit to Stand: Supervision Stand pivot transfers: Min guard         General transfer comment: transfer to [facility] and back to EOB     Balance Overall balance assessment: Mild deficits observed, not formally tested                                         ADL either performed or assessed with clinical judgement   ADL Overall ADL's : Needs assistance/impaired     Grooming: Wash/dry hands;Min guard;Standing                   Toilet Transfer: Min guard;BSC/3in1;Rolling walker (2 wheels)   Toileting- Clothing Manipulation and Hygiene: Minimal assistance;Sit to/from stand Toileting - Clothing Manipulation Details (indicate cue type and reason): required min assist to complete            Extremity/Trunk Assessment              Vision       Perception     Praxis      Cognition Arousal/Alertness: Awake/alert  Behavior During Therapy: WFL for tasks assessed/performed Overall Cognitive Status: Within Functional Limits for tasks assessed                                 General Comments: aware of precautions        Exercises Exercises: Other exercises Other Exercises Other Exercises: LUE squeeze ball exercises Other Exercises: red therapy putty exercises for LUE fine motor and gross  grasp    Shoulder Instructions       General Comments      Pertinent Vitals/ Pain       Pain Assessment Pain Assessment: 0-10 Pain Score: 2  Faces Pain Scale: Hurts a little bit Pain Location: back- incisional Pain Descriptors / Indicators: Operative site guarding Pain Intervention(s): Monitored during session  Home Living                                          Prior Functioning/Environment              Frequency  Min 2X/week        Progress Toward Goals  OT Goals(current goals can now be found in the care plan section)  Progress towards OT goals: Progressing toward goals  Acute Rehab OT Goals Patient Stated Goal: get better OT Goal Formulation: With patient Time For Goal Achievement: 03/16/22 Potential to Achieve Goals: Good ADL Goals Pt Will Perform Grooming: with modified independence;standing Pt Will Perform Lower Body Dressing: with modified independence;sit to/from stand;with adaptive equipment Pt Will Transfer to Toilet: with modified independence;ambulating;bedside commode Pt Will Perform Toileting - Clothing Manipulation and hygiene: with modified independence;sit to/from stand Pt Will Perform Tub/Shower Transfer: Tub transfer;with modified independence;ambulating;rolling walker;shower seat  Plan Discharge plan remains appropriate    Co-evaluation                 AM-PAC OT "6 Clicks" Daily Activity     Outcome Measure   Help from another person eating meals?: None Help from another person taking care of personal grooming?: A Little Help from another person toileting, which includes using toliet, bedpan, or urinal?: A Little Help from another person bathing (including washing, rinsing, drying)?: A Little Help from another person to put on and taking off regular upper body clothing?: A Little Help from another person to put on and taking off regular lower body clothing?: A Little 6 Click Score: 19    End of Session  Equipment Utilized During Treatment: Rolling walker (2 wheels)  OT Visit Diagnosis: Other abnormalities of gait and mobility (R26.89);Muscle weakness (generalized) (M62.81)   Activity Tolerance Patient tolerated treatment well   Patient Left in bed;with call bell/phone within reach;with family/visitor present (seated on EOB with family)   Nurse Communication Mobility status        Time: [id number] OT Time Calculation (min): 42 min  Charges: OT General Charges $OT Visit: 1 Visit OT Treatments $Self Care/Home Management : 8-22 mins $Therapeutic Exercise: 23-37 mins  Lodema Hong, [place]  Office [phone]   Trixie Dredge 03/04/2022, 2:50 PM

## 2022-03-04 NOTE — Progress Notes (Signed)
[facility] Infectious Diseases Follow Up Note  Patient Identification: Patient Name: Melissa Leon MRN: [id number] [place] Date: 02/27/2022  8:09 PM Age: 77 y.o.Today's Date: 03/04/2022  Reason for Visit: epidural abscess   Principal Problem:   Spinal stenosis of lumbar region with neurogenic claudication Active Problems:   Pyelonephritis   DM2 (diabetes mellitus, type 2) ([place])   Essential hypertension   Ureteral obstruction, right   S/P lumbar laminectomy  Antibiotics:  Bactrim 2/9-2/10 Vancomycin 2/9, 2/11-C Ceftriaxone 2/10-2/13 Gentamicin 2/9 Meropenem 2/9   Prior abtx course  cephalexin 12/16 x 7d, 12/22 x 10d (pan-S E.coli) Ciprofloxacin 1/22 x 10d Bactrim 1/25 x 10d    Lines/Hardware: Right ureteral stent  Interval Events: continues to be afebrile, TTE unremarkable for vegetations. 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 - 2/10 blood cx NGTD - 2/11 Left L3-4 L4-5 hemilaminectomy and medial facetectomy with evacuation of epidural abscess. OR cx with Staph aureus. OR cx MRSA   # DM  Recommendations Will switch Vancomycin to Daptomycin for renal safety. Monitor CPK.  Plan for 6 to 8 weeks course from 2/11, preferably 8 week PICC pending Monitor CBC and BMP, CPK Post op care per Neurosurgery Fu in ID clinic arranged FU OR cx to completion, otherwise ID will SO. Please call with questions Plan discussed with patient and daughter at bedside,  Rest of the management as per the primary team. Thank you for the consult. Please page with pertinent questions or  concerns.  ______________________________________________________________________ Subjective patient seen and examined at the bedside. Daughter at bedside, was able to walk to the bathroom with assistance. No other complaints  Vitals BP 133/67 (BP Location: Right Arm)   Pulse 85   Temp 97.9 F (36.6 C) (Oral)   Resp 16   Ht 5' 7.01" (1.702 m)   Wt 79.4 kg   SpO2 100%   BMI 27.40 kg/m     Physical Exam Constitutional: Elderly white female lying in the bed and appears comfortable    Comments:   Cardiovascular:     Rate and Rhythm: Normal rate and regular rhythm.     Heart sounds:   Pulmonary:     Effort: Pulmonary effort is normal on room air     Comments:   Abdominal:     Palpations: Abdomen is soft.     Tenderness: non distended   Musculoskeletal:        General: No swelling or tenderness in peripheral joints   Skin:    Comments: No rashes, Back not examined today   Neurological:     General: awake, alert and oriented   Psychiatric:        Mood and Affect: Mood normal.   Pertinent Microbiology Results for orders placed or performed during the hospital encounter of 02/27/22  Culture, blood (Routine X 2) w Reflex to ID Panel     Status: None (Preliminary result)   Collection Time: 02/28/22  5:15 PM   Specimen: BLOOD  Result Value Ref Range Status   Specimen Description BLOOD SITE NOT SPECIFIED  Final   Special Requests   Final    BOTTLES DRAWN AEROBIC ONLY Blood Culture adequate volume   Culture  Final    NO GROWTH 3 DAYS Performed at [facility], [place] [address] [place], [place] [address]    Report Status PENDING  Incomplete  Culture, blood (Routine X 2) w Reflex to ID Panel     Status: None (Preliminary result)   Collection Time: 02/28/22  5:32 PM   Specimen: BLOOD  Result Value Ref Range Status   Specimen Description BLOOD SITE NOT SPECIFIED  Final   Special Requests   Final    BOTTLES DRAWN AEROBIC AND ANAEROBIC Blood Culture results may  not be optimal due to an excessive volume of blood received in culture bottles   Culture   Final    NO GROWTH 3 DAYS Performed at [facility], [place] [address] [place], [place] [address]    Report Status PENDING  Incomplete  Surgical pcr screen     Status: Abnormal   Collection Time: 03/01/22  9:22 AM   Specimen: Nasal Mucosa; Nasal Swab  Result Value Ref Range Status   MRSA, PCR POSITIVE (A) NEGATIVE Final    Comment: RESULT CALLED TO, READ BACK BY AND VERIFIED WITH:  Arnell Sieving, RN 03/01/22 1127 A. LAFRANCE    Staphylococcus aureus POSITIVE (A) NEGATIVE Final    Comment: (NOTE) The Xpert SA Assay (FDA approved for NASAL specimens in patients 65 years of age and older), is one component of a comprehensive surveillance program. It is not intended to diagnose infection nor to guide or monitor treatment. Performed at [facility], [place] [address], [place], [place] [address]   Aerobic/Anaerobic Culture w Gram Stain (surgical/deep wound)     Status: None (Preliminary result)   Collection Time: 03/01/22 10:25 AM   Specimen: Abscess  Result Value Ref Range Status   Specimen Description ABSCESS  Final   Special Requests LUMBAR EPIDURAL  Final   Gram Stain   Final    MODERATE WBC PRESENT, PREDOMINANTLY PMN FEW GRAM POSITIVE COCCI IN SINGLES IN PAIRS Performed at [facility], [address], [place],  [address]    Culture   Final    MODERATE METHICILLIN RESISTANT STAPHYLOCOCCUS AUREUS NO ANAEROBES ISOLATED; CULTURE IN PROGRESS FOR 5 DAYS    Report Status PENDING  Incomplete   Organism ID, Bacteria METHICILLIN RESISTANT STAPHYLOCOCCUS AUREUS  Final      Susceptibility   Methicillin resistant staphylococcus aureus - MIC*    CIPROFLOXACIN >=8 RESISTANT Resistant     ERYTHROMYCIN >=8 RESISTANT Resistant     GENTAMICIN <=0.5 SENSITIVE Sensitive     OXACILLIN >=4 RESISTANT Resistant     TETRACYCLINE >=16 RESISTANT Resistant     VANCOMYCIN 1 SENSITIVE Sensitive      TRIMETH/SULFA >=320 RESISTANT Resistant     CLINDAMYCIN <=0.25 SENSITIVE Sensitive     RIFAMPIN <=0.5 SENSITIVE Sensitive     Inducible Clindamycin NEGATIVE Sensitive     * MODERATE METHICILLIN RESISTANT STAPHYLOCOCCUS AUREUS  '  Pertinent Lab.    Latest Ref Rng & Units 03/04/2022    3:27 AM 03/03/2022    3:18 AM 03/02/2022    7:12 AM  CBC  WBC 4.0 - 10.5 K/uL 11.5  12.0  13.2   Hemoglobin 12.0 - 15.0 g/dL 11.1  11.5  11.4   Hematocrit 36.0 - 46.0 % 34.3  35.5  34.8   Platelets 150 - 400 K/uL 290  312  271       Latest Ref Rng & Units 03/04/2022    3:27 AM 03/03/2022  3:18 AM 03/02/2022    2:17 PM  CMP  Glucose 70 - 99 mg/dL 89  120  262   BUN 8 - 23 mg/dL 24  33  42   Creatinine 0.44 - 1.00 mg/dL 0.79  1.00  1.20   Sodium 135 - 145 mmol/L 135  134  132   Potassium 3.5 - 5.1 mmol/L 3.6  4.0  5.0   Chloride 98 - 111 mmol/L 102  104  102   CO2 22 - 32 mmol/L 21  22  16   $ Calcium 8.9 - 10.3 mg/dL 8.6  8.9  9.4      Pertinent Imaging today Plain films and CT images have been personally visualized and interpreted; radiology reports have been reviewed. Decision making incorporated into the Impression / Recommendations.  ECHOCARDIOGRAM COMPLETE  Result Date: 03/03/2022    ECHOCARDIOGRAM REPORT   Patient Name:   LANY DEVAUL Date of Exam: 03/03/2022 Medical Rec #:  [id number]        Height:       67.0 in Accession #:    [id number]       Weight:       175.0 lb Date of Birth:  Jun 02, 1945         BSA:          1.911 m Patient Age:    30 years         BP:           149/80 mmHg Patient Gender: F                HR:           79 bpm. Exam Location:  Inpatient Procedure: 2D Echo, Cardiac Doppler and Color Doppler Indications:    epidural abscess  History:        Patient has no prior history of Echocardiogram examinations.                 Risk Factors:Hypertension and Diabetes.  Sonographer:    Phineas Douglas Referring Phys: [id number] [place]  1. Left ventricular ejection  fraction, by estimation, is 65 to 70%. The left ventricle has normal function. The left ventricle has no regional wall motion abnormalities. Left ventricular diastolic parameters are consistent with Grade I diastolic dysfunction (impaired relaxation).  2. Right ventricular systolic function is normal. The right ventricular size is normal. Tricuspid regurgitation signal is inadequate for assessing PA pressure.  3. The mitral valve is grossly normal. Mild mitral valve regurgitation.  4. The aortic valve is tricuspid. Aortic valve regurgitation is not visualized.  5. The inferior vena cava is normal in size with greater than 50% respiratory variability, suggesting right atrial pressure of 3 mmHg. Comparison(s): No prior Echocardiogram. Conclusion(s)/Recommendation(s): No evidence of valvular vegetations on this transthoracic echocardiogram. Consider a transesophageal echocardiogram to exclude infective endocarditis if clinically indicated. FINDINGS  Left Ventricle: Left ventricular ejection fraction, by estimation, is 65 to 70%. The left ventricle has normal function. The left ventricle has no regional wall motion abnormalities. The left ventricular internal cavity size was normal in size. There is  no left ventricular hypertrophy. Left ventricular diastolic parameters are consistent with Grade I diastolic dysfunction (impaired relaxation). Right Ventricle: The right ventricular size is normal. No increase in right ventricular wall thickness. Right ventricular systolic function is normal. Tricuspid regurgitation signal is inadequate for assessing PA pressure. Left Atrium: Left atrial size was normal in size. Right Atrium: Right atrial size was normal in size.  Pericardium: There is no evidence of pericardial effusion. Mitral Valve: The mitral valve is grossly normal. There is mild thickening of the mitral valve leaflet(s). There is mild calcification of the mitral valve leaflet(s). Mild mitral annular calcification.  Mild mitral valve regurgitation. Tricuspid Valve: The tricuspid valve is normal in structure. Tricuspid valve regurgitation is not demonstrated. Aortic Valve: The aortic valve is tricuspid. Aortic valve regurgitation is not visualized. Pulmonic Valve: The pulmonic valve was not well visualized. Pulmonic valve regurgitation is not visualized. Aorta: The aortic root is normal in size and structure. Venous: The inferior vena cava is normal in size with greater than 50% respiratory variability, suggesting right atrial pressure of 3 mmHg. IAS/Shunts: The atrial septum is grossly normal.  LEFT VENTRICLE PLAX 2D LVIDd:         3.60 cm      Diastology LVIDs:         2.20 cm      LV e' medial:    5.77 cm/s LV PW:         1.10 cm      LV E/e' medial:  15.3 LV IVS:        1.00 cm      LV e' lateral:   7.07 cm/s LVOT diam:     1.90 cm      LV E/e' lateral: 12.5 LV SV:         55 LV SV Index:   29 LVOT Area:     2.84 cm  LV Volumes (MOD) LV vol d, MOD A2C: 112.0 ml LV vol d, MOD A4C: 113.0 ml LV vol s, MOD A2C: 35.6 ml LV vol s, MOD A4C: 39.6 ml LV SV MOD A2C:     76.4 ml LV SV MOD A4C:     113.0 ml LV SV MOD BP:      77.3 ml RIGHT VENTRICLE             IVC RV Basal diam:  3.50 cm     IVC diam: 1.40 cm RV S prime:     16.00 cm/s TAPSE (M-mode): 2.0 cm LEFT ATRIUM             Index        RIGHT ATRIUM           Index LA diam:        3.30 cm 1.73 cm/m   RA Area:     11.40 cm LA Vol (A2C):   49.2 ml 25.75 ml/m  RA Volume:   20.80 ml  10.89 ml/m LA Vol (A4C):   48.7 ml 25.49 ml/m LA Biplane Vol: 50.4 ml 26.38 ml/m  AORTIC VALVE LVOT Vmax:   85.80 cm/s LVOT Vmean:  62.200 cm/s LVOT VTI:    0.194 m  AORTA Ao Root diam: 3.30 cm Ao Asc diam:  3.40 cm MITRAL VALVE MV Area (PHT): 3.31 cm     SHUNTS MV Decel Time: 229 msec     Systemic VTI:  0.19 m MV E velocity: 88.10 cm/s   Systemic Diam: 1.90 cm MV A velocity: 114.00 cm/s MV E/A ratio:  0.77 Gwyndolyn Kaufman MD Electronically signed by Gwyndolyn Kaufman MD Signature Date/Time:  03/03/2022/2:04:12 PM    Final    [place] EKG SITE RITE  Result Date: 03/03/2022 If Site Rite image not attached, placement could not be confirmed due to current cardiac rhythm.   I spent 55 minutes for this patient encounter including review of prior medical records, coordination of  care with primary/other specialist with greater than 50% of time being face to face/counseling and discussing diagnostics/treatment plan with the patient/family.  Electronically signed by:   Rosiland Oz, MD Infectious Disease Physician [facility] Pager: [phone]

## 2022-03-04 NOTE — Plan of Care (Signed)
  Problem: Education: Goal: Knowledge of General Education information will improve Description: Including pain rating scale, medication(s)/side effects and non-pharmacologic comfort measures Outcome: Progressing   Problem: Health Behavior/Discharge Planning: Goal: Ability to manage health-related needs will improve Outcome: Progressing   Problem: Clinical Measurements: Goal: Ability to maintain clinical measurements within normal limits will improve Outcome: Progressing Goal: Will remain free from infection Outcome: Progressing Goal: Diagnostic test results will improve Outcome: Progressing Goal: Respiratory complications will improve Outcome: Progressing Goal: Cardiovascular complication will be avoided Outcome: Progressing   Problem: Activity: Goal: Risk for activity intolerance will decrease Outcome: Progressing   Problem: Nutrition: Goal: Adequate nutrition will be maintained Outcome: Progressing   Problem: Coping: Goal: Level of anxiety will decrease Outcome: Progressing   Problem: Elimination: Goal: Will not experience complications related to bowel motility Outcome: Progressing Goal: Will not experience complications related to urinary retention Outcome: Progressing   Problem: Pain Managment: Goal: General experience of comfort will improve Outcome: Progressing   Problem: Safety: Goal: Ability to remain free from injury will improve Outcome: Progressing   Problem: Skin Integrity: Goal: Risk for impaired skin integrity will decrease Outcome: Progressing   Problem: Education: Goal: Ability to describe self-care measures that may prevent or decrease complications (Diabetes Survival Skills Education) will improve Outcome: Progressing Goal: Individualized Educational Video(s) Outcome: Progressing   Problem: Coping: Goal: Ability to adjust to condition or change in health will improve Outcome: Progressing   Problem: Fluid Volume: Goal: Ability to  maintain a balanced intake and output will improve Outcome: Progressing   Problem: Health Behavior/Discharge Planning: Goal: Ability to identify and utilize available resources and services will improve Outcome: Progressing Goal: Ability to manage health-related needs will improve Outcome: Progressing   Problem: Metabolic: Goal: Ability to maintain appropriate glucose levels will improve Outcome: Progressing   Problem: Nutritional: Goal: Maintenance of adequate nutrition will improve Outcome: Progressing Goal: Progress toward achieving an optimal weight will improve Outcome: Progressing   Problem: Skin Integrity: Goal: Risk for impaired skin integrity will decrease Outcome: Progressing   Problem: Tissue Perfusion: Goal: Adequacy of tissue perfusion will improve Outcome: Progressing   Problem: Education: Goal: Ability to verbalize activity precautions or restrictions will improve Outcome: Progressing Goal: Knowledge of the prescribed therapeutic regimen will improve Outcome: Progressing Goal: Understanding of discharge needs will improve Outcome: Progressing   Problem: Activity: Goal: Ability to avoid complications of mobility impairment will improve Outcome: Progressing Goal: Ability to tolerate increased activity will improve Outcome: Progressing Goal: Will remain free from falls Outcome: Progressing   Problem: Bowel/Gastric: Goal: Gastrointestinal status for postoperative course will improve Outcome: Progressing   Problem: Clinical Measurements: Goal: Ability to maintain clinical measurements within normal limits will improve Outcome: Progressing Goal: Postoperative complications will be avoided or minimized Outcome: Progressing Goal: Diagnostic test results will improve Outcome: Progressing   Problem: Pain Management: Goal: Pain level will decrease Outcome: Progressing   Problem: Skin Integrity: Goal: Will show signs of wound healing Outcome:  Progressing   Problem: Health Behavior/Discharge Planning: Goal: Identification of resources available to assist in meeting health care needs will improve Outcome: Progressing   Problem: Bladder/Genitourinary: Goal: Urinary functional status for postoperative course will improve Outcome: Progressing

## 2022-03-04 NOTE — Progress Notes (Signed)
Mobility Specialist - Progress Note   03/04/22 1549  Mobility  Activity Ambulated with assistance in room  Level of Assistance Contact guard assist, steadying assist  Assistive Device Front wheel walker  Distance Ambulated (ft) 15 ft  Activity Response Tolerated fair  Mobility Referral Yes  $Mobility charge 1 Mobility   Pt was received in bed and had a BM. Pt was ModI for bed moblility and MinA to stand. Daughter was present and helped with clean up. No complaints throughout. Pt returned to bed with all needs met.  Franki Monte  Mobility Specialist Please contact via Solicitor or Rehab office at [phone]

## 2022-03-04 NOTE — Progress Notes (Signed)
PROGRESS NOTE    Melissa Leon  [id number] DOB: 1945/04/05 DOA: 02/27/2022 PCP: Katherina Mires, MD   Brief Narrative:  Melissa Leon, a 77 y/o with complaints of acute on chronic back pain with lower extremity weakness and parasthesias as well as being able to void urine. Her history is complicated by recent right ureteral obstruction with hydronephrosis since December '23. She has had a somewhat complicated course with multiple episodes of UTI/Pyelo requiring treatment (most recent MRSA) initially requiring nephrostomy - once infection cleared right sided stent was placed (02/27/22).   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  Patient was given steroids by neurosurgery but now off of it. Infectious diseases also following.  MRSA noted in the cultures from her spinal abscess.  Patient was on vancomycin.  Looks like she has been switched over to daptomycin.  Will defer to infectious disease. Echocardiogram without any evidence of endocarditis.  Normal systolic function of the left ventricle was noted. PICC line to be placed today. Pain control.  PT and OT evaluation.            Ureteral obstruction, right Patient with ureteral stent placement 2/9 (day of admission) No reported complications at procedure - urology notified of admission   Essential hypertension Blood pressure is reasonably well-controlled.  Noted to be on amlodipine, HCTZ, losartan. Renal function stable.  Potassium noted to be 3.6 and will be repleted.   DM2 (diabetes mellitus, type 2) ([place]), uncontrolled with hyperglycemia 01/04/22 A1C 5.6%.  Noted to be on metformin   Constipation Likely 2/2 narcotics and decreased ambulation.  Continue with laxatives  and stool softeners.  Cannot rule out concurrent UTI Normal UA 2/7 - none collected here prior to antibiotics/fluids Remains afebrile Surgery as above, vancomycin added until cultures result.  Ceftriaxone x3 days to cover questionable UTI  DVT prophylaxis: Heparin Code Status: Full Family Communication: None present Disposition: Home with home health when medically cleared.  Consultants:  Neurosurgery  Procedures:  As above  Antimicrobials:  Vancomycin ongoing - will likely need 6-8 wks of abx per ID   Subjective: Denies any complaints.  Had a bowel movement after being constipated for several days.  No nausea or vomiting.  Melissa Leon is at the bedside.  Back pain is reasonably well-controlled.    Objective: Vitals:   03/03/22 1715 03/03/22 2000 03/04/22 0406 03/04/22 0809  BP: 136/68 (!) 147/69 133/67 133/64  Pulse: 78 79 85 77  Resp: 16 16 16 18  $ Temp: 97.9 F (36.6 C) 97.8 F (36.6 C) 97.9 F (36.6 C) 97.9 F (36.6 C)  TempSrc: Oral Axillary Oral Oral  SpO2: 100% 100% 100% 100%  Weight:      Height:        Intake/Output Summary (Last 24 hours) at 03/04/2022 1030 Last data filed at 03/04/2022 0600 Gross per 24 hour  Intake 3087.54 ml  Output --  Net 3087.54 ml    Filed Weights   02/27/22 2013 03/01/22 0856  Weight: 79.4 kg 79.4 kg    Examination:  General appearance: Awake alert.  In no distress Resp: Clear to auscultation bilaterally.  Normal effort Cardio: S1-S2 is normal regular.  No S3-S4.  No rubs murmurs or bruit GI: Abdomen is soft.  Nontender nondistended.  Bowel sounds are present normal.  No masses organomegaly Extremities: No edema.  Able to move both of her lower extremities  Data Reviewed: I have personally reviewed following labs and imaging studies  CBC: Recent Labs  Lab 02/27/22 2206 03/02/22 0712 03/03/22 0318 03/04/22 0327  WBC 18.6* 13.2* 12.0* 11.5*  NEUTROABS 17.8*  --   --   --   HGB 10.8* 11.4* 11.5* 11.1*  HCT 32.5*  34.8* 35.5* 34.3*  MCV 91.3 90.4 91.3 91.7  PLT 355 271 312 [id number]    Basic Metabolic Panel: Recent Labs  Lab 02/27/22 2148 03/02/22 0712 03/02/22 1417 03/03/22 0318 03/04/22 0327  NA 130* 133* 132* 134* 135  K 4.7 5.4* 5.0 4.0 3.6  CL 100 103 102 104 102  CO2 23 18* 16* 22 21*  GLUCOSE 284* 217* 262* 120* 89  BUN 24* 33* 42* 33* 24*  CREATININE 0.82 0.98 1.20* 1.00 0.79  CALCIUM 9.1 9.1 9.4 8.9 8.6*  MG  --   --   --   --  1.9  PHOS  --   --   --   --  3.2    GFR: Estimated Creatinine Clearance: 64.9 mL/min (by C-G formula based on SCr of 0.79 mg/dL).  Liver Function Tests: Recent Labs  Lab 02/27/22 2148  AST 38  ALT 108*  ALKPHOS 201*  BILITOT 1.0  PROT 7.1  ALBUMIN 2.8*    CBG: Recent Labs  Lab 03/03/22 1713 03/03/22 2036 03/03/22 2326 03/04/22 0404 03/04/22 0807  GLUCAP 107* 139* 103* 83 112*    Recent Results (from the past 240 hour(s))  Culture, blood (Routine X 2) w Reflex to ID Panel     Status: None (Preliminary result)   Collection Time: 02/28/22  5:15 PM   Specimen: BLOOD  Result Value Ref Range Status   Specimen Description BLOOD SITE NOT SPECIFIED  Final   Special Requests   Final    BOTTLES DRAWN AEROBIC ONLY Blood Culture adequate volume   Culture   Final    NO GROWTH 4 DAYS Performed at [facility], [place] [address] [place], [place] [address]    Report Status PENDING  Incomplete  Culture, blood (Routine X 2) w Reflex to ID Panel     Status: None (Preliminary result)   Collection Time: 02/28/22  5:32 PM   Specimen: BLOOD  Result Value Ref Range Status   Specimen Description BLOOD SITE NOT SPECIFIED  Final   Special Requests   Final    BOTTLES DRAWN AEROBIC AND ANAEROBIC Blood Culture results may not be optimal due to an excessive volume of blood received in culture bottles   Culture   Final    NO GROWTH 4 DAYS Performed at [facility], [place] [address] [place], [place] [address]    Report Status PENDING  Incomplete   Surgical pcr screen     Status: Abnormal   Collection Time: 03/01/22  9:22 AM   Specimen: Nasal Mucosa; Nasal Swab  Result Value Ref Range Status   MRSA, PCR POSITIVE (A) NEGATIVE Final    Comment: RESULT CALLED TO, READ BACK BY AND VERIFIED WITH:  Arnell Sieving, RN 03/01/22 1127 A. LAFRANCE    Staphylococcus aureus POSITIVE (A) NEGATIVE Final    Comment: (NOTE) The Xpert SA Assay (FDA approved for NASAL specimens in patients 25 years of age and older), is one component of a comprehensive surveillance program. It is not intended to diagnose infection nor to guide or monitor treatment. Performed at  [facility], [place] [address], [place], [place] [address]   Aerobic/Anaerobic Culture w Gram Stain (surgical/deep wound)     Status: None (Preliminary result)   Collection Time: 03/01/22 10:25 AM   Specimen: Abscess  Result Value Ref Range Status   Specimen Description ABSCESS  Final   Special Requests LUMBAR EPIDURAL  Final   Gram Stain   Final    MODERATE WBC PRESENT, PREDOMINANTLY PMN FEW GRAM POSITIVE COCCI IN SINGLES IN PAIRS Performed at [facility], [address], [place], [place] [address]    Culture   Final    MODERATE METHICILLIN RESISTANT STAPHYLOCOCCUS AUREUS NO ANAEROBES ISOLATED; CULTURE IN PROGRESS FOR 5 DAYS    Report Status PENDING  Incomplete   Organism ID, Bacteria METHICILLIN RESISTANT STAPHYLOCOCCUS AUREUS  Final      Susceptibility   Methicillin resistant staphylococcus aureus - MIC*    CIPROFLOXACIN >=8 RESISTANT Resistant     ERYTHROMYCIN >=8 RESISTANT Resistant     GENTAMICIN <=0.5 SENSITIVE Sensitive     OXACILLIN >=4 RESISTANT Resistant     TETRACYCLINE >=16 RESISTANT Resistant     VANCOMYCIN 1 SENSITIVE Sensitive     TRIMETH/SULFA >=320 RESISTANT Resistant     CLINDAMYCIN <=0.25 SENSITIVE Sensitive     RIFAMPIN <=0.5 SENSITIVE Sensitive     Inducible Clindamycin NEGATIVE Sensitive     * MODERATE METHICILLIN RESISTANT STAPHYLOCOCCUS AUREUS     Radiology Studies: ECHOCARDIOGRAM COMPLETE  Result Date: 03/03/2022    ECHOCARDIOGRAM REPORT   Patient Name:   JAMIRAH GRAVELL Date of Exam: 03/03/2022 Medical Rec #:  [id number]        Height:       67.0 in Accession #:    [id number]       Weight:       175.0 lb Date of Birth:  [birth year]         BSA:          1.911 m Patient Age:    45 years         BP:           149/80 mmHg Patient Gender: F                HR:           79 bpm. Exam Location:  Inpatient Procedure: 2D Echo, Cardiac Doppler and Color Doppler Indications:    epidural abscess  History:        Patient has no prior history of Echocardiogram examinations.                 Risk Factors:Hypertension and Diabetes.  Sonographer:    Phineas Douglas Referring Phys: [id number] [place]  1. Left ventricular ejection fraction, by estimation, is 65 to 70%. The left ventricle has normal function. The left ventricle has no regional wall motion abnormalities. Left ventricular diastolic parameters are consistent with Grade I diastolic dysfunction (impaired relaxation).  2. Right ventricular systolic function is normal. The right ventricular size is normal. Tricuspid regurgitation signal is inadequate for assessing PA pressure.  3. The mitral valve is grossly normal. Mild mitral valve regurgitation.  4. The aortic valve is tricuspid. Aortic valve regurgitation is not visualized.  5. The inferior vena cava is normal in size with greater than 50% respiratory variability, suggesting right atrial pressure of 3 mmHg. Comparison(s): No prior Echocardiogram. Conclusion(s)/Recommendation(s): No evidence of valvular vegetations on this transthoracic echocardiogram. Consider a transesophageal echocardiogram to exclude infective endocarditis if  clinically indicated. FINDINGS  Left Ventricle: Left ventricular ejection fraction, by estimation, is 65 to 70%. The left ventricle has normal function. The left ventricle has no regional wall motion abnormalities. The  left ventricular internal cavity size was normal in size. There is  no left ventricular hypertrophy. Left ventricular diastolic parameters are consistent with Grade I diastolic dysfunction (impaired relaxation). Right Ventricle: The right ventricular size is normal. No increase in right ventricular wall thickness. Right ventricular systolic function is normal. Tricuspid regurgitation signal is inadequate for assessing PA pressure. Left Atrium: Left atrial size was normal in size. Right Atrium: Right atrial size was normal in size. Pericardium: There is no evidence of pericardial effusion. Mitral Valve: The mitral valve is grossly normal. There is mild thickening of the mitral valve leaflet(s). There is mild calcification of the mitral valve leaflet(s). Mild mitral annular calcification. Mild mitral valve regurgitation. Tricuspid Valve: The tricuspid valve is normal in structure. Tricuspid valve regurgitation is not demonstrated. Aortic Valve: The aortic valve is tricuspid. Aortic valve regurgitation is not visualized. Pulmonic Valve: The pulmonic valve was not well visualized. Pulmonic valve regurgitation is not visualized. Aorta: The aortic root is normal in size and structure. Venous: The inferior vena cava is normal in size with greater than 50% respiratory variability, suggesting right atrial pressure of 3 mmHg. IAS/Shunts: The atrial septum is grossly normal.  LEFT VENTRICLE PLAX 2D LVIDd:         3.60 cm      Diastology LVIDs:         2.20 cm      LV e' medial:    5.77 cm/s LV PW:         1.10 cm      LV E/e' medial:  15.3 LV IVS:        1.00 cm      LV e' lateral:   7.07 cm/s LVOT diam:     1.90 cm      LV E/e' lateral: 12.5 LV SV:         55 LV SV Index:   29 LVOT Area:     2.84 cm  LV Volumes (MOD) LV vol d, MOD A2C: 112.0 ml LV vol d, MOD A4C: 113.0 ml LV vol s, MOD A2C: 35.6 ml LV vol s, MOD A4C: 39.6 ml LV SV MOD A2C:     76.4 ml LV SV MOD A4C:     113.0 ml LV SV MOD BP:      77.3 ml RIGHT VENTRICLE              IVC RV Basal diam:  3.50 cm     IVC diam: 1.40 cm RV S prime:     16.00 cm/s TAPSE (M-mode): 2.0 cm LEFT ATRIUM             Index        RIGHT ATRIUM           Index LA diam:        3.30 cm 1.73 cm/m   RA Area:     11.40 cm LA Vol (A2C):   49.2 ml 25.75 ml/m  RA Volume:   20.80 ml  10.89 ml/m LA Vol (A4C):   48.7 ml 25.49 ml/m LA Biplane Vol: 50.4 ml 26.38 ml/m  AORTIC VALVE LVOT Vmax:   85.80 cm/s LVOT Vmean:  62.200 cm/s LVOT VTI:    0.194 m  AORTA Ao Root diam: 3.30 cm Ao Asc diam:  3.40 cm  MITRAL VALVE MV Area (PHT): 3.31 cm     SHUNTS MV Decel Time: 229 msec     Systemic VTI:  0.19 m MV E velocity: 88.10 cm/s   Systemic Diam: 1.90 cm MV A velocity: 114.00 cm/s MV E/A ratio:  0.77 Gwyndolyn Kaufman MD Electronically signed by Gwyndolyn Kaufman MD Signature Date/Time: 03/03/2022/2:04:12 PM    Final    [place] EKG SITE RITE  Result Date: 03/03/2022 If Site Rite image not attached, placement could not be confirmed due to current cardiac rhythm.   Scheduled Meds:  amLODipine  5 mg Oral QHS   celecoxib  200 mg Oral Q12H   Chlorhexidine Gluconate Cloth  6 each Topical Daily   docusate sodium  100 mg Oral Daily   feeding supplement  237 mL Oral BID BM   glimepiride  4 mg Oral Daily   hydrochlorothiazide  25 mg Oral q morning   insulin aspart  0-15 Units Subcutaneous TID WC   losartan  100 mg Oral Daily   metFORMIN  1,000 mg Oral BID WC   multivitamin with minerals  1 tablet Oral Daily   mupirocin ointment  1 Application Nasal BID   polyethylene glycol  17 g Oral BID   senna  1 tablet Oral BID   sodium chloride flush  10-40 mL Intracatheter Q12H   sodium chloride flush  3 mL Intravenous Q12H   Continuous Infusions:  sodium chloride     sodium chloride 75 mL/hr at 03/04/22 1018   DAPTOmycin (CUBICIN) 650 mg in sodium chloride 0.9 % IVPB     methocarbamol (ROBAXIN) IV       LOS: 4 days    [facility]  If 7PM-7AM, please contact  night-coverage [email or web address]  03/04/2022, 10:30 AM

## 2022-03-04 NOTE — Progress Notes (Signed)
Pharmacy Antibiotic Note  Melissa Leon is a 77 y.o. female admitted on 02/27/2022 with MRSA  epidural abscess .  Pharmacy has been consulted for vancomycin dosing.    Levels indicate AUC of 699, which is supratherapeutic (Goal 400-550). Renal function stable. Will delay dose by 8 hours to allow for clearance.   Plan: Decrease Vancomycin 1052m q24 hr (AUC 466) Monitor renal function, vancomycin level F/u duration    Height: 5' 7.01" (170.2 cm) Weight: 79.4 kg (175 lb) IBW/kg (Calculated) : 61.62  Temp (24hrs), Avg:98 F (36.7 C), Min:97.8 F (36.6 C), Max:98.2 F (36.8 C)  Recent Labs  Lab 02/25/22 0940 02/27/22 2148 02/27/22 2206 03/02/22 0712 03/02/22 1417 03/03/22 0318 03/03/22 1216 03/04/22 0327  WBC 18.6*  --  18.6* 13.2*  --  12.0*  --  11.5*  CREATININE 0.79 0.82  --  0.98 1.20* 1.00  --  0.79  VANCOTROUGH  --   --   --   --   --   --   --  19  VANCOPEAK  --   --   --   --   --   --  50*  --      Estimated Creatinine Clearance: 64.9 mL/min (by C-G formula based on SCr of 0.79 mg/dL).    Allergies  Allergen Reactions   Choline Fenofibrate Other (See Comments)    ELEVATED LFT(s)   Penicillins Hives   Ozempic (0.25 Or 0.5 Mg-Dose) [Semaglutide(0.25 Or 0.522mDos)] Nausea Only and Other (See Comments)    SEVERE NAUSEA   Statins     Aches, pain    Antimicrobials this admission: Vancomycin 150051m/10 >> Merrem 1gm x 1 2/10 CTX 2/10 >> [2/13] Bactrim DS 2/10 >> 2/11  Dose adjustments this admission: 2/14 VP 50, VR 19, AUC = 699; decrease vanc 1500m85m1000mg39mh  Microbiology results: 2/10 BCx:  ngtd 2/11 MRSA +  2/11 abscess: MRSA    Thank you for allowing pharmacy to be a part of this patient's care.  LydiaBenetta SparrmD, BCPS, BCCP Clinical Pharmacist  Please check AMION for all [facility] numbers After 10:00 PM, call [phone]

## 2022-03-04 NOTE — Progress Notes (Signed)
Peripherally Inserted Central Catheter Placement  The IV Nurse has discussed with the patient and/or persons authorized to consent for the patient, the purpose of this procedure and the potential benefits and risks involved with this procedure.  The benefits include less needle sticks, lab draws from the catheter, and the patient may be discharged home with the catheter. Risks include, but not limited to, infection, bleeding, blood clot (thrombus formation), and puncture of an artery; nerve damage and irregular heartbeat and possibility to perform a PICC exchange if needed/ordered by physician.  Alternatives to this procedure were also discussed.  Bard Power PICC patient education guide, fact sheet on infection prevention and patient information card has been provided to patient /or left at bedside.    PICC Placement Documentation  PICC Single Lumen 03/04/22 Right Brachial 39 cm 0 cm (Active)  Indication for Insertion or Continuance of Line Prolonged intravenous therapies 03/04/22 0921  Exposed Catheter (cm) 0 cm 03/04/22 [id number]  Site Assessment Clean, Dry, Intact 03/04/22 [id number]  Line Status Flushed;Blood return noted;Saline locked 03/04/22 0921  Dressing Type Transparent 03/04/22 0921  Dressing Status Antimicrobial disc in place 03/04/22 0921  Safety Lock Not Applicable [id number]  Line Care Connections checked and tightened 03/04/22 0921  Line Adjustment (NICU/IV Team Only) No 03/04/22 0921  Dressing Intervention New dressing 03/04/22 0921  Dressing Change Due 03/11/22 03/04/22 0921       Scotty Court 03/04/2022, 9:23 AM

## 2022-03-04 NOTE — TOC Progression Note (Signed)
Transition of Care [facility]) - Progression Note    Patient Details  Name: Melissa Leon MRN: [id number] Date of Birth: [birth year]  Transition of Care [facility]) CM/SW Contact  Graves-Bigelow, Ocie Cornfield, RN Phone Number: 03/04/2022, 11:38 AM  Clinical Narrative: Case Manager spoke with daughter Lenna Sciara in the conference room regarding discharge planning. Daughter Lenna Sciara was under the impression that the patient was being discharged today and family still needed to get the home ready for the patient. Case Manager confirmed with the doctor that the patient is not leaving today and it will be towards the end of the week. Daughter is much appreciative of that decision. Daughter Lenna Sciara lives six houses down from her mother. Daughter Karna Christmas has arrived and she will be here until Sat. Family is trying to develop a plan of care for the patient. Another daughter will arrive on Saturday and they will alternate care until the patients brother arrives on next Wednesday. Case Manager did provide the family with brochures for personal care agencies. Family is aware that they will need to call and arrange those personal care services. Pam with [facility] will educate family members on 03-05-22 @ 8:30 am regarding IV antibiotics. Case Manager will continue to follow for additional transition of care needs as the patient progresses.     Expected Discharge Plan: [place] Barriers to Discharge: Continued Medical Work up  Expected Discharge Plan and Services   Discharge Planning Services: CM Consult Post Acute Care Choice: [place] arrangements for the past 2 months: Single Family Home    HH Arranged: RN, PT, OT [facility] Agency: [place] Date [address]: 03/02/22   Representative spoke with at [place]: [place] ([place]) Interventions SDOH Screenings   Food Insecurity: No Food Insecurity (02/28/2022)  Housing: Low Risk  (02/28/2022)   Transportation Needs: No Transportation Needs (02/28/2022)  Utilities: Not At Risk (02/28/2022)  Tobacco Use: Medium Risk (03/02/2022)    Readmission Risk Interventions    01/06/2022    9:26 AM  Readmission Risk Prevention Plan  Transportation Screening Complete  PCP or Specialist Appt within 5-7 Days Complete  Home Care Screening Complete  Medication Review (RN CM) Complete

## 2022-03-04 NOTE — Progress Notes (Addendum)
Physical Therapy Treatment Patient Details Name: Melissa Leon MRN: [id number] DOB: 1945-07-24 Today's Date: 03/04/2022   History of Present Illness Pt is a 77 y/o presented to [facility] ED 02/28/22 with back pain and inability to walk or void urine. 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  PMH Pt with recent h/x right ureteral obstruction with hydronephrosis since December '23. She has had a stormy course: She required a percutaneous nephrostomy tube right. When her infection cleared she was able to have cystoscopy, ureteroscopy and ureteral stent placement right on 02/27/22.    PT Comments    Pt progressing well towards her physical therapy goals; seemingly improved pain control and activity tolerance today. Pt ambulating 180 ft with a walker at a min guard assist level. Able to transfer multiple times from [facility] without physical assist (supervision for safety). Will continue to progress as tolerated.    Recommendations for follow up therapy are one component of a multi-disciplinary discharge planning process, led by the attending physician.  Recommendations may be updated based on patient status, additional functional criteria and insurance authorization.  Follow Up Recommendations  Home health PT     Assistance Recommended at Discharge Frequent or constant Supervision/Assistance  Patient can return home with the following A little help with walking and/or transfers;A little help with bathing/dressing/bathroom;Assistance with cooking/housework;Assist for transportation;Help with stairs or ramp for entrance   Equipment Recommendations  None recommended by PT    Recommendations for Other Services       Precautions / Restrictions Precautions Precautions: Back;Fall Precaution Booklet Issued: Yes (comment) Required Braces or Orthoses:  (no brace per  orders) Restrictions Weight Bearing Restrictions: No     Mobility  Bed Mobility Overal bed mobility: Needs Assistance Bed Mobility: Rolling, Sidelying to Sit, Sit to Sidelying Rolling: Supervision Sidelying to sit: Supervision     Sit to sidelying: Mod assist General bed mobility comments: No physical assist to progress to edge of bed, modA for BLE's back into bed    Transfers Overall transfer level: Needs assistance Equipment used: Rolling walker (2 wheels) Transfers: Sit to/from Stand, Bed to chair/wheelchair/BSC Sit to Stand: Supervision Stand pivot transfers: Supervision              Ambulation/Gait Ambulation/Gait assistance: Min guard Gait Distance (Feet): 180 Feet Assistive device: Rolling walker (2 wheels) Gait Pattern/deviations: Step-through pattern, Decreased stride length, Trunk flexed       General Gait Details: Min cues for upright posture   Stairs             Wheelchair Mobility    Modified Rankin (Stroke Patients Only)       Balance Overall balance assessment: Mild deficits observed, not formally tested                                          Cognition Arousal/Alertness: Awake/alert Behavior During Therapy: WFL for tasks assessed/performed Overall Cognitive Status: Within Functional Limits for tasks assessed                                          Exercises General Exercises - Lower Extremity Heel Slides: Both, 10 reps, Supine    General Comments  Pertinent Vitals/Pain Pain Assessment Pain Assessment: Faces Faces Pain Scale: Hurts a little bit Pain Location: back- incisional Pain Descriptors / Indicators: Operative site guarding Pain Intervention(s): Monitored during session    Home Living                          Prior Function            PT Goals (current goals can now be found in the care plan section) Acute Rehab PT Goals Patient Stated Goal: Return home  with family's support Time For Goal Achievement: 03/16/22 Potential to Achieve Goals: Good Progress towards PT goals: Progressing toward goals    Frequency    Min 5X/week      PT Plan Current plan remains appropriate    Co-evaluation              AM-PAC PT "6 Clicks" Mobility   Outcome Measure  Help needed turning from your back to your side while in a flat bed without using bedrails?: A Little Help needed moving from lying on your back to sitting on the side of a flat bed without using bedrails?: A Little Help needed moving to and from a bed to a chair (including a wheelchair)?: A Little Help needed standing up from a chair using your arms (e.g., wheelchair or bedside chair)?: A Little Help needed to walk in hospital room?: A Little Help needed climbing 3-5 steps with a railing? : A Lot 6 Click Score: 17    End of Session   Activity Tolerance: Patient tolerated treatment well Patient left: with call bell/phone within reach;with family/visitor present;in bed;with bed alarm set Nurse Communication: Mobility status PT Visit Diagnosis: Unsteadiness on feet (R26.81)     Time: [id number] PT Time Calculation (min) (ACUTE ONLY): 33 min  Charges:  $Gait Training: 8-22 mins $Therapeutic Activity: 8-22 mins                     Wyona Almas, PT, DPT Acute Rehabilitation Services Office [phone]    Deno Etienne 03/04/2022, 1:57 PM

## 2022-03-05 DIAGNOSIS — I10 Essential (primary) hypertension: Secondary | ICD-10-CM | POA: Diagnosis not present

## 2022-03-05 DIAGNOSIS — M48062 Spinal stenosis, lumbar region with neurogenic claudication: Secondary | ICD-10-CM | POA: Diagnosis not present

## 2022-03-05 DIAGNOSIS — M4620 Osteomyelitis of vertebra, site unspecified: Secondary | ICD-10-CM | POA: Diagnosis not present

## 2022-03-05 LAB — CBC
HCT: 29.4 % — ABNORMAL LOW (ref 36.0–46.0)
Hemoglobin: 9.4 g/dL — ABNORMAL LOW (ref 12.0–15.0)
MCH: 29.4 pg (ref 26.0–34.0)
MCHC: 32 g/dL (ref 30.0–36.0)
MCV: 91.9 fL (ref 80.0–100.0)
Platelets: 271 10*3/uL (ref 150–400)
RBC: 3.2 MIL/uL — ABNORMAL LOW (ref 3.87–5.11)
RDW: 14.2 % (ref 11.5–15.5)
WBC: 11.5 10*3/uL — ABNORMAL HIGH (ref 4.0–10.5)
nRBC: 0 % (ref 0.0–0.2)

## 2022-03-05 LAB — CULTURE, BLOOD (ROUTINE X 2)
Culture: NO GROWTH
Culture: NO GROWTH
Special Requests: ADEQUATE

## 2022-03-05 LAB — BASIC METABOLIC PANEL
Anion gap: 9 (ref 5–15)
BUN: 17 mg/dL (ref 8–23)
CO2: 23 mmol/L (ref 22–32)
Calcium: 8.3 mg/dL — ABNORMAL LOW (ref 8.9–10.3)
Chloride: 101 mmol/L (ref 98–111)
Creatinine, Ser: 0.71 mg/dL (ref 0.44–1.00)
GFR, Estimated: >60 mL/min (ref >60)
Glucose, Bld: 104 mg/dL — ABNORMAL HIGH (ref 70–99)
Potassium: 3.5 mmol/L (ref 3.5–5.1)
Sodium: 133 mmol/L — ABNORMAL LOW (ref 135–145)

## 2022-03-05 LAB — GLUCOSE, CAPILLARY
Glucose-Capillary: 105 mg/dL — ABNORMAL HIGH (ref 70–99)
Glucose-Capillary: 114 mg/dL — ABNORMAL HIGH (ref 70–99)
Glucose-Capillary: 174 mg/dL — ABNORMAL HIGH (ref 70–99)
Glucose-Capillary: 92 mg/dL (ref 70–99)
Glucose-Capillary: 95 mg/dL (ref 70–99)

## 2022-03-05 LAB — PHOSPHORUS: Phosphorus: 3.5 mg/dL (ref 2.5–4.6)

## 2022-03-05 LAB — MAGNESIUM: Magnesium: 1.9 mg/dL (ref 1.7–2.4)

## 2022-03-05 MED ORDER — POTASSIUM CHLORIDE CRYS ER 20 MEQ PO TBCR
40.0000 meq | EXTENDED_RELEASE_TABLET | Freq: Once | ORAL | Status: AC
  Administered 2022-03-05: 40 meq via ORAL
  Filled 2022-03-05: qty 2

## 2022-03-05 NOTE — Care Management (Signed)
    Durable Medical Equipment  (From admission, onward)           Start     Ordered   03/05/22 1323  For home use only DME Hospital bed  Once       Question Answer Comment  Length of Need 6 Months   Patient has (list medical condition): epidural abcsess   The above medical condition requires: Patient requires the ability to reposition frequently   Bed type Semi-electric   Trapeze Bar Yes   Support Surface: Gel Overlay      03/05/22 1323   03/01/22 1207  DME Walker rolling  Once       Question Answer Comment  Walker: With [place]   Patient needs a walker to treat with the following condition Gait disturbance      03/01/22 1206

## 2022-03-05 NOTE — Progress Notes (Addendum)
Physical Therapy Treatment Patient Details Name: Melissa Leon MRN: [id number] DOB: [birth year] Today's Date: 03/05/2022   History of Present Illness Pt is a 77 y/o presented to [facility] ED 02/28/22 with back pain and inability to walk or void urine. 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  PMH Pt with recent h/x right ureteral obstruction with hydronephrosis since December '23. She has had a stormy course: She required a percutaneous nephrostomy tube right. When her infection cleared she was able to have cystoscopy, ureteroscopy and ureteral stent placement right on 02/27/22. PMH: DMII, hypertension.    PT Comments    Pt received sitting EOB after recent transfer to/from [facility] and with good participation and tolerance for transfer, bed mobility instruction, back precaution instruction and gait/threshold step training. Pt unable to safely get from supine to sitting EOB via log roll without external physical assist from simulated home bed set-up and needed use of hospital bed features to get from supine<>sitting via log roll. Discussed with pt/family and pt agreeable to PTA recommending her a hospital bed so she can go home and be compliant with her precautions. Also reviewed safe use of AD, supine/seated BLE exercises for strengthening, benefits of mobility. Pt discussing bathroom modification questions and PTA gave her handout on "Aging Gracefully" [facility] program to look into for possible assistance. Pt continues to benefit from PT services to progress toward functional mobility goals. Equipment recommendations updated below per discussion with pt and supervising PT [employer].  Recommendations for follow up therapy are one component of a multi-disciplinary discharge planning process, led by the attending physician.  Recommendations may be updated based on patient status,  additional functional criteria and insurance authorization.  Follow Up Recommendations  Home health PT     Assistance Recommended at Discharge Frequent or constant Supervision/Assistance  Patient can return home with the following A little help with walking and/or transfers;A little help with bathing/dressing/bathroom;Assistance with cooking/housework;Assist for transportation;Help with stairs or ramp for entrance   Equipment Recommendations  Hospital bed    Recommendations for Other Services       Precautions / Restrictions Precautions Precautions: Back;Fall Precaution Booklet Issued: Yes (comment) Precaution Comments: educated on back precautions; required cues for no bending during functional tasks Required Braces or Orthoses:  (no brace per doctors orders) Restrictions Weight Bearing Restrictions: No     Mobility  Bed Mobility Overal bed mobility: Needs Assistance Bed Mobility: Rolling, Sidelying to Sit Rolling: Min assist Sidelying to sit: Min assist     Sit to sidelying: Mod assist General bed mobility comments: With HOB flat and no rails, pt needing min to modA to position, pt lives alone typically and would benefit from hospital bed to allow her to move in compliance with her back precautions. With hospital bed, pt able to sit up to her R side via log roll with min guard to Supervision.    Transfers Overall transfer level: Needs assistance Equipment used: Rolling walker (2 wheels) Transfers: Sit to/from Stand, Bed to chair/wheelchair/BSC Sit to Stand: Supervision Stand pivot transfers: Min guard         General transfer comment: transfer to [facility] and back to EOB    Ambulation/Gait Ambulation/Gait assistance: Supervision Gait Distance (Feet): 45 Feet (48f, seated break, 481f Assistive device: Rolling walker (2 wheels) Gait Pattern/deviations: Step-through pattern, Decreased stride length, Trunk flexed Gait velocity: grossly <0.4 m/s; limited distance in  the room  Gait velocity interpretation: <1.8 ft/sec, indicate of risk for recurrent falls   General Gait Details: good RW use, cues for posture and proximity to AD intermittently   Stairs Stairs: Yes Stairs assistance: Min guard Stair Management: With walker, Step to pattern, Forwards Number of Stairs: 5 General stair comments: pt ascending 2" platform mat in room to simulate home threshold with RW, cues for safety and sequencing/body mechanics, pt w/o complaint of increased pain/fatigue after performing x5 reps.   Wheelchair Mobility    Modified Rankin (Stroke Patients Only)       Balance Overall balance assessment: Mild deficits observed, not formally tested                                          Cognition Arousal/Alertness: Awake/alert Behavior During Therapy: WFL for tasks assessed/performed Overall Cognitive Status: Within Functional Limits for tasks assessed                                 General Comments: aware of precautions, handout given to pt/dtr to reinforce safe technique with bed mobility and other ADLs        Exercises General Exercises - Lower Extremity Long Arc Quad: AROM, AAROM, Both, 10 reps, Seated Other Exercises Other Exercises: pt given handout for supine "General Strengthening Exercises" as she c/o difficulty with bed mobility, specifically knee extension; pt able to perform this exercise sufficiently while seated EOB and PTA reviewed pt frequency to perform strengthening exercises to improve and pt also given gait belt she can use as leg lifter PRN for bed mobility/active assist for LE positioning in supine. Other Exercises: pt given back precs handout and "Falls At Home" handout for activity/household modification ideas as pt lives alone typically. Also recommend pt/family look into hired caregivers PRN to assist with chores/household tasks to allow her time to rest/recuperate    General Comments General comments  (skin integrity, edema, etc.): Increased drainage from pt's back incision when pt received sitting EOB, RN notified and arrived to room to place additional dressing prior to pt standing.      Pertinent Vitals/Pain Pain Assessment Pain Assessment: 0-10 Faces Pain Scale: Hurts a little bit Pain Location: back- incisional Pain Descriptors / Indicators: Operative site guarding, Discomfort Pain Intervention(s): Monitored during session, Repositioned (reviewed use of ice PRN for pain but no heat or creams over or near incision for pain relief until MD clearance given)     PT Goals (current goals can now be found in the care plan section) Acute Rehab PT Goals Patient Stated Goal: Return home with family's support PT Goal Formulation: With patient Time For Goal Achievement: 03/16/22 Progress towards PT goals: Progressing toward goals    Frequency    Min 5X/week      PT Plan Current plan remains appropriate;Equipment recommendations need to be updated       AM-PAC PT "6 Clicks" Mobility   Outcome Measure  Help needed turning from your back to your side while in a flat bed without using bedrails?: A Lot Help needed moving from lying on your back to sitting on the side of a flat bed without using bedrails?: A Lot Help needed moving to and from a bed to a chair (including a wheelchair)?: A Little Help needed standing up from a chair using your arms (e.g., wheelchair or  bedside chair)?: A Little Help needed to walk in hospital room?: A Little Help needed climbing 3-5 steps with a railing? : A Little 6 Click Score: 16    End of Session Equipment Utilized During Treatment: Gait belt Activity Tolerance: Patient tolerated treatment well Patient left: in bed;with call bell/phone within reach;with family/visitor present;Other (comment) (pt sitting EOB with daughter present and RN arriving to give her medications)   PT Visit Diagnosis: Unsteadiness on feet (R26.81)     Time:  [id number] PT Time Calculation (min) (ACUTE ONLY): 45 min  Charges:  $Gait Training: 8-22 mins $Therapeutic Activity: 8-22 mins                     Graves Nipp P., PTA Acute Rehabilitation Services Secure Chat Preferred 9a-5:30pm Office: [place] 03/05/2022, 1:13 PM

## 2022-03-05 NOTE — Progress Notes (Signed)
Patient ID: Melissa Leon, female   DOB: [birth year], 77 y.o.   MRN: [id number] She looks good, no leg pain, some residual NT, no weakness, walking with walker, getting Dapto, has PICC  We will sign off, f/u with me in 2 wks

## 2022-03-05 NOTE — Progress Notes (Signed)
PROGRESS NOTE    Melissa Leon  [id number] DOB: 1945-03-06 DOA: 02/27/2022 PCP: Katherina Mires, MD   Brief Narrative:  Melissa Leon, a 77 y/o with complaints of acute on chronic back pain with lower extremity weakness and parasthesias as well as being able to void urine. Her history is complicated by recent right ureteral obstruction with hydronephrosis since December '23. She has had a somewhat complicated course with multiple episodes of UTI/Pyelo requiring treatment (most recent MRSA) initially requiring nephrostomy - once infection cleared right sided stent was placed (02/27/22).   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  Patient was given steroids by neurosurgery but now off of it. Infectious diseases also following.  MRSA noted in the cultures from her spinal abscess.  Patient was on vancomycin.  He was subsequently switched over to daptomycin.  Plan is for a 6 to 8-week course.  Final duration to be determined by ID in the outpatient setting. PICC line has been placed.  Family to get teaching today.  Anticipate discharge tomorrow with home health.   Echocardiogram without any evidence of endocarditis.  Normal systolic function of the left ventricle was noted. Pain control.  PT and OT evaluation.            Ureteral obstruction, right Patient with ureteral stent placement 2/9 (day of admission) No reported complications at procedure - urology notified of admission   Essential hypertension Blood pressure is reasonably well-controlled.  Noted to be on amlodipine, HCTZ, losartan. Renal function stable.  Replace potassium.  Magnesium is 1.9.   DM2 (diabetes mellitus, type 2) ([place]), uncontrolled with  hyperglycemia 01/04/22 A1C 5.6%.  Noted to be on metformin   Constipation Likely 2/2 narcotics and decreased ambulation.  Continue with laxatives and stool softeners.  Has had bowel movements in the last 24 to 48 hours.  Cannot rule out concurrent UTI Ceftriaxone x3 days to cover questionable UTI  DVT prophylaxis: SQ Heparin Code Status: Full Family Communication: Discussed with the daughters who are at the bedside Disposition: Anticipate discharge home with home health on 2/16  Consultants:  Neurosurgery  Procedures:  As above    Subjective: Patient feels well.  Back pain is better.  Able to ambulate with a walker.  Daughter is at the bedside.    Objective: Vitals:   03/04/22 2000 03/04/22 2300 03/05/22 0425 03/05/22 0744  BP: 139/62 130/65 124/60 (!) 122/56  Pulse: 70 77 70 82  Resp: 18 18 14 16  $ Temp: 98 F (36.7 C) 98.2 F (36.8 C) 98 F (36.7 C) 98.1 F (36.7 C)  TempSrc: Oral Oral Oral Oral  SpO2: 100% 100% 100% 100%  Weight:      Height:       No intake or output data in the 24 hours ending 03/05/22 1010  Filed Weights   02/27/22 2013 03/01/22 0856  Weight: 79.4 kg 79.4 kg    Examination:  General appearance: Awake alert.  In no distress Resp: Clear to auscultation bilaterally.  Normal effort Cardio: S1-S2 is normal regular.  No S3-S4.  No rubs murmurs or bruit GI: Abdomen is soft.  Nontender nondistended.  Bowel sounds are present normal.  No masses organomegaly Extremities: No edema.    Data Reviewed:   CBC: Recent Labs  Lab  02/27/22 2206 03/02/22 0712 03/03/22 0318 03/04/22 0327 03/05/22 0429  WBC 18.6* 13.2* 12.0* 11.5* 11.5*  NEUTROABS 17.8*  --   --   --   --   HGB 10.8* 11.4* 11.5* 11.1* 9.4*  HCT 32.5* 34.8* 35.5* 34.3* 29.4*  MCV 91.3 90.4 91.3 91.7 91.9  PLT 355 271 312 290 [id number]    Basic Metabolic Panel: Recent Labs  Lab 03/02/22 0712 03/02/22 1417 03/03/22 0318 03/04/22 0327 03/05/22 0429  NA 133* 132* 134* 135 133*   K 5.4* 5.0 4.0 3.6 3.5  CL 103 102 104 102 101  CO2 18* 16* 22 21* 23  GLUCOSE 217* 262* 120* 89 104*  BUN 33* 42* 33* 24* 17  CREATININE 0.98 1.20* 1.00 0.79 0.71  CALCIUM 9.1 9.4 8.9 8.6* 8.3*  MG  --   --   --  1.9 1.9  PHOS  --   --   --  3.2 3.5    GFR: Estimated Creatinine Clearance: 64.9 mL/min (by C-G formula based on SCr of 0.71 mg/dL).  Liver Function Tests: Recent Labs  Lab 02/27/22 2148  AST 38  ALT 108*  ALKPHOS 201*  BILITOT 1.0  PROT 7.1  ALBUMIN 2.8*    CBG: Recent Labs  Lab 03/04/22 1254 03/04/22 1651 03/04/22 1955 03/05/22 0030 03/05/22 0745  GLUCAP 136* 87 155* 105* 114*    Recent Results (from the past 240 hour(s))  Culture, blood (Routine X 2) w Reflex to ID Panel     Status: None   Collection Time: 02/28/22  5:15 PM   Specimen: BLOOD  Result Value Ref Range Status   Specimen Description BLOOD SITE NOT SPECIFIED  Final   Special Requests   Final    BOTTLES DRAWN AEROBIC ONLY Blood Culture adequate volume   Culture   Final    NO GROWTH 5 DAYS Performed at [facility], [place] [address], [place], [place] [address]    Report Status 03/05/2022 FINAL  Final  Culture, blood (Routine X 2) w Reflex to ID Panel     Status: None   Collection Time: 02/28/22  5:32 PM   Specimen: BLOOD  Result Value Ref Range Status   Specimen Description BLOOD SITE NOT SPECIFIED  Final   Special Requests   Final    BOTTLES DRAWN AEROBIC AND ANAEROBIC Blood Culture results may not be optimal due to an excessive volume of blood received in culture bottles   Culture   Final    NO GROWTH 5 DAYS Performed at [facility], [place] [address], [place], [place] [address]    Report Status 03/05/2022 FINAL  Final  Surgical pcr screen     Status: Abnormal   Collection Time: 03/01/22  9:22 AM   Specimen: Nasal Mucosa; Nasal Swab  Result Value Ref Range Status   MRSA, PCR POSITIVE (A) NEGATIVE Final    Comment: RESULT CALLED TO, READ BACK BY AND VERIFIED WITH:   Arnell Sieving, RN 03/01/22 1127 A. LAFRANCE    Staphylococcus aureus POSITIVE (A) NEGATIVE Final    Comment: (NOTE) The Xpert SA Assay (FDA approved for NASAL specimens in patients 60 years of age and older), is one component of a comprehensive surveillance program. It is not intended to diagnose infection nor to guide or monitor treatment. Performed at [facility], [place] [address], [place],  [address]   Aerobic/Anaerobic Culture w Gram Stain (surgical/deep wound)     Status: None (Preliminary result)  Collection Time: 03/01/22 10:25 AM   Specimen: Abscess  Result Value Ref Range Status   Specimen Description ABSCESS  Final   Special Requests LUMBAR EPIDURAL  Final   Gram Stain   Final    MODERATE WBC PRESENT, PREDOMINANTLY PMN FEW GRAM POSITIVE COCCI IN SINGLES IN PAIRS Performed at [facility], [address], [place], [place] [address]    Culture   Final    MODERATE METHICILLIN RESISTANT STAPHYLOCOCCUS AUREUS NO ANAEROBES ISOLATED; CULTURE IN PROGRESS FOR 5 DAYS    Report Status PENDING  Incomplete   Organism ID, Bacteria METHICILLIN RESISTANT STAPHYLOCOCCUS AUREUS  Final      Susceptibility   Methicillin resistant staphylococcus aureus - MIC*    CIPROFLOXACIN >=8 RESISTANT Resistant     ERYTHROMYCIN >=8 RESISTANT Resistant     GENTAMICIN <=0.5 SENSITIVE Sensitive     OXACILLIN >=4 RESISTANT Resistant     TETRACYCLINE >=16 RESISTANT Resistant     VANCOMYCIN 1 SENSITIVE Sensitive     TRIMETH/SULFA >=320 RESISTANT Resistant     CLINDAMYCIN <=0.25 SENSITIVE Sensitive     RIFAMPIN <=0.5 SENSITIVE Sensitive     Inducible Clindamycin NEGATIVE Sensitive     * MODERATE METHICILLIN RESISTANT STAPHYLOCOCCUS AUREUS    Radiology Studies: ECHOCARDIOGRAM COMPLETE  Result Date: 03/03/2022    ECHOCARDIOGRAM REPORT   Patient Name:   Melissa Leon Date of Exam: 03/03/2022 Medical Rec #:  [id number]        Height:       67.0 in Accession #:    [id number]       Weight:        175.0 lb Date of Birth:  1946/01/07         BSA:          1.911 m Patient Age:    31 years         BP:           149/80 mmHg Patient Gender: F                HR:           79 bpm. Exam Location:  Inpatient Procedure: 2D Echo, Cardiac Doppler and Color Doppler Indications:    epidural abscess  History:        Patient has no prior history of Echocardiogram examinations.                 Risk Factors:Hypertension and Diabetes.  Sonographer:    Phineas Douglas Referring Phys: [id number] [place]  1. Left ventricular ejection fraction, by estimation, is 65 to 70%. The left ventricle has normal function. The left ventricle has no regional wall motion abnormalities. Left ventricular diastolic parameters are consistent with Grade I diastolic dysfunction (impaired relaxation).  2. Right ventricular systolic function is normal. The right ventricular size is normal. Tricuspid regurgitation signal is inadequate for assessing PA pressure.  3. The mitral valve is grossly normal. Mild mitral valve regurgitation.  4. The aortic valve is tricuspid. Aortic valve regurgitation is not visualized.  5. The inferior vena cava is normal in size with greater than 50% respiratory variability, suggesting right atrial pressure of 3 mmHg. Comparison(s): No prior Echocardiogram. Conclusion(s)/Recommendation(s): No evidence of valvular vegetations on this transthoracic echocardiogram. Consider a transesophageal echocardiogram to exclude infective endocarditis if clinically indicated. FINDINGS  Left Ventricle: Left ventricular ejection fraction, by estimation, is 65 to 70%. The left ventricle has normal function. The left ventricle has no regional wall motion  abnormalities. The left ventricular internal cavity size was normal in size. There is  no left ventricular hypertrophy. Left ventricular diastolic parameters are consistent with Grade I diastolic dysfunction (impaired relaxation). Right Ventricle: The right ventricular size  is normal. No increase in right ventricular wall thickness. Right ventricular systolic function is normal. Tricuspid regurgitation signal is inadequate for assessing PA pressure. Left Atrium: Left atrial size was normal in size. Right Atrium: Right atrial size was normal in size. Pericardium: There is no evidence of pericardial effusion. Mitral Valve: The mitral valve is grossly normal. There is mild thickening of the mitral valve leaflet(s). There is mild calcification of the mitral valve leaflet(s). Mild mitral annular calcification. Mild mitral valve regurgitation. Tricuspid Valve: The tricuspid valve is normal in structure. Tricuspid valve regurgitation is not demonstrated. Aortic Valve: The aortic valve is tricuspid. Aortic valve regurgitation is not visualized. Pulmonic Valve: The pulmonic valve was not well visualized. Pulmonic valve regurgitation is not visualized. Aorta: The aortic root is normal in size and structure. Venous: The inferior vena cava is normal in size with greater than 50% respiratory variability, suggesting right atrial pressure of 3 mmHg. IAS/Shunts: The atrial septum is grossly normal.  LEFT VENTRICLE PLAX 2D LVIDd:         3.60 cm      Diastology LVIDs:         2.20 cm      LV e' medial:    5.77 cm/s LV PW:         1.10 cm      LV E/e' medial:  15.3 LV IVS:        1.00 cm      LV e' lateral:   7.07 cm/s LVOT diam:     1.90 cm      LV E/e' lateral: 12.5 LV SV:         55 LV SV Index:   29 LVOT Area:     2.84 cm  LV Volumes (MOD) LV vol d, MOD A2C: 112.0 ml LV vol d, MOD A4C: 113.0 ml LV vol s, MOD A2C: 35.6 ml LV vol s, MOD A4C: 39.6 ml LV SV MOD A2C:     76.4 ml LV SV MOD A4C:     113.0 ml LV SV MOD BP:      77.3 ml RIGHT VENTRICLE             IVC RV Basal diam:  3.50 cm     IVC diam: 1.40 cm RV S prime:     16.00 cm/s TAPSE (M-mode): 2.0 cm LEFT ATRIUM             Index        RIGHT ATRIUM           Index LA diam:        3.30 cm 1.73 cm/m   RA Area:     11.40 cm LA Vol (A2C):   49.2  ml 25.75 ml/m  RA Volume:   20.80 ml  10.89 ml/m LA Vol (A4C):   48.7 ml 25.49 ml/m LA Biplane Vol: 50.4 ml 26.38 ml/m  AORTIC VALVE LVOT Vmax:   85.80 cm/s LVOT Vmean:  62.200 cm/s LVOT VTI:    0.194 m  AORTA Ao Root diam: 3.30 cm Ao Asc diam:  3.40 cm MITRAL VALVE MV Area (PHT): 3.31 cm     SHUNTS MV Decel Time: 229 msec     Systemic VTI:  0.19 m MV E velocity: 88.10  cm/s   Systemic Diam: 1.90 cm MV A velocity: 114.00 cm/s MV E/A ratio:  0.77 Gwyndolyn Kaufman MD Electronically signed by Gwyndolyn Kaufman MD Signature Date/Time: 03/03/2022/2:04:12 PM    Final    [place] EKG SITE RITE  Result Date: 03/03/2022 If Site Rite image not attached, placement could not be confirmed due to current cardiac rhythm.   Scheduled Meds:  amLODipine  5 mg Oral QHS   celecoxib  200 mg Oral Q12H   Chlorhexidine Gluconate Cloth  6 each Topical Daily   docusate sodium  100 mg Oral Daily   feeding supplement  237 mL Oral BID BM   glimepiride  4 mg Oral Daily   hydrochlorothiazide  25 mg Oral q morning   insulin aspart  0-15 Units Subcutaneous TID WC   losartan  100 mg Oral Daily   metFORMIN  1,000 mg Oral BID WC   multivitamin with minerals  1 tablet Oral Daily   mupirocin ointment  1 Application Nasal BID   polyethylene glycol  17 g Oral BID   senna  1 tablet Oral BID   sodium chloride flush  10-40 mL Intracatheter Q12H   sodium chloride flush  3 mL Intravenous Q12H   Continuous Infusions:  sodium chloride     sodium chloride 75 mL/hr at 03/05/22 0845   DAPTOmycin (CUBICIN) 650 mg in sodium chloride 0.9 % IVPB 650 mg (03/04/22 1302)   methocarbamol (ROBAXIN) IV       LOS: 5 days    [facility]  If 7PM-7AM, please contact night-coverage [email or web address]  03/05/2022, 10:10 AM

## 2022-03-05 NOTE — TOC Progression Note (Addendum)
Transition of Care [facility]) - Progression Note    Patient Details  Name: Melissa Leon MRN: [id number] Date of Birth: November 02, 1945  Transition of Care [facility]) CM/SW [place], RN Phone Number: 03/05/2022, 12:20 PM  Clinical Narrative:     Received a call from Carolynn Sayers, [employer] regarding family and patient having questions about DME. Reviewed chart, No recommendation for DME by PT  , went in to the room and spoke to patient and daughter, Melissa Leon.  Patient and daughter want a hospital bed, Walker, [facility], and asked if  someone could  come in every day and help her up.  Discussed what Medicare pays for and what they do not. They go upon PT recommendations, there is no coverage for daily care, this will have to be out of pocket if they as well they want someone to come in daily and do the antibiotic. Discussed that this will be private pay. They already have a list, but don't believe they have made calls to set up daily services yet, because they were waiting to see what PT recommends as far as equipment. Daughter states she is advocating for patient, and PT needs to document that she will be safe going home by herself. The daughter is from [place] and will not be staying in [place] post hospitalization. . The patient did not say much, other than she is having trouble getting out of bed and would like a hospital bed for a "few months". PT came into room and discussed with her the family request. She will assess today and write recommendations. 1325 PT re-assessment  appreciated, noted recommendations. Touched base with family, daughter Melissa Leon.  Patient gave consent for Melissa Leon to discuss and decide on needs. Hospital bed ordered per recommendations, with trapeze. Melissa Leon states they already bought a walker. She requests that [employer] call her about equipment and delivery etc. Gave [employer] her number to call for bed. Plan to DC home tomorrow. Daptomycin scheduled for 8P will need a dose prior to  discharge.  Please send medications for DC to [place] on 2/16. 1630 [employer] is trying to see if the bed can be delivered tonight, however they are unable but will be delivering at 2 pm tomorrow, they coordinated with Daughter. Expected Discharge Plan: [place] according to Melissa Leon,  family is planning on hiring for daily antibiotic infusion.This needs to be set prior to DC Expected Discharge Plan and Services   Discharge Planning Services: CM Consult Post Acute Care Choice: [place] arrangements for the past 2 months: Single Family Home                           HH Arranged: RN, PT, OT [facility] Agency: [place] Date Divide: 03/02/22   Representative spoke with at [place]: [place] ([place]) Interventions SDOH Screenings   Food Insecurity: No Food Insecurity (02/28/2022)  Housing: Low Risk  (02/28/2022)  Transportation Needs: No Transportation Needs (02/28/2022)  Utilities: Not At Risk (02/28/2022)  Tobacco Use: Medium Risk (03/02/2022)    Readmission Risk Interventions    01/06/2022    9:26 AM  Readmission Risk Prevention Plan  Transportation Screening Complete  PCP or Specialist Appt within 5-7 Days Complete  Home Care Screening Complete  Medication Review (RN CM) Complete

## 2022-03-05 NOTE — Progress Notes (Signed)
Mobility Specialist: Progress Note   03/05/22 1432  Mobility  Activity Ambulated with assistance in hallway  Level of Assistance Minimal assist, patient does 75% or more  Assistive Device Front wheel walker  Distance Ambulated (ft) 300 ft  Activity Response Tolerated well  Mobility Referral Yes  $Mobility charge 1 Mobility   Pt received in the bed and agreeable to mobility. MinA with bed mobility and contact guard during ambulation. No c/o throughout. Pt back to bed after session with call bell and phone in reach.   Newell Glenard Keesling Mobility Specialist Please contact via SecureChat or Rehab office at [phone]

## 2022-03-06 ENCOUNTER — Other Ambulatory Visit (HOSPITAL_COMMUNITY): Payer: Self-pay

## 2022-03-06 DIAGNOSIS — M48062 Spinal stenosis, lumbar region with neurogenic claudication: Secondary | ICD-10-CM | POA: Diagnosis not present

## 2022-03-06 LAB — GLUCOSE, CAPILLARY
Glucose-Capillary: 167 mg/dL — ABNORMAL HIGH (ref 70–99)
Glucose-Capillary: 103 mg/dL — ABNORMAL HIGH (ref 70–99)

## 2022-03-06 LAB — CBC
HCT: 27.6 % — ABNORMAL LOW (ref 36.0–46.0)
Hemoglobin: 9.4 g/dL — ABNORMAL LOW (ref 12.0–15.0)
MCH: 30.7 pg (ref 26.0–34.0)
MCHC: 34.1 g/dL (ref 30.0–36.0)
MCV: 90.2 fL (ref 80.0–100.0)
Platelets: 245 10*3/uL (ref 150–400)
RBC: 3.06 MIL/uL — ABNORMAL LOW (ref 3.87–5.11)
RDW: 14.3 % (ref 11.5–15.5)
WBC: 9.8 10*3/uL (ref 4.0–10.5)
nRBC: 0 % (ref 0.0–0.2)

## 2022-03-06 LAB — BASIC METABOLIC PANEL
Anion gap: 10 (ref 5–15)
BUN: 15 mg/dL (ref 8–23)
CO2: 25 mmol/L (ref 22–32)
Calcium: 8.6 mg/dL — ABNORMAL LOW (ref 8.9–10.3)
Chloride: 103 mmol/L (ref 98–111)
Creatinine, Ser: 0.82 mg/dL (ref 0.44–1.00)
GFR, Estimated: >60 mL/min (ref >60)
Glucose, Bld: 128 mg/dL — ABNORMAL HIGH (ref 70–99)
Potassium: 3.7 mmol/L (ref 3.5–5.1)
Sodium: 138 mmol/L (ref 135–145)

## 2022-03-06 LAB — PHOSPHORUS: Phosphorus: 3.8 mg/dL (ref 2.5–4.6)

## 2022-03-06 LAB — MAGNESIUM: Magnesium: 1.7 mg/dL (ref 1.7–2.4)

## 2022-03-06 MED ORDER — SENNA 8.6 MG PO TABS
1.0000 | ORAL_TABLET | Freq: Two times a day (BID) | ORAL | 0 refills | Status: DC
  Filled 2022-03-06: qty 120, 60d supply, fill #0

## 2022-03-06 MED ORDER — OXYCODONE-ACETAMINOPHEN 5-325 MG PO TABS
1.0000 | ORAL_TABLET | ORAL | 0 refills | Status: DC | PRN
  Filled 2022-03-06: qty 30, 5d supply, fill #0

## 2022-03-06 MED ORDER — POLYETHYLENE GLYCOL 3350 17 G PO PACK: 17.0000 g | PACK | Freq: Two times a day (BID) | ORAL | 0 refills | Status: DC

## 2022-03-06 MED ORDER — DAPTOMYCIN IV (FOR PTA / DISCHARGE USE ONLY): 650.0000 mg | INTRAVENOUS | 0 refills | Status: DC

## 2022-03-06 MED ORDER — SENNA 8.6 MG PO TABS: 1.0000 | ORAL_TABLET | Freq: Two times a day (BID) | ORAL | 0 refills | Status: DC

## 2022-03-06 MED ORDER — OXYCODONE-ACETAMINOPHEN 5-325 MG PO TABS: 1.0000 | ORAL_TABLET | ORAL | 0 refills | Status: DC | PRN

## 2022-03-06 MED ORDER — CELECOXIB 200 MG PO CAPS
200.0000 mg | ORAL_CAPSULE | Freq: Two times a day (BID) | ORAL | 0 refills | Status: AC
  Filled 2022-03-06: qty 14, 7d supply, fill #0

## 2022-03-06 MED ORDER — POLYETHYLENE GLYCOL 3350 17 GM/SCOOP PO POWD
17.0000 g | Freq: Two times a day (BID) | ORAL | 0 refills | Status: DC
  Filled 2022-03-06: qty 238, 7d supply, fill #0

## 2022-03-06 MED ORDER — CELECOXIB 200 MG PO CAPS: 200.0000 mg | ORAL_CAPSULE | Freq: Two times a day (BID) | ORAL | 0 refills | Status: DC

## 2022-03-06 NOTE — Progress Notes (Signed)
Occupational Therapy Treatment Patient Details Name: Melissa Leon MRN: [id number] DOB: [birth year] Today's Date: 03/06/2022   History of present illness Pt is a 77 y/o presented to [facility] ED 02/28/22 with back pain and inability to walk or void urine. 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  PMH Pt with recent h/x right ureteral obstruction with hydronephrosis since December '23. She has had a stormy course: She required a percutaneous nephrostomy tube right. When her infection cleared she was able to have cystoscopy, ureteroscopy and ureteral stent placement right on 02/27/22. PMH: DMII, hypertension.   OT comments  Patient continues to make good progress with OT treatment. Education provided on AE use for LB dressing but patient was able to doff and donn socks with figure four position and no need for AE. Reacher recommended for home to assist with picking items from floor and assist with dressing when needed. Patient and family provided education on tub bench use with demonstration on transfer and patient able to demonstrate return demonstration with min guard assist for safety. Patient's daughter states she will purchase for home. Further education provided on 3n1 use and how it can be used over commode at home for raised toilet seat. Patient is expected to discharge home with [facility] for further education and training on functional transfers and self care.    Recommendations for follow up therapy are one component of a multi-disciplinary discharge planning process, led by the attending physician.  Recommendations may be updated based on patient status, additional functional criteria and insurance authorization.    Follow Up Recommendations  Home health OT     Assistance Recommended at Discharge Intermittent Supervision/Assistance  Patient can return home with the  following  A little help with walking and/or transfers;A little help with bathing/dressing/bathroom;Assistance with cooking/housework;Assist for transportation;Help with stairs or ramp for entrance   Equipment Recommendations  Tub/shower bench    Recommendations for Other Services      Precautions / Restrictions Precautions Precautions: Back;Fall Precaution Booklet Issued: Yes (comment) Precaution Comments: able to recall precautions Required Braces or Orthoses:  (no brace per doctors orders) Restrictions Weight Bearing Restrictions: No       Mobility Bed Mobility Overal bed mobility: Needs Assistance             General bed mobility comments: standing at sink upon entry and left seated on EOB per patient's request    Transfers Overall transfer level: Needs assistance Equipment used: Rolling walker (2 wheels) Transfers: Sit to/from Stand, Bed to chair/wheelchair/BSC Sit to Stand: Supervision           General transfer comment: supervision for RW. Education on tub bench transfer with min guard assist     Balance Overall balance assessment: Mild deficits observed, not formally tested                                         ADL either performed or assessed with clinical judgement   ADL Overall ADL's : Needs assistance/impaired     Grooming: Wash/dry hands;Supervision/safety;Standing               Lower Body Dressing: Supervision/safety;Sitting/lateral leans Lower Body Dressing Details (indicate cue type and reason): able to doff and donn socks in sitting with figure four position and increased time  Tub/ Shower Transfer: Min guard;Tub transfer;Tub bench Tub/Shower Transfer Details (indicate cue type and reason): demonstration provided and patient was able to return demonstration with min guard assist   General ADL Comments: Patient's daughter present during visit and wll purchase tub bench    Extremity/Trunk Assessment               Vision       Perception     Praxis      Cognition Arousal/Alertness: Awake/alert Behavior During Therapy: WFL for tasks assessed/performed Overall Cognitive Status: Within Functional Limits for tasks assessed                                 General Comments: able to recall precautions        Exercises      Shoulder Instructions       General Comments Patiet's daughter present during visit. Education provided on tub bench, BSC over toilet, and AE.    Pertinent Vitals/ Pain       Pain Assessment Pain Assessment: Faces Faces Pain Scale: Hurts a little bit Pain Location: back- incisional Pain Descriptors / Indicators: Operative site guarding, Discomfort Pain Intervention(s): Limited activity within patient's tolerance, Monitored during session, Repositioned  Home Living                                          Prior Functioning/Environment              Frequency  Min 2X/week        Progress Toward Goals  OT Goals(current goals can now be found in the care plan section)  Progress towards OT goals: Progressing toward goals  Acute Rehab OT Goals Patient Stated Goal: go home OT Goal Formulation: With patient Time For Goal Achievement: 03/16/22 Potential to Achieve Goals: Good ADL Goals Pt Will Perform Grooming: with modified independence;standing Pt Will Perform Lower Body Dressing: with modified independence;sit to/from stand;with adaptive equipment Pt Will Transfer to Toilet: with modified independence;ambulating;bedside commode Pt Will Perform Toileting - Clothing Manipulation and hygiene: with modified independence;sit to/from stand Pt Will Perform Tub/Shower Transfer: Tub transfer;with modified independence;ambulating;rolling walker;shower seat  Plan Discharge plan remains appropriate    Co-evaluation                 AM-PAC OT "6 Clicks" Daily Activity     Outcome Measure   Help from another  person eating meals?: None Help from another person taking care of personal grooming?: A Little Help from another person toileting, which includes using toliet, bedpan, or urinal?: A Little Help from another person bathing (including washing, rinsing, drying)?: A Little Help from another person to put on and taking off regular upper body clothing?: A Little Help from another person to put on and taking off regular lower body clothing?: A Little 6 Click Score: 19    End of Session Equipment Utilized During Treatment: Rolling walker (2 wheels)  OT Visit Diagnosis: Other abnormalities of gait and mobility (R26.89);Muscle weakness (generalized) (M62.81)   Activity Tolerance Patient tolerated treatment well   Patient Left in bed;with call bell/phone within reach;with family/visitor present (seated on EOB)   Nurse Communication Mobility status        Time: [id number] OT Time Calculation (min): 24 min  Charges: OT General Charges $OT Visit: 1 Visit OT Treatments $  Self Care/Home Management : 23-37 mins  Lodema Hong, [place]  Office [phone]   Trixie Dredge 03/06/2022, 12:44 PM

## 2022-03-06 NOTE — Progress Notes (Signed)
ID Brief Note   Diagnosis: Epidural abscess   Culture Result: MRSA  Allergies  Allergen Reactions   Choline Fenofibrate Other (See Comments)    ELEVATED LFT(s)   Penicillins Hives   Ozempic (0.25 Or 0.5 Mg-Dose) [Semaglutide(0.25 Or 0.61m-Dos)] Nausea Only and Other (See Comments)    SEVERE NAUSEA   Statins     Aches, pain    OPAT Orders Discharge antibiotics to be given via PICC line Discharge antibiotics: daptomycin  Per pharmacy protocol  Duration: 8 weeks  End Date: [facility]12/22  [facility]:  Home health RN for IV administration and teaching; PICC line care and labs.    Labs weekly while on IV antibiotics: X__ CBC with differential X__ BMP __ CMP X__ CRP and ESR every 2 weeks  __ Vancomycin trough X__ CK  X__ Please pull PIC at completion of IV antibiotics __ Please leave PIC in place until doctor has seen patient or been notified  Fax weekly labs to ([phone] Clinic Follow Up Appt:  In 3-4 weeks   Result Notes    Component 5 d ago  Specimen Description ABSCESS  Special Requests LUMBAR EPIDURAL  Gram Stain MODERATE WBC PRESENT, PREDOMINANTLY PMN FEW GRAM POSITIVE COCCI IN SINGLES IN PAIRS  Culture MODERATE METHICILLIN RESISTANT STAPHYLOCOCCUS AUREUS NO ANAEROBES ISOLATED [place] [facility] [address], [place] [address] Report Status PENDING  Organism ID, Bacteria METHICILLIN RESISTANT STAPHYLOCOCCUS AUREUS  Resulting Agency [facility]     Susceptibility   Methicillin resistant staphylococcus aureus    MIC    CIPROFLOXACIN >=8 RESISTANT Resistant    CLINDAMYCIN <=0.25 SENS... Sensitive    ERYTHROMYCIN >=8 RESISTANT Resistant    GENTAMICIN <=0.5 SENSI... Sensitive    Inducible Clindamycin NEGATIVE Sensitive    OXACILLIN >=4 RESISTANT Resistant    RIFAMPIN <=0.5 SENSI... Sensitive    TETRACYCLINE >=16 RESIST... Resistant    TRIMETH/SULFA >=320 RESIS...  Resistant    VANCOMYCIN 1 SENSITIVE Sensitive           Susceptibility Comments  Methicillin resistant staphylococcus aureus  MODERATE METHICILLIN RESISTANT STAPHYLOCOCCUS AUREUS         SRosiland Oz MD Infectious Disease Physician [facility] [address] [place] [id number]: [phone] Fax: [phone]

## 2022-03-06 NOTE — Progress Notes (Signed)
Mobility Specialist: Progress Note   03/06/22 1424  Mobility  Activity Ambulated with assistance in hallway  Level of Assistance Contact guard assist, steadying assist  Assistive Device Front wheel walker  Distance Ambulated (ft) 300 ft  Activity Response Tolerated well  Mobility Referral Yes  $Mobility charge 1 Mobility   Received pt sitting EOB having no complaints and agreeable to mobility. Pt was asymptomatic throughout ambulation and returned to room w/o fault. Left sitting EOB w/ call bell in reach and all needs met.  [place] Sheika Coutts Mobility Specialist Please contact via SecureChat or Rehab office at [phone]

## 2022-03-06 NOTE — Discharge Summary (Signed)
[facility]  Physician Discharge Summary   Patient ID: Melissa Leon MRN: [id number] DOB/AGE: 77-Sep-1947 77 y.o.  Admit date: 02/27/2022 Discharge date: 03/06/2022    PCP: Melissa Mires, MD  DISCHARGE DIAGNOSES:  Epidural Abscess   Spinal stenosis of lumbar region with neurogenic claudication   Ureteral obstruction, right   Pyelonephritis   DM2 (diabetes mellitus, type 2) ([place])   Essential hypertension   RECOMMENDATIONS FOR OUTPATIENT FOLLOW UP: Home health ordered for home IV antibiotic therapy    Home Health: RN PT Equipment/Devices: PICC line placement  CODE STATUS: Full code  DISCHARGE CONDITION: fair  Diet recommendation: Regular  INITIAL HISTORY: Melissa Leon, a 77 y/o with complaints of acute on chronic back pain with lower extremity weakness and parasthesias as well as being able to void urine. Her history is complicated by recent right ureteral obstruction with hydronephrosis since December '23. She has had a somewhat complicated course with multiple episodes of UTI/Pyelo requiring treatment (most recent MRSA) initially requiring nephrostomy - once infection cleared right sided stent was placed (02/27/22).    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  Underwent Left L3-4 L4-5 hemilaminectomy and medial facetectomy with evacuation of epidural abscess. Stable from neurological standpoint. Patient was also seen by infectious disease.  MRSA noted in the cultures from her spinal abscess.  Patient was on vancomycin.  She was subsequently switched over to daptomycin. Plan is for a 6 to 8-week course.  Final duration to be determined by ID in the outpatient setting. PICC line has been placed.   Echocardiogram without any evidence of endocarditis.  Normal systolic function of the left ventricle was noted. Pain is reasonably well-controlled.   Home health has been arranged. Neurosurgery and ID to arrange outpatient follow-up.            Ureteral obstruction, right Patient with ureteral stent placement 2/9 (day of admission).  Outpatient follow-up by urology.   Essential hypertension   DM2 (diabetes mellitus, type 2) ([place]), uncontrolled with hyperglycemia 01/04/22 A1C 5.6%.    Constipation Likely 2/2 narcotics and decreased ambulation.  Continue with laxatives and stool softeners. Has had bowel movements in the last 24 to 48 hours.   Patient is stable.  Okay for discharge home today.  Discussed with patient's daughters.  PERTINENT LABS:  The results of significant diagnostics from this hospitalization (including imaging, microbiology, ancillary and laboratory) are listed below for reference.    Microbiology: Recent Results (from the past 240 hour(s))  Culture, blood (Routine X 2) w Reflex to ID Panel     Status: None   Collection Time: 02/28/22  5:15 PM   Specimen: BLOOD  Result Value Ref Range Status   Specimen Description BLOOD SITE NOT SPECIFIED  Final   Special Requests   Final    BOTTLES DRAWN AEROBIC ONLY Blood Culture adequate volume   Culture   Final    NO GROWTH 5 DAYS Performed at [facility], [address] [place], [place] [address]    Report Status 03/05/2022 FINAL  Final  Culture, blood (Routine X 2) w Reflex to ID Panel     Status: None   Collection Time: 02/28/22  5:32 PM   Specimen: BLOOD  Result Value Ref Range Status   Specimen Description BLOOD SITE NOT SPECIFIED  Final   Special Requests   Final    BOTTLES DRAWN AEROBIC AND ANAEROBIC Blood  Culture results may not be optimal due to an excessive volume of blood received in culture bottles   Culture   Final    NO GROWTH 5 DAYS Performed at [facility], [place] [address], [place], [place] [address]    Report Status 03/05/2022 FINAL  Final  Surgical pcr screen     Status: Abnormal   Collection Time: 03/01/22  9:22 AM   Specimen:  Nasal Mucosa; Nasal Swab  Result Value Ref Range Status   MRSA, PCR POSITIVE (A) NEGATIVE Final    Comment: RESULT CALLED TO, READ BACK BY AND VERIFIED WITH:  Arnell Sieving, RN 03/01/22 1127 A. LAFRANCE    Staphylococcus aureus POSITIVE (A) NEGATIVE Final    Comment: (NOTE) The Xpert SA Assay (FDA approved for NASAL specimens in patients 48 years of age and older), is one component of a comprehensive surveillance program. It is not intended to diagnose infection nor to guide or monitor treatment. Performed at [facility], [place] [address], [place], [place] [address]   Aerobic/Anaerobic Culture w Gram Stain (surgical/deep wound)     Status: None (Preliminary result)   Collection Time: 03/01/22 10:25 AM   Specimen: Abscess  Result Value Ref Range Status   Specimen Description ABSCESS  Final   Special Requests LUMBAR EPIDURAL  Final   Gram Stain   Final    MODERATE WBC PRESENT, PREDOMINANTLY PMN FEW GRAM POSITIVE COCCI IN SINGLES IN PAIRS    Culture   Final    MODERATE METHICILLIN RESISTANT STAPHYLOCOCCUS AUREUS NO ANAEROBES ISOLATED Sent to [place] for further susceptibility testing. Performed at [facility], [place] [address] [place], [place] [address]    Report Status PENDING  Incomplete   Organism ID, Bacteria METHICILLIN RESISTANT STAPHYLOCOCCUS AUREUS  Final      Susceptibility   Methicillin resistant staphylococcus aureus - MIC*    CIPROFLOXACIN >=8 RESISTANT Resistant     ERYTHROMYCIN >=8 RESISTANT Resistant     GENTAMICIN <=0.5 SENSITIVE Sensitive     OXACILLIN >=4 RESISTANT Resistant     TETRACYCLINE >=16 RESISTANT Resistant     VANCOMYCIN 1 SENSITIVE Sensitive     TRIMETH/SULFA >=320 RESISTANT Resistant     CLINDAMYCIN <=0.25 SENSITIVE Sensitive     RIFAMPIN <=0.5 SENSITIVE Sensitive     Inducible Clindamycin NEGATIVE Sensitive     * MODERATE METHICILLIN RESISTANT STAPHYLOCOCCUS AUREUS     Labs:   Basic Metabolic Panel: Recent Labs  Lab  03/02/22 1417 03/03/22 0318 03/04/22 0327 03/05/22 0429 03/06/22 0620  NA 132* 134* 135 133* 138  K 5.0 4.0 3.6 3.5 3.7  CL 102 104 102 101 103  CO2 16* 22 21* 23 25  GLUCOSE 262* 120* 89 104* 128*  BUN 42* 33* 24* 17 15  CREATININE 1.20* 1.00 0.79 0.71 0.82  CALCIUM 9.4 8.9 8.6* 8.3* 8.6*  MG  --   --  1.9 1.9 1.7  PHOS  --   --  3.2 3.5 3.8   Liver Function Tests: Recent Labs  Lab 02/27/22 2148  AST 38  ALT 108*  ALKPHOS 201*  BILITOT 1.0  PROT 7.1  ALBUMIN 2.8*    CBC: Recent Labs  Lab 02/27/22 2206 03/02/22 0712 03/03/22 0318 03/04/22 0327 03/05/22 0429 03/06/22 0620  WBC 18.6* 13.2* 12.0* 11.5* 11.5* 9.8  NEUTROABS 17.8*  --   --   --   --   --   HGB 10.8* 11.4* 11.5* 11.1* 9.4* 9.4*  HCT 32.5*  34.8* 35.5* 34.3* 29.4* 27.6*  MCV 91.3 90.4 91.3 91.7 91.9 90.2  PLT 355 271 312 290 271 245   Cardiac Enzymes: Recent Labs  Lab 03/04/22 0327  CKTOTAL 40    CBG: Recent Labs  Lab 03/05/22 1226 03/05/22 1627 03/05/22 2155 03/06/22 0859 03/06/22 1226  GLUCAP 174* 92 95 167* 103*     IMAGING STUDIES ECHOCARDIOGRAM COMPLETE  Result Date: 03/03/2022    ECHOCARDIOGRAM REPORT   Patient Name:   EEVEE WHITEHORSE Date of Exam: 03/03/2022 Medical Rec #:  [id number]        Height:       67.0 in Accession #:    [id number]       Weight:       175.0 lb Date of Birth:  [birth year]         BSA:          1.911 m Patient Age:    72 years         BP:           149/80 mmHg Patient Gender: F                HR:           79 bpm. Exam Location:  Inpatient Procedure: 2D Echo, Cardiac Doppler and Color Doppler Indications:    epidural abscess  History:        Patient has no prior history of Echocardiogram examinations.                 Risk Factors:Hypertension and Diabetes.  Sonographer:    Phineas Douglas Referring Phys: [id number] [place]  1. Left ventricular ejection fraction, by estimation, is 65 to 70%. The left ventricle has normal function. The left ventricle  has no regional wall motion abnormalities. Left ventricular diastolic parameters are consistent with Grade I diastolic dysfunction (impaired relaxation).  2. Right ventricular systolic function is normal. The right ventricular size is normal. Tricuspid regurgitation signal is inadequate for assessing PA pressure.  3. The mitral valve is grossly normal. Mild mitral valve regurgitation.  4. The aortic valve is tricuspid. Aortic valve regurgitation is not visualized.  5. The inferior vena cava is normal in size with greater than 50% respiratory variability, suggesting right atrial pressure of 3 mmHg. Comparison(s): No prior Echocardiogram. Conclusion(s)/Recommendation(s): No evidence of valvular vegetations on this transthoracic echocardiogram. Consider a transesophageal echocardiogram to exclude infective endocarditis if clinically indicated. FINDINGS  Left Ventricle: Left ventricular ejection fraction, by estimation, is 65 to 70%. The left ventricle has normal function. The left ventricle has no regional wall motion abnormalities. The left ventricular internal cavity size was normal in size. There is  no left ventricular hypertrophy. Left ventricular diastolic parameters are consistent with Grade I diastolic dysfunction (impaired relaxation). Right Ventricle: The right ventricular size is normal. No increase in right ventricular wall thickness. Right ventricular systolic function is normal. Tricuspid regurgitation signal is inadequate for assessing PA pressure. Left Atrium: Left atrial size was normal in size. Right Atrium: Right atrial size was normal in size. Pericardium: There is no evidence of pericardial effusion. Mitral Valve: The mitral valve is grossly normal. There is mild thickening of the mitral valve leaflet(s). There is mild calcification of the mitral valve leaflet(s). Mild mitral annular calcification. Mild mitral valve regurgitation. Tricuspid Valve: The tricuspid valve is normal in structure.  Tricuspid valve regurgitation is not demonstrated. Aortic Valve: The aortic valve is tricuspid. Aortic valve regurgitation is not visualized. Pulmonic Valve:  The pulmonic valve was not well visualized. Pulmonic valve regurgitation is not visualized. Aorta: The aortic root is normal in size and structure. Venous: The inferior vena cava is normal in size with greater than 50% respiratory variability, suggesting right atrial pressure of 3 mmHg. IAS/Shunts: The atrial septum is grossly normal.  LEFT VENTRICLE PLAX 2D LVIDd:         3.60 cm      Diastology LVIDs:         2.20 cm      LV e' medial:    5.77 cm/s LV PW:         1.10 cm      LV E/e' medial:  15.3 LV IVS:        1.00 cm      LV e' lateral:   7.07 cm/s LVOT diam:     1.90 cm      LV E/e' lateral: 12.5 LV SV:         55 LV SV Index:   29 LVOT Area:     2.84 cm  LV Volumes (MOD) LV vol d, MOD A2C: 112.0 ml LV vol d, MOD A4C: 113.0 ml LV vol s, MOD A2C: 35.6 ml LV vol s, MOD A4C: 39.6 ml LV SV MOD A2C:     76.4 ml LV SV MOD A4C:     113.0 ml LV SV MOD BP:      77.3 ml RIGHT VENTRICLE             IVC RV Basal diam:  3.50 cm     IVC diam: 1.40 cm RV S prime:     16.00 cm/s TAPSE (M-mode): 2.0 cm LEFT ATRIUM             Index        RIGHT ATRIUM           Index LA diam:        3.30 cm 1.73 cm/m   RA Area:     11.40 cm LA Vol (A2C):   49.2 ml 25.75 ml/m  RA Volume:   20.80 ml  10.89 ml/m LA Vol (A4C):   48.7 ml 25.49 ml/m LA Biplane Vol: 50.4 ml 26.38 ml/m  AORTIC VALVE LVOT Vmax:   85.80 cm/s LVOT Vmean:  62.200 cm/s LVOT VTI:    0.194 m  AORTA Ao Root diam: 3.30 cm Ao Asc diam:  3.40 cm MITRAL VALVE MV Area (PHT): 3.31 cm     SHUNTS MV Decel Time: 229 msec     Systemic VTI:  0.19 m MV E velocity: 88.10 cm/s   Systemic Diam: 1.90 cm MV A velocity: 114.00 cm/s MV E/A ratio:  0.77 Gwyndolyn Kaufman MD Electronically signed by Gwyndolyn Kaufman MD Signature Date/Time: 03/03/2022/2:04:12 PM    Final    [place] EKG SITE RITE  Result Date: 03/03/2022 If Site Rite  image not attached, placement could not be confirmed due to current cardiac rhythm.  DG Lumbar Spine 2-3 Views  Result Date: 03/01/2022 CLINICAL DATA:  Spine localization EXAM: LUMBAR SPINE - 2-3 VIEW COMPARISON:  02/28/2022 FINDINGS: Two cross-table lateral radiographs of the lumbar spine were obtained intraoperatively, both of which demonstrate surgical instrumentation posterior to the L3-L4 disc space. Please note there is transitional lumbosacral anatomy with sacralization of the L5 segment, as described on previous MRI. IMPRESSION: Surgical instrumentation posterior to the L3-L4 disc space. Electronically Signed   By: Davina Poke D.O.   On: 03/01/2022 13:35  MR Lumbar Spine W Wo Contrast  Result Date: 02/28/2022 CLINICAL DATA:  Spinal stenosis, lumbar neurogenic claudication. Leukocytosis. EXAM: MRI LUMBAR SPINE WITHOUT AND WITH CONTRAST TECHNIQUE: Multiplanar and multiecho pulse sequences of the lumbar spine were obtained without and with intravenous contrast. 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 Incompletely developed disc space at the L5-S1 level. Lowest well developed disc space is labeled as L4-L5. The lowest rib-bearing segment is labeled as T12 (corresponding to axial series 6, image 6). Alignment: Grade 1 anterolisthesis of L3 on L4. Minimal anterolisthesis of L4 on L5. Vertebrae: No acute fracture. No evidence of discitis. Bilateral facet joint effusions at L3-L4 concerning for septic arthritis, particularly on the left (series 4, image 11). Mild subchondral marrow edema which may be reactive. Early changes of osteomyelitis are not excluded. Benign intraosseous hemangiomas most notably within the L4 and L1 vertebral bodies. No suspicious marrow replacing bone lesion. Conus medullaris and cauda equina: Conus extends to the T12-L1 level. Conus  and cauda equina appear normal. Rim enhancing fluid collection within the posterior epidural space spanning from the level of the mid L3 vertebral body to the L5-S1 disc space (series 8, image 9). Approximate dimensions of 1.1 x 0.9 cm transaxially and extending craniocaudally 7.8 cm. Paraspinal and other soft tissues: Lower posterior paraspinal muscle edema. There is a small abscess within the soft tissues adjacent to the L4 spinous process on the left, which appears to be arising from the inferior margin of the left L3-L4 facet joint measuring approximately 2.4 x 1.9 x 2.1 cm (series 8, image 11). Fluid collection versus complex synovial cyst posterior to the right L3-L4 facet joint measuring 1.8 x 1.1 x 1.1 cm (series 6, image 31). No inflammatory changes or fluid collections within the psoas musculature. Disc levels: T12-L1: Mild facet arthropathy.  No foraminal or canal stenosis. L1-L2: Mild facet arthropathy.  No foraminal or canal stenosis. L2-L3: Mild annular disc bulge with mild-to-moderate bilateral facet arthropathy and ligamentum flavum buckling. Mild-to-moderate canal stenosis. No significant foraminal stenosis. L3-L4: Anterolisthesis with disc uncovering and diffuse disc bulge. Severe bilateral facet arthropathy and ligamentum flavum buckling. Posterior epidural space collection. Findings contribute to severe canal stenosis with severe bilateral subarticular recess stenosis and moderate bilateral foraminal stenosis. L4-L5: Broad right foraminal to far lateral disc protrusion. Severe bilateral facet arthropathy with ligamentum flavum buckling. Fluid collection in the posterior epidural space. Findings contribute to moderate canal stenosis with right worse than left subarticular recess stenosis. Moderate right and mild left foraminal stenosis. L5-S1: Transitional level.  No impingement. IMPRESSION: 1. 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 2. Bilateral facet joint  effusions at L3-L4 concerning for septic arthritis, particularly on the left. Mild subchondral marrow edema which may be reactive. Early changes of osteomyelitis are not excluded. 3. 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 Urgent Neurosurgical evaluation recommended. 4. Small abscess within the posterior paraspinal soft tissues likely arising from the left L3-L4 facet joint measuring approximately 2.4 x 1.9 x 2.1 cm. 5. Fluid collection versus complex synovial cyst posterior to the right L3-L4 facet joint measuring 1.8 x 1.1 x 1.1 cm. 6. Severe canal stenosis with severe bilateral subarticular recess stenosis and moderate bilateral foraminal stenosis at L3-L4. Canal stenosis is in part secondary to the epidural abscess at this level. 7. Moderate canal stenosis  at L4-L5 with moderate right and mild left foraminal stenosis. 8. Mild-to-moderate canal stenosis at L2-L3. 9. No findings to suggest discitis at any level within the lumbar spine. These results were called by telephone at the time of interpretation on 02/28/2022 at 5:08 pm to provider Little Ishikawa, MD, who verbally acknowledged these results. Per Dr. Avon Gully, Neurosurgery is also aware of the above findings. Electronically Signed   By: Davina Poke D.O.   On: 02/28/2022 17:10   CT LUMBAR SPINE W CONTRAST  Result Date: 02/27/2022 CLINICAL DATA:  Low back pain EXAM: CT LUMBAR SPINE WITH CONTRAST TECHNIQUE: Multidetector CT imaging of the lumbar spine was performed with intravenous contrast administration. RADIATION DOSE REDUCTION: This exam was performed according to the departmental dose-optimization program which includes automated exposure control, adjustment of the mA and/or kV according to patient size and/or use of iterative reconstruction technique. CONTRAST:  162m OMNIPAQUE IOHEXOL 300 MG/ML  SOLN COMPARISON:  Renal stone CT 12/26/2022. FINDINGS: Segmentation: 5 lumbar type  vertebrae. Alignment: There is 2 mm of anterolisthesis at L3-L4. Alignment is otherwise anatomic. Vertebrae: Bones are osteopenic. There is no acute fracture or focal osseous lesion. Paraspinal and other soft tissues: Paraspinal soft tissues are within normal limits. The bladder is distended. Right ureteral stent is present. There is severe right-sided hydronephrosis in the small amount of air in the right renal collecting system. There are severe atherosclerotic calcifications of the aorta. Disc levels: Mild disc space narrowing and endplate osteophyte formation is seen at L2-L3 and L4-L5 compatible with degenerative change. L2-L3 there is broad-based disc bulge, bilateral facet arthropathy and thickening of the ligamentum flavum causing moderate central canal stenosis. No neural foraminal stenosis. At L3-L4 there is broad-based disc bulge with thickening of the ligamentum flavum and bilateral facet arthropathy with severe central canal stenosis. There is mild right neural foraminal stenosis. At L4-L5 there is thickening of the ligamentum flavum and bilateral facet arthropathy with broad-based disc bulge causing mild central canal stenosis. IMPRESSION: 1. No acute fracture or traumatic subluxation of the lumbar spine. 2. Multilevel degenerative changes of the lumbar spine with severe central canal stenosis at L3-L4 and moderate central canal stenosis at L2-L3. 3. New right ureteral stent with new severe right-sided hydronephrosis and small amount of air in the right renal collecting system. Aortic Atherosclerosis (ICD10-I70.0). Electronically Signed   By: ARonney AstersM.D.   On: 02/27/2022 23:26   DG C-Arm 1-60 Min-No Report  Result Date: 02/27/2022 Fluoroscopy was utilized by the requesting physician.  No radiographic interpretation.   CT Renal Stone Study  Result Date: 02/25/2022 CLINICAL DATA:  Abdominal and flank pain, has cystoscopy with nephrostomy tube placement on RIGHT side for biopsy last week.  Complaining of LEFT side back and flank pain making it difficult to walk EXAM: CT ABDOMEN AND PELVIS WITHOUT CONTRAST TECHNIQUE: Multidetector CT imaging of the abdomen and pelvis was performed following the standard protocol without IV contrast. RADIATION DOSE REDUCTION: This exam was performed according to the departmental dose-optimization program which includes automated exposure control, adjustment of the mA and/or kV according to patient size and/or use of iterative reconstruction technique. COMPARISON:  01/04/2022 FINDINGS: Lower chest: Small bibasilar pulmonary nodules, 3 mm LEFT lower lobe image 7, cavitary nodule RIGHT lower lobe 5 mm image 9, 4 mm RIGHT lower lobe image 18. Nodule versus atelectasis at base of RIGHT major fissure 10 mm image 24. These nodules are essentially unchanged. Hepatobiliary: Gallbladder surgically absent. Biliary dilatation similar to prior exam. No  focal hepatic abnormality. Pancreas: Normal appearance Spleen: Normal appearance Adrenals/Urinary Tract: Adrenal glands normal appearance. Nephrostomy tube coiled in decompressed RIGHT renal pelvis. 11 mm LEFT renal cyst unchanged image 37. No definite urinary tract calcification or dilatation. Excreted contrast within bladder. Ureters unremarkable. Stomach/Bowel: Scattered colonic diverticulosis and stool. No CT evidence of diverticulitis. Stomach decompressed, with inadequate assessment of gastric wall thickness. Small bowel loops unremarkable. Duodenal diverticulum noted. Vascular/Lymphatic: Atherosclerotic calcifications aorta, iliac arteries, coronary arteries, femoral arteries. Aorta normal caliber. Few pelvic phleboliths. No adenopathy. Reproductive: Unremarkable uterus and ovaries Other: No free air or free fluid. No hernia or inflammatory process. Musculoskeletal: Osseous demineralization. IMPRESSION: Nephrostomy tube coiled in decompressed RIGHT renal pelvis. Scattered colonic diverticulosis and stool without evidence of  diverticulitis. Stable bibasilar pulmonary nodules. No acute intra-abdominal or intrapelvic abnormalities. Aortic Atherosclerosis (ICD10-I70.0). Electronically Signed   By: Lavonia Dana M.D.   On: 02/25/2022 11:07    DISCHARGE EXAMINATION: Vitals:   03/06/22 0017 03/06/22 0341 03/06/22 0730 03/06/22 1229  BP: 135/67 (!) 140/68 (!) 133/59 (!) 151/62  Pulse: 81 82 84 82  Resp: 14 14 16 18  $ Temp: 98 F (36.7 C) 97.8 F (36.6 C) 98.4 F (36.9 C) 98.5 F (36.9 C)  TempSrc: Oral Oral Oral Oral  SpO2: 100% 99% 100% 100%  Weight:      Height:       General appearance: Awake alert.  In no distress Resp: Clear to auscultation bilaterally.  Normal effort Cardio: S1-S2 is normal regular.  No S3-S4.  No rubs murmurs or bruit GI: Abdomen is soft.  Nontender nondistended.  Bowel sounds are present normal.  No masses organomegaly    DISPOSITION: Home  Discharge Instructions     [facility] pharmacist to adjust dose for Vancomycin, Aminoglycosides and other anti-infective therapies as requested by physician.   Complete by: As directed    [facility] to provide Cath Flo 26m   Complete by: As directed    Administer for PICC line occlusion and as ordered by physician for other access device issues.   Anaphylaxis Kit: Provided to treat any anaphylactic reaction to the medication being provided to the patient if First Dose or when requested by physician   Complete by: As directed    Epinephrine 124mml vial / amp: Administer 0.108m40m0.108ml3108mubcutaneously once for moderate to severe anaphylaxis, nurse to call physician and pharmacy when reaction occurs and call 911 if needed for immediate care   Diphenhydramine 50mg65mIV vial: Administer 25-50mg 2108mM PRN for first dose reaction, rash, itching, mild reaction, nurse to call physician and pharmacy when reaction occurs   Sodium Chloride 0.9% NS 500ml I45mdminister if needed for hypovolemic blood pressure drop or as ordered by  physician after call to physician with anaphylactic reaction   Call MD for:  difficulty breathing, headache or visual disturbances   Complete by: As directed    Call MD for:  extreme fatigue   Complete by: As directed    Call MD for:  persistant dizziness or light-headedness   Complete by: As directed    Call MD for:  persistant nausea and vomiting   Complete by: As directed    Call MD for:  severe uncontrolled pain   Complete by: As directed    Call MD for:  temperature >100.4   Complete by: As directed    Change dressing on IV access line weekly and PRN   Complete by: As directed    Diet general  Complete by: As directed    Discharge instructions   Complete by: As directed    Take your medications as prescribed.  Please be sure to follow-up with the neurosurgeon and infectious disease specialist.  Follow their instructions regarding wound care.  You were cared for by a hospitalist during your hospital stay. If you have any questions about your discharge medications or the care you received while you were in the hospital after you are discharged, you can call the unit and asked to speak with the hospitalist on call if the hospitalist that took care of you is not available. Once you are discharged, your primary care physician will handle any further medical issues. Please note that NO REFILLS for any discharge medications will be authorized once you are discharged, as it is imperative that you return to your primary care physician (or establish a relationship with a primary care physician if you do not have one) for your aftercare needs so that they can reassess your need for medications and monitor your lab values. If you do not have a primary care physician, you can call [phone] for a physician referral.   Discharge wound care:   Complete by: As directed    As per neurosurgery.   Flush IV access with Sodium Chloride 0.9% and Heparin 10 units/ml or 100 units/ml   Complete by: As  directed    Home infusion instructions - [facility]   Complete by: As directed    Instructions: Flush IV access with Sodium Chloride 0.9% and Heparin 10units/ml or 100units/ml   Change dressing on IV access line: Weekly and PRN   Instructions Cath Flo 43m: Administer for PICC Line occlusion and as ordered by physician for other access device   [facility] pharmacist to adjust dose for: Vancomycin, Aminoglycosides and other anti-infective therapies as requested by physician   Increase activity slowly   Complete by: As directed    Method of administration may be changed at the discretion of home infusion pharmacist based upon assessment of the patient and/or caregiver's ability to self-administer the medication ordered   Complete by: As directed           Allergies as of 03/06/2022       Reactions   Choline Fenofibrate Other (See Comments)   ELEVATED LFT(s)   Penicillins Hives   Ozempic (0.25 Or 0.5 Mg-dose) [semaglutide(0.25 Or 0.541mdos)] Nausea Only, Other (See Comments)   SEVERE NAUSEA   Statins    Aches, pain        Medication List     STOP taking these medications    atorvastatin 10 MG tablet Commonly known as: LIPITOR   methylPREDNISolone 4 MG Tbpk tablet Commonly known as: MEDROL DOSEPAK   OVER THE COUNTER MEDICATION       TAKE these medications    acetaminophen 650 MG CR tablet Commonly known as: TYLENOL Take 650 mg by mouth every 8 (eight) hours as needed for pain.   amLODipine 5 MG tablet Commonly known as: NORVASC Take 5 mg by mouth at bedtime.   aspirin 81 MG chewable tablet Chew 81 mg by mouth at bedtime.   celecoxib 200 MG capsule Commonly known as: CELEBREX Take 1 capsule (200 mg total) by mouth every 12 (twelve) hours for 7 days.   daptomycin  IVPB Commonly known as: CUBICIN Inject 650 mg into the vein daily. Indication:  MRSA epidural abscess First Dose: Yes Last Day of Therapy:  04/12/22 Labs - Once weekly:  CBC/D, BMP, and CPK Labs - Every other week:  ESR and CRP Method of administration: IV Push Pull PICC line at the completion of IV antibiotics Method of administration may be changed at the discretion of home infusion pharmacist based upon assessment of the patient and/or caregiver's ability to self-administer the medication ordered.   diclofenac Sodium 1 % Gel Commonly known as: VOLTAREN Apply 4 g topically 4 (four) times daily.   docusate sodium 100 MG capsule Commonly known as: Colace Take 1 capsule (100 mg total) by mouth daily as needed for up to 30 doses.   glimepiride 4 MG tablet Commonly known as: AMARYL Take 4 mg by mouth daily.   hydrochlorothiazide 25 MG tablet Commonly known as: HYDRODIURIL Take 25 mg by mouth every morning.   losartan 100 MG tablet Commonly known as: COZAAR Take 100 mg by mouth in the morning.   metFORMIN 500 MG 24 hr tablet Commonly known as: GLUCOPHAGE-XR Take 1,000 mg by mouth 2 (two) times daily with a meal.   oxyCODONE-acetaminophen 5-325 MG tablet Commonly known as: Percocet Take 1 tablet by mouth every 4 (four) hours as needed for severe pain.   polyethylene glycol powder 17 GM/SCOOP powder Commonly known as: GLYCOLAX/MIRALAX Dissolve 1 capful(17 g) in water and drink by mouth 2 (two) times daily.   senna 8.6 MG Tabs tablet Commonly known as: SENOKOT Take 1 tablet (8.6 mg total) by mouth 2 (two) times daily.               Durable Medical Equipment  (From admission, onward)           Start     Ordered   03/05/22 1513  For home use only DME Hospital bed  Once       Comments: Needs ttrapeze bar to assist pt in getting up out of bed by self  Question Answer Comment  Length of Need 6 Months   Patient has (list medical condition): epidural abcsess   The above medical condition requires: Patient requires the ability to reposition frequently   Bed type Semi-electric   Trapeze Bar Yes   Support Surface: Gel Overlay       03/05/22 1513              Discharge Care Instructions  (From admission, onward)           Start     Ordered   03/06/22 0000  Change dressing on IV access line weekly and PRN  (Home infusion instructions - [facility] )        03/06/22 1046   03/06/22 0000  Discharge wound care:       Comments: As per neurosurgery.   03/06/22 1046              Follow-up Information     Eustace Moore, MD. Schedule an appointment as soon as possible for a visit in 2 week(s).   Specialty: Neurosurgery Contact information: [address] [place] [address] [facility] up.   Why: Home health agency Contact information: [place] [phone]        Rosiland Oz, MD Follow up on 03/31/2022.   Specialty: Infectious Diseases Why: on 03/31/2022 at 9:45A Contact information: [address] [place] [address] [phone]  TOTAL DISCHARGE TIME: 35 minutes  Bellami Farrelly [employer] on [email or web address]  03/06/2022, 5:25 PM

## 2022-03-06 NOTE — Plan of Care (Signed)
VeRN reviewed AVS with Melissa Leon and Melissa Leon. VeRN answered their questions and gave supportive education. They stated that they understood the discharge instructions and education. They had no more questions at this time.  Problem: Education: Goal: Knowledge of General Education information will improve Description: Including pain rating scale, medication(s)/side effects and non-pharmacologic comfort measures Outcome: Adequate for Discharge   Problem: Health Behavior/Discharge Planning: Goal: Ability to manage health-related needs will improve Outcome: Adequate for Discharge   Problem: Clinical Measurements: Goal: Ability to maintain clinical measurements within normal limits will improve Outcome: Adequate for Discharge Goal: Will remain free from infection Outcome: Adequate for Discharge Goal: Diagnostic test results will improve Outcome: Adequate for Discharge Goal: Respiratory complications will improve Outcome: Adequate for Discharge Goal: Cardiovascular complication will be avoided Outcome: Adequate for Discharge   Problem: Activity: Goal: Risk for activity intolerance will decrease Outcome: Adequate for Discharge   Problem: Nutrition: Goal: Adequate nutrition will be maintained Outcome: Adequate for Discharge   Problem: Coping: Goal: Level of anxiety will decrease Outcome: Adequate for Discharge   Problem: Elimination: Goal: Will not experience complications related to bowel motility Outcome: Adequate for Discharge Goal: Will not experience complications related to urinary retention Outcome: Adequate for Discharge   Problem: Pain Managment: Goal: General experience of comfort will improve Outcome: Adequate for Discharge   Problem: Safety: Goal: Ability to remain free from injury will improve Outcome: Adequate for Discharge   Problem: Skin Integrity: Goal: Risk for impaired skin integrity will decrease Outcome: Adequate for Discharge    Problem: Education: Goal: Ability to describe self-care measures that may prevent or decrease complications (Diabetes Survival Skills Education) will improve Outcome: Adequate for Discharge Goal: Individualized Educational Video(s) Outcome: Adequate for Discharge   Problem: Coping: Goal: Ability to adjust to condition or change in health will improve Outcome: Adequate for Discharge   Problem: Fluid Volume: Goal: Ability to maintain a balanced intake and output will improve Outcome: Adequate for Discharge   Problem: Health Behavior/Discharge Planning: Goal: Ability to identify and utilize available resources and services will improve Outcome: Adequate for Discharge Goal: Ability to manage health-related needs will improve Outcome: Adequate for Discharge   Problem: Metabolic: Goal: Ability to maintain appropriate glucose levels will improve Outcome: Adequate for Discharge   Problem: Nutritional: Goal: Maintenance of adequate nutrition will improve Outcome: Adequate for Discharge Goal: Progress toward achieving an optimal weight will improve Outcome: Adequate for Discharge   Problem: Skin Integrity: Goal: Risk for impaired skin integrity will decrease Outcome: Adequate for Discharge   Problem: Tissue Perfusion: Goal: Adequacy of tissue perfusion will improve Outcome: Adequate for Discharge   Problem: Education: Goal: Ability to verbalize activity precautions or restrictions will improve Outcome: Adequate for Discharge Goal: Knowledge of the prescribed therapeutic regimen will improve Outcome: Adequate for Discharge Goal: Understanding of discharge needs will improve Outcome: Adequate for Discharge   Problem: Activity: Goal: Ability to avoid complications of mobility impairment will improve Outcome: Adequate for Discharge Goal: Ability to tolerate increased activity will improve Outcome: Adequate for Discharge Goal: Will remain free from falls Outcome: Adequate for  Discharge   Problem: Bowel/Gastric: Goal: Gastrointestinal status for postoperative course will improve Outcome: Adequate for Discharge   Problem: Clinical Measurements: Goal: Ability to maintain clinical measurements within normal limits will improve Outcome: Adequate for Discharge Goal: Postoperative complications will be avoided or minimized Outcome: Adequate for Discharge Goal: Diagnostic test results will improve Outcome: Adequate for Discharge   Problem: Pain Management: Goal: Pain level will  decrease Outcome: Adequate for Discharge   Problem: Skin Integrity: Goal: Will show signs of wound healing Outcome: Adequate for Discharge   Problem: Health Behavior/Discharge Planning: Goal: Identification of resources available to assist in meeting health care needs will improve Outcome: Adequate for Discharge   Problem: Bladder/Genitourinary: Goal: Urinary functional status for postoperative course will improve Outcome: Adequate for Discharge   Problem: Acute Rehab PT Goals(only PT should resolve) Goal: Pt Will Go Supine/Side To Sit Outcome: Adequate for Discharge Goal: Patient Will Transfer Sit To/From Stand Outcome: Adequate for Discharge Goal: Pt Will Ambulate Outcome: Adequate for Discharge Goal: Pt Will Go Up/Down Stairs Outcome: Adequate for Discharge Goal: Pt Will Verbalize and Adhere to Precautions While Description: PT Will Verbalize and Adhere to Precautions While Performing Mobility Outcome: Adequate for Discharge   Problem: Acute Rehab OT Goals (only OT should resolve) Goal: Pt. Will Perform Grooming Outcome: Adequate for Discharge Goal: Pt. Will Perform Lower Body Dressing Outcome: Adequate for Discharge Goal: Pt. Will Transfer To Toilet Outcome: Adequate for Discharge Goal: Pt. Will Perform Toileting-Clothing Manipulation Outcome: Adequate for Discharge Goal: Pt. Will Perform Tub/Shower Transfer Outcome: Adequate for Discharge

## 2022-03-09 LAB — MISC LABCORP TEST (SEND OUT)
LabCorp test name: 1
Labcorp test code: 96388

## 2022-03-13 LAB — AEROBIC/ANAEROBIC CULTURE W GRAM STAIN (SURGICAL/DEEP WOUND)

## 2022-03-17 ENCOUNTER — Telehealth: Payer: Self-pay

## 2022-03-17 NOTE — Telephone Encounter (Signed)
I would make sure the bleeding from PICC is controlled. If not, she would need to go to ED.

## 2022-03-17 NOTE — Telephone Encounter (Signed)
Received call in triage from [facility] RN regarding abnormal labs  white blood cell count: 11.6 RBC: 3.01 Hemoglobin: 8.9( decrease from last value) Hemoglobin & Hematocrit     Component Value Date/Time   HGB 9.4 (L) 03/06/2022 0620   HCT 27.6 (L) 03/06/2022 [id number]     Nurse was concerned  with bleeding at picc site and had to change dressing x2. Reports no dark urine/stool  Routing to provider and pharmacy team for advise.  Eugenia Mcalpine, LPN

## 2022-03-19 ENCOUNTER — Inpatient Hospital Stay (HOSPITAL_COMMUNITY)
Admission: EM | Admit: 2022-03-19 | Discharge: 2022-03-25 | DRG: 871 | Disposition: A | Discharge status: Home Health | Payer: Medicare HMO | Attending: Internal Medicine | Admitting: Internal Medicine

## 2022-03-19 ENCOUNTER — Emergency Department (HOSPITAL_COMMUNITY): Payer: Medicare HMO

## 2022-03-19 ENCOUNTER — Encounter (HOSPITAL_COMMUNITY): Payer: Self-pay | Admitting: Emergency Medicine

## 2022-03-19 ENCOUNTER — Other Ambulatory Visit: Payer: Self-pay

## 2022-03-19 DIAGNOSIS — E119 Type 2 diabetes mellitus without complications: Secondary | ICD-10-CM

## 2022-03-19 DIAGNOSIS — Z87891 Personal history of nicotine dependence: Secondary | ICD-10-CM | POA: Diagnosis not present

## 2022-03-19 DIAGNOSIS — N136 Pyonephrosis: Secondary | ICD-10-CM | POA: Diagnosis present

## 2022-03-19 DIAGNOSIS — N390 Urinary tract infection, site not specified: Secondary | ICD-10-CM | POA: Diagnosis not present

## 2022-03-19 DIAGNOSIS — B9689 Other specified bacterial agents as the cause of diseases classified elsewhere: Secondary | ICD-10-CM

## 2022-03-19 DIAGNOSIS — F32A Depression, unspecified: Secondary | ICD-10-CM | POA: Diagnosis present

## 2022-03-19 DIAGNOSIS — Z79899 Other long term (current) drug therapy: Secondary | ICD-10-CM

## 2022-03-19 DIAGNOSIS — A419 Sepsis, unspecified organism: Principal | ICD-10-CM | POA: Diagnosis present

## 2022-03-19 DIAGNOSIS — G061 Intraspinal abscess and granuloma: Secondary | ICD-10-CM

## 2022-03-19 DIAGNOSIS — J9601 Acute respiratory failure with hypoxia: Secondary | ICD-10-CM | POA: Diagnosis present

## 2022-03-19 DIAGNOSIS — I10 Essential (primary) hypertension: Secondary | ICD-10-CM | POA: Diagnosis present

## 2022-03-19 DIAGNOSIS — E871 Hypo-osmolality and hyponatremia: Secondary | ICD-10-CM | POA: Diagnosis present

## 2022-03-19 DIAGNOSIS — R5381 Other malaise: Secondary | ICD-10-CM | POA: Diagnosis present

## 2022-03-19 DIAGNOSIS — Z9889 Other specified postprocedural states: Secondary | ICD-10-CM | POA: Diagnosis not present

## 2022-03-19 DIAGNOSIS — Z7984 Long term (current) use of oral hypoglycemic drugs: Secondary | ICD-10-CM | POA: Diagnosis not present

## 2022-03-19 DIAGNOSIS — N3000 Acute cystitis without hematuria: Secondary | ICD-10-CM | POA: Diagnosis present

## 2022-03-19 DIAGNOSIS — R202 Paresthesia of skin: Secondary | ICD-10-CM | POA: Diagnosis present

## 2022-03-19 DIAGNOSIS — J188 Other pneumonia, unspecified organism: Secondary | ICD-10-CM | POA: Insufficient documentation

## 2022-03-19 DIAGNOSIS — J189 Pneumonia, unspecified organism: Secondary | ICD-10-CM | POA: Diagnosis present

## 2022-03-19 DIAGNOSIS — Z1152 Encounter for screening for COVID-19: Secondary | ICD-10-CM

## 2022-03-19 DIAGNOSIS — Z8614 Personal history of Methicillin resistant Staphylococcus aureus infection: Secondary | ICD-10-CM | POA: Diagnosis not present

## 2022-03-19 DIAGNOSIS — N135 Crossing vessel and stricture of ureter without hydronephrosis: Secondary | ICD-10-CM | POA: Diagnosis present

## 2022-03-19 LAB — CBC WITH DIFFERENTIAL/PLATELET
Abs Immature Granulocytes: 0.07 10*3/uL (ref 0.00–0.07)
Basophils Absolute: 0.1 10*3/uL (ref 0.0–0.1)
Basophils Relative: 0 %
Eosinophils Absolute: 0.4 10*3/uL (ref 0.0–0.5)
Eosinophils Relative: 3 %
HCT: 29.2 % — ABNORMAL LOW (ref 36.0–46.0)
Hemoglobin: 9.6 g/dL — ABNORMAL LOW (ref 12.0–15.0)
Immature Granulocytes: 1 %
Lymphocytes Relative: 8 %
Lymphs Abs: 1.2 10*3/uL (ref 0.7–4.0)
MCH: 29.8 pg (ref 26.0–34.0)
MCHC: 32.9 g/dL (ref 30.0–36.0)
MCV: 90.7 fL (ref 80.0–100.0)
Monocytes Absolute: 1.2 10*3/uL — ABNORMAL HIGH (ref 0.1–1.0)
Monocytes Relative: 8 %
Neutro Abs: 11.8 10*3/uL — ABNORMAL HIGH (ref 1.7–7.7)
Neutrophils Relative %: 80 %
Platelets: 288 10*3/uL (ref 150–400)
RBC: 3.22 MIL/uL — ABNORMAL LOW (ref 3.87–5.11)
RDW: 14.3 % (ref 11.5–15.5)
WBC: 14.6 10*3/uL — ABNORMAL HIGH (ref 4.0–10.5)
nRBC: 0 % (ref 0.0–0.2)

## 2022-03-19 LAB — URINALYSIS, ROUTINE W REFLEX MICROSCOPIC
Bilirubin Urine: NEGATIVE
Glucose, UA: NEGATIVE mg/dL
Ketones, ur: NEGATIVE mg/dL
Nitrite: POSITIVE — AB
Protein, ur: 30 mg/dL — AB
Specific Gravity, Urine: 1.02 (ref 1.005–1.030)
pH: 6.5 (ref 5.0–8.0)

## 2022-03-19 LAB — COMPREHENSIVE METABOLIC PANEL
ALT: 25 U/L (ref 0–44)
AST: 30 U/L (ref 15–41)
Albumin: 3 g/dL — ABNORMAL LOW (ref 3.5–5.0)
Alkaline Phosphatase: 113 U/L (ref 38–126)
Anion gap: 11 (ref 5–15)
BUN: 21 mg/dL (ref 8–23)
CO2: 24 mmol/L (ref 22–32)
Calcium: 8.6 mg/dL — ABNORMAL LOW (ref 8.9–10.3)
Chloride: 95 mmol/L — ABNORMAL LOW (ref 98–111)
Creatinine, Ser: 1.04 mg/dL — ABNORMAL HIGH (ref 0.44–1.00)
GFR, Estimated: 56 mL/min — ABNORMAL LOW (ref >60)
Glucose, Bld: 100 mg/dL — ABNORMAL HIGH (ref 70–99)
Potassium: 3.7 mmol/L (ref 3.5–5.1)
Sodium: 130 mmol/L — ABNORMAL LOW (ref 135–145)
Total Bilirubin: 1.2 mg/dL (ref 0.3–1.2)
Total Protein: 6.9 g/dL (ref 6.5–8.1)

## 2022-03-19 LAB — URINALYSIS, MICROSCOPIC (REFLEX): WBC, UA: >50 WBC/hpf (ref 0–5)

## 2022-03-19 LAB — LACTIC ACID, PLASMA
Lactic Acid, Venous: 1.9 mmol/L (ref 0.5–1.9)
Lactic Acid, Venous: 1.4 mmol/L (ref 0.5–1.9)

## 2022-03-19 LAB — RESP PANEL BY RT-PCR (RSV, FLU A&B, COVID)  RVPGX2
Influenza A by PCR: NEGATIVE
Influenza B by PCR: NEGATIVE
Resp Syncytial Virus by PCR: NEGATIVE
SARS Coronavirus 2 by RT PCR: NEGATIVE

## 2022-03-19 MED ORDER — SODIUM CHLORIDE 0.9 % IV SOLN: 650.0000 mg | Freq: Every day | INTRAVENOUS | Status: DC

## 2022-03-19 MED ORDER — ACETAMINOPHEN 325 MG PO TABS
650.0000 mg | ORAL_TABLET | Freq: Four times a day (QID) | ORAL | Status: DC | PRN
  Administered 2022-03-20 – 2022-03-23 (×4): 650 mg via ORAL
  Filled 2022-03-19 (×4): qty 2

## 2022-03-19 MED ORDER — SODIUM CHLORIDE 0.9 % IV BOLUS
1000.0000 mL | Freq: Once | INTRAVENOUS | Status: AC
  Administered 2022-03-19: 1000 mL via INTRAVENOUS

## 2022-03-19 MED ORDER — VANCOMYCIN HCL 1500 MG/300ML IV SOLN
1500.0000 mg | Freq: Once | INTRAVENOUS | Status: AC
  Administered 2022-03-20: 1500 mg via INTRAVENOUS
  Filled 2022-03-19: qty 300

## 2022-03-19 MED ORDER — AZITHROMYCIN 500 MG PO TABS
500.0000 mg | ORAL_TABLET | Freq: Every day | ORAL | Status: DC
  Administered 2022-03-20: 500 mg via ORAL
  Filled 2022-03-19: qty 1

## 2022-03-19 MED ORDER — VANCOMYCIN HCL IN DEXTROSE 1-5 GM/200ML-% IV SOLN: 1000.0000 mg | INTRAVENOUS | Status: DC

## 2022-03-19 MED ORDER — ACETAMINOPHEN 500 MG PO TABS
1000.0000 mg | ORAL_TABLET | Freq: Once | ORAL | Status: AC
  Administered 2022-03-20: 1000 mg via ORAL
  Filled 2022-03-19: qty 2

## 2022-03-19 MED ORDER — SODIUM CHLORIDE 0.9 % IV SOLN
2.0000 g | Freq: Once | INTRAVENOUS | Status: AC
  Administered 2022-03-19: 2 g via INTRAVENOUS
  Filled 2022-03-19: qty 12.5

## 2022-03-19 MED ORDER — SODIUM CHLORIDE 0.9 % IV SOLN: 2.0000 g | Freq: Three times a day (TID) | INTRAVENOUS | Status: DC

## 2022-03-19 NOTE — Progress Notes (Signed)
Pharmacy Antibiotic Note  Melissa Leon is a 77 y.o. 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  Pharmacy has been consulted for daptomycin dosing.  Last dose administered 2/29 '@1900'$  prior to arrival  Plan: Continue Daptomycin 650 mg IV q 24h Monitor renal function, ID recs, CK  Height: '5\' 7"'$  (170.2 cm) Weight: 73 kg (161 lb) IBW/kg (Calculated) : 61.6  Temp (24hrs), Avg:101.1 F (38.4 C), Min:100.6 F (38.1 C), Max:101.5 F (38.6 C)  Recent Labs  Lab 03/19/22 1951  CREATININE 1.04*  LATICACIDVEN 1.9    Estimated Creatinine Clearance: 44.8 mL/min (A) (by C-G formula based on SCr of 1.04 mg/dL (H)).    Allergies  Allergen Reactions   Choline Fenofibrate Other (See Comments)    ELEVATED LFT(s)   Penicillins Hives   Ozempic (0.25 Or 0.5 Mg-Dose) [Semaglutide(0.25 Or 0.'5mg'$ -Dos)] Nausea Only and Other (See Comments)    SEVERE NAUSEA   Statins     Aches, pain    Bertis Ruddy, PharmD, [place] Pharmacist ED Pharmacist Phone # [phone] 03/19/2022 9:43 PM

## 2022-03-19 NOTE — Subjective & Objective (Signed)
CC: fever, weakness HPI: 77 year old female history of diabetes, hypertension with a recent decline in her health.  This all started in December 2023. She was admitted to the hospital on January 04, 2022.  She was found to have an extremely enlarged right kidney.  With severe hydronephrosis without evidence of hydroureter.  She was seen in consultation by urology.  She underwent percutaneous nephrostomy tube placement on 01/05/2022.  She was discharged home.  In January 2024, she followed up with urology in the outpatient setting.  She was noted to have an infection at the entry site of her nephrostomy tube.  She was placed on oral Bactrim at that time.  She followed up with urology in February 2024.  She ended having a cystoscopy along with a right double-J stent placement and her nephrostomy tube was removed.  This occurred on February 27, 2022.  After discharge from outpatient surgery, family noted that she was extremely weak and brought her back to the hospital.  She was admitted to the hospital.  The next day, she underwent MRI scanning which showed an epidural abscess near her lumbar spine.  She underwent lumbar laminectomy on 03/01/2022.  Cultures grew out MRSA.  Echocardiogram was negative for endocarditis.  She was seen by infectious disease during that admission.  PICC line was placed and she was discharged to home on IV daptomycin.  Today, the daughter took the patient to neurosurgery for follow-up.  Daughter states the patient's been having lots of urinary frequency but no pain.  Patient started having weakness about 2 days ago and been having difficulty walking.  She also spiked a fever today.  Daughter brought her back to the ER for evaluation.  On arrival temp 101.5 heart rate 108 blood pressure 155/70 satting 96% on room air.  Labs:  White count 14.6, hemoglobin 9.6, platelets of 288  Sodium 130, potassium 3.7, chloride 95, bicarb of 24, BUN of 21, creatinine 1.0  COVID-negative,  influenza negative, RSV negative.  CT abdomen pelvis demonstrates continued right ureteral stent.  Chest x-ray demonstrated new multifocal pneumonia left greater than right.  [facility] hospitalist contacted for admission.

## 2022-03-19 NOTE — ED Provider Notes (Signed)
[facility] Provider Note   CSN: [id number] Arrival date & time: 03/19/22  1850     History  Chief Complaint  Patient presents with   Fever   Weakness    Melissa Leon is a 77 y.o. female.  Presents the ER today complaining of feeling generally unwell, development of fever and malaise.  Has been having some mid back and flank pain since having her right ureteral stent removed 6 days ago.  She went to her neurosurgeon for follow-up and Steri-Strip removal today and states everything was normal at that time and she had normal vital signs.  Moved her Steri-Strips and said the site looked good and they will follow-up with her in a couple weeks.  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    Fever Weakness Associated symptoms: fever        Home Medications Prior to Admission medications   Medication Sig Start Date End Date Taking? Authorizing Provider  acetaminophen (TYLENOL) 650 MG CR tablet Take 650 mg by mouth every 8 (eight) hours as needed for pain.    [provider]  amLODipine (NORVASC) 5 MG tablet Take 5 mg by mouth at bedtime. 03/27/21   [provider]  aspirin 81 MG chewable tablet Chew 81 mg by mouth at bedtime.    [provider]  daptomycin (CUBICIN) IVPB Inject 650 mg into the vein daily. Indication:  MRSA epidural abscess First Dose: Yes Last Day of Therapy:  04/12/22 Labs - Once weekly:  CBC/D, BMP, and CPK Labs - Every other week:  ESR and CRP Method of administration: IV Push Pull PICC line at the completion of IV antibiotics Method of administration may be changed at the discretion of home infusion pharmacist based upon assessment of the patient and/or caregiver's ability to self-administer the medication ordered. 03/06/22 04/14/22  Bonnielee Haff, MD  diclofenac Sodium (VOLTAREN) 1 % GEL  Apply 4 g topically 4 (four) times daily. 02/25/22   Deno Etienne, DO  docusate sodium (COLACE) 100 MG capsule Take 1 capsule (100 mg total) by mouth daily as needed for up to 30 doses. 02/27/22   Janith Lima, MD  glimepiride (AMARYL) 4 MG tablet Take 4 mg by mouth daily. 01/28/22   [provider]  hydrochlorothiazide (HYDRODIURIL) 25 MG tablet Take 25 mg by mouth every morning. 01/15/22   [provider]  losartan (COZAAR) 100 MG tablet Take 100 mg by mouth in the morning. 04/21/21   [provider]  metFORMIN (GLUCOPHAGE-XR) 500 MG 24 hr tablet Take 1,000 mg by mouth 2 (two) times daily with a meal. 03/27/21   [provider]  oxyCODONE-acetaminophen (PERCOCET) 5-325 MG tablet Take 1 tablet by mouth every 4 (four) hours as needed for severe pain. 03/06/22   Bonnielee Haff, MD  polyethylene glycol powder (GLYCOLAX/MIRALAX) 17 GM/SCOOP powder Dissolve 1 capful(17 g) in water and drink by mouth 2 (two) times daily. 03/06/22   Bonnielee Haff, MD  senna (SENOKOT) 8.6 MG TABS tablet Take 1 tablet (8.6 mg total) by mouth 2 (two) times daily. 03/06/22   Bonnielee Haff, MD      Allergies    Choline fenofibrate, Penicillins, Ozempic (0.25 or 0.5 mg-dose) [semaglutide(0.25 or 0.'5mg'$ -dos)], and Statins    Review of Systems   Review of Systems  Constitutional:  Positive for fever.  Neurological:  Positive for  weakness.    Physical Exam Updated Vital Signs BP 133/63   Pulse (!) 101   Temp 99.6 F (37.6 C)   Resp (!) 21   Ht '5\' 7"'$  (1.702 m)   Wt 73 kg   SpO2 97%   BMI 25.22 kg/m  Physical Exam Vitals and nursing note reviewed.  Constitutional:      General: She is not in acute distress.    Appearance: She is well-developed.  HENT:     Head: Normocephalic and atraumatic.     Mouth/Throat:     Mouth: Mucous membranes are moist.  Eyes:     Extraocular Movements: Extraocular movements intact.     Conjunctiva/sclera: Conjunctivae normal.     Pupils: Pupils are  equal, round, and reactive to light.  Cardiovascular:     Rate and Rhythm: Regular rhythm. Tachycardia present.     Heart sounds: No murmur heard. Pulmonary:     Effort: Pulmonary effort is normal. No respiratory distress.     Breath sounds: Normal breath sounds.  Abdominal:     General: There is no distension.     Palpations: Abdomen is soft. There is no mass.     Tenderness: There is no abdominal tenderness.     Comments: Mild right CVA tenderness  Musculoskeletal:        General: No swelling or tenderness.     Cervical back: Neck supple.     Comments: Lumbar incision healing well with no cellulitis or drainage  Skin:    General: Skin is warm and dry.     Capillary Refill: Capillary refill takes less than 2 seconds.  Neurological:     General: No focal deficit present.     Mental Status: She is alert and oriented to person, place, and time.  Psychiatric:        Mood and Affect: Mood normal.     ED Results / Procedures / Treatments   Labs (all labs ordered are listed, but only abnormal results are displayed) Labs Reviewed  COMPREHENSIVE METABOLIC PANEL - Abnormal; Notable for the following components:      Result Value   Sodium 130 (*)    Chloride 95 (*)    Glucose, Bld 100 (*)    Creatinine, Ser 1.04 (*)    Calcium 8.6 (*)    Albumin 3.0 (*)    GFR, Estimated 56 (*)    All other components within normal limits  URINALYSIS, ROUTINE W REFLEX MICROSCOPIC - Abnormal; Notable for the following components:   APPearance CLOUDY (*)    Hgb urine dipstick SMALL (*)    Protein, ur 30 (*)    Nitrite POSITIVE (*)    Leukocytes,Ua MODERATE (*)    All other components within normal limits  URINALYSIS, MICROSCOPIC (REFLEX) - Abnormal; Notable for the following components:   Bacteria, UA MANY (*)    All other components within normal limits  CULTURE, BLOOD (ROUTINE X 2)  CULTURE, BLOOD (ROUTINE X 2)  URINE CULTURE  RESP PANEL BY RT-PCR (RSV, FLU A&B, COVID)  RVPGX2  LACTIC  ACID, PLASMA  CBC WITH DIFFERENTIAL/PLATELET  LACTIC ACID, PLASMA  CBC WITH DIFFERENTIAL/PLATELET  CBC WITH DIFFERENTIAL/PLATELET  URINALYSIS, W/ REFLEX TO CULTURE (INFECTION SUSPECTED)    EKG EKG Interpretation  Date/Time:  Thursday March 19 2022 19:38:07 EST Ventricular Rate:  107 PR Interval:  162 QRS Duration: 72 QT Interval:  318 QTC Calculation: 424 R Axis:   43 Text Interpretation: Sinus tachycardia Otherwise normal ECG When compared  with ECG of 04-Jan-2022 13:07, PREVIOUS ECG IS PRESENT Confirmed by Dene Gentry [phone]) on 03/19/2022 9:25:19 PM  Radiology DG Chest 1 View  Result Date: 03/19/2022 CLINICAL DATA:  Weakness EXAM: CHEST  1 VIEW COMPARISON:  Chest x-ray 12/10/2018 FINDINGS: There is a moderate amount of patchy airspace consolidation in the left mid and upper lung. There is also faint patchy airspace disease in the right lung base. There is no pleural effusion or pneumothorax. Cardiomediastinal silhouette is within normal limits. Right-sided central venous catheter tip projects over the SVC. No acute fractures are seen. IMPRESSION: Bilateral airspace disease, left greater than right. Findings are concerning for multifocal pneumonia. Follow-up PA and lateral chest x-ray recommended in 4-6 weeks to confirm resolution. Electronically Signed   By: Ronney Asters M.D.   On: 03/19/2022 20:41   CT ABDOMEN PELVIS WO CONTRAST  Result Date: 03/19/2022 CLINICAL DATA:  Recent procedures of nephrostomy tube insertion and removal and removal of spinal abscess. Presenting with fever and weakness. Currently receiving daptomycin for infection. EXAM: CT ABDOMEN AND PELVIS WITHOUT CONTRAST TECHNIQUE: Multidetector CT imaging of the abdomen and pelvis was performed following the standard protocol without IV contrast. RADIATION DOSE REDUCTION: This exam was performed according to the departmental dose-optimization program which includes automated exposure control, adjustment of the mA  and/or kV according to patient size and/or use of iterative reconstruction technique. COMPARISON:  CT 02/25/2022 FINDINGS: Lower chest: Patchy airspace opacities in the bilateral lower lung suspicious for pneumonia. Hepatobiliary: Unremarkable liver and biliary tree. Cholecystectomy. Pancreas: Unremarkable. Spleen: Unremarkable. Adrenals/Urinary Tract: Normal adrenal glands. Ureteral stent in the right ureter. Interval removal of the right nephrostomy tube with new dilation of the right renal pelvis. No urinary calculi. Unremarkable bladder. Stomach/Bowel: Normal caliber large and small bowel. Colonic diverticulosis without diverticulitis. Moderate colonic stool load. The appendix is not visualized. Vascular/Lymphatic: Aortic atherosclerotic calcification. No lymphadenopathy. Reproductive: Unremarkable uterus and ovaries. Other: No free intraperitoneal fluid or air. Musculoskeletal: No acute osseous abnormality. IMPRESSION: 1. Patchy airspace opacities in the bilateral lower lung suspicious for pneumonia. 2. Interval removal of the right nephrostomy tube with new development of dilation of the right pelvis. Recommend correlation for proper functioning of the right ureteral stent. Aortic Atherosclerosis (ICD10-I70.0). Electronically Signed   By: Placido Sou M.D.   On: 03/19/2022 20:27    Procedures .Critical Care  Performed by: Gwenevere Abbot, PA-C Authorized by: Gwenevere Abbot, PA-C   Critical care provider statement:    Critical care time (minutes):  30   Critical care was necessary to treat or prevent imminent or life-threatening deterioration of the following conditions:  Sepsis   Critical care was time spent personally by me on the following activities:  Development of treatment plan with patient or surrogate, discussions with consultants, evaluation of patient's response to treatment, examination of patient, ordering and review of laboratory studies, ordering and review of radiographic  studies, ordering and performing treatments and interventions, pulse oximetry, re-evaluation of patient's condition, review of old charts and obtaining history from patient or surrogate   Care discussed with: admitting provider       Medications Ordered in ED Medications  acetaminophen (TYLENOL) tablet 650 mg (has no administration in time range)  ceFEPIme (MAXIPIME) 2 g in sodium chloride 0.9 % 100 mL IVPB (2 g Intravenous New Bag/Given 03/19/22 2217)  DAPTOmycin (CUBICIN) 650 mg in sodium chloride 0.9 % IVPB (has no administration in time range)  sodium chloride 0.9 % bolus 1,000 mL (1,000 mLs Intravenous New  Bag/Given 03/19/22 2223)    ED Course/ Medical Decision Making/ A&P                             Medical Decision Making This patient presents to the ED for concern of fever, malaise, flank pain, this involves an extensive number of treatment options, and is a complaint that carries with it a high risk of complications and morbidity.  The differential diagnosis includes sepsis, pyelonephritis, UTI, ureteral obstruction, epidural abscess, pneumonia, other   Co morbidities that complicate the patient evaluation  Recent surgery for epidural abscess,   Additional history obtained:  Additional history obtained from EMR External records from outside source obtained and reviewed including neurosurgery notes, urology notes, prior hospitalist notes   Lab Tests:  I Ordered, and personally interpreted labs.  The pertinent results include:  CBC-pending CMP- mild hyponatremia UA-significant for UTI Lactic acid-1.9   Imaging Studies ordered:  I ordered imaging studies including x-ray and CT abdomen/pelvis I independently visualized and interpreted imaging which showed patchy infiltrates on chest x-ray, CT shows dilation of right renal pelvis I agree with the radiologist interpretation   Cardiac Monitoring: / EKG:  The patient was maintained on a cardiac monitor.  I personally  viewed and interpreted the cardiac monitored which showed an underlying rhythm of: Sinus tachycardia   Consultations Obtained:  I requested consultation with the hospitalist Dr. Bridgett Larsson,  and discussed lab and imaging findings as well as pertinent plan - they recommend: He is agreeable with admission but would like a cath urine specimen with repeat UA and culture  Pharmacist also consulted, patient already had daptomycin today so vancomycin was discontinued at his recommendation and he is going to continue her daptomycin orders   Problem List / ED Course / Critical interventions / Medication management  Patient here with malaise fever fatigue in the setting of being on daptomycin for epidural abscess status post evacuation of this.  She has a right ureteral stent in place with recent removal on February 9 nephrostomy tube and placement of the right ureteral stent.  She was noted to be febrile and tachycardic on arrival, she is not toxic in appearance but does meet sepsis criteria based on vital signs and UTI and pneumonia on chest x-ray.  She does admit to a dry cough but no shortness of breath.  Given multiple sources of infection, PICC line in place on daptomycin patient needs to be admitted for broadened spectrum antibiotics and further care. I ordered medication including cefepime for UTI and pneumonia, saline ordered for tachycardia Reevaluation of the patient after these medicines showed that the patient improved I have reviewed the patients home medicines and have made adjustments as needed    Test / Admission - Considered:  Surgery CT chest but patient has no chest pain, shortness of breath or hypoxia, would not change management in the ED             Final Clinical Impression(s) / ED Diagnoses Final diagnoses:  None    Rx / DC Orders ED Discharge Orders     None         Darci Current 03/19/22 2227    Valarie Merino, MD 03/20/22 1459

## 2022-03-19 NOTE — Assessment & Plan Note (Signed)
Was discharged on daptomycin via picc line.  Will need to change over to vanco to cover for potential staph pneumonia.

## 2022-03-19 NOTE — Assessment & Plan Note (Signed)
Unclear the etiology of her pneumonia.  Patient's been on daptomycin which does not treat staph pneumonia.  Continue with IV cefepime.  Switch her over to IV vancomycin.  She will need ID consult in the morning.  I will send a secure chat to Dr. Linus Salmons to see the patient.

## 2022-03-19 NOTE — Assessment & Plan Note (Addendum)
Now on supplemental O2 due to pneumonia. Change to progressive care bed. Pt is a FULL CODE

## 2022-03-19 NOTE — Assessment & Plan Note (Signed)
Was seen today by neurosurgery in the office.

## 2022-03-19 NOTE — ED Triage Notes (Signed)
Pt c/o increased weakness and fever of 102 today. Recent nephrostomy tube removed on 2/9 and recent spinal abscess operated on. PICC line in place receiving antibiotics for MRSA infection.

## 2022-03-19 NOTE — Assessment & Plan Note (Signed)
Admit to progressive care bed.  Start IV cefepime.  Send cath urine and culture.

## 2022-03-19 NOTE — ED Provider Triage Note (Signed)
Emergency Medicine Provider Triage Evaluation Note  Melissa Leon , a 77 y.o. female  was evaluated in triage.  Pt complains of fever and weakness.  Patient reports that she recently has had procedures done for a nephrostomy tube insertion and removal as well is removal of a spinal abscess.  Patient has PICC line in place and has been seeing IV antibiotics for MRSA infection.  Patient currently receiving daptomycin.  Denies any chest pain, shortness of breath, dysuria.  Review of Systems  Positive: As above Negative: As above  Physical Exam  BP (!) 155/70 (BP Location: Left Arm)   Pulse (!) 108   Temp (!) 101.5 F (38.6 C) (Oral)   Resp 18   Ht '5\' 7"'$  (1.702 m)   Wt 73 kg   SpO2 96%   BMI 25.22 kg/m  Gen:   Awake, no distress   Resp:  Normal effort  MSK:   Moves extremities without difficulty  Other:  No TTP in abdomen.  Surgical site lumbar spine with some minor erythema surrounding, not evidently cellulitic  Medical Decision Making  Medically screening exam initiated at 7:20 PM.  Appropriate orders placed.  Melissa Leon was informed that the remainder of the evaluation will be completed by another provider, this initial triage assessment does not replace that evaluation, and the importance of remaining in the ED until their evaluation is complete.     Luvenia Heller, PA-C 03/19/22 1922

## 2022-03-19 NOTE — Assessment & Plan Note (Signed)
Hold losartan since we are starting vanco. Pt does have LE edema now. Will hold norvasc.

## 2022-03-19 NOTE — Assessment & Plan Note (Signed)
Possibly due to HCTZ. Will hold.

## 2022-03-19 NOTE — ED Notes (Signed)
MD and RT notified of patient's new oxygen requirement. MD is increasing level of care and RT is on way to room.

## 2022-03-19 NOTE — Assessment & Plan Note (Signed)
Hold metformin. Add ssi.

## 2022-03-19 NOTE — Assessment & Plan Note (Signed)
Still has ureteral stent from 02-27-2021. Follows with Dr. Abner Greenspan with [facility].

## 2022-03-19 NOTE — Progress Notes (Addendum)
Pharmacy Antibiotic Note  Melissa Leon is a 77 y.o. 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  Pharmacy initially consulted for daptomycin dosing, but found to have pneumonia and now changing therapy to ancomycin and cefepime.  Last dose of daptomycin administered 2/29 '@1900'$  prior to arrival  Plan: Vancomycin '1500mg'$  IV x1 then 1000 mg IV q24 hours (eAUC 458) Cefepime 2G IV q12 hours Monitor renal function, ID recs, CK  Height: '5\' 7"'$  (170.2 cm) Weight: 73 kg (161 lb) IBW/kg (Calculated) : 61.6  Temp (24hrs), Avg:100.6 F (38.1 C), Min:99.6 F (37.6 C), Max:101.5 F (38.6 C)  Recent Labs  Lab 03/19/22 1951 03/19/22 2219  WBC  --  14.6*  CREATININE 1.04*  --   LATICACIDVEN 1.9 1.4     Estimated Creatinine Clearance: 44.8 mL/min (A) (by C-G formula based on SCr of 1.04 mg/dL (H)).    Allergies  Allergen Reactions   Choline Fenofibrate Other (See Comments)    ELEVATED LFT(s)   Penicillins Hives   Ozempic (0.25 Or 0.5 Mg-Dose) [Semaglutide(0.25 Or 0.'5mg'$ -Dos)] Nausea Only and Other (See Comments)    SEVERE NAUSEA   Statins     Aches, pain    Georga Bora, PharmD Clinical Pharmacist 03/19/2022 11:54 PM Please check AMION for all [facility] numbers

## 2022-03-19 NOTE — Assessment & Plan Note (Signed)
Fulfills sepsis criteria. Fever of 101.5. WBC 14. Pyuria and nitrites on UA. Pneumonia on CXR.

## 2022-03-20 DIAGNOSIS — I10 Essential (primary) hypertension: Secondary | ICD-10-CM

## 2022-03-20 DIAGNOSIS — A419 Sepsis, unspecified organism: Secondary | ICD-10-CM | POA: Diagnosis not present

## 2022-03-20 DIAGNOSIS — J9601 Acute respiratory failure with hypoxia: Secondary | ICD-10-CM | POA: Diagnosis not present

## 2022-03-20 DIAGNOSIS — N135 Crossing vessel and stricture of ureter without hydronephrosis: Secondary | ICD-10-CM

## 2022-03-20 DIAGNOSIS — J189 Pneumonia, unspecified organism: Secondary | ICD-10-CM

## 2022-03-20 DIAGNOSIS — E871 Hypo-osmolality and hyponatremia: Secondary | ICD-10-CM

## 2022-03-20 DIAGNOSIS — N3000 Acute cystitis without hematuria: Secondary | ICD-10-CM | POA: Diagnosis not present

## 2022-03-20 DIAGNOSIS — G061 Intraspinal abscess and granuloma: Secondary | ICD-10-CM | POA: Diagnosis not present

## 2022-03-20 DIAGNOSIS — Z9889 Other specified postprocedural states: Secondary | ICD-10-CM

## 2022-03-20 DIAGNOSIS — N390 Urinary tract infection, site not specified: Secondary | ICD-10-CM

## 2022-03-20 DIAGNOSIS — B9689 Other specified bacterial agents as the cause of diseases classified elsewhere: Secondary | ICD-10-CM

## 2022-03-20 LAB — CBC WITH DIFFERENTIAL/PLATELET
Abs Immature Granulocytes: 0.06 10*3/uL (ref 0.00–0.07)
Abs Immature Granulocytes: 0.06 10*3/uL (ref 0.00–0.07)
Basophils Absolute: 0.1 10*3/uL (ref 0.0–0.1)
Basophils Absolute: 0.1 10*3/uL (ref 0.0–0.1)
Basophils Relative: 0 %
Basophils Relative: 0 %
Eosinophils Absolute: 0.1 10*3/uL (ref 0.0–0.5)
Eosinophils Absolute: 0.2 10*3/uL (ref 0.0–0.5)
Eosinophils Relative: 1 %
Eosinophils Relative: 1 %
HCT: 27.9 % — ABNORMAL LOW (ref 36.0–46.0)
HCT: 25.8 % — ABNORMAL LOW (ref 36.0–46.0)
Hemoglobin: 9.3 g/dL — ABNORMAL LOW (ref 12.0–15.0)
Hemoglobin: 8.5 g/dL — ABNORMAL LOW (ref 12.0–15.0)
Immature Granulocytes: 0 %
Immature Granulocytes: 1 %
Lymphocytes Relative: 5 %
Lymphocytes Relative: 7 %
Lymphs Abs: 0.7 10*3/uL (ref 0.7–4.0)
Lymphs Abs: 0.9 10*3/uL (ref 0.7–4.0)
MCH: 30.1 pg (ref 26.0–34.0)
MCH: 29.9 pg (ref 26.0–34.0)
MCHC: 33.3 g/dL (ref 30.0–36.0)
MCHC: 32.9 g/dL (ref 30.0–36.0)
MCV: 90.3 fL (ref 80.0–100.0)
MCV: 90.8 fL (ref 80.0–100.0)
Monocytes Absolute: 0.8 10*3/uL (ref 0.1–1.0)
Monocytes Absolute: 0.6 10*3/uL (ref 0.1–1.0)
Monocytes Relative: 6 %
Monocytes Relative: 5 %
Neutro Abs: 12.5 10*3/uL — ABNORMAL HIGH (ref 1.7–7.7)
Neutro Abs: 10.8 10*3/uL — ABNORMAL HIGH (ref 1.7–7.7)
Neutrophils Relative %: 88 %
Neutrophils Relative %: 86 %
Platelets: 252 10*3/uL (ref 150–400)
Platelets: 258 10*3/uL (ref 150–400)
RBC: 3.09 MIL/uL — ABNORMAL LOW (ref 3.87–5.11)
RBC: 2.84 MIL/uL — ABNORMAL LOW (ref 3.87–5.11)
RDW: 14.3 % (ref 11.5–15.5)
RDW: 14.1 % (ref 11.5–15.5)
WBC: 14.3 10*3/uL — ABNORMAL HIGH (ref 4.0–10.5)
WBC: 12.5 10*3/uL — ABNORMAL HIGH (ref 4.0–10.5)
nRBC: 0 % (ref 0.0–0.2)
nRBC: 0 % (ref 0.0–0.2)

## 2022-03-20 LAB — RESPIRATORY PANEL BY PCR

## 2022-03-20 LAB — COMPREHENSIVE METABOLIC PANEL
ALT: 21 U/L (ref 0–44)
AST: 27 U/L (ref 15–41)
Albumin: 2.3 g/dL — ABNORMAL LOW (ref 3.5–5.0)
Alkaline Phosphatase: 93 U/L (ref 38–126)
Anion gap: 10 (ref 5–15)
BUN: 17 mg/dL (ref 8–23)
CO2: 22 mmol/L (ref 22–32)
Calcium: 8 mg/dL — ABNORMAL LOW (ref 8.9–10.3)
Chloride: 99 mmol/L (ref 98–111)
Creatinine, Ser: 0.92 mg/dL (ref 0.44–1.00)
GFR, Estimated: >60 mL/min (ref >60)
Glucose, Bld: 140 mg/dL — ABNORMAL HIGH (ref 70–99)
Potassium: 3.2 mmol/L — ABNORMAL LOW (ref 3.5–5.1)
Sodium: 131 mmol/L — ABNORMAL LOW (ref 135–145)
Total Bilirubin: 1.4 mg/dL — ABNORMAL HIGH (ref 0.3–1.2)
Total Protein: 5.7 g/dL — ABNORMAL LOW (ref 6.5–8.1)

## 2022-03-20 LAB — URINALYSIS, W/ REFLEX TO CULTURE (INFECTION SUSPECTED)
Bilirubin Urine: NEGATIVE
Glucose, UA: NEGATIVE mg/dL
Ketones, ur: NEGATIVE mg/dL
Nitrite: POSITIVE — AB
Protein, ur: 30 mg/dL — AB
Specific Gravity, Urine: 1.02 (ref 1.005–1.030)
WBC, UA: >50 WBC/hpf (ref 0–5)
pH: 6 (ref 5.0–8.0)

## 2022-03-20 LAB — PREALBUMIN: Prealbumin: 11 mg/dL — ABNORMAL LOW (ref 18–38)

## 2022-03-20 LAB — GLUCOSE, CAPILLARY
Glucose-Capillary: 103 mg/dL — ABNORMAL HIGH (ref 70–99)
Glucose-Capillary: 120 mg/dL — ABNORMAL HIGH (ref 70–99)
Glucose-Capillary: 104 mg/dL — ABNORMAL HIGH (ref 70–99)

## 2022-03-20 LAB — MAGNESIUM: Magnesium: 1.8 mg/dL (ref 1.7–2.4)

## 2022-03-20 LAB — MRSA NEXT GEN BY PCR, NASAL: MRSA by PCR Next Gen: NOT DETECTED

## 2022-03-20 LAB — BRAIN NATRIURETIC PEPTIDE: B Natriuretic Peptide: 150.8 pg/mL — ABNORMAL HIGH (ref 0.0–100.0)

## 2022-03-20 MED ORDER — ORAL CARE MOUTH RINSE: 15.0000 mL | OROMUCOSAL | Status: DC | PRN

## 2022-03-20 MED ORDER — POTASSIUM CHLORIDE CRYS ER 20 MEQ PO TBCR
40.0000 meq | EXTENDED_RELEASE_TABLET | Freq: Once | ORAL | Status: AC
  Administered 2022-03-20: 40 meq via ORAL
  Filled 2022-03-20: qty 2

## 2022-03-20 MED ORDER — CHLORHEXIDINE GLUCONATE CLOTH 2 % EX PADS
6.0000 | MEDICATED_PAD | Freq: Every day | CUTANEOUS | Status: DC
  Administered 2022-03-20 – 2022-03-25 (×6): 6 via TOPICAL

## 2022-03-20 MED ORDER — ALBUTEROL SULFATE (2.5 MG/3ML) 0.083% IN NEBU
2.5000 mg | INHALATION_SOLUTION | RESPIRATORY_TRACT | Status: DC | PRN
  Administered 2022-03-21 – 2022-03-22 (×4): 2.5 mg via RESPIRATORY_TRACT
  Filled 2022-03-20 (×4): qty 3

## 2022-03-20 MED ORDER — VANCOMYCIN HCL IN DEXTROSE 1-5 GM/200ML-% IV SOLN
1000.0000 mg | INTRAVENOUS | Status: DC
  Administered 2022-03-20 – 2022-03-22 (×2): 1000 mg via INTRAVENOUS
  Filled 2022-03-20 (×2): qty 200

## 2022-03-20 MED ORDER — HEPARIN SODIUM (PORCINE) 5000 UNIT/ML IJ SOLN
5000.0000 [IU] | Freq: Three times a day (TID) | INTRAMUSCULAR | Status: DC
  Administered 2022-03-20 – 2022-03-25 (×16): 5000 [IU] via SUBCUTANEOUS
  Filled 2022-03-20 (×16): qty 1

## 2022-03-20 MED ORDER — INSULIN ASPART 100 UNIT/ML IJ SOLN
0.0000 [IU] | Freq: Three times a day (TID) | INTRAMUSCULAR | Status: DC
  Administered 2022-03-21: 2 [IU] via SUBCUTANEOUS
  Administered 2022-03-22: 3 [IU] via SUBCUTANEOUS
  Administered 2022-03-22: 2 [IU] via SUBCUTANEOUS
  Administered 2022-03-23: 5 [IU] via SUBCUTANEOUS
  Administered 2022-03-23 (×2): 8 [IU] via SUBCUTANEOUS
  Administered 2022-03-24: 2 [IU] via SUBCUTANEOUS
  Administered 2022-03-24: 8 [IU] via SUBCUTANEOUS
  Administered 2022-03-25: 3 [IU] via SUBCUTANEOUS

## 2022-03-20 MED ORDER — ONDANSETRON HCL 4 MG/2ML IJ SOLN: 4.0000 mg | Freq: Four times a day (QID) | INTRAMUSCULAR | Status: DC | PRN

## 2022-03-20 MED ORDER — SODIUM CHLORIDE 0.9 % IV SOLN
2.0000 g | Freq: Two times a day (BID) | INTRAVENOUS | Status: DC
  Administered 2022-03-20 – 2022-03-22 (×5): 2 g via INTRAVENOUS
  Filled 2022-03-20 (×5): qty 12.5

## 2022-03-20 MED ORDER — ONDANSETRON HCL 4 MG PO TABS: 4.0000 mg | ORAL_TABLET | Freq: Four times a day (QID) | ORAL | Status: DC | PRN

## 2022-03-20 NOTE — Progress Notes (Signed)
PROGRESS NOTE    Cyndee V Martino  [id number] DOB: [birth year] DOA: 03/19/2022 PCP: Katherina Mires, MD   Brief Narrative: Melissa Leon is a 77 y.o. female with a history diabetes mellitus and hypertension. Patient recently diagnosed with epidural abscess currently on daptomycin. Patient presented secondary to fever and weakness and found to have evidence of pneumonia with associated respiratory failure and UTI. Patient started on supplemental oxygen and empiric antibiotics. Cultures obtained and pending.   Assessment and Plan:  Community acquired pneumonia Complicated by history of prior spinal epidural abscess, on daptomycin prior to admission. Patient started empirically on Vancomycin, azithromycin and Cefepime. -Continue Vancomycin, azithromycin and Cefepime  Acute respiratory failure with hypoxia Secondary to above. Patient requiring as much as 10 L/min of oxygen with associated dyspnea. -Wean to room air as able -Ambulatory pulse ox prior to discharge  Acute cystitis without hematuria Patient with frequency. No dysuria. Urinalysis suggests possible UTI. Complicated by present of ureteral stent.  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 Patient follows with neurosurgery and ID and is s/p PICC on Daptomycin. Daptomycin transitioned to Vancomycin this admission for coverage of pneumonia.  Sepsis Present on admission.  History of right ureteral obstruction Patient is s/p right ureteral stent after recent right nephrostomy tube removal. CT abdomen significant for dilation of right pelvis. Discussed with Dr. Alinda Money who recommends continued treatment of UTI and to re-consult if patient develops associated AKI.  Primary hypertension Noted.   Diabetes mellitus type 2 Controlled. Hemoglobin A1C of 5.6%. -SSI  Hyponatremia Mild.   DVT prophylaxis: Heparin subq Code Status:   Code Status: Full Code Family Communication: Daughter at bedside Disposition  Plan: Discharge home likely in 2-4 days pending continued infection management and specialist recommendations   Consultants:  PCCM Infectious disease  Procedures:  None  Antimicrobials: Vancomycin Cefepime Azithromycin    Subjective: Patient reports feeling much better than last night. No dyspnea this morning. Cough without production.  Objective: BP (!) 112/59 (BP Location: Left Arm)   Pulse 80   Temp 98.3 F (36.8 C) (Oral)   Resp 18   Ht '5\' 7"'$  (1.702 m)   Wt 73 kg   SpO2 100%   BMI 25.22 kg/m   Examination:  General exam: Appears calm and comfortable Respiratory system: Respiratory effort normal. Cardiovascular system: S1 & S2 heard, RRR. Gastrointestinal system: Abdomen is nondistended, soft and nontender. Normal bowel sounds heard. Central nervous system: Alert and oriented. No focal neurological deficits. Musculoskeletal: No edema. No calf tenderness Skin: No cyanosis. No rashes Psychiatry: Judgement and insight appear normal. Mood & affect appropriate.    Data Reviewed: I have personally reviewed following labs and imaging studies  CBC Lab Results  Component Value Date   WBC 12.5 (H) 03/20/2022   RBC 2.84 (L) 03/20/2022   HGB 8.5 (L) 03/20/2022   HCT 25.8 (L) 03/20/2022   MCV 90.8 03/20/2022   MCH 29.9 03/20/2022   PLT 258 03/20/2022   MCHC 32.9 03/20/2022   RDW 14.1 03/20/2022   LYMPHSABS 0.9 03/20/2022   MONOABS 0.6 03/20/2022   EOSABS 0.2 03/20/2022   BASOSABS 0.1 [id number]     Last metabolic panel Lab Results  Component Value Date   NA 131 (L) 03/20/2022   K 3.2 (L) 03/20/2022   CL 99 03/20/2022   CO2 22 03/20/2022   BUN 17 03/20/2022   CREATININE 0.92 03/20/2022   GLUCOSE 140 (H) 03/20/2022   GFRNONAA >60 03/20/2022   CALCIUM 8.0 (L) 03/20/2022  PHOS 3.8 03/06/2022   PROT 5.7 (L) 03/20/2022   ALBUMIN 2.3 (L) 03/20/2022   BILITOT 1.4 (H) 03/20/2022   ALKPHOS 93 03/20/2022   AST 27 03/20/2022   ALT 21 03/20/2022   ANIONGAP  10 03/20/2022    GFR: Estimated Creatinine Clearance: 50.6 mL/min (by C-G formula based on SCr of 0.92 mg/dL).  Recent Results (from the past 240 hour(s))  Blood culture (routine x 2)     Status: None (Preliminary result)   Collection Time: 03/19/22  7:35 PM   Specimen: BLOOD RIGHT HAND  Result Value Ref Range Status   Specimen Description BLOOD RIGHT HAND  Final   Special Requests   Final    BOTTLES DRAWN AEROBIC ONLY Blood Culture adequate volume   Culture   Final    NO GROWTH < 12 HOURS Performed at [facility], [address] [place], [place] [address]    Report Status PENDING  Incomplete  Blood culture (routine x 2)     Status: None (Preliminary result)   Collection Time: 03/19/22  7:48 PM   Specimen: BLOOD RIGHT ARM  Result Value Ref Range Status   Specimen Description BLOOD RIGHT ARM  Final   Special Requests   Final    BOTTLES DRAWN AEROBIC AND ANAEROBIC Blood Culture adequate volume   Culture   Final    NO GROWTH < 12 HOURS Performed at [facility], [place] [address], [place], [place] [address]    Report Status PENDING  Incomplete  Resp panel by RT-PCR (RSV, Flu A&B, Covid) Anterior Nasal Swab     Status: None   Collection Time: 03/19/22 10:25 PM   Specimen: Anterior Nasal Swab  Result Value Ref Range Status   SARS Coronavirus 2 by RT PCR NEGATIVE NEGATIVE Final   Influenza A by PCR NEGATIVE NEGATIVE Final   Influenza B by PCR NEGATIVE NEGATIVE Final    Comment: (NOTE) The Xpert Xpress SARS-CoV-2/FLU/RSV plus assay is intended as an aid in the diagnosis of influenza from Nasopharyngeal swab specimens and should not be used as a sole basis for treatment. Nasal washings and aspirates are unacceptable for Xpert Xpress SARS-CoV-2/FLU/RSV testing.  Fact Sheet for Patients: EntrepreneurPulse.com.au  Fact Sheet for Healthcare Providers: IncredibleEmployment.be  This test is not yet approved or cleared by the [place] FDA and has been authorized for detection and/or diagnosis of SARS-CoV-2 by FDA under an Emergency Use Authorization (EUA). This EUA will remain in effect (meaning this test can be used) for the duration of the COVID-19 declaration under Section 564(b)(1) of the Act, 21 U.S.C. section 360bbb-3(b)(1), unless the authorization is terminated or revoked.     Resp Syncytial Virus by PCR NEGATIVE NEGATIVE Final    Comment: (NOTE) Fact Sheet for Patients: EntrepreneurPulse.com.au  Fact Sheet for Healthcare Providers: IncredibleEmployment.be  This test is not yet approved or cleared by the [place] FDA and has been authorized for detection and/or diagnosis of SARS-CoV-2 by FDA under an Emergency Use Authorization (EUA). This EUA will remain in effect (meaning this test can be used) for the duration of the COVID-19 declaration under Section 564(b)(1) of the Act, 21 U.S.C. section 360bbb-3(b)(1), unless the authorization is terminated or revoked.  Performed at [facility], [place] [address] [place],  [address]       Radiology Studies: DG Chest 1 View  Result Date: 03/19/2022 CLINICAL DATA:  Weakness EXAM: CHEST  1 VIEW COMPARISON:  Chest x-ray 12/10/2018 FINDINGS: There is  a moderate amount of patchy airspace consolidation in the left mid and upper lung. There is also faint patchy airspace disease in the right lung base. There is no pleural effusion or pneumothorax. Cardiomediastinal silhouette is within normal limits. Right-sided central venous catheter tip projects over the SVC. No acute fractures are seen. IMPRESSION: Bilateral airspace disease, left greater than right. Findings are concerning for multifocal pneumonia. Follow-up PA and lateral chest x-ray recommended in 4-6 weeks to confirm resolution. Electronically Signed   By: Ronney Asters M.D.   On: 03/19/2022 20:41   CT ABDOMEN PELVIS WO CONTRAST  Result Date:  03/19/2022 CLINICAL DATA:  Recent procedures of nephrostomy tube insertion and removal and removal of spinal abscess. Presenting with fever and weakness. Currently receiving daptomycin for infection. EXAM: CT ABDOMEN AND PELVIS WITHOUT CONTRAST TECHNIQUE: Multidetector CT imaging of the abdomen and pelvis was performed following the standard protocol without IV contrast. RADIATION DOSE REDUCTION: This exam was performed according to the departmental dose-optimization program which includes automated exposure control, adjustment of the mA and/or kV according to patient size and/or use of iterative reconstruction technique. COMPARISON:  CT 02/25/2022 FINDINGS: Lower chest: Patchy airspace opacities in the bilateral lower lung suspicious for pneumonia. Hepatobiliary: Unremarkable liver and biliary tree. Cholecystectomy. Pancreas: Unremarkable. Spleen: Unremarkable. Adrenals/Urinary Tract: Normal adrenal glands. Ureteral stent in the right ureter. Interval removal of the right nephrostomy tube with new dilation of the right renal pelvis. No urinary calculi. Unremarkable bladder. Stomach/Bowel: Normal caliber large and small bowel. Colonic diverticulosis without diverticulitis. Moderate colonic stool load. The appendix is not visualized. Vascular/Lymphatic: Aortic atherosclerotic calcification. No lymphadenopathy. Reproductive: Unremarkable uterus and ovaries. Other: No free intraperitoneal fluid or air. Musculoskeletal: No acute osseous abnormality. IMPRESSION: 1. Patchy airspace opacities in the bilateral lower lung suspicious for pneumonia. 2. Interval removal of the right nephrostomy tube with new development of dilation of the right pelvis. Recommend correlation for proper functioning of the right ureteral stent. Aortic Atherosclerosis (ICD10-I70.0). Electronically Signed   By: Placido Sou M.D.   On: 03/19/2022 20:27      LOS: 1 day    Cordelia Poche, MD [facility] 03/20/2022, 8:15 AM   If  7PM-7AM, please contact night-coverage [email or web address]

## 2022-03-20 NOTE — TOC Initial Note (Signed)
Transition of Care [facility]) - Initial/Assessment Note    Patient Details  Name: Melissa Leon MRN: [id number] Date of Birth: 10-18-1945  Transition of [facility]) CM/SW Contact:    Pollie Friar, RN Phone Number: 03/20/2022, 3:59 PM  Clinical Narrative:                 Pt is from home with home health services through [facility] for IV abx. [facility] was providing home IV abx and are following.  TOC following to resume [place] as needed and for other d/c needs.   Expected Discharge Plan: [place] Barriers to Discharge: Continued Medical Work up   Patient Goals and CMS Choice            Expected Discharge Plan and Services   Discharge Planning Services: CM Consult Post Acute Care Choice: [place] arrangements for the past 2 months: [place]                                      Prior Living Arrangements/Services Living arrangements for the past 2 months: Single Family Home Lives with:: Self                   Activities of Daily Living Home Assistive Devices/Equipment: Cane (specify quad or straight), Wheelchair, Environmental consultant (specify type) ADL Screening (condition at time of admission) Patient's cognitive ability adequate to safely complete daily activities?: Yes Is the patient deaf or have difficulty hearing?: Yes Does the patient have difficulty seeing, even when wearing glasses/contacts?: No Does the patient have difficulty concentrating, remembering, or making decisions?: No Patient able to express need for assistance with ADLs?: Yes Does the patient have difficulty dressing or bathing?: No Independently performs ADLs?: Yes (appropriate for developmental age) Does the patient have difficulty walking or climbing stairs?: Yes Weakness of Legs: Both Weakness of Arms/Hands: Both  Permission Sought/Granted                  Emotional Assessment              Admission diagnosis:  Acute cystitis without hematuria  [N30.00] Urinary tract infection without hematuria, site unspecified [N39.0] Pneumonia due to infectious organism, unspecified laterality, unspecified part of lung [J18.9] Sepsis without acute organ dysfunction, due to unspecified organism [facility]) [A41.9] Patient Active Problem List   Diagnosis Date Noted   Urinary tract infection without hematuria 03/20/2022   Acute cystitis without hematuria 03/19/2022   Pneumonia due to infectious organism 03/19/2022   Acute respiratory failure with hypoxia ([place]) 03/19/2022   Bacterial spinal epidural abscess - MRSA 03/19/2022   S/P lumbar laminectomy 03/01/2022   Ureteral obstruction, right 02/28/2022   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 [place] [address] Phone: [phone] Fax: [phone]     Social Determinants of Health (SDOH) Social History: SDOH Screenings   Food Insecurity: No Food Insecurity (03/20/2022)  Housing: Low Risk  (03/20/2022)  Transportation Needs: No Transportation  Needs (03/20/2022)  Utilities: Not At Risk (03/20/2022)  Tobacco Use: Medium Risk (03/19/2022)   SDOH Interventions:     Readmission Risk Interventions    01/06/2022    9:26 AM  Readmission Risk Prevention Plan  Transportation Screening Complete  PCP or Specialist Appt within 5-7 Days Complete  Home Care Screening Complete  Medication Review (RN CM) Complete

## 2022-03-20 NOTE — Progress Notes (Signed)
Patient with labs ordered, have a Picc line. No blood return noted. IV team was paged and asked for help.

## 2022-03-20 NOTE — ED Notes (Signed)
ED TO INPATIENT HANDOFF REPORT  ED Nurse Name and Phone #: Ed Blalock Name/Age/Gender Melissa Leon 76 y.o. female Room/Bed: 033C/033C  Code Status   Code Status: Full Code  Home/SNF/Other Home Patient oriented to: self, place, time, and situation Is this baseline? Yes   Triage Complete: Triage complete  Chief Complaint Acute cystitis without hematuria [N30.00]  Triage Note Pt c/o increased weakness and fever of 102 today. Recent nephrostomy tube removed on 2/9 and recent spinal abscess operated on. PICC line in place receiving antibiotics for MRSA infection.       Allergies Allergies  Allergen Reactions   Choline Fenofibrate Other (See Comments)    ELEVATED LFT(s)   Penicillins Hives   Ozempic (0.25 Or 0.5 Mg-Dose) [Semaglutide(0.25 Or 0.'5mg'$ -Dos)] Nausea Only and Other (See Comments)    SEVERE NAUSEA   Statins     Aches, pain    Level of Care/Admitting Diagnosis ED Disposition     ED Disposition  Admit   Condition  --   [place]: [place] [[id number]]  Level of Care: Progressive [102]  Admit to Progressive based on following criteria: RESPIRATORY PROBLEMS hypoxemic/hypercapnic respiratory failure that is responsive to NIPPV (BiPAP) or High Flow Nasal Cannula (6-80 lpm). Frequent assessment/intervention, no > Q2 hrs < Q4 hrs, to maintain oxygenation and pulmonary hygiene.  Admit to Progressive based on following criteria: COMPLICATED UROLOGY Patients requiring frequent assessments and interventions, such as continuous bladder irrigations, immediate post-op surgical procedures, i.e., bladder removal/ileal conduit.  May admit patient to [facility] or [facility] if equivalent level of care is available:: No  Covid Evaluation: Asymptomatic - no recent exposure (last 10 days) testing not required  Diagnosis: Acute cystitis without hematuria [id number]  Admitting Physician: Bridgett Larsson, [place]  Attending Physician: Bridgett Larsson, ERIC [id number]   Certification:: I certify this patient will need inpatient services for at least 2 midnights  Estimated Length of Stay: 4          B Medical/Surgery History Past Medical History:  Diagnosis Date   Anemia    Arthritis    Depression    Diabetes mellitus ([place])    Hypertension    Pneumonia    Spinal stenosis    Past Surgical History:  Procedure Laterality Date   APPENDECTOMY     CHOLECYSTECTOMY     CYSTOSCOPY WITH RETROGRADE PYELOGRAM, URETEROSCOPY AND STENT PLACEMENT Right 02/27/2022   Procedure: TURBT, CYSTOSCOPY WITH RIGHT RETROGRADE PYELOGRAM, RIGHT URETEROSCOPY, BIOPSY AND STENT PLACEMENT;  Surgeon: Janith Lima, MD;  Location: [facility];  Service: Urology;  Laterality: Right;  30 MINUTES NEEDED FOR CASE   IR NEPHROSTOMY PLACEMENT RIGHT  01/05/2022   LUMBAR LAMINECTOMY FOR EPIDURAL ABSCESS N/A 03/01/2022   Procedure: LUMBAR LAMINECTOMY LUMBAR THREE-FOUR, LUMBAR FOUR- FIVE FOR EPIDURAL ABSCESS;  Surgeon: Eustace Moore, MD;  Location: [facility];  Service: Neurosurgery;  Laterality: N/A;   UTERINE FIBROID SURGERY     WRIST GANGLION EXCISION Right      A IV Location/Drains/Wounds Patient Lines/Drains/Airways Status     Active Line/Drains/Airways     Name Placement date Placement time Site Days   PICC Single Lumen 03/04/22 Right Brachial 39 cm 0 cm 03/04/22  0921  Brachial  16   Urethral Catheter Linden Mikes RN Non-latex 14 Fr. 03/20/22  0020  Non-latex  less than 1   Ureteral Drain/Stent Right ureter 6 Fr. 02/27/22  1225  Right ureter  21   Wound / Incision (Open or Dehisced)  02/28/22 Non-pressure wound;Other (Comment) Hip Right;Lateral closed 02/28/22  0510  Hip  20            Intake/Output Last 24 hours  Intake/Output Summary (Last 24 hours) at 03/20/2022 0129 Last data filed at 03/19/2022 2328 Gross per 24 hour  Intake 1099 ml  Output --  Net 1099 ml    Labs/Imaging Results for orders placed or performed during the hospital encounter of 03/19/22 (from the past 48  hour(s))  Urinalysis, Routine w reflex microscopic -Urine, Clean Catch     Status: Abnormal   Collection Time: 03/19/22  7:33 PM  Result Value Ref Range   Color, Urine YELLOW YELLOW   APPearance CLOUDY (A) CLEAR   Specific Gravity, Urine 1.020 1.005 - 1.030   pH 6.5 5.0 - 8.0   Glucose, UA NEGATIVE NEGATIVE mg/dL   Hgb urine dipstick SMALL (A) NEGATIVE   Bilirubin Urine NEGATIVE NEGATIVE   Ketones, ur NEGATIVE NEGATIVE mg/dL   Protein, ur 30 (A) NEGATIVE mg/dL   Nitrite POSITIVE (A) NEGATIVE   Leukocytes,Ua MODERATE (A) NEGATIVE    Comment: Performed at [facility], [address], [place], [place] [address]  Urinalysis, Microscopic (reflex)     Status: Abnormal   Collection Time: 03/19/22  7:33 PM  Result Value Ref Range   RBC / HPF 11-20 0 - 5 RBC/hpf   WBC, UA >50 0 - 5 WBC/hpf   Bacteria, UA MANY (A) NONE SEEN   Squamous Epithelial / HPF 0-5 0 - 5 /HPF   Mucus PRESENT    Budding Yeast PRESENT     Comment: Performed at [facility], [place] [address] [place], [place] [address]  Blood culture (routine x 2)     Status: None (Preliminary result)   Collection Time: 03/19/22  7:48 PM   Specimen: BLOOD RIGHT ARM  Result Value Ref Range   Specimen Description BLOOD RIGHT ARM    Special Requests      BOTTLES DRAWN AEROBIC AND ANAEROBIC Blood Culture adequate volume Performed at [facility], [place] [address], [place],  [address]    Culture PENDING    Report Status PENDING   Comprehensive metabolic panel     Status: Abnormal   Collection Time: 03/19/22  7:51 PM  Result Value Ref Range   Sodium 130 (L) 135 - 145 mmol/L   Potassium 3.7 3.5 - 5.1 mmol/L   Chloride 95 (L) 98 - 111 mmol/L   CO2 24 22 - 32 mmol/L   Glucose, Bld 100 (H) 70 - 99 mg/dL    Comment: Glucose reference range applies only to samples taken after fasting for at least 8 hours.   BUN 21 8 - 23 mg/dL   Creatinine, Ser 1.04 (H) 0.44 - 1.00 mg/dL   Calcium 8.6 (L) 8.9 - 10.3 mg/dL   Total  Protein 6.9 6.5 - 8.1 g/dL   Albumin 3.0 (L) 3.5 - 5.0 g/dL   AST 30 15 - 41 U/L   ALT 25 0 - 44 U/L   Alkaline Phosphatase 113 38 - 126 U/L   Total Bilirubin 1.2 0.3 - 1.2 mg/dL   GFR, Estimated 56 (L) >60 mL/min    Comment: (NOTE) Calculated using the CKD-EPI Creatinine Equation (2021)    Anion gap 11 5 - 15    Comment: Performed at [facility] [address] [place], [place] [address]  Lactic acid, plasma     Status: None   Collection  Time: 03/19/22  7:51 PM  Result Value Ref Range   Lactic Acid, Venous 1.9 0.5 - 1.9 mmol/L    Comment: Performed at [facility], [place] [address] [place], [place] [address]  Lactic acid, plasma     Status: None   Collection Time: 03/19/22 10:19 PM  Result Value Ref Range   Lactic Acid, Venous 1.4 0.5 - 1.9 mmol/L    Comment: Performed at [facility] [address], [place], [place] [address]  CBC with Differential/Platelet     Status: Abnormal   Collection Time: 03/19/22 10:19 PM  Result Value Ref Range   WBC 14.6 (H) 4.0 - 10.5 K/uL   RBC 3.22 (L) 3.87 - 5.11 MIL/uL   Hemoglobin 9.6 (L) 12.0 - 15.0 g/dL   HCT 29.2 (L) 36.0 - 46.0 %   MCV 90.7 80.0 - 100.0 fL   MCH 29.8 26.0 - 34.0 pg   MCHC 32.9 30.0 - 36.0 g/dL   RDW 14.3 11.5 - 15.5 %   Platelets 288 150 - 400 K/uL   nRBC 0.0 0.0 - 0.2 %   Neutrophils Relative % 80 %   Neutro Abs 11.8 (H) 1.7 - 7.7 K/uL   Lymphocytes Relative 8 %   Lymphs Abs 1.2 0.7 - 4.0 K/uL   Monocytes Relative 8 %   Monocytes Absolute 1.2 (H) 0.1 - 1.0 K/uL   Eosinophils Relative 3 %   Eosinophils Absolute 0.4 0.0 - 0.5 K/uL   Basophils Relative 0 %   Basophils Absolute 0.1 0.0 - 0.1 K/uL   Immature Granulocytes 1 %   Abs Immature Granulocytes 0.07 0.00 - 0.07 K/uL    Comment: Performed at [facility], [place] [address], [place], [place] [address]  Resp panel by RT-PCR (RSV, Flu A&B, Covid) Anterior Nasal Swab     Status: None   Collection Time: 03/19/22 10:25 PM   Specimen:  Anterior Nasal Swab  Result Value Ref Range   SARS Coronavirus 2 by RT PCR NEGATIVE NEGATIVE   Influenza A by PCR NEGATIVE NEGATIVE   Influenza B by PCR NEGATIVE NEGATIVE    Comment: (NOTE) The Xpert Xpress SARS-CoV-2/FLU/RSV plus assay is intended as an aid in the diagnosis of influenza from Nasopharyngeal swab specimens and should not be used as a sole basis for treatment. Nasal washings and aspirates are unacceptable for Xpert Xpress SARS-CoV-2/FLU/RSV testing.  Fact Sheet for Patients: EntrepreneurPulse.com.au  Fact Sheet for Healthcare Providers: IncredibleEmployment.be  This test is not yet approved or cleared by the [place] FDA and has been authorized for detection and/or diagnosis of SARS-CoV-2 by FDA under an Emergency Use Authorization (EUA). This EUA will remain in effect (meaning this test can be used) for the duration of the COVID-19 declaration under Section 564(b)(1) of the Act, 21 U.S.C. section 360bbb-3(b)(1), unless the authorization is terminated or revoked.     Resp Syncytial Virus by PCR NEGATIVE NEGATIVE    Comment: (NOTE) Fact Sheet for Patients: EntrepreneurPulse.com.au  Fact Sheet for Healthcare Providers: IncredibleEmployment.be  This test is not yet approved or cleared by the [place] FDA and has been authorized for detection and/or diagnosis of SARS-CoV-2 by FDA under an Emergency Use Authorization (EUA). This EUA will remain in effect (meaning this test can be used) for the duration of the COVID-19 declaration under Section 564(b)(1) of the Act, 21 U.S.C. section 360bbb-3(b)(1), unless the authorization is terminated or revoked.  Performed at [facility], [place]  [address], [place], [place] [address]   Urinalysis, w/ Reflex to Culture (Infection Suspected) -Urine, Catheterized     Status: Abnormal   Collection Time: 03/20/22 12:45 AM  Result Value Ref Range    Specimen Source URINE, CATHETERIZED    Color, Urine YELLOW YELLOW   APPearance CLEAR CLEAR   Specific Gravity, Urine 1.020 1.005 - 1.030   pH 6.0 5.0 - 8.0   Glucose, UA NEGATIVE NEGATIVE mg/dL   Hgb urine dipstick SMALL (A) NEGATIVE   Bilirubin Urine NEGATIVE NEGATIVE   Ketones, ur NEGATIVE NEGATIVE mg/dL   Protein, ur 30 (A) NEGATIVE mg/dL   Nitrite POSITIVE (A) NEGATIVE   Leukocytes,Ua MODERATE (A) NEGATIVE   Squamous Epithelial / HPF 0-5 0 - 5 /HPF   WBC, UA >50 0 - 5 WBC/hpf    Comment: Reflex urine culture not performed if WBC <=10, OR if Squamous epithelial cells >5. If Squamous epithelial cells >5, suggest recollection.   RBC / HPF 11-20 0 - 5 RBC/hpf   Bacteria, UA FEW (A) NONE SEEN   Mucus PRESENT    Budding Yeast PRESENT     Comment: Performed at [facility] [address] [place], [place] [address]   DG Chest 1 View  Result Date: 03/19/2022 CLINICAL DATA:  Weakness EXAM: CHEST  1 VIEW COMPARISON:  Chest x-ray 12/10/2018 FINDINGS: There is a moderate amount of patchy airspace consolidation in the left mid and upper lung. There is also faint patchy airspace disease in the right lung base. There is no pleural effusion or pneumothorax. Cardiomediastinal silhouette is within normal limits. Right-sided central venous catheter tip projects over the SVC. No acute fractures are seen. IMPRESSION: Bilateral airspace disease, left greater than right. Findings are concerning for multifocal pneumonia. Follow-up PA and lateral chest x-ray recommended in 4-6 weeks to confirm resolution. Electronically Signed   By: Ronney Asters M.D.   On: 03/19/2022 20:41   CT ABDOMEN PELVIS WO CONTRAST  Result Date: 03/19/2022 CLINICAL DATA:  Recent procedures of nephrostomy tube insertion and removal and removal of spinal abscess. Presenting with fever and weakness. Currently receiving daptomycin for infection. EXAM: CT ABDOMEN AND PELVIS WITHOUT CONTRAST TECHNIQUE: Multidetector CT imaging of  the abdomen and pelvis was performed following the standard protocol without IV contrast. RADIATION DOSE REDUCTION: This exam was performed according to the departmental dose-optimization program which includes automated exposure control, adjustment of the mA and/or kV according to patient size and/or use of iterative reconstruction technique. COMPARISON:  CT 02/25/2022 FINDINGS: Lower chest: Patchy airspace opacities in the bilateral lower lung suspicious for pneumonia. Hepatobiliary: Unremarkable liver and biliary tree. Cholecystectomy. Pancreas: Unremarkable. Spleen: Unremarkable. Adrenals/Urinary Tract: Normal adrenal glands. Ureteral stent in the right ureter. Interval removal of the right nephrostomy tube with new dilation of the right renal pelvis. No urinary calculi. Unremarkable bladder. Stomach/Bowel: Normal caliber large and small bowel. Colonic diverticulosis without diverticulitis. Moderate colonic stool load. The appendix is not visualized. Vascular/Lymphatic: Aortic atherosclerotic calcification. No lymphadenopathy. Reproductive: Unremarkable uterus and ovaries. Other: No free intraperitoneal fluid or air. Musculoskeletal: No acute osseous abnormality. IMPRESSION: 1. Patchy airspace opacities in the bilateral lower lung suspicious for pneumonia. 2. Interval removal of the right nephrostomy tube with new development of dilation of the right pelvis. Recommend correlation for proper functioning of the right ureteral stent. Aortic Atherosclerosis (ICD10-I70.0). Electronically Signed   By: Placido Sou M.D.   On: 03/19/2022 20:27    Pending Labs Unresulted Labs (From admission, onward)     Start  Ordered   03/20/22 0045  Urine Culture  Once,   R        03/20/22 0045   03/19/22 2336  Prealbumin  Add-on,   AD        03/19/22 2335   03/19/22 2324  Brain natriuretic peptide  Add-on,   AD        03/19/22 2323   03/19/22 2208  CBC with Differential  Once,   STAT        03/19/22 2207    03/19/22 1921  Blood culture (routine x 2)  BLOOD CULTURE X 2,   R     Question Answer Comment  Patient immune status Normal   Release to patient Immediate      03/19/22 1920   03/19/22 1920  CBC with Differential  Once,   STAT        03/19/22 1920   Signed and Held  Comprehensive metabolic panel  Tomorrow morning,   R        Signed and Held   Signed and Held  CBC with Differential/Platelet  Tomorrow morning,   R        Signed and Held   Signed and Held  Magnesium  Tomorrow morning,   R        Signed and Held            Vitals/Pain Today's Vitals   03/19/22 2300 03/19/22 2356 03/20/22 0000 03/20/22 0045  BP: (!) 147/72  (!) 157/78 123/68  Pulse: (!) 107 (!) 112 (!) 113 (!) 105  Resp: (!) 27 (!) 29 (!) 26 (!) 21  Temp:      TempSrc:      SpO2: 90% (!) 89% 93% 100%  Weight:      Height:      PainSc:        Isolation Precautions Airborne and Contact precautions  Medications Medications  acetaminophen (TYLENOL) tablet 650 mg (has no administration in time range)  azithromycin (ZITHROMAX) tablet 500 mg (has no administration in time range)  ceFEPIme (MAXIPIME) 2 g in sodium chloride 0.9 % 100 mL IVPB (has no administration in time range)  vancomycin (VANCOREADY) IVPB 1500 mg/300 mL (1,500 mg Intravenous New Bag/Given 03/20/22 0040)  vancomycin (VANCOCIN) IVPB 1000 mg/200 mL premix (has no administration in time range)  ceFEPIme (MAXIPIME) 2 g in sodium chloride 0.9 % 100 mL IVPB (0 g Intravenous Stopped 03/19/22 2247)  sodium chloride 0.9 % bolus 1,000 mL (0 mLs Intravenous Stopped 03/19/22 2328)  acetaminophen (TYLENOL) tablet 1,000 mg (1,000 mg Oral Given 03/20/22 0003)    Mobility non-ambulatory     Focused Assessments Cardiac Assessment Handoff:  Cardiac Rhythm: Sinus tachycardia Lab Results  Component Value Date   CKTOTAL 40 03/04/2022   No results found for: "DDIMER" Does the Patient currently have chest pain? No   , Neuro Assessment Handoff:  Swallow  screen pass? Yes  Cardiac Rhythm: Sinus tachycardia       Neuro Assessment: Within Defined Limits Neuro Checks:      Has TPA been given? No If patient is a Neuro Trauma and patient is going to OR before floor call report to 4N Charge nurse: [phone] or [phone]  , Pulmonary Assessment Handoff:  Lung sounds: Bilateral Breath Sounds: Coarse crackles O2 Device: Nasal Cannula O2 Flow Rate (L/min): 4 L/min    R Recommendations: See Admitting Provider Note  Report given to:   Additional Notes:

## 2022-03-20 NOTE — Progress Notes (Signed)
   03/20/22 1400  Spiritual Encounters  Type of Visit Initial  Care provided to: Patient  Referral source Patient request  Reason for visit Routine spiritual support  OnCall Visit No  Spiritual Framework  Presenting Themes Meaning/purpose/sources of inspiration  Values/beliefs faith  Community/Connection Family  Strengths resilience  Patient Stress Factors Health changes  Goals  Self/Personal Goals get healthy so that she can be there for her children  Interventions  Spiritual Care Interventions Made Compassionate presence;Reflective listening;Normalization of emotions  Intervention Outcomes  Outcomes Awareness around self/spiritual resourses   Ch responded to request for emotional and spiritual care. There was no family at bedside. Pt is going through spiritual distress because of her health. She is tired of coming to the hospital. 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 Ch asked guided questions around faith and value and encourage of sharing of feelings. No follow-up needed at this time.

## 2022-03-20 NOTE — ED Notes (Signed)
ED TO INPATIENT HANDOFF REPORT  ED Nurse Name and Phone #: Gennaro Africa RN  S Name/Age/Gender Melissa Leon 77 y.o. female Room/Bed: 033C/033C  Code Status   Code Status: Full Code  Home/SNF/Other Home Patient oriented to: self, place, time, and situation Is this baseline? Yes   Triage Complete: Triage complete  Chief Complaint Acute cystitis without hematuria [N30.00]  Triage Note Pt c/o increased weakness and fever of 102 today. Recent nephrostomy tube removed on 2/9 and recent spinal abscess operated on. PICC line in place receiving antibiotics for MRSA infection.       Allergies Allergies  Allergen Reactions   Choline Fenofibrate Other (See Comments)    ELEVATED LFT(s)   Penicillins Hives   Ozempic (0.25 Or 0.5 Mg-Dose) [Semaglutide(0.25 Or 0.'5mg'$ -Dos)] Nausea Only and Other (See Comments)    SEVERE NAUSEA   Statins     Aches, pain    Level of Care/Admitting Diagnosis ED Disposition     ED Disposition  Admit   Condition  --   [place]: [place] [100100]  Level of Care: Progressive [102]  Admit to Progressive based on following criteria: RESPIRATORY PROBLEMS hypoxemic/hypercapnic respiratory failure that is responsive to NIPPV (BiPAP) or High Flow Nasal Cannula (6-80 lpm). Frequent assessment/intervention, no > Q2 hrs < Q4 hrs, to maintain oxygenation and pulmonary hygiene.  Admit to Progressive based on following criteria: COMPLICATED UROLOGY Patients requiring frequent assessments and interventions, such as continuous bladder irrigations, immediate post-op surgical procedures, i.e., bladder removal/ileal conduit.  May admit patient to [facility] or [facility] if equivalent level of care is available:: No  Covid Evaluation: Asymptomatic - no recent exposure (last 10 days) testing not required  Diagnosis: Acute cystitis without hematuria [id number]  Admitting Physician: Bridgett Larsson, [place]  Attending Physician: Bridgett Larsson, ERIC  [id number]  Certification:: I certify this patient will need inpatient services for at least 2 midnights  Estimated Length of Stay: 4          B Medical/Surgery History Past Medical History:  Diagnosis Date   Anemia    Arthritis    Depression    Diabetes mellitus ([place])    Hypertension    Pneumonia    Spinal stenosis    Past Surgical History:  Procedure Laterality Date   APPENDECTOMY     CHOLECYSTECTOMY     CYSTOSCOPY WITH RETROGRADE PYELOGRAM, URETEROSCOPY AND STENT PLACEMENT Right 02/27/2022   Procedure: TURBT, CYSTOSCOPY WITH RIGHT RETROGRADE PYELOGRAM, RIGHT URETEROSCOPY, BIOPSY AND STENT PLACEMENT;  Surgeon: Janith Lima, MD;  Location: [facility];  Service: Urology;  Laterality: Right;  30 MINUTES NEEDED FOR CASE   IR NEPHROSTOMY PLACEMENT RIGHT  01/05/2022   LUMBAR LAMINECTOMY FOR EPIDURAL ABSCESS N/A 03/01/2022   Procedure: LUMBAR LAMINECTOMY LUMBAR THREE-FOUR, LUMBAR FOUR- FIVE FOR EPIDURAL ABSCESS;  Surgeon: Eustace Moore, MD;  Location: [place];  Service: Neurosurgery;  Laterality: N/A;   UTERINE FIBROID SURGERY     WRIST GANGLION EXCISION Right      A IV Location/Drains/Wounds Patient Lines/Drains/Airways Status     Active Line/Drains/Airways     Name Placement date Placement time Site Days   PICC Single Lumen 03/04/22 Right Brachial 39 cm 0 cm 03/04/22  0921  Brachial  16   Ureteral Drain/Stent Right ureter 6 Fr. 02/27/22  1225  Right ureter  21   Wound / Incision (Open or Dehisced) 02/28/22 Non-pressure wound;Other (Comment) Hip Right;Lateral closed 02/28/22  0510  Hip  20  Intake/Output Last 24 hours  Intake/Output Summary (Last 24 hours) at 03/20/2022 0031 Last data filed at 03/19/2022 2328 Gross per 24 hour  Intake 1099 ml  Output --  Net 1099 ml    Labs/Imaging Results for orders placed or performed during the hospital encounter of 03/19/22 (from the past 48 hour(s))  Urinalysis, Routine w reflex microscopic -Urine, Clean Catch     Status:  Abnormal   Collection Time: 03/19/22  7:33 PM  Result Value Ref Range   Color, Urine YELLOW YELLOW   APPearance CLOUDY (A) CLEAR   Specific Gravity, Urine 1.020 1.005 - 1.030   pH 6.5 5.0 - 8.0   Glucose, UA NEGATIVE NEGATIVE mg/dL   Hgb urine dipstick SMALL (A) NEGATIVE   Bilirubin Urine NEGATIVE NEGATIVE   Ketones, ur NEGATIVE NEGATIVE mg/dL   Protein, ur 30 (A) NEGATIVE mg/dL   Nitrite POSITIVE (A) NEGATIVE   Leukocytes,Ua MODERATE (A) NEGATIVE    Comment: Performed at [facility], [address] [place], [place] [address]  Urinalysis, Microscopic (reflex)     Status: Abnormal   Collection Time: 03/19/22  7:33 PM  Result Value Ref Range   RBC / HPF 11-20 0 - 5 RBC/hpf   WBC, UA >50 0 - 5 WBC/hpf   Bacteria, UA MANY (A) NONE SEEN   Squamous Epithelial / HPF 0-5 0 - 5 /HPF   Mucus PRESENT    Budding Yeast PRESENT     Comment: Performed at [facility], [place] [address], [place], [place] [address]  Blood culture (routine x 2)     Status: None (Preliminary result)   Collection Time: 03/19/22  7:48 PM   Specimen: BLOOD RIGHT ARM  Result Value Ref Range   Specimen Description BLOOD RIGHT ARM    Special Requests      BOTTLES DRAWN AEROBIC AND ANAEROBIC Blood Culture adequate volume Performed at [facility], [place] [address], [place], [place] [address]    Culture PENDING    Report Status PENDING   Comprehensive metabolic panel     Status: Abnormal   Collection Time: 03/19/22  7:51 PM  Result Value Ref Range   Sodium 130 (L) 135 - 145 mmol/L   Potassium 3.7 3.5 - 5.1 mmol/L   Chloride 95 (L) 98 - 111 mmol/L   CO2 24 22 - 32 mmol/L   Glucose, Bld 100 (H) 70 - 99 mg/dL    Comment: Glucose reference range applies only to samples taken after fasting for at least 8 hours.   BUN 21 8 - 23 mg/dL   Creatinine, Ser 1.04 (H) 0.44 - 1.00 mg/dL   Calcium 8.6 (L) 8.9 - 10.3 mg/dL   Total Protein 6.9 6.5 - 8.1 g/dL   Albumin 3.0 (L) 3.5 - 5.0 g/dL   AST 30 15 - 41 U/L    ALT 25 0 - 44 U/L   Alkaline Phosphatase 113 38 - 126 U/L   Total Bilirubin 1.2 0.3 - 1.2 mg/dL   GFR, Estimated 56 (L) >60 mL/min    Comment: (NOTE) Calculated using the CKD-EPI Creatinine Equation (2021)    Anion gap 11 5 - 15    Comment: Performed at [place] [address] [place], [place] [address]  Lactic acid, plasma     Status: None   Collection Time: 03/19/22  7:51 PM  Result Value Ref Range   Lactic Acid, Venous 1.9 0.5 - 1.9 mmol/L    Comment: Performed  at [facility], [place] [address], [place], [place] [address]  Lactic acid, plasma     Status: None   Collection Time: 03/19/22 10:19 PM  Result Value Ref Range   Lactic Acid, Venous 1.4 0.5 - 1.9 mmol/L    Comment: Performed at [facility] [address] [place], [place] [address]  CBC with Differential/Platelet     Status: Abnormal   Collection Time: 03/19/22 10:19 PM  Result Value Ref Range   WBC 14.6 (H) 4.0 - 10.5 K/uL   RBC 3.22 (L) 3.87 - 5.11 MIL/uL   Hemoglobin 9.6 (L) 12.0 - 15.0 g/dL   HCT 29.2 (L) 36.0 - 46.0 %   MCV 90.7 80.0 - 100.0 fL   MCH 29.8 26.0 - 34.0 pg   MCHC 32.9 30.0 - 36.0 g/dL   RDW 14.3 11.5 - 15.5 %   Platelets 288 150 - 400 K/uL   nRBC 0.0 0.0 - 0.2 %   Neutrophils Relative % 80 %   Neutro Abs 11.8 (H) 1.7 - 7.7 K/uL   Lymphocytes Relative 8 %   Lymphs Abs 1.2 0.7 - 4.0 K/uL   Monocytes Relative 8 %   Monocytes Absolute 1.2 (H) 0.1 - 1.0 K/uL   Eosinophils Relative 3 %   Eosinophils Absolute 0.4 0.0 - 0.5 K/uL   Basophils Relative 0 %   Basophils Absolute 0.1 0.0 - 0.1 K/uL   Immature Granulocytes 1 %   Abs Immature Granulocytes 0.07 0.00 - 0.07 K/uL    Comment: Performed at [facility], [place] [address], [place], [place] [address]  Resp panel by RT-PCR (RSV, Flu A&B, Covid) Anterior Nasal Swab     Status: None   Collection Time: 03/19/22 10:25 PM   Specimen: Anterior Nasal Swab  Result Value Ref Range   SARS Coronavirus 2 by RT PCR NEGATIVE  NEGATIVE   Influenza A by PCR NEGATIVE NEGATIVE   Influenza B by PCR NEGATIVE NEGATIVE    Comment: (NOTE) The Xpert Xpress SARS-CoV-2/FLU/RSV plus assay is intended as an aid in the diagnosis of influenza from Nasopharyngeal swab specimens and should not be used as a sole basis for treatment. Nasal washings and aspirates are unacceptable for Xpert Xpress SARS-CoV-2/FLU/RSV testing.  Fact Sheet for Patients: EntrepreneurPulse.com.au  Fact Sheet for Healthcare Providers: IncredibleEmployment.be  This test is not yet approved or cleared by the [place] FDA and has been authorized for detection and/or diagnosis of SARS-CoV-2 by FDA under an Emergency Use Authorization (EUA). This EUA will remain in effect (meaning this test can be used) for the duration of the COVID-19 declaration under Section 564(b)(1) of the Act, 21 U.S.C. section 360bbb-3(b)(1), unless the authorization is terminated or revoked.     Resp Syncytial Virus by PCR NEGATIVE NEGATIVE    Comment: (NOTE) Fact Sheet for Patients: EntrepreneurPulse.com.au  Fact Sheet for Healthcare Providers: IncredibleEmployment.be  This test is not yet approved or cleared by the [place] FDA and has been authorized for detection and/or diagnosis of SARS-CoV-2 by FDA under an Emergency Use Authorization (EUA). This EUA will remain in effect (meaning this test can be used) for the duration of the COVID-19 declaration under Section 564(b)(1) of the Act, 21 U.S.C. section 360bbb-3(b)(1), unless the authorization is terminated or revoked.  Performed at [facility], [place] [address], [place], [place] [address]    DG Chest 1 View  Result Date: 03/19/2022 CLINICAL DATA:  Weakness EXAM: CHEST  1 VIEW COMPARISON:  Chest x-ray 12/10/2018 FINDINGS: There is a moderate amount of patchy airspace consolidation in the left mid and upper lung. There is also  faint patchy airspace disease in the right lung base. There is no pleural effusion or pneumothorax. Cardiomediastinal silhouette is within normal limits. Right-sided central venous catheter tip projects over the SVC. No acute fractures are seen. IMPRESSION: Bilateral airspace disease, left greater than right. Findings are concerning for multifocal pneumonia. Follow-up PA and lateral chest x-ray recommended in 4-6 weeks to confirm resolution. Electronically Signed   By: Ronney Asters M.D.   On: 03/19/2022 20:41   CT ABDOMEN PELVIS WO CONTRAST  Result Date: 03/19/2022 CLINICAL DATA:  Recent procedures of nephrostomy tube insertion and removal and removal of spinal abscess. Presenting with fever and weakness. Currently receiving daptomycin for infection. EXAM: CT ABDOMEN AND PELVIS WITHOUT CONTRAST TECHNIQUE: Multidetector CT imaging of the abdomen and pelvis was performed following the standard protocol without IV contrast. RADIATION DOSE REDUCTION: This exam was performed according to the departmental dose-optimization program which includes automated exposure control, adjustment of the mA and/or kV according to patient size and/or use of iterative reconstruction technique. COMPARISON:  CT 02/25/2022 FINDINGS: Lower chest: Patchy airspace opacities in the bilateral lower lung suspicious for pneumonia. Hepatobiliary: Unremarkable liver and biliary tree. Cholecystectomy. Pancreas: Unremarkable. Spleen: Unremarkable. Adrenals/Urinary Tract: Normal adrenal glands. Ureteral stent in the right ureter. Interval removal of the right nephrostomy tube with new dilation of the right renal pelvis. No urinary calculi. Unremarkable bladder. Stomach/Bowel: Normal caliber large and small bowel. Colonic diverticulosis without diverticulitis. Moderate colonic stool load. The appendix is not visualized. Vascular/Lymphatic: Aortic atherosclerotic calcification. No lymphadenopathy. Reproductive: Unremarkable uterus and ovaries.  Other: No free intraperitoneal fluid or air. Musculoskeletal: No acute osseous abnormality. IMPRESSION: 1. Patchy airspace opacities in the bilateral lower lung suspicious for pneumonia. 2. Interval removal of the right nephrostomy tube with new development of dilation of the right pelvis. Recommend correlation for proper functioning of the right ureteral stent. Aortic Atherosclerosis (ICD10-I70.0). Electronically Signed   By: Placido Sou M.D.   On: 03/19/2022 20:27    Pending Labs Unresulted Labs (From admission, onward)     Start     Ordered   03/19/22 2336  Prealbumin  Add-on,   AD        03/19/22 2335   03/19/22 2324  Brain natriuretic peptide  Add-on,   AD        03/19/22 2323   03/19/22 2219  Urinalysis, w/ Reflex to Culture (Infection Suspected) -Urine, Catheterized  (Urine Labs)  Once,   URGENT       Question:  Specimen Source  Answer:  Urine, Catheterized   03/19/22 2219   03/19/22 2208  CBC with Differential  Once,   STAT        03/19/22 2207   03/19/22 2147  Urine Culture (for pregnant, neutropenic or urologic patients or patients with an indwelling urinary catheter)  (Urine Labs)  Once,   URGENT       Question:  Indication  Answer:  Flank Pain   03/19/22 2147   03/19/22 1921  Blood culture (routine x 2)  BLOOD CULTURE X 2,   R (with STAT occurrences)     Question Answer Comment  Patient immune status Normal   Release to patient Immediate      03/19/22 1920   03/19/22 1920  CBC with Differential  Once,   STAT        03/19/22 1920   Signed  and Held  Comprehensive metabolic panel  Tomorrow morning,   R        Signed and Held   Signed and Held  CBC with Differential/Platelet  Tomorrow morning,   R        Signed and Held   Signed and Held  Magnesium  Tomorrow morning,   R        Signed and Held            Vitals/Pain Today's Vitals   03/19/22 2200 03/19/22 2300 03/19/22 2356 03/20/22 0000  BP: 133/63 (!) 147/72  (!) 157/78  Pulse: (!) 101 (!) 107 (!) 112 (!) 113   Resp: (!) 21 (!) 27 (!) 29 (!) 26  Temp: 99.6 F (37.6 C)     TempSrc:      SpO2: 97% 90% (!) 89% 93%  Weight:      Height:      PainSc:        Isolation Precautions Airborne and Contact precautions  Medications Medications  acetaminophen (TYLENOL) tablet 650 mg (has no administration in time range)  azithromycin (ZITHROMAX) tablet 500 mg (has no administration in time range)  ceFEPIme (MAXIPIME) 2 g in sodium chloride 0.9 % 100 mL IVPB (has no administration in time range)  vancomycin (VANCOREADY) IVPB 1500 mg/300 mL (has no administration in time range)  vancomycin (VANCOCIN) IVPB 1000 mg/200 mL premix (has no administration in time range)  ceFEPIme (MAXIPIME) 2 g in sodium chloride 0.9 % 100 mL IVPB (0 g Intravenous Stopped 03/19/22 2247)  sodium chloride 0.9 % bolus 1,000 mL (0 mLs Intravenous Stopped 03/19/22 2328)  acetaminophen (TYLENOL) tablet 1,000 mg (1,000 mg Oral Given 03/20/22 0003)    Mobility non-ambulatory     Focused Assessments Cardiac Assessment Handoff:  Cardiac Rhythm: Sinus tachycardia Lab Results  Component Value Date   CKTOTAL 40 03/04/2022   No results found for: "DDIMER" Does the Patient currently have chest pain? No   , Neuro Assessment Handoff:  Swallow screen pass? Yes  Cardiac Rhythm: Sinus tachycardia       Neuro Assessment: Within Defined Limits Neuro Checks:      Has TPA been given? No If patient is a Neuro Trauma and patient is going to OR before floor call report to [place] nurse: [phone] or [phone]   R Recommendations: See Admitting Provider Note  Report given to:   Additional Notes:

## 2022-03-20 NOTE — Progress Notes (Signed)
Message sent to IV team for assistance with Picc line dressing change.

## 2022-03-20 NOTE — H&P (Incomplete)
History and Physical    Melissa Leon [id number] DOB: 10-21-1945 DOA: 03/19/2022  DOS: the patient was seen and examined on 03/19/2022  PCP: Katherina Mires, MD   Patient coming from: Home  I have personally briefly reviewed patient's old medical records in [place]  CC: fever, weakness HPI: 77 year old female history of diabetes, hypertension with a recent decline in her health.  This all started in December 2023. She was admitted to the hospital on January 04, 2022.  She was found to have an extremely enlarged right kidney.  With severe hydronephrosis without evidence of hydroureter.  She was seen in consultation by urology.  She underwent percutaneous nephrostomy tube placement on 01/05/2022.  She was discharged home.  In January 2024, she followed up with urology in the outpatient setting.  She was noted to have an infection at the entry site of her nephrostomy tube.  She was placed on oral Bactrim at that time.  She followed up with urology in February 2024.  She ended having a cystoscopy along with a right double-J stent placement and her nephrostomy tube was removed.  This occurred on February 27, 2022.  After discharge from outpatient surgery, family noted that she was extremely weak and brought her back to the hospital.  She was admitted to the hospital.  The next day, she underwent MRI scanning which showed an epidural abscess near her lumbar spine.  She underwent lumbar laminectomy on 03/01/2022.  Cultures grew out MRSA.  Echocardiogram was negative for endocarditis.  She was seen by infectious disease during that admission.  PICC line was placed and she was discharged to home on IV daptomycin.  Today, the daughter took the patient to neurosurgery for follow-up.  Daughter states the patient's been having lots of urinary frequency but no pain.  Patient started having weakness about 2 days ago and been having difficulty walking.  She also spiked a fever today.  Daughter  brought her back to the ER for evaluation.  On arrival temp 101.5 heart rate 108 blood pressure 155/70 satting 96% on room air.  Labs:  White count 14.6, hemoglobin 9.6, platelets of 288  Sodium 130, potassium 3.7, chloride 95, bicarb of 24, BUN of 21, creatinine 1.0  COVID-negative, influenza negative, RSV negative.  CT abdomen pelvis demonstrates continued right ureteral stent.  Chest x-ray demonstrated new multifocal pneumonia left greater than right.  [facility] contacted for admission.   ED Course: ***  Review of Systems:  Review of Systems  Constitutional:  Positive for fever and malaise/fatigue.  HENT: Negative.    Eyes: Negative.   Respiratory:  Positive for shortness of breath.   Cardiovascular: Negative.   Gastrointestinal: Negative.   Genitourinary:  Positive for urgency. Negative for dysuria.  Musculoskeletal: Negative.   Skin: Negative.   Neurological:  Positive for weakness.  Endo/Heme/Allergies: Negative.   Psychiatric/Behavioral: Negative.    All other systems reviewed and are negative.   Past Medical History:  Diagnosis Date  . Anemia   . Arthritis   . Depression   . Diabetes mellitus ([place])   . Hypertension   . Pneumonia   . Spinal stenosis     Past Surgical History:  Procedure Laterality Date  . APPENDECTOMY    . CHOLECYSTECTOMY    . CYSTOSCOPY WITH RETROGRADE PYELOGRAM, URETEROSCOPY AND STENT PLACEMENT Right 02/27/2022   Procedure: TURBT, CYSTOSCOPY WITH RIGHT RETROGRADE PYELOGRAM, RIGHT URETEROSCOPY, BIOPSY AND STENT PLACEMENT;  Surgeon: Janith Lima, MD;  Location: [facility];  Service: Urology;  Laterality: Right;  30 MINUTES NEEDED FOR CASE  . IR NEPHROSTOMY PLACEMENT RIGHT  01/05/2022  . LUMBAR LAMINECTOMY FOR EPIDURAL ABSCESS N/A 03/01/2022   Procedure: LUMBAR LAMINECTOMY LUMBAR THREE-FOUR, LUMBAR FOUR- FIVE FOR EPIDURAL ABSCESS;  Surgeon: Eustace Moore, MD;  Location: [place];  Service: Neurosurgery;  Laterality: N/A;  . UTERINE  FIBROID SURGERY    . WRIST GANGLION EXCISION Right      reports that she has quit smoking. Her smoking use included cigarettes. She has a 0.50 pack-year smoking history. She has never used smokeless tobacco. She reports that she does not currently use alcohol. She reports that she does not use drugs.  Allergies  Allergen Reactions  . Choline Fenofibrate Other (See Comments)    ELEVATED LFT(s)  . Penicillins Hives  . Ozempic (0.25 Or 0.5 Mg-Dose) [Semaglutide(0.25 Or 0.'5mg'$ -Dos)] Nausea Only and Other (See Comments)    SEVERE NAUSEA  . Statins     Aches, pain    Family History  Problem Relation Age of Onset  . Breast cancer Neg Hx     Prior to Admission medications   Medication Sig Start Date End Date Taking? Authorizing Provider  acetaminophen (TYLENOL) 650 MG CR tablet Take 650 mg by mouth every 8 (eight) hours as needed for pain.    [provider]  amLODipine (NORVASC) 5 MG tablet Take 5 mg by mouth at bedtime. 03/27/21   [provider]  aspirin 81 MG chewable tablet Chew 81 mg by mouth at bedtime.    [provider]  daptomycin (CUBICIN) IVPB Inject 650 mg into the vein daily. Indication:  MRSA epidural abscess First Dose: Yes Last Day of Therapy:  04/12/22 Labs - Once weekly:  CBC/D, BMP, and CPK Labs - Every other week:  ESR and CRP Method of administration: IV Push Pull PICC line at the completion of IV antibiotics Method of administration may be changed at the discretion of home infusion pharmacist based upon assessment of the patient and/or caregiver's ability to self-administer the medication ordered. 03/06/22 04/14/22  Bonnielee Haff, MD  diclofenac Sodium (VOLTAREN) 1 % GEL Apply 4 g topically 4 (four) times daily. 02/25/22   Deno Etienne, DO  docusate sodium (COLACE) 100 MG capsule Take 1 capsule (100 mg total) by mouth daily as needed for up to 30 doses. 02/27/22   Janith Lima, MD  glimepiride (AMARYL) 4 MG tablet Take 4 mg by mouth daily.  01/28/22   [provider]  hydrochlorothiazide (HYDRODIURIL) 25 MG tablet Take 25 mg by mouth every morning. 01/15/22   [provider]  losartan (COZAAR) 100 MG tablet Take 100 mg by mouth in the morning. 04/21/21   [provider]  metFORMIN (GLUCOPHAGE-XR) 500 MG 24 hr tablet Take 1,000 mg by mouth 2 (two) times daily with a meal. 03/27/21   [provider]  oxyCODONE-acetaminophen (PERCOCET) 5-325 MG tablet Take 1 tablet by mouth every 4 (four) hours as needed for severe pain. 03/06/22   Bonnielee Haff, MD  polyethylene glycol powder (GLYCOLAX/MIRALAX) 17 GM/SCOOP powder Dissolve 1 capful(17 g) in water and drink by mouth 2 (two) times daily. 03/06/22   Bonnielee Haff, MD  senna (SENOKOT) 8.6 MG TABS tablet Take 1 tablet (8.6 mg total) by mouth 2 (two) times daily. 03/06/22   Bonnielee Haff, MD    Physical Exam: Vitals:   03/19/22 2056 03/19/22 2100 03/19/22 2200 03/19/22 2300  BP: (!) 145/59 136/64 133/63 (!) 147/72  Pulse: (!) 103 Marland Kitchen)  102 (!) 101 (!) 107  Resp: (!) 21 (!) 22 (!) 21 (!) 27  Temp: (!) 100.6 F (38.1 C)  99.6 F (37.6 C)   TempSrc: Oral     SpO2: 97% 98% 97% 90%  Weight:      Height:        Physical Exam Vitals and nursing note reviewed.  Constitutional:      Appearance: She is ill-appearing and toxic-appearing.  HENT:     Head: Normocephalic and atraumatic.     Nose: Nose normal.  Cardiovascular:     Rate and Rhythm: Regular rhythm. Tachycardia present.  Pulmonary:     Breath sounds: Wheezing present.  Abdominal:     General: Bowel sounds are normal.     Tenderness: There is no abdominal tenderness. There is no guarding.  Musculoskeletal:     Right lower leg: 2+ Pitting Edema present.     Left lower leg: 2+ Pitting Edema present.     Right ankle: Swelling present.     Left ankle: Swelling present.     Right foot: Swelling present.     Left foot: Swelling present.  Skin:    General: Skin is warm and dry.     Capillary  Refill: Capillary refill takes less than 2 seconds.  Neurological:     General: No focal deficit present.     Mental Status: She is alert and oriented to person, place, and time.      Labs on Admission: I have personally reviewed following labs and imaging studies  CBC: Recent Labs  Lab 03/19/22 2219  WBC 14.6*  NEUTROABS 11.8*  HGB 9.6*  HCT 29.2*  MCV 90.7  PLT 123XX123   Basic Metabolic Panel: Recent Labs  Lab 03/19/22 1951  NA 130*  K 3.7  CL 95*  CO2 24  GLUCOSE 100*  BUN 21  CREATININE 1.04*  CALCIUM 8.6*   GFR: Estimated Creatinine Clearance: 44.8 mL/min (A) (by C-G formula based on SCr of 1.04 mg/dL (H)). Liver Function Tests: Recent Labs  Lab 03/19/22 1951  AST 30  ALT 25  ALKPHOS 113  BILITOT 1.2  PROT 6.9  ALBUMIN 3.0*   No results for input(s): "LIPASE", "AMYLASE" in the last 168 hours. No results for input(s): "AMMONIA" in the last 168 hours. Coagulation Profile: No results for input(s): "INR", "PROTIME" in the last 168 hours. Cardiac Enzymes: No results for input(s): "CKTOTAL", "CKMB", "CKMBINDEX", "TROPONINI", "TROPONINIHS" in the last 168 hours. BNP (last 3 results) No results for input(s): "PROBNP" in the last 8760 hours. HbA1C: No results for input(s): "HGBA1C" in the last 72 hours. CBG: No results for input(s): "GLUCAP" in the last 168 hours. Lipid Profile: No results for input(s): "CHOL", "HDL", "LDLCALC", "TRIG", "CHOLHDL", "LDLDIRECT" in the last 72 hours. Thyroid Function Tests: No results for input(s): "TSH", "T4TOTAL", "FREET4", "T3FREE", "THYROIDAB" in the last 72 hours. Anemia Panel: No results for input(s): "VITAMINB12", "FOLATE", "FERRITIN", "TIBC", "IRON", "RETICCTPCT" in the last 72 hours. Urine analysis:    Component Value Date/Time   COLORURINE YELLOW 03/19/2022 1933   APPEARANCEUR CLOUDY (A) 03/19/2022 1933   LABSPEC 1.020 03/19/2022 1933   PHURINE 6.5 03/19/2022 1933   GLUCOSEU NEGATIVE 03/19/2022 1933   HGBUR SMALL  (A) 03/19/2022 1933   BILIRUBINUR NEGATIVE 03/19/2022 [place] 03/19/2022 1933   PROTEINUR 30 (A) 03/19/2022 1933   NITRITE POSITIVE (A) 03/19/2022 1933   LEUKOCYTESUR MODERATE (A) 03/19/2022 1933    Radiological Exams on Admission: I have  personally reviewed images DG Chest 1 View  Result Date: 03/19/2022 CLINICAL DATA:  Weakness EXAM: CHEST  1 VIEW COMPARISON:  Chest x-ray 12/10/2018 FINDINGS: There is a moderate amount of patchy airspace consolidation in the left mid and upper lung. There is also faint patchy airspace disease in the right lung base. There is no pleural effusion or pneumothorax. Cardiomediastinal silhouette is within normal limits. Right-sided central venous catheter tip projects over the SVC. No acute fractures are seen. IMPRESSION: Bilateral airspace disease, left greater than right. Findings are concerning for multifocal pneumonia. Follow-up PA and lateral chest x-ray recommended in 4-6 weeks to confirm resolution. Electronically Signed   By: Ronney Asters M.D.   On: 03/19/2022 20:41   CT ABDOMEN PELVIS WO CONTRAST  Result Date: 03/19/2022 CLINICAL DATA:  Recent procedures of nephrostomy tube insertion and removal and removal of spinal abscess. Presenting with fever and weakness. Currently receiving daptomycin for infection. EXAM: CT ABDOMEN AND PELVIS WITHOUT CONTRAST TECHNIQUE: Multidetector CT imaging of the abdomen and pelvis was performed following the standard protocol without IV contrast. RADIATION DOSE REDUCTION: This exam was performed according to the departmental dose-optimization program which includes automated exposure control, adjustment of the mA and/or kV according to patient size and/or use of iterative reconstruction technique. COMPARISON:  CT 02/25/2022 FINDINGS: Lower chest: Patchy airspace opacities in the bilateral lower lung suspicious for pneumonia. Hepatobiliary: Unremarkable liver and biliary tree. Cholecystectomy. Pancreas:  Unremarkable. Spleen: Unremarkable. Adrenals/Urinary Tract: Normal adrenal glands. Ureteral stent in the right ureter. Interval removal of the right nephrostomy tube with new dilation of the right renal pelvis. No urinary calculi. Unremarkable bladder. Stomach/Bowel: Normal caliber large and small bowel. Colonic diverticulosis without diverticulitis. Moderate colonic stool load. The appendix is not visualized. Vascular/Lymphatic: Aortic atherosclerotic calcification. No lymphadenopathy. Reproductive: Unremarkable uterus and ovaries. Other: No free intraperitoneal fluid or air. Musculoskeletal: No acute osseous abnormality. IMPRESSION: 1. Patchy airspace opacities in the bilateral lower lung suspicious for pneumonia. 2. Interval removal of the right nephrostomy tube with new development of dilation of the right pelvis. Recommend correlation for proper functioning of the right ureteral stent. Aortic Atherosclerosis (ICD10-I70.0). Electronically Signed   By: Placido Sou M.D.   On: 03/19/2022 20:27    EKG: My personal interpretation of EKG shows: sinus tachycardia    Assessment/Plan Principal Problem:   Acute cystitis without hematuria Active Problems:   Sepsis secondary to UTI ([place])   Multifocal pneumonia   Acute respiratory failure with hypoxia (H[facility])   Bacterial spinal epidural abscess - MRSA   DM2 (diabetes mellitus, type 2) (HCC)   Essential hypertension   Ureteral obstruction, right   S/P lumbar laminectomy   Hyponatremia    Assessment and Plan: * Acute cystitis without hematuria Admit to progressive care bed.  Start IV cefepime.  Send cath urine and culture.  Bacterial spinal epidural abscess - MRSA Was discharged on daptomycin via picc line.  Will need to change over to vanco to cover for potential staph pneumonia.  Acute respiratory failure with hypoxia (HCC) Now on supplemental O2 due to pneumonia. Change to progressive care bed. Pt is a FULL CODE  Multifocal  pneumonia Unclear the etiology of her pneumonia.  Patient's been on daptomycin which does not treat staph pneumonia.  Continue with IV cefepime.  Switch her over to IV vancomycin.  She will need ID consult in the morning.  I will send a secure chat to Dr. Linus Salmons to see the patient.  Sepsis secondary to UTI [facility]) Fulfills sepsis criteria.  Fever of 101.5. WBC 14. Pyuria and nitrites on UA. Pneumonia on CXR.  S/P lumbar laminectomy Was seen today by neurosurgery in the office.  Ureteral obstruction, right Still has ureteral stent from 02-27-2021. Follows with Dr. Abner Greenspan with [facility].  Essential hypertension Hold losartan since we are starting vanco. Pt does have LE edema now. Will hold norvasc.  DM2 (diabetes mellitus, type 2) (HCC) Hold metformin. Add ssi.  Hyponatremia Possibly due to HCTZ. Will hold.    DVT prophylaxis: SQ Heparin Code Status: Full Code Family Communication: discussed with pt and dtr melissa at bedside  Disposition Plan: return home  Consults called: ID consulted via secure chat(comer)  Admission status: Inpatient,  progressive   Kristopher Oppenheim, DO [facility] 03/20/2022, 12:00 AM

## 2022-03-20 NOTE — Consult Note (Signed)
Melissa Leon for Infectious Disease    Date of Admission:  03/19/2022   Total days of inpatient antibiotics 1        Reason for Consult: Epidural abscess    Principal Problem:   Acute cystitis without hematuria Active Problems:   Sepsis without acute organ dysfunction (HCC)   Hyponatremia   DM2 (diabetes mellitus, type 2) (HCC)   Essential hypertension   Ureteral obstruction, right   S/P lumbar laminectomy   Pneumonia due to infectious organism   Acute respiratory failure with hypoxia (HCC)   Bacterial spinal epidural abscess - MRSA   Urinary tract infection without hematuria   Assessment: 76 YF with Hx of DM, lumbar epidural abscess L3 to L5-S1, SA, paraspinal abscess SP evacuation of epidural abscess with Cx+MRSA discharged on dapto x 8 weeks EOT 04/12/22 admitted with acute cysititis   #Acute cystitis with renal pelvis dilation -CT showed patchy airspace opacity in the bilateral lower lung suspicious for pneumonia.  Interval removal of right nephrostomy tube with new development of right pelvis.  Recommend correlation with proper function of right ureteral stent. -Patient started on vancomycin, azithromycin and cefepime for concern for community-acquired pneumonia.  Noted eosinophil count normal as such suspected to induce pneumonia. - Patient states she has no respiratory complaints, notes that she had a CT abdomen pelvis done on with urology outpatient a couple months ago and airspace opacities were noted at that point.  Given that she has no respiratory complaints will peel off azithromycin.  Will continue vancomycin cefepime while wait for urine and blood cultures to mature.   #Lumbar epidural abscess SP L3-L5 evacuation, hemilaminectomy/medial  facetetectomy with Cx+ MRSA -Discharged on dapto x 8 weeks EOT 04/12/22 -On 2/23 PICC line noted to be bleeding.  Recommendations: - D/C azithromycin  - Follow blood Cx - Follow urine cultures - Continue vancomycin  and cefepime while cultures mature - Consult urology given questionable function of right ureteral stent - Follow blood Cx  I have personally spent 95 minutes involved in face-to-face and non-face-to-face activities for this patient on the day of the visit. Professional time spent includes the following activities: Preparing to see the patient (review of tests), Obtaining and/or reviewing separately obtained history (admission/discharge record), Performing a medically appropriate examination and/or evaluation , Ordering medications/tests/procedures, referring and communicating with other health care professionals, Documenting clinical information in the EMR, Independently interpreting results (not separately reported), Communicating results to the patient/family/caregiver, Counseling and educating the patient/family/caregiver and Care coordination (not separately reported).   Microbiology:   Antibiotics: Vancomycin 2/29 Cefepime 2/29- Azithromycin 3/001  Cultures: Blood 2/29 Urine 2/29    HPI: Melissa Leon is a 77 y.o. female epidural abscess s/p evacuation 1 to me 2/11 with cultures growing.  Discharged daptomycin 8 weeks EOT 04/12/2022, diabetes, hypertension, recent decline in health, hospitalization in December for severe hydronephrosis followed by urology underwent percutaneous nephrostomy tube placement 01/05/2022.  She has had increased urinary frequency along with weakness x 2 days prior to admission, note she spiked a fever and daughter took her to the ER.  On arrival temp of 101.5, WBC 14.6 K.  CT abdomen pelvis showed right renal pelvis dilatation, airspace opacity.  Patient started on Vanco, cefepime, azithromycin.  Infectious disease engaged given patient has been on daptomycin.  Review of Systems: Review of Systems  All other systems reviewed and are negative.   Past Medical History:  Diagnosis Date   Anemia  Arthritis    Depression    Diabetes mellitus ([place])     Hypertension    Pneumonia    Spinal stenosis     Social History   Tobacco Use   Smoking status: Former    Packs/day: 0.25    Years: 2.00    Total pack years: 0.50    Types: Cigarettes   Smokeless tobacco: Never  Vaping Use   Vaping Use: Never used  Substance Use Topics   Alcohol use: Not Currently   Drug use: Never    Family History  Problem Relation Age of Onset   Breast cancer Neg Hx    Scheduled Meds:  azithromycin  500 mg Oral Daily   Chlorhexidine Gluconate Cloth  6 each Topical Daily   heparin  5,000 Units Subcutaneous Q8H   insulin aspart  0-15 Units Subcutaneous TID WC   Continuous Infusions:  ceFEPime (MAXIPIME) IV Stopped (03/20/22 0850)   vancomycin     PRN Meds:.acetaminophen, albuterol, ondansetron **OR** ondansetron (ZOFRAN) IV, mouth rinse Allergies  Allergen Reactions   Choline Fenofibrate Other (See Comments)    ELEVATED LFT(s)   Penicillins Hives   Ozempic (0.25 Or 0.5 Mg-Dose) [Semaglutide(0.25 Or 0.'5mg'$ -Dos)] Nausea Only and Other (See Comments)    SEVERE NAUSEA   Statins     Aches, pain    OBJECTIVE: Blood pressure (!) 115/54, pulse 88, temperature 98.6 F (37 C), temperature source Oral, resp. rate 18, height '5\' 7"'$  (1.702 m), weight 73 kg, SpO2 98 %.  Physical Exam Constitutional:      Appearance: Normal appearance.  HENT:     Head: Normocephalic and atraumatic.     Right Ear: Tympanic membrane normal.     Left Ear: Tympanic membrane normal.     Nose: Nose normal.     Mouth/Throat:     Mouth: Mucous membranes are moist.  Eyes:     Extraocular Movements: Extraocular movements intact.     Conjunctiva/sclera: Conjunctivae normal.     Pupils: Pupils are equal, round, and reactive to light.  Cardiovascular:     Rate and Rhythm: Normal rate and regular rhythm.     Heart sounds: No murmur heard.    No friction rub. No gallop.  Pulmonary:     Effort: Pulmonary effort is normal.     Breath sounds: Normal breath sounds.  Abdominal:      General: Abdomen is flat.     Palpations: Abdomen is soft.  Musculoskeletal:        General: Normal range of motion.  Skin:    General: Skin is warm and dry.  Neurological:     General: No focal deficit present.     Mental Status: She is alert and oriented to person, place, and time.  Psychiatric:        Mood and Affect: Mood normal.     Lab Results Lab Results  Component Value Date   WBC 12.5 (H) 03/20/2022   HGB 8.5 (L) 03/20/2022   HCT 25.8 (L) 03/20/2022   MCV 90.8 03/20/2022   PLT 258 03/20/2022    Lab Results  Component Value Date   CREATININE 0.92 03/20/2022   BUN 17 03/20/2022   NA 131 (L) 03/20/2022   K 3.2 (L) 03/20/2022   CL 99 03/20/2022   CO2 22 03/20/2022    Lab Results  Component Value Date   ALT 21 03/20/2022   AST 27 03/20/2022   ALKPHOS 93 03/20/2022   BILITOT 1.4 (H) 03/20/2022  Laurice Record, MD [facility] 03/20/2022, 3:16 PM

## 2022-03-20 NOTE — Progress Notes (Signed)
Patient desats occasionally to 86% on room air when sleeping.  2L O2 via [place] administered.  Patient informed to wear oxygen when sleeping.  Continuous pulse ox setup at bedside.  Patient 98-100% when awake on 2L.

## 2022-03-20 NOTE — H&P (Addendum)
History and Physical    Melissa Leon Leon [id number] DOB: 1945-01-27 DOA: 03/19/2022  DOS: the patient was seen and examined on 03/19/2022  PCP: Katherina Mires, MD   Patient coming from: Home  I have personally briefly reviewed patient's old medical records in [place]  CC: fever, weakness HPI: 77 year old female history of diabetes, hypertension with a recent decline in her health.  This all started in December 2023. She was admitted to the hospital on January 04, 2022.  She was found to have an extremely enlarged right kidney.  With severe hydronephrosis without evidence of hydroureter.  She was seen in consultation by urology.  She underwent percutaneous nephrostomy tube placement on 01/05/2022.  She was discharged home.  In January 2024, she followed up with urology in the outpatient setting.  She was noted to have an infection at the entry site of her nephrostomy tube.  She was placed on oral Bactrim at that time.  She followed up with urology in February 2024.  She ended having a cystoscopy along with a right double-J stent placement and her nephrostomy tube was removed.  This occurred on February 27, 2022.  After discharge from outpatient surgery, family noted that she was extremely weak and brought her back to the hospital.  She was admitted to the hospital.  The next day, she underwent MRI scanning which showed an epidural abscess near her lumbar spine.  She underwent lumbar laminectomy on 03/01/2022.  Cultures grew out MRSA.  Echocardiogram was negative for endocarditis.  She was seen by infectious disease during that admission.  PICC line was placed and she was discharged to home on IV daptomycin.  Today, the daughter took the patient to neurosurgery for follow-up.  Daughter states the patient's been having lots of urinary frequency but no pain.  Patient started having weakness about 2 days ago and been having difficulty walking.  She also spiked a fever today.  Daughter  brought her back to the ER for evaluation.  On arrival temp 101.5 heart rate 108 blood pressure 155/70 satting 96% on room air.  Labs:  White count 14.6, hemoglobin 9.6, platelets of 288  Sodium 130, potassium 3.7, chloride 95, bicarb of 24, BUN of 21, creatinine 1.0  COVID-negative, influenza negative, RSV negative.  CT abdomen pelvis demonstrates continued right ureteral stent.  Chest x-ray demonstrated new multifocal pneumonia left greater than right.  [facility] contacted for admission.   ED Course: CXR shows multifocal pneumonia. WBC 14K  Review of Systems:  Review of Systems  Constitutional:  Positive for fever and malaise/fatigue.  HENT: Negative.    Eyes: Negative.   Respiratory:  Positive for shortness of breath.   Cardiovascular: Negative.   Gastrointestinal: Negative.   Genitourinary:  Positive for urgency. Negative for dysuria.  Musculoskeletal: Negative.   Skin: Negative.   Neurological:  Positive for weakness.  Endo/Heme/Allergies: Negative.   Psychiatric/Behavioral: Negative.    All other systems reviewed and are negative.   Past Medical History:  Diagnosis Date   Anemia    Arthritis    Depression    Diabetes mellitus ([place])    Hypertension    Pneumonia    Spinal stenosis     Past Surgical History:  Procedure Laterality Date   APPENDECTOMY     CHOLECYSTECTOMY     CYSTOSCOPY WITH RETROGRADE PYELOGRAM, URETEROSCOPY AND STENT PLACEMENT Right 02/27/2022   Procedure: TURBT, CYSTOSCOPY WITH RIGHT RETROGRADE PYELOGRAM, RIGHT URETEROSCOPY, BIOPSY AND STENT PLACEMENT;  Surgeon: Janith Lima,  MD;  Location: [facility];  Service: Urology;  Laterality: Right;  30 MINUTES NEEDED FOR CASE   IR NEPHROSTOMY PLACEMENT RIGHT  01/05/2022   LUMBAR LAMINECTOMY FOR EPIDURAL ABSCESS N/A 03/01/2022   Procedure: LUMBAR LAMINECTOMY LUMBAR THREE-FOUR, LUMBAR FOUR- FIVE FOR EPIDURAL ABSCESS;  Surgeon: Eustace Moore, MD;  Location: [facility];  Service: Neurosurgery;   Laterality: N/A;   UTERINE FIBROID SURGERY     WRIST GANGLION EXCISION Right      reports that she has quit smoking. Her smoking use included cigarettes. She has a 0.50 pack-year smoking history. She has never used smokeless tobacco. She reports that she does not currently use alcohol. She reports that she does not use drugs.  Allergies  Allergen Reactions   Choline Fenofibrate Other (See Comments)    ELEVATED LFT(s)   Penicillins Hives   Ozempic (0.25 Or 0.5 Mg-Dose) [Semaglutide(0.25 Or 0.'5mg'$ -Dos)] Nausea Only and Other (See Comments)    SEVERE NAUSEA   Statins     Aches, pain    Family History  Problem Relation Age of Onset   Breast cancer Neg Hx     Prior to Admission medications   Medication Sig Start Date End Date Taking? Authorizing Provider  acetaminophen (TYLENOL) 650 MG CR tablet Take 650 mg by mouth every 8 (eight) hours as needed for pain.    [provider]  amLODipine (NORVASC) 5 MG tablet Take 5 mg by mouth at bedtime. 03/27/21   [provider]  aspirin 81 MG chewable tablet Chew 81 mg by mouth at bedtime.    [provider]  daptomycin (CUBICIN) IVPB Inject 650 mg into the vein daily. Indication:  MRSA epidural abscess First Dose: Yes Last Day of Therapy:  04/12/22 Labs - Once weekly:  CBC/D, BMP, and CPK Labs - Every other week:  ESR and CRP Method of administration: IV Push Pull PICC line at the completion of IV antibiotics Method of administration may be changed at the discretion of home infusion pharmacist based upon assessment of the patient and/or caregiver's ability to self-administer the medication ordered. 03/06/22 04/14/22  Bonnielee Haff, MD  diclofenac Sodium (VOLTAREN) 1 % GEL Apply 4 g topically 4 (four) times daily. 02/25/22   Deno Etienne, DO  docusate sodium (COLACE) 100 MG capsule Take 1 capsule (100 mg total) by mouth daily as needed for up to 30 doses. 02/27/22   Janith Lima, MD  glimepiride (AMARYL) 4 MG tablet Take 4 mg  by mouth daily. 01/28/22   [provider]  hydrochlorothiazide (HYDRODIURIL) 25 MG tablet Take 25 mg by mouth every morning. 01/15/22   [provider]  losartan (COZAAR) 100 MG tablet Take 100 mg by mouth in the morning. 04/21/21   [provider]  metFORMIN (GLUCOPHAGE-XR) 500 MG 24 hr tablet Take 1,000 mg by mouth 2 (two) times daily with a meal. 03/27/21   [provider]  oxyCODONE-acetaminophen (PERCOCET) 5-325 MG tablet Take 1 tablet by mouth every 4 (four) hours as needed for severe pain. 03/06/22   Bonnielee Haff, MD  polyethylene glycol powder (GLYCOLAX/MIRALAX) 17 GM/SCOOP powder Dissolve 1 capful(17 g) in water and drink by mouth 2 (two) times daily. 03/06/22   Bonnielee Haff, MD  senna (SENOKOT) 8.6 MG TABS tablet Take 1 tablet (8.6 mg total) by mouth 2 (two) times daily. 03/06/22   Bonnielee Haff, MD    Physical Exam: Vitals:   03/19/22 2200 03/19/22 2300 03/19/22 2356 03/20/22 0000  BP: 133/63 (!) 147/72  Marland Kitchen)  157/78  Pulse: (!) 101 (!) 107 (!) 112 (!) 113  Resp: (!) 21 (!) 27 (!) 29 (!) 26  Temp: 99.6 F (37.6 C)     TempSrc:      SpO2: 97% 90% (!) 89% 93%  Weight:      Height:        Physical Exam Vitals and nursing note reviewed.  Constitutional:      Appearance: She is ill-appearing and toxic-appearing.  HENT:     Head: Normocephalic and atraumatic.     Nose: Nose normal.  Cardiovascular:     Rate and Rhythm: Regular rhythm. Tachycardia present.  Pulmonary:     Breath sounds: Wheezing present.  Abdominal:     General: Bowel sounds are normal.     Tenderness: There is no abdominal tenderness. There is no guarding.  Musculoskeletal:     Right lower leg: 2+ Pitting Edema present.     Left lower leg: 2+ Pitting Edema present.     Right ankle: Swelling present.     Left ankle: Swelling present.     Right foot: Swelling present.     Left foot: Swelling present.     Comments: PICC line right upper arm. No evidence of infection at  insertion site.  Skin:    General: Skin is warm and dry.     Capillary Refill: Capillary refill takes less than 2 seconds.  Neurological:     General: No focal deficit present.     Mental Status: She is alert and oriented to person, place, and time.      Labs on Admission: I have personally reviewed following labs and imaging studies  CBC: Recent Labs  Lab 03/19/22 2219  WBC 14.6*  NEUTROABS 11.8*  HGB 9.6*  HCT 29.2*  MCV 90.7  PLT 123XX123   Basic Metabolic Panel: Recent Labs  Lab 03/19/22 1951  NA 130*  K 3.7  CL 95*  CO2 24  GLUCOSE 100*  BUN 21  CREATININE 1.04*  CALCIUM 8.6*   GFR: Estimated Creatinine Clearance: 44.8 mL/min (A) (by C-G formula based on SCr of 1.04 mg/dL (H)). Liver Function Tests: Recent Labs  Lab 03/19/22 1951  AST 30  ALT 25  ALKPHOS 113  BILITOT 1.2  PROT 6.9  ALBUMIN 3.0*   No results for input(s): "LIPASE", "AMYLASE" in the last 168 hours. No results for input(s): "AMMONIA" in the last 168 hours. Coagulation Profile: No results for input(s): "INR", "PROTIME" in the last 168 hours. Cardiac Enzymes: No results for input(s): "CKTOTAL", "CKMB", "CKMBINDEX", "TROPONINI", "TROPONINIHS" in the last 168 hours. BNP (last 3 results) No results for input(s): "PROBNP" in the last 8760 hours. HbA1C: No results for input(s): "HGBA1C" in the last 72 hours. CBG: No results for input(s): "GLUCAP" in the last 168 hours. Lipid Profile: No results for input(s): "CHOL", "HDL", "LDLCALC", "TRIG", "CHOLHDL", "LDLDIRECT" in the last 72 hours. Thyroid Function Tests: No results for input(s): "TSH", "T4TOTAL", "FREET4", "T3FREE", "THYROIDAB" in the last 72 hours. Anemia Panel: No results for input(s): "VITAMINB12", "FOLATE", "FERRITIN", "TIBC", "IRON", "RETICCTPCT" in the last 72 hours. Urine analysis:    Component Value Date/Time   COLORURINE YELLOW 03/19/2022 1933   APPEARANCEUR CLOUDY (A) 03/19/2022 1933   LABSPEC 1.020 03/19/2022 1933    PHURINE 6.5 03/19/2022 1933   GLUCOSEU NEGATIVE 03/19/2022 1933   HGBUR SMALL (A) 03/19/2022 1933   BILIRUBINUR NEGATIVE 03/19/2022 [place] 03/19/2022 1933   PROTEINUR 30 (A) 03/19/2022 1933   NITRITE  POSITIVE (A) 03/19/2022 1933   LEUKOCYTESUR MODERATE (A) 03/19/2022 1933    Radiological Exams on Admission: I have personally reviewed images DG Chest 1 View  Result Date: 03/19/2022 CLINICAL DATA:  Weakness EXAM: CHEST  1 VIEW COMPARISON:  Chest x-ray 12/10/2018 FINDINGS: There is a moderate amount of patchy airspace consolidation in the left mid and upper lung. There is also faint patchy airspace disease in the right lung base. There is no pleural effusion or pneumothorax. Cardiomediastinal silhouette is within normal limits. Right-sided central venous catheter tip projects over the SVC. No acute fractures are seen. IMPRESSION: Bilateral airspace disease, left greater than right. Findings are concerning for multifocal pneumonia. Follow-up PA and lateral chest x-ray recommended in 4-6 weeks to confirm resolution. Electronically Signed   By: Ronney Asters M.D.   On: 03/19/2022 20:41   CT ABDOMEN PELVIS WO CONTRAST  Result Date: 03/19/2022 CLINICAL DATA:  Recent procedures of nephrostomy tube insertion and removal and removal of spinal abscess. Presenting with fever and weakness. Currently receiving daptomycin for infection. EXAM: CT ABDOMEN AND PELVIS WITHOUT CONTRAST TECHNIQUE: Multidetector CT imaging of the abdomen and pelvis was performed following the standard protocol without IV contrast. RADIATION DOSE REDUCTION: This exam was performed according to the departmental dose-optimization program which includes automated exposure control, adjustment of the mA and/or kV according to patient size and/or use of iterative reconstruction technique. COMPARISON:  CT 02/25/2022 FINDINGS: Lower chest: Patchy airspace opacities in the bilateral lower lung suspicious for pneumonia.  Hepatobiliary: Unremarkable liver and biliary tree. Cholecystectomy. Pancreas: Unremarkable. Spleen: Unremarkable. Adrenals/Urinary Tract: Normal adrenal glands. Ureteral stent in the right ureter. Interval removal of the right nephrostomy tube with new dilation of the right renal pelvis. No urinary calculi. Unremarkable bladder. Stomach/Bowel: Normal caliber large and small bowel. Colonic diverticulosis without diverticulitis. Moderate colonic stool load. The appendix is not visualized. Vascular/Lymphatic: Aortic atherosclerotic calcification. No lymphadenopathy. Reproductive: Unremarkable uterus and ovaries. Other: No free intraperitoneal fluid or air. Musculoskeletal: No acute osseous abnormality. IMPRESSION: 1. Patchy airspace opacities in the bilateral lower lung suspicious for pneumonia. 2. Interval removal of the right nephrostomy tube with new development of dilation of the right pelvis. Recommend correlation for proper functioning of the right ureteral stent. Aortic Atherosclerosis (ICD10-I70.0). Electronically Signed   By: Placido Sou M.D.   On: 03/19/2022 20:27    EKG: My personal interpretation of EKG shows: sinus tachycardia    Assessment/Plan Principal Problem:   Acute cystitis without hematuria Active Problems:   Sepsis secondary to UTI ([place])   Multifocal pneumonia   Acute respiratory failure with hypoxia (HCC)   Bacterial spinal epidural abscess - MRSA   DM2 (diabetes mellitus, type 2) (HCC)   Essential hypertension   Ureteral obstruction, right   S/P lumbar laminectomy   Hyponatremia    Assessment and Plan: * Acute cystitis without hematuria Admit to progressive care bed.  Start IV cefepime.  Send cath urine and culture.  Bacterial spinal epidural abscess - MRSA Was discharged on daptomycin via picc line.  Will need to change over to vanco to cover for potential staph pneumonia.  Acute respiratory failure with hypoxia (HCC) Now on supplemental O2 due to pneumonia.  Change to progressive care bed. Pt is a FULL CODE  Multifocal pneumonia Unclear the etiology of her pneumonia.  Patient's been on daptomycin which does not treat staph pneumonia.  Continue with IV cefepime.  Switch her over to IV vancomycin.  She will need ID consult in the morning.  I will  send a secure chat to Dr. Linus Salmons to see the patient.  Sepsis secondary to UTI [facility]) Fulfills sepsis criteria. Fever of 101.5. WBC 14. Pyuria and nitrites on UA. Pneumonia on CXR.  S/P lumbar laminectomy Was seen today by neurosurgery in the office.  Ureteral obstruction, right Still has ureteral stent from 02-27-2021. Follows with Dr. Abner Greenspan with [facility].  Essential hypertension Hold losartan since we are starting vanco. Pt does have LE edema now. Will hold norvasc.  DM2 (diabetes mellitus, type 2) (HCC) Hold metformin. Add ssi.  Hyponatremia Possibly due to HCTZ. Will hold.    DVT prophylaxis: SQ Heparin Code Status: Full Code Family Communication: discussed with pt and dtr Melissa Leon at bedside  Disposition Plan: return home  Consults called: ID consulted via secure chat(comer)  Admission status: Inpatient,  progressive   Kristopher Oppenheim, DO [facility] 03/20/2022, 12:09 AM

## 2022-03-20 NOTE — Progress Notes (Signed)
   Worsening respiratory status. Was on 4 L/min. Now on 10 L/min. Will consult PCCM to take a look at her.  Kristopher Oppenheim, DO [facility]

## 2022-03-20 NOTE — Progress Notes (Signed)
Handoff report received from ED nurse, Arlyss Queen. Awaiting for the patient to be transported to the unit.

## 2022-03-20 NOTE — Consult Note (Signed)
NAME:  Melissa Leon, MRN:  [id number], DOB:  06-11-1945, LOS: 1 ADMISSION DATE:  03/19/2022, CONSULTATION DATE:  03/20/22 REFERRING MD:  Bridgett Larsson CHIEF COMPLAINT:  Weakness, Dyspnea   History of Present Illness:  Melissa Leon is a 77 y.o. female who has a PMH as below including recent decline in her health (see below for summary of events).  Dec 2023 admitted with severe hydronephrosis, had PCN placed and discharged home. Jan 2024 found to have infection at PCN entry site, started on bactrim. Feb 2024 PCN removed and after return home, she had worsening weakness and MRI revealed epidural abscess. Mar 01 2022 underwent lumbar laminectomy with cultures growing MRSA. Echo neg for endocarditis. Seen by ID and started on Dapto IV.  On 2/29, she went to neurosurgery for follow up and daughter complained of weakness x 2 days along with polyuria and fever on day of presentation up to 102. She was subsequently told to come to the ED for further evaluation. She complains of generalized weakness and fatigue. No recent fevers/chills/sweats, chest pain, cough, N/V/D, abd pain, myalgias.  In ED, CXR demonstrated multifocal PNA and UA c/w UTI. She was started on Cefepime and Vanc along with fluids. Shortly after admission by [facility], she had increase in her O2 requirements from 4L to 10L. She also had increased WOB at the time. PCCM was subsequently called in consultation.  At the time of my evaluation, I decreased her flow to 8L and she maintained sats of 98-100%. She has very slight increased WOB but is able to speak in full sentences. Daughter states she definitely sounds better and pt subjectively reports feeling improved since her O2 was increased. She apparently was wheezing a fair bit earlier but is currently clear.  Pertinent  Medical History:  has Pyelonephritis; Sepsis secondary to UTI [facility]); Hyponatremia; DM2 (diabetes mellitus, type 2) ([place]); Essential hypertension; Ureteral obstruction, right; Spinal  stenosis of lumbar region with neurogenic claudication; S/P lumbar laminectomy; Acute cystitis without hematuria; Multifocal pneumonia; Acute respiratory failure with hypoxia ([place]); and Bacterial spinal epidural abscess - MRSA on their problem list.  Significant Hospital Events: Including procedures, antibiotic start and stop dates in addition to other pertinent events   3/1 admit.  Interim History / Subjective:  On 8L O2. Able to speak in full sentences.  Objective:  Blood pressure (!) 157/78, pulse (!) 113, temperature 99.6 F (37.6 C), resp. rate (!) 26, height '5\' 7"'$  (1.702 m), weight 73 kg, SpO2 93 %.        Intake/Output Summary (Last 24 hours) at 03/20/2022 0038 Last data filed at 03/19/2022 2328 Gross per 24 hour  Intake 1099 ml  Output --  Net 1099 ml   Filed Weights   03/19/22 1856  Weight: 73 kg    Examination: General: Adult female, resting in bed, in NAD. Neuro: A&O x 3, no deficits but hard to move around in bed much given recent spinal surgery. HEENT: [place]/AT. Sclerae anicteric. EOMI. Cardiovascular: RRR, no M/R/G.  Lungs: Respirations even and unlabored.  Coarse bilaterally R > L. Abdomen: BS x 4, soft, NT/ND.  Musculoskeletal: No gross deformities, no edema.  Skin: Intact, warm, no rashes.   Labs/imaging personally reviewed:  CXR 3/1 > multifocal PNA. CT abd/pelv 3/1 > bilateral lung opacities. Dilatation of right renal pelvis after stent placement.  Assessment & Plan:   Sepsis - 2/2 multifocal PNA and UTI. - Agree with broad spectrum abx, Vanc/Cefepime. - Follow cultures.  Acute hypoxic respiratory failure -  2/2 multifocal PNA. - Continue supplemental O2 as needed to maintain SpO2 > 92%. - OK for admission to progressive. - Can try BiPAP if has increased WOB overnight/while sleeping. Anticipate will be short term if so. - Abx as above. - Bronchial hygiene. - Mobilize as able.  Recent spinal abscess with MRSA and s/p recent lumbar laminectomy - was  on IV dapto as outpatient. - ID to be consulted in AM given change in abx.  Right hydronephrosis and ureteral obstruction - s/p stent placement. - F/u with Urology.   Rest per primary team. PCCM available as needed, please call if we can be of further assistance.   Best practice (evaluated daily):  Per primary  Labs   CBC: Recent Labs  Lab 03/19/22 2219  WBC 14.6*  NEUTROABS 11.8*  HGB 9.6*  HCT 29.2*  MCV 90.7  PLT 123XX123    Basic Metabolic Panel: Recent Labs  Lab 03/19/22 1951  NA 130*  K 3.7  CL 95*  CO2 24  GLUCOSE 100*  BUN 21  CREATININE 1.04*  CALCIUM 8.6*   GFR: Estimated Creatinine Clearance: 44.8 mL/min (A) (by C-G formula based on SCr of 1.04 mg/dL (H)). Recent Labs  Lab 03/19/22 1951 03/19/22 2219  WBC  --  14.6*  LATICACIDVEN 1.9 1.4    Liver Function Tests: Recent Labs  Lab 03/19/22 1951  AST 30  ALT 25  ALKPHOS 113  BILITOT 1.2  PROT 6.9  ALBUMIN 3.0*   No results for input(s): "LIPASE", "AMYLASE" in the last 168 hours. No results for input(s): "AMMONIA" in the last 168 hours.  ABG No results found for: "PHART", "PCO2ART", "PO2ART", "HCO3", "TCO2", "ACIDBASEDEF", "O2SAT"   Coagulation Profile: No results for input(s): "INR", "PROTIME" in the last 168 hours.  Cardiac Enzymes: No results for input(s): "CKTOTAL", "CKMB", "CKMBINDEX", "TROPONINI" in the last 168 hours.  HbA1C: Hgb A1c MFr Bld  Date/Time Value Ref Range Status  01/04/2022 12:57 PM 5.6 4.8 - 5.6 % Final    Comment:    (NOTE)         Prediabetes: 5.7 - 6.4         Diabetes: >6.4         Glycemic control for adults with diabetes: <7.0     CBG: No results for input(s): "GLUCAP" in the last 168 hours.  Review of Systems:   All negative; except for those that are bolded, which indicate positives.  Constitutional: weight loss, weight gain, night sweats, fevers, chills, fatigue, weakness.  HEENT: headaches, sore throat, sneezing, nasal congestion, post nasal  drip, difficulty swallowing, tooth/dental problems, visual complaints, visual changes, ear aches. Neuro: difficulty with speech, weakness, numbness, ataxia. CV:  chest pain, orthopnea, PND, swelling in lower extremities, dizziness, palpitations, syncope.  Resp: cough, hemoptysis, dyspnea, wheezing. GI: heartburn, indigestion, abdominal pain, nausea, vomiting, diarrhea, constipation, change in bowel habits, loss of appetite, hematemesis, melena, hematochezia.  GU: dysuria, change in color of urine, urgency or frequency, flank pain, hematuria. MSK: joint pain or swelling, decreased range of motion. Psych: change in mood or affect, depression, anxiety, suicidal ideations, homicidal ideations. Skin: rash, itching, bruising.   Past Medical History:  She,  has a past medical history of Anemia, Arthritis, Depression, Diabetes mellitus ([place]), Hypertension, Pneumonia, and Spinal stenosis.   Surgical History:   Past Surgical History:  Procedure Laterality Date   APPENDECTOMY     CHOLECYSTECTOMY     CYSTOSCOPY WITH RETROGRADE PYELOGRAM, URETEROSCOPY AND STENT PLACEMENT Right 02/27/2022   Procedure: TURBT,  CYSTOSCOPY WITH RIGHT RETROGRADE PYELOGRAM, RIGHT URETEROSCOPY, BIOPSY AND STENT PLACEMENT;  Surgeon: Janith Lima, MD;  Location: [facility];  Service: Urology;  Laterality: Right;  30 MINUTES NEEDED FOR CASE   IR NEPHROSTOMY PLACEMENT RIGHT  01/05/2022   LUMBAR LAMINECTOMY FOR EPIDURAL ABSCESS N/A 03/01/2022   Procedure: LUMBAR LAMINECTOMY LUMBAR THREE-FOUR, LUMBAR FOUR- FIVE FOR EPIDURAL ABSCESS;  Surgeon: Eustace Moore, MD;  Location: [facility];  Service: Neurosurgery;  Laterality: N/A;   UTERINE FIBROID SURGERY     WRIST GANGLION EXCISION Right      Social History:   reports that she has quit smoking. Her smoking use included cigarettes. She has a 0.50 pack-year smoking history. She has never used smokeless tobacco. She reports that she does not currently use alcohol. She reports that she does not  use drugs.   Family History:  Her family history is negative for Breast cancer.   Allergies Allergies  Allergen Reactions   Choline Fenofibrate Other (See Comments)    ELEVATED LFT(s)   Penicillins Hives   Ozempic (0.25 Or 0.5 Mg-Dose) [Semaglutide(0.25 Or 0.'5mg'$ -Dos)] Nausea Only and Other (See Comments)    SEVERE NAUSEA   Statins     Aches, pain     Home Medications  Prior to Admission medications   Medication Sig Start Date End Date Taking? Authorizing Provider  acetaminophen (TYLENOL) 650 MG CR tablet Take 650 mg by mouth every 8 (eight) hours as needed for pain.    [provider]  amLODipine (NORVASC) 5 MG tablet Take 5 mg by mouth at bedtime. 03/27/21   [provider]  aspirin 81 MG chewable tablet Chew 81 mg by mouth at bedtime.    [provider]  daptomycin (CUBICIN) IVPB Inject 650 mg into the vein daily. Indication:  MRSA epidural abscess First Dose: Yes Last Day of Therapy:  04/12/22 Labs - Once weekly:  CBC/D, BMP, and CPK Labs - Every other week:  ESR and CRP Method of administration: IV Push Pull PICC line at the completion of IV antibiotics Method of administration may be changed at the discretion of home infusion pharmacist based upon assessment of the patient and/or caregiver's ability to self-administer the medication ordered. 03/06/22 04/14/22  Bonnielee Haff, MD  diclofenac Sodium (VOLTAREN) 1 % GEL Apply 4 g topically 4 (four) times daily. 02/25/22   Deno Etienne, DO  docusate sodium (COLACE) 100 MG capsule Take 1 capsule (100 mg total) by mouth daily as needed for up to 30 doses. 02/27/22   Janith Lima, MD  glimepiride (AMARYL) 4 MG tablet Take 4 mg by mouth daily. 01/28/22   [provider]  hydrochlorothiazide (HYDRODIURIL) 25 MG tablet Take 25 mg by mouth every morning. 01/15/22   [provider]  losartan (COZAAR) 100 MG tablet Take 100 mg by mouth in the morning. 04/21/21   [provider]  metFORMIN  (GLUCOPHAGE-XR) 500 MG 24 hr tablet Take 1,000 mg by mouth 2 (two) times daily with a meal. 03/27/21   [provider]  oxyCODONE-acetaminophen (PERCOCET) 5-325 MG tablet Take 1 tablet by mouth every 4 (four) hours as needed for severe pain. 03/06/22   Bonnielee Haff, MD  polyethylene glycol powder (GLYCOLAX/MIRALAX) 17 GM/SCOOP powder Dissolve 1 capful(17 g) in water and drink by mouth 2 (two) times daily. 03/06/22   Bonnielee Haff, MD  senna (SENOKOT) 8.6 MG TABS tablet Take 1 tablet (8.6 mg total) by mouth 2 (two) times daily. 03/06/22   Bonnielee Haff, MD  [facility], [facility] For pager details, please see AMION or use Epic chat  After 1900, please call [facility] for cross coverage needs 03/20/2022, 12:38 AM

## 2022-03-21 ENCOUNTER — Inpatient Hospital Stay (HOSPITAL_COMMUNITY): Payer: Medicare HMO

## 2022-03-21 DIAGNOSIS — G061 Intraspinal abscess and granuloma: Secondary | ICD-10-CM | POA: Diagnosis not present

## 2022-03-21 DIAGNOSIS — J189 Pneumonia, unspecified organism: Secondary | ICD-10-CM | POA: Diagnosis not present

## 2022-03-21 DIAGNOSIS — J9601 Acute respiratory failure with hypoxia: Secondary | ICD-10-CM | POA: Diagnosis not present

## 2022-03-21 DIAGNOSIS — I10 Essential (primary) hypertension: Secondary | ICD-10-CM | POA: Diagnosis not present

## 2022-03-21 LAB — CBC
HCT: 25.3 % — ABNORMAL LOW (ref 36.0–46.0)
Hemoglobin: 8.7 g/dL — ABNORMAL LOW (ref 12.0–15.0)
MCH: 30.2 pg (ref 26.0–34.0)
MCHC: 34.4 g/dL (ref 30.0–36.0)
MCV: 87.8 fL (ref 80.0–100.0)
Platelets: 271 10*3/uL (ref 150–400)
RBC: 2.88 MIL/uL — ABNORMAL LOW (ref 3.87–5.11)
RDW: 14.2 % (ref 11.5–15.5)
WBC: 14.3 10*3/uL — ABNORMAL HIGH (ref 4.0–10.5)
nRBC: 0 % (ref 0.0–0.2)

## 2022-03-21 LAB — BASIC METABOLIC PANEL
Anion gap: 9 (ref 5–15)
BUN: 20 mg/dL (ref 8–23)
CO2: 24 mmol/L (ref 22–32)
Calcium: 8.1 mg/dL — ABNORMAL LOW (ref 8.9–10.3)
Chloride: 98 mmol/L (ref 98–111)
Creatinine, Ser: 0.96 mg/dL (ref 0.44–1.00)
GFR, Estimated: >60 mL/min (ref >60)
Glucose, Bld: 157 mg/dL — ABNORMAL HIGH (ref 70–99)
Potassium: 3.5 mmol/L (ref 3.5–5.1)
Sodium: 131 mmol/L — ABNORMAL LOW (ref 135–145)

## 2022-03-21 LAB — URINE CULTURE: Culture: 10000 — AB

## 2022-03-21 LAB — GLUCOSE, CAPILLARY
Glucose-Capillary: 130 mg/dL — ABNORMAL HIGH (ref 70–99)
Glucose-Capillary: 110 mg/dL — ABNORMAL HIGH (ref 70–99)
Glucose-Capillary: 109 mg/dL — ABNORMAL HIGH (ref 70–99)
Glucose-Capillary: 135 mg/dL — ABNORMAL HIGH (ref 70–99)

## 2022-03-21 MED ORDER — FUROSEMIDE 10 MG/ML IJ SOLN
20.0000 mg | Freq: Once | INTRAMUSCULAR | Status: AC
  Administered 2022-03-21: 20 mg via INTRAVENOUS
  Filled 2022-03-21: qty 4

## 2022-03-21 NOTE — Progress Notes (Signed)
Dr. Marlowe Sax was notified of patient being c/o SOB, O2 saturation dropping in lower 88 using 2 lts of O2. RT here administering PRN nebulizer treatment. Will continue to monitor.

## 2022-03-21 NOTE — Plan of Care (Signed)
  Problem: Education: Goal: Ability to verbalize activity precautions or restrictions will improve Outcome: Progressing   Problem: Activity: Goal: Ability to avoid complications of mobility impairment will improve Outcome: Progressing Goal: Ability to tolerate increased activity will improve Outcome: Progressing   Problem: Pain Management: Goal: Pain level will decrease Outcome: Progressing   Problem: Education: Goal: Knowledge of General Education information will improve Description: Including pain rating scale, medication(s)/side effects and non-pharmacologic comfort measures Outcome: Progressing   Problem: Activity: Goal: Risk for activity intolerance will decrease Outcome: Progressing   Problem: Nutrition: Goal: Adequate nutrition will be maintained Outcome: Progressing   Problem: Fluid Volume: Goal: Ability to maintain a balanced intake and output will improve Outcome: Progressing   Problem: Skin Integrity: Goal: Risk for impaired skin integrity will decrease Outcome: Progressing

## 2022-03-21 NOTE — Progress Notes (Signed)
Patient states she is feeling "better" after PRN nebulizer treatment was administered and O2 level was increased by RT. Will continue to monitor.

## 2022-03-21 NOTE — Progress Notes (Signed)
PROGRESS NOTE    Melissa Leon  [id number] DOB: Apr 10, 1945 DOA: 03/19/2022 PCP: Katherina Mires, MD   Brief Narrative: Melissa Leon is a 77 y.o. female with a history diabetes mellitus and hypertension. Patient recently diagnosed with epidural abscess currently on daptomycin. Patient presented secondary to fever and weakness and found to have evidence of pneumonia with associated respiratory failure and UTI. Patient started on supplemental oxygen and empiric antibiotics. Cultures obtained and pending.   Assessment and Plan:  Community acquired pneumonia Complicated by history of prior spinal epidural abscess, on daptomycin prior to admission. Patient started empirically on Vancomycin, azithromycin and Cefepime. Azithromycin discontinued per ID recommendations. Leukocytosis persistent. -Continue Vancomycin, Cefepime -Continued ID recommendations  Acute respiratory failure with hypoxia Secondary to above. Patient requiring as much as 10 L/min of oxygen with associated dyspnea; weaned down to 2 L/min. Recurrent dyspnea overnight -Wean to room air as able -Check chest x-ray -Ambulatory pulse ox prior to discharge -Lasix 20 mg IV x1  Acute cystitis without hematuria Patient with frequency. No dysuria. Urinalysis suggests possible UTI. Complicated by present of ureteral stent. Urine culture significant for 10,000 colonies/mL of yeast.  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 Patient follows with neurosurgery and ID and is s/p PICC on Daptomycin. Daptomycin transitioned to Vancomycin this admission for coverage of pneumonia.  Sepsis Present on admission. Secondary to CAP but complicated by concern for UTI and history of MRSA spinal epidural abscess. ID is consulted. Blood cultures no growth to date. Urine culture significant for yeast.  History of right ureteral obstruction Patient is s/p right ureteral stent after recent right nephrostomy tube removal. CT  abdomen significant for dilation of right pelvis. Discussed with Dr. Alinda Money who recommends continued treatment of UTI and to re-consult if patient develops associated AKI.  Primary hypertension Noted.   Diabetes mellitus type 2 Controlled. Hemoglobin A1C of 5.6%. -SSI  Hyponatremia Mild. Stable.   DVT prophylaxis: Heparin subq Code Status:   Code Status: Full Code Family Communication: Daughter at bedside Disposition Plan: Discharge home likely in 2-4 days pending continued infection management and specialist recommendations, ability to wean oxygen, clinical improvement   Consultants:  PCCM Infectious disease  Procedures:  None  Antimicrobials: Vancomycin Cefepime Azithromycin    Subjective: Episode of significant dyspnea overnight. Patient reports needing to sit up and took off her socks secondary to feeling uncomfortable.  Objective: BP (!) 109/59 (BP Location: Left Arm)   Pulse 93   Temp 98.6 F (37 C) (Oral)   Resp 18   Ht '5\' 7"'$  (1.702 m)   Wt 73 kg   SpO2 97%   BMI 25.22 kg/m   Examination:  General exam: Appears calm and comfortable Respiratory system: Clear to auscultation. Respiratory effort normal. Cardiovascular system: S1 & S2 heard, RRR. No murmurs. Gastrointestinal system: Abdomen is nondistended, soft and nontender. Normal bowel sounds heard. Central nervous system: Alert and oriented. No focal neurological deficits. Musculoskeletal: No edema. No calf tenderness Skin: No cyanosis. No rashes Psychiatry: Judgement and insight appear normal. Mood & affect appropriate.    Data Reviewed: I have personally reviewed following labs and imaging studies  CBC Lab Results  Component Value Date   WBC 14.3 (H) 03/21/2022   RBC 2.88 (L) 03/21/2022   HGB 8.7 (L) 03/21/2022   HCT 25.3 (L) 03/21/2022   MCV 87.8 03/21/2022   MCH 30.2 03/21/2022   PLT 271 03/21/2022   MCHC 34.4 03/21/2022   RDW 14.2 03/21/2022   LYMPHSABS  0.9 03/20/2022   MONOABS  0.6 03/20/2022   EOSABS 0.2 03/20/2022   BASOSABS 0.1 [id number]     Last metabolic panel Lab Results  Component Value Date   NA 131 (L) 03/21/2022   K 3.5 03/21/2022   CL 98 03/21/2022   CO2 24 03/21/2022   BUN 20 03/21/2022   CREATININE 0.96 03/21/2022   GLUCOSE 157 (H) 03/21/2022   GFRNONAA >60 03/21/2022   CALCIUM 8.1 (L) 03/21/2022   PHOS 3.8 03/06/2022   PROT 5.7 (L) 03/20/2022   ALBUMIN 2.3 (L) 03/20/2022   BILITOT 1.4 (H) 03/20/2022   ALKPHOS 93 03/20/2022   AST 27 03/20/2022   ALT 21 03/20/2022   ANIONGAP 9 03/21/2022    GFR: Estimated Creatinine Clearance: 48.5 mL/min (by C-G formula based on SCr of 0.96 mg/dL).  Recent Results (from the past 240 hour(s))  Blood culture (routine x 2)     Status: None (Preliminary result)   Collection Time: 03/19/22  7:35 PM   Specimen: BLOOD RIGHT HAND  Result Value Ref Range Status   Specimen Description BLOOD RIGHT HAND  Final   Special Requests   Final    BOTTLES DRAWN AEROBIC ONLY Blood Culture adequate volume   Culture   Final    NO GROWTH 2 DAYS Performed at [facility], [address] [place], [place] [address]    Report Status PENDING  Incomplete  Blood culture (routine x 2)     Status: None (Preliminary result)   Collection Time: 03/19/22  7:48 PM   Specimen: BLOOD RIGHT ARM  Result Value Ref Range Status   Specimen Description BLOOD RIGHT ARM  Final   Special Requests   Final    BOTTLES DRAWN AEROBIC AND ANAEROBIC Blood Culture adequate volume   Culture   Final    NO GROWTH 2 DAYS Performed at [facility], [place] [address], [place], [place] [address]    Report Status PENDING  Incomplete  Resp panel by RT-PCR (RSV, Flu A&B, Covid) Anterior Nasal Swab     Status: None   Collection Time: 03/19/22 10:25 PM   Specimen: Anterior Nasal Swab  Result Value Ref Range Status   SARS Coronavirus 2 by RT PCR NEGATIVE NEGATIVE Final   Influenza A by PCR NEGATIVE NEGATIVE Final   Influenza B by PCR  NEGATIVE NEGATIVE Final    Comment: (NOTE) The Xpert Xpress SARS-CoV-2/FLU/RSV plus assay is intended as an aid in the diagnosis of influenza from Nasopharyngeal swab specimens and should not be used as a sole basis for treatment. Nasal washings and aspirates are unacceptable for Xpert Xpress SARS-CoV-2/FLU/RSV testing.  Fact Sheet for Patients: EntrepreneurPulse.com.au  Fact Sheet for Healthcare Providers: IncredibleEmployment.be  This test is not yet approved or cleared by the [place] FDA and has been authorized for detection and/or diagnosis of SARS-CoV-2 by FDA under an Emergency Use Authorization (EUA). This EUA will remain in effect (meaning this test can be used) for the duration of the COVID-19 declaration under Section 564(b)(1) of the Act, 21 U.S.C. section 360bbb-3(b)(1), unless the authorization is terminated or revoked.     Resp Syncytial Virus by PCR NEGATIVE NEGATIVE Final    Comment: (NOTE) Fact Sheet for Patients: EntrepreneurPulse.com.au  Fact Sheet for Healthcare Providers: IncredibleEmployment.be  This test is not yet approved or cleared by the [place] FDA and has been authorized for detection and/or diagnosis of SARS-CoV-2 by FDA under an Emergency Use Authorization (EUA). This EUA will remain in  effect (meaning this test can be used) for the duration of the COVID-19 declaration under Section 564(b)(1) of the Act, 21 U.S.C. section 360bbb-3(b)(1), unless the authorization is terminated or revoked.  Performed at [facility], [place] [address], [place], [place] [address]   Urine Culture     Status: Abnormal   Collection Time: 03/20/22 12:45 AM   Specimen: Urine, Random  Result Value Ref Range Status   Specimen Description URINE, RANDOM  Final   Special Requests   Final    NONE Reflexed from [id number] Performed at [facility], [place] [address], [place],  [place] [address]    Culture 10,000 COLONIES/mL YEAST (A)  Final   Report Status 03/21/2022 FINAL  Final  MRSA Next Gen by PCR, Nasal     Status: None   Collection Time: 03/20/22  8:15 AM   Specimen: Nasal Mucosa; Nasal Swab  Result Value Ref Range Status   MRSA by PCR Next Gen NOT DETECTED NOT DETECTED Final    Comment: (NOTE) The GeneXpert MRSA Assay (FDA approved for NASAL specimens only), is one component of a comprehensive MRSA colonization surveillance program. It is not intended to diagnose MRSA infection nor to guide or monitor treatment for MRSA infections. Test performance is not FDA approved in patients less than 86 years old. Performed at [facility], [place] [address] [place], [place] [address]   Respiratory (~20 pathogens) panel by PCR     Status: None   Collection Time: 03/20/22  8:16 AM   Specimen: Nasopharyngeal Swab; Respiratory  Result Value Ref Range Status   Adenovirus NOT DETECTED NOT DETECTED Final   Coronavirus 229E NOT DETECTED NOT DETECTED Final    Comment: (NOTE) The Coronavirus on the Respiratory Panel, DOES NOT test for the novel  Coronavirus (2019 nCoV)    Coronavirus HKU1 NOT DETECTED NOT DETECTED Final   Coronavirus NL63 NOT DETECTED NOT DETECTED Final   Coronavirus OC43 NOT DETECTED NOT DETECTED Final   Metapneumovirus NOT DETECTED NOT DETECTED Final   Rhinovirus / Enterovirus NOT DETECTED NOT DETECTED Final   Influenza A NOT DETECTED NOT DETECTED Final   Influenza B NOT DETECTED NOT DETECTED Final   Parainfluenza Virus 1 NOT DETECTED NOT DETECTED Final   Parainfluenza Virus 2 NOT DETECTED NOT DETECTED Final   Parainfluenza Virus 3 NOT DETECTED NOT DETECTED Final   Parainfluenza Virus 4 NOT DETECTED NOT DETECTED Final   Respiratory Syncytial Virus NOT DETECTED NOT DETECTED Final   Bordetella pertussis NOT DETECTED NOT DETECTED Final   Bordetella Parapertussis NOT DETECTED NOT DETECTED Final   Chlamydophila pneumoniae NOT DETECTED NOT DETECTED  Final   Mycoplasma pneumoniae NOT DETECTED NOT DETECTED Final    Comment: Performed at [facility], [place], [place],  [address]      Radiology Studies: DG Chest 1 View  Result Date: 03/19/2022 CLINICAL DATA:  Weakness EXAM: CHEST  1 VIEW COMPARISON:  Chest x-ray 12/10/2018 FINDINGS: There is a moderate amount of patchy airspace consolidation in the left mid and upper lung. There is also faint patchy airspace disease in the right lung base. There is no pleural effusion or pneumothorax. Cardiomediastinal silhouette is within normal limits. Right-sided central venous catheter tip projects over the SVC. No acute fractures are seen. IMPRESSION: Bilateral airspace disease, left greater than right. Findings are concerning for multifocal pneumonia. Follow-up PA and lateral chest x-ray recommended in 4-6 weeks to confirm resolution. Electronically Signed   By: Warren Lacy  Dagoberto Reef M.D.   On: 03/19/2022 20:41   CT ABDOMEN PELVIS WO CONTRAST  Result Date: 03/19/2022 CLINICAL DATA:  Recent procedures of nephrostomy tube insertion and removal and removal of spinal abscess. Presenting with fever and weakness. Currently receiving daptomycin for infection. EXAM: CT ABDOMEN AND PELVIS WITHOUT CONTRAST TECHNIQUE: Multidetector CT imaging of the abdomen and pelvis was performed following the standard protocol without IV contrast. RADIATION DOSE REDUCTION: This exam was performed according to the departmental dose-optimization program which includes automated exposure control, adjustment of the mA and/or kV according to patient size and/or use of iterative reconstruction technique. COMPARISON:  CT 02/25/2022 FINDINGS: Lower chest: Patchy airspace opacities in the bilateral lower lung suspicious for pneumonia. Hepatobiliary: Unremarkable liver and biliary tree. Cholecystectomy. Pancreas: Unremarkable. Spleen: Unremarkable. Adrenals/Urinary Tract: Normal adrenal glands. Ureteral stent in the right ureter.  Interval removal of the right nephrostomy tube with new dilation of the right renal pelvis. No urinary calculi. Unremarkable bladder. Stomach/Bowel: Normal caliber large and small bowel. Colonic diverticulosis without diverticulitis. Moderate colonic stool load. The appendix is not visualized. Vascular/Lymphatic: Aortic atherosclerotic calcification. No lymphadenopathy. Reproductive: Unremarkable uterus and ovaries. Other: No free intraperitoneal fluid or air. Musculoskeletal: No acute osseous abnormality. IMPRESSION: 1. Patchy airspace opacities in the bilateral lower lung suspicious for pneumonia. 2. Interval removal of the right nephrostomy tube with new development of dilation of the right pelvis. Recommend correlation for proper functioning of the right ureteral stent. Aortic Atherosclerosis (ICD10-I70.0). Electronically Signed   By: Placido Sou M.D.   On: 03/19/2022 20:27      LOS: 2 days    Cordelia Poche, MD [facility] 03/21/2022, 10:46 AM   If 7PM-7AM, please contact night-coverage [email or web address]

## 2022-03-21 NOTE — Progress Notes (Signed)
Patient c/o SOB and respiratory wheezes. RT was called so they can administer PRN nebulizer treatment to this patient.

## 2022-03-22 ENCOUNTER — Inpatient Hospital Stay (HOSPITAL_COMMUNITY): Payer: Medicare HMO

## 2022-03-22 DIAGNOSIS — I10 Essential (primary) hypertension: Secondary | ICD-10-CM | POA: Diagnosis not present

## 2022-03-22 DIAGNOSIS — J189 Pneumonia, unspecified organism: Secondary | ICD-10-CM | POA: Diagnosis not present

## 2022-03-22 DIAGNOSIS — G061 Intraspinal abscess and granuloma: Secondary | ICD-10-CM | POA: Diagnosis not present

## 2022-03-22 DIAGNOSIS — J9601 Acute respiratory failure with hypoxia: Secondary | ICD-10-CM | POA: Diagnosis not present

## 2022-03-22 LAB — CBC WITH DIFFERENTIAL/PLATELET
Abs Immature Granulocytes: 0.06 10*3/uL (ref 0.00–0.07)
Basophils Absolute: 0 10*3/uL (ref 0.0–0.1)
Basophils Relative: 0 %
Eosinophils Absolute: 0.7 10*3/uL — ABNORMAL HIGH (ref 0.0–0.5)
Eosinophils Relative: 5 %
HCT: 25 % — ABNORMAL LOW (ref 36.0–46.0)
Hemoglobin: 8.5 g/dL — ABNORMAL LOW (ref 12.0–15.0)
Immature Granulocytes: 1 %
Lymphocytes Relative: 5 %
Lymphs Abs: 0.6 10*3/uL — ABNORMAL LOW (ref 0.7–4.0)
MCH: 29.8 pg (ref 26.0–34.0)
MCHC: 34 g/dL (ref 30.0–36.0)
MCV: 87.7 fL (ref 80.0–100.0)
Monocytes Absolute: 0.6 10*3/uL (ref 0.1–1.0)
Monocytes Relative: 4 %
Neutro Abs: 11.4 10*3/uL — ABNORMAL HIGH (ref 1.7–7.7)
Neutrophils Relative %: 85 %
Platelets: 269 10*3/uL (ref 150–400)
RBC: 2.85 MIL/uL — ABNORMAL LOW (ref 3.87–5.11)
RDW: 14.1 % (ref 11.5–15.5)
WBC: 13.3 10*3/uL — ABNORMAL HIGH (ref 4.0–10.5)
nRBC: 0 % (ref 0.0–0.2)

## 2022-03-22 LAB — COMPREHENSIVE METABOLIC PANEL
ALT: 29 U/L (ref 0–44)
AST: 33 U/L (ref 15–41)
Albumin: 1.9 g/dL — ABNORMAL LOW (ref 3.5–5.0)
Alkaline Phosphatase: 101 U/L (ref 38–126)
Anion gap: 10 (ref 5–15)
BUN: 19 mg/dL (ref 8–23)
CO2: 25 mmol/L (ref 22–32)
Calcium: 8.2 mg/dL — ABNORMAL LOW (ref 8.9–10.3)
Chloride: 97 mmol/L — ABNORMAL LOW (ref 98–111)
Creatinine, Ser: 0.83 mg/dL (ref 0.44–1.00)
GFR, Estimated: >60 mL/min (ref >60)
Glucose, Bld: 129 mg/dL — ABNORMAL HIGH (ref 70–99)
Potassium: 3.3 mmol/L — ABNORMAL LOW (ref 3.5–5.1)
Sodium: 132 mmol/L — ABNORMAL LOW (ref 135–145)
Total Bilirubin: 1.2 mg/dL (ref 0.3–1.2)
Total Protein: 5.8 g/dL — ABNORMAL LOW (ref 6.5–8.1)

## 2022-03-22 LAB — GLUCOSE, CAPILLARY
Glucose-Capillary: 121 mg/dL — ABNORMAL HIGH (ref 70–99)
Glucose-Capillary: 162 mg/dL — ABNORMAL HIGH (ref 70–99)
Glucose-Capillary: 113 mg/dL — ABNORMAL HIGH (ref 70–99)
Glucose-Capillary: 238 mg/dL — ABNORMAL HIGH (ref 70–99)

## 2022-03-22 LAB — BLOOD GAS, ARTERIAL
Acid-Base Excess: 1.3 mmol/L (ref 0.0–2.0)
Bicarbonate: 24.6 mmol/L (ref 20.0–28.0)
Drawn by: 53547
O2 Saturation: 95.6 %
Patient temperature: 37
pCO2 arterial: 33 mmHg (ref 32–48)
pH, Arterial: 7.48 — ABNORMAL HIGH (ref 7.35–7.45)
pO2, Arterial: 69 mmHg — ABNORMAL LOW (ref 83–108)

## 2022-03-22 MED ORDER — VANCOMYCIN HCL 1500 MG/300ML IV SOLN
1500.0000 mg | INTRAVENOUS | Status: DC
  Administered 2022-03-22 – 2022-03-24 (×3): 1500 mg via INTRAVENOUS
  Filled 2022-03-22 (×3): qty 300

## 2022-03-22 MED ORDER — FLAVOXATE HCL 100 MG PO TABS
100.0000 mg | ORAL_TABLET | Freq: Three times a day (TID) | ORAL | Status: DC | PRN
  Administered 2022-03-23: 100 mg via ORAL
  Filled 2022-03-22 (×2): qty 1

## 2022-03-22 MED ORDER — POTASSIUM CHLORIDE CRYS ER 20 MEQ PO TBCR
40.0000 meq | EXTENDED_RELEASE_TABLET | Freq: Once | ORAL | Status: AC
  Administered 2022-03-22: 40 meq via ORAL
  Filled 2022-03-22: qty 2

## 2022-03-22 MED ORDER — FUROSEMIDE 10 MG/ML IJ SOLN
20.0000 mg | Freq: Once | INTRAMUSCULAR | Status: AC
  Administered 2022-03-22: 20 mg via INTRAVENOUS
  Filled 2022-03-22: qty 4

## 2022-03-22 MED ORDER — SODIUM CHLORIDE 0.9 % IV SOLN
2.0000 g | Freq: Three times a day (TID) | INTRAVENOUS | Status: DC
  Administered 2022-03-22 – 2022-03-23 (×3): 2 g via INTRAVENOUS
  Filled 2022-03-22 (×3): qty 12.5

## 2022-03-22 MED ORDER — ALBUTEROL SULFATE (2.5 MG/3ML) 0.083% IN NEBU
2.5000 mg | INHALATION_SOLUTION | Freq: Four times a day (QID) | RESPIRATORY_TRACT | Status: DC
  Administered 2022-03-22 – 2022-03-23 (×2): 2.5 mg via RESPIRATORY_TRACT
  Filled 2022-03-22 (×2): qty 3

## 2022-03-22 MED ORDER — METHYLPREDNISOLONE SODIUM SUCC 125 MG IJ SOLR
125.0000 mg | Freq: Once | INTRAMUSCULAR | Status: AC
  Administered 2022-03-22: 125 mg via INTRAVENOUS
  Filled 2022-03-22 (×2): qty 2

## 2022-03-22 NOTE — Progress Notes (Signed)
    [facility] for Infectious Disease   Reason for visit: Follow up on fever  Interval History: urine culture with no significant growth (small amount of yeast) No acute changes.  Fever curve trending down.  WBC stable  Physical Exam: Constitutional:  Vitals:   03/22/22 0414 03/22/22 1154  BP: (!) 117/59 135/71  Pulse: 87 (!) 101  Resp: 18   Temp: 98.2 F (36.8 C) 97.7 F (36.5 C)  SpO2: 99% 99%    Impression: improved but unclear etiology of the fever.  Urine certainly possible but no growth, though urine culture was sent after starting antibiotics.  Plan: 1.  No changes at this time. Dr. Candiss Norse to follow up tomorrow

## 2022-03-22 NOTE — Plan of Care (Signed)
  Problem: Education: Goal: Ability to verbalize activity precautions or restrictions will improve Outcome: Progressing Goal: Knowledge of the prescribed therapeutic regimen will improve Outcome: Progressing Goal: Understanding of discharge needs will improve Outcome: Progressing   Problem: Activity: Goal: Ability to avoid complications of mobility impairment will improve Outcome: Progressing Goal: Ability to tolerate increased activity will improve Outcome: Progressing Goal: Will remain free from falls Outcome: Progressing   Problem: Bowel/Gastric: Goal: Gastrointestinal status for postoperative course will improve Outcome: Progressing   Problem: Clinical Measurements: Goal: Ability to maintain clinical measurements within normal limits will improve Outcome: Progressing Goal: Postoperative complications will be avoided or minimized Outcome: Progressing Goal: Diagnostic test results will improve Outcome: Progressing   Problem: Pain Management: Goal: Pain level will decrease Outcome: Progressing   Problem: Skin Integrity: Goal: Will show signs of wound healing Outcome: Progressing   Problem: Health Behavior/Discharge Planning: Goal: Identification of resources available to assist in meeting health care needs will improve Outcome: Progressing   Problem: Bladder/Genitourinary: Goal: Urinary functional status for postoperative course will improve Outcome: Progressing   Problem: Education: Goal: Knowledge of General Education information will improve Description: Including pain rating scale, medication(s)/side effects and non-pharmacologic comfort measures Outcome: Progressing   Problem: Health Behavior/Discharge Planning: Goal: Ability to manage health-related needs will improve Outcome: Progressing   Problem: Clinical Measurements: Goal: Ability to maintain clinical measurements within normal limits will improve Outcome: Progressing Goal: Will remain free from  infection Outcome: Progressing Goal: Diagnostic test results will improve Outcome: Progressing Goal: Respiratory complications will improve Outcome: Progressing Goal: Cardiovascular complication will be avoided Outcome: Progressing   Problem: Activity: Goal: Risk for activity intolerance will decrease Outcome: Progressing   Problem: Nutrition: Goal: Adequate nutrition will be maintained Outcome: Progressing   Problem: Coping: Goal: Level of anxiety will decrease Outcome: Progressing   Problem: Elimination: Goal: Will not experience complications related to bowel motility Outcome: Progressing Goal: Will not experience complications related to urinary retention Outcome: Progressing   Problem: Pain Managment: Goal: General experience of comfort will improve Outcome: Progressing   Problem: Safety: Goal: Ability to remain free from injury will improve Outcome: Progressing   Problem: Skin Integrity: Goal: Risk for impaired skin integrity will decrease Outcome: Progressing   Problem: Education: Goal: Ability to describe self-care measures that may prevent or decrease complications (Diabetes Survival Skills Education) will improve Outcome: Progressing Goal: Individualized Educational Video(s) Outcome: Progressing   Problem: Coping: Goal: Ability to adjust to condition or change in health will improve Outcome: Progressing   Problem: Fluid Volume: Goal: Ability to maintain a balanced intake and output will improve Outcome: Progressing   Problem: Health Behavior/Discharge Planning: Goal: Ability to identify and utilize available resources and services will improve Outcome: Progressing Goal: Ability to manage health-related needs will improve Outcome: Progressing   Problem: Metabolic: Goal: Ability to maintain appropriate glucose levels will improve Outcome: Progressing   Problem: Nutritional: Goal: Maintenance of adequate nutrition will improve Outcome:  Progressing Goal: Progress toward achieving an optimal weight will improve Outcome: Progressing   Problem: Skin Integrity: Goal: Risk for impaired skin integrity will decrease Outcome: Progressing   Problem: Tissue Perfusion: Goal: Adequacy of tissue perfusion will improve Outcome: Progressing   

## 2022-03-22 NOTE — Progress Notes (Addendum)
Pharmacy Antibiotic Note  Melissa Leon is a 77 y.o. female admitted on 03/19/2022 with  weakness and fever, hx nephrostomy tube, MRSA epidural abscess on daptomycin PTA with ID plan for LOT through 3/24 for 8 weeks tx. Pharmacy initially consulted for daptomycin dosing, but found to have pneumonia. Therapy was changed to vancomycin and cefepime. Currently on cefepime 2g IV q12 hrs and vancomycin '1000mg'$  IV q24 hrs. Pharmacy has been consulted for vancomycin and cefepime dosing.  Plan: Increase vancomycin '1500mg'$  IV q24 hours (eAUC 531.8, Scr 0.83). Increase cefepime 2g IV q8 hours.  Monitor renal function, ID recs, clinical improvement. Once at steady state, consider vanc levels as appropriate.  Height: '5\' 7"'$  (170.2 cm) Weight: 76.9 kg (169 lb 8.5 oz) IBW/kg (Calculated) : 61.6  Temp (24hrs), Avg:98.6 F (37 C), Min:98.2 F (36.8 C), Max:99.4 F (37.4 C)  Recent Labs  Lab 03/19/22 1951 03/19/22 2219 03/20/22 0314 03/20/22 0500 03/21/22 0500 03/22/22 0535  WBC  --  14.6* 14.3* 12.5* 14.3* 13.3*  CREATININE 1.04*  --   --  0.92 0.96 0.83  LATICACIDVEN 1.9 1.4  --   --   --   --     Estimated Creatinine Clearance: 61.6 mL/min (by C-G formula based on SCr of 0.83 mg/dL).    Allergies  Allergen Reactions   Choline Fenofibrate Other (See Comments)    ELEVATED LFT.    Penicillins Hives   Ozempic (0.25 Or 0.5 Mg-Dose) [Semaglutide(0.25 Or 0.'5mg'$ -Dos)] Nausea Only   Statins Other (See Comments)    Aches, pain    Thank you for allowing pharmacy to be a part of this patient's care.  Leticia Clas 03/22/2022 9:28 AM

## 2022-03-22 NOTE — Progress Notes (Signed)
PROGRESS NOTE    Melissa Leon  [id number] DOB: September 20, 1945 DOA: 03/19/2022 PCP: Katherina Mires, MD   Brief Narrative: Melissa Leon is a 77 y.o. female with a history diabetes mellitus and hypertension. Patient recently diagnosed with epidural abscess currently on daptomycin. Patient presented secondary to fever and weakness and found to have evidence of pneumonia with associated respiratory failure and UTI. Patient started on supplemental oxygen and empiric antibiotics. Cultures obtained and are no growth to date.   Assessment and Plan:  Community acquired pneumonia Complicated by history of prior spinal epidural abscess, on daptomycin prior to admission. Patient started empirically on Vancomycin, azithromycin and Cefepime. Azithromycin discontinued per ID recommendations. Leukocytosis persistent. Repeat chest x-ray (3/2) significant for worsening infiltrates. -Continue Vancomycin, Cefepime -Continued ID recommendations: pending today -Repeat chest x-ray in AM  Acute respiratory failure with hypoxia Secondary to above. Patient requiring as much as 10 L/min of oxygen with associated dyspnea; weaned down to 2 L/min. Recurrent dyspnea responding to albuterol but requiring increase back up to 8 L/min of oxygen. Repeat chest x-ray (3/2) with evidence of worsening infiltrate, likely contributing. -Wean to room air as able -Ambulatory pulse ox prior to discharge  Acute cystitis without hematuria Patient with frequency. No dysuria. Urinalysis suggests possible UTI. Complicated by present of ureteral stent. Urine culture significant for 10,000 colonies/mL of yeast.  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 Patient follows with neurosurgery and ID and is s/p PICC on Daptomycin. Daptomycin transitioned to Vancomycin this admission for coverage of pneumonia. Incision site does not appear to be acutely infected. -Will discussed with neurosurgery  Sepsis Present on  admission. Secondary to CAP but complicated by concern for UTI and history of MRSA spinal epidural abscess. ID is consulted. Blood cultures no growth to date. Urine culture significant for yeast.  History of right ureteral obstruction Patient is s/p right ureteral stent after recent right nephrostomy tube removal. CT abdomen significant for dilation of right pelvis. Discussed with Dr. Alinda Money who recommends continued treatment of UTI and to re-consult if patient develops associated AKI.  Primary hypertension Noted.   Diabetes mellitus type 2 Controlled. Hemoglobin A1C of 5.6%. -SSI  Hyponatremia Mild. Stable.   DVT prophylaxis: Heparin subq Code Status:   Code Status: Full Code Family Communication: Daughter at bedside Disposition Plan: Discharge home likely in 2-3 days pending continued infection management and specialist recommendations, ability to wean oxygen, clinical improvement   Consultants:  PCCM Infectious disease  Procedures:  None  Antimicrobials: Vancomycin Cefepime Azithromycin    Subjective: Patient with a good night last night. No significant dyspnea episodes. Breathing is improved. Feeling better. Daughter noticed some drainage from surgical wound in back.  Objective: BP 135/71 (BP Location: Right Arm)   Pulse (!) 101   Temp 97.7 F (36.5 C) (Oral)   Resp 18   Ht '5\' 7"'$  (1.702 m)   Wt 76.9 kg   SpO2 99%   BMI 26.55 kg/m   Examination:  General exam: Appears calm and comfortable Respiratory system: Mild rales. Respiratory effort normal. Cardiovascular system: S1 & S2 heard, RRR. No murmurs, rubs, gallops or clicks. Gastrointestinal system: Abdomen is nondistended, soft and nontender.  Normal bowel sounds heard. Central nervous system: Alert and oriented. No focal neurological deficits. Musculoskeletal: No calf tenderness Skin: No cyanosis. No rashes. Spinal surgical wound without significant erythema. Slightly open area at inferior aspect of  wound with some tan material without evidence of purulence and with some underlying induration Psychiatry: Judgement  and insight appear normal. Mood & affect appropriate.   Data Reviewed: I have personally reviewed following labs and imaging studies  CBC Lab Results  Component Value Date   WBC 13.3 (H) 03/22/2022   RBC 2.85 (L) 03/22/2022   HGB 8.5 (L) 03/22/2022   HCT 25.0 (L) 03/22/2022   MCV 87.7 03/22/2022   MCH 29.8 03/22/2022   PLT 269 03/22/2022   MCHC 34.0 03/22/2022   RDW 14.1 03/22/2022   LYMPHSABS 0.6 (L) 03/22/2022   MONOABS 0.6 03/22/2022   EOSABS 0.7 (H) 03/22/2022   BASOSABS 0.0 [id number]     Last metabolic panel Lab Results  Component Value Date   NA 132 (L) 03/22/2022   K 3.3 (L) 03/22/2022   CL 97 (L) 03/22/2022   CO2 25 03/22/2022   BUN 19 03/22/2022   CREATININE 0.83 03/22/2022   GLUCOSE 129 (H) 03/22/2022   GFRNONAA >60 03/22/2022   CALCIUM 8.2 (L) 03/22/2022   PHOS 3.8 03/06/2022   PROT 5.8 (L) 03/22/2022   ALBUMIN 1.9 (L) 03/22/2022   BILITOT 1.2 03/22/2022   ALKPHOS 101 03/22/2022   AST 33 03/22/2022   ALT 29 03/22/2022   ANIONGAP 10 03/22/2022    GFR: Estimated Creatinine Clearance: 61.6 mL/min (by C-G formula based on SCr of 0.83 mg/dL).  Recent Results (from the past 240 hour(s))  Blood culture (routine x 2)     Status: None (Preliminary result)   Collection Time: 03/19/22  7:35 PM   Specimen: BLOOD RIGHT HAND  Result Value Ref Range Status   Specimen Description BLOOD RIGHT HAND  Final   Special Requests   Final    BOTTLES DRAWN AEROBIC ONLY Blood Culture adequate volume   Culture   Final    NO GROWTH 3 DAYS Performed at [facility], [address] [place], [place] [address]    Report Status PENDING  Incomplete  Blood culture (routine x 2)     Status: None (Preliminary result)   Collection Time: 03/19/22  7:48 PM   Specimen: BLOOD RIGHT ARM  Result Value Ref Range Status   Specimen Description BLOOD RIGHT ARM   Final   Special Requests   Final    BOTTLES DRAWN AEROBIC AND ANAEROBIC Blood Culture adequate volume   Culture   Final    NO GROWTH 3 DAYS Performed at [facility], [place] [address], [place], [place] [address]    Report Status PENDING  Incomplete  Resp panel by RT-PCR (RSV, Flu A&B, Covid) Anterior Nasal Swab     Status: None   Collection Time: 03/19/22 10:25 PM   Specimen: Anterior Nasal Swab  Result Value Ref Range Status   SARS Coronavirus 2 by RT PCR NEGATIVE NEGATIVE Final   Influenza A by PCR NEGATIVE NEGATIVE Final   Influenza B by PCR NEGATIVE NEGATIVE Final    Comment: (NOTE) The Xpert Xpress SARS-CoV-2/FLU/RSV plus assay is intended as an aid in the diagnosis of influenza from Nasopharyngeal swab specimens and should not be used as a sole basis for treatment. Nasal washings and aspirates are unacceptable for Xpert Xpress SARS-CoV-2/FLU/RSV testing.  Fact Sheet for Patients: EntrepreneurPulse.com.au  Fact Sheet for Healthcare Providers: IncredibleEmployment.be  This test is not yet approved or cleared by the [place] FDA and has been authorized for detection and/or diagnosis of SARS-CoV-2 by FDA under an Emergency Use Authorization (EUA). This EUA will remain in effect (meaning this test can be used) for the duration of the COVID-19 declaration  under Section 564(b)(1) of the Act, 21 U.S.C. section 360bbb-3(b)(1), unless the authorization is terminated or revoked.     Resp Syncytial Virus by PCR NEGATIVE NEGATIVE Final    Comment: (NOTE) Fact Sheet for Patients: EntrepreneurPulse.com.au  Fact Sheet for Healthcare Providers: IncredibleEmployment.be  This test is not yet approved or cleared by the [place] FDA and has been authorized for detection and/or diagnosis of SARS-CoV-2 by FDA under an Emergency Use Authorization (EUA). This EUA will remain in effect (meaning this test  can be used) for the duration of the COVID-19 declaration under Section 564(b)(1) of the Act, 21 U.S.C. section 360bbb-3(b)(1), unless the authorization is terminated or revoked.  Performed at [facility], [place] [address], [place], [place] [address]   Urine Culture     Status: Abnormal   Collection Time: 03/20/22 12:45 AM   Specimen: Urine, Random  Result Value Ref Range Status   Specimen Description URINE, RANDOM  Final   Special Requests   Final    NONE Reflexed from [id number] Performed at [facility], [place] [address] [place], [place] [address]    Culture 10,000 COLONIES/mL YEAST (A)  Final   Report Status 03/21/2022 FINAL  Final  MRSA Next Gen by PCR, Nasal     Status: None   Collection Time: 03/20/22  8:15 AM   Specimen: Nasal Mucosa; Nasal Swab  Result Value Ref Range Status   MRSA by PCR Next Gen NOT DETECTED NOT DETECTED Final    Comment: (NOTE) The GeneXpert MRSA Assay (FDA approved for NASAL specimens only), is one component of a comprehensive MRSA colonization surveillance program. It is not intended to diagnose MRSA infection nor to guide or monitor treatment for MRSA infections. Test performance is not FDA approved in patients less than 20 years old. Performed at [facility], [place] [address], [place], [place] [address]   Respiratory (~20 pathogens) panel by PCR     Status: None   Collection Time: 03/20/22  8:16 AM   Specimen: Nasopharyngeal Swab; Respiratory  Result Value Ref Range Status   Adenovirus NOT DETECTED NOT DETECTED Final   Coronavirus 229E NOT DETECTED NOT DETECTED Final    Comment: (NOTE) The Coronavirus on the Respiratory Panel, DOES NOT test for the novel  Coronavirus (2019 nCoV)    Coronavirus HKU1 NOT DETECTED NOT DETECTED Final   Coronavirus NL63 NOT DETECTED NOT DETECTED Final   Coronavirus OC43 NOT DETECTED NOT DETECTED Final   Metapneumovirus NOT DETECTED NOT DETECTED Final   Rhinovirus / Enterovirus NOT DETECTED NOT  DETECTED Final   Influenza A NOT DETECTED NOT DETECTED Final   Influenza B NOT DETECTED NOT DETECTED Final   Parainfluenza Virus 1 NOT DETECTED NOT DETECTED Final   Parainfluenza Virus 2 NOT DETECTED NOT DETECTED Final   Parainfluenza Virus 3 NOT DETECTED NOT DETECTED Final   Parainfluenza Virus 4 NOT DETECTED NOT DETECTED Final   Respiratory Syncytial Virus NOT DETECTED NOT DETECTED Final   Bordetella pertussis NOT DETECTED NOT DETECTED Final   Bordetella Parapertussis NOT DETECTED NOT DETECTED Final   Chlamydophila pneumoniae NOT DETECTED NOT DETECTED Final   Mycoplasma pneumoniae NOT DETECTED NOT DETECTED Final    Comment: Performed at [facility], [place]. [address], [place],  [address]      Radiology Studies: DG CHEST PORT 1 VIEW  Result Date: 03/21/2022 CLINICAL DATA:  Hypoxia EXAM: PORTABLE CHEST 1 VIEW COMPARISON:  Chest x-ray 03/19/2022 FINDINGS: Increasing bilateral parenchymal opacities with  increasing confluence area left upper lobe and developing new areas scattered throughout the right lung. Recommend follow-up. No pneumothorax, effusion or edema. Normal cardiopericardial silhouette. Calcified aorta. Right-sided PIC catheter in place with tip overlying the central SVC. Degenerative changes of the spine. Overlapping cardiac leads. IMPRESSION: Increasing bilateral consolidative infiltrative opacities. Recommend follow-up Electronically Signed   By: Jill Side M.D.   On: 03/21/2022 10:55      LOS: 3 days    Cordelia Poche, MD [facility] 03/22/2022, 12:23 PM   If 7PM-7AM, please contact night-coverage [email or web address]

## 2022-03-23 DIAGNOSIS — G061 Intraspinal abscess and granuloma: Secondary | ICD-10-CM | POA: Diagnosis not present

## 2022-03-23 DIAGNOSIS — J189 Pneumonia, unspecified organism: Secondary | ICD-10-CM | POA: Diagnosis not present

## 2022-03-23 DIAGNOSIS — N3000 Acute cystitis without hematuria: Secondary | ICD-10-CM | POA: Diagnosis not present

## 2022-03-23 DIAGNOSIS — I10 Essential (primary) hypertension: Secondary | ICD-10-CM | POA: Diagnosis not present

## 2022-03-23 DIAGNOSIS — J9601 Acute respiratory failure with hypoxia: Secondary | ICD-10-CM | POA: Diagnosis not present

## 2022-03-23 LAB — BASIC METABOLIC PANEL
Anion gap: 12 (ref 5–15)
BUN: 28 mg/dL — ABNORMAL HIGH (ref 8–23)
CO2: 23 mmol/L (ref 22–32)
Calcium: 8.5 mg/dL — ABNORMAL LOW (ref 8.9–10.3)
Chloride: 98 mmol/L (ref 98–111)
Creatinine, Ser: 0.78 mg/dL (ref 0.44–1.00)
GFR, Estimated: >60 mL/min (ref >60)
Glucose, Bld: 236 mg/dL — ABNORMAL HIGH (ref 70–99)
Potassium: 3.6 mmol/L (ref 3.5–5.1)
Sodium: 133 mmol/L — ABNORMAL LOW (ref 135–145)

## 2022-03-23 LAB — CBC
HCT: 25.4 % — ABNORMAL LOW (ref 36.0–46.0)
Hemoglobin: 8.5 g/dL — ABNORMAL LOW (ref 12.0–15.0)
MCH: 29.2 pg (ref 26.0–34.0)
MCHC: 33.5 g/dL (ref 30.0–36.0)
MCV: 87.3 fL (ref 80.0–100.0)
Platelets: 284 10*3/uL (ref 150–400)
RBC: 2.91 MIL/uL — ABNORMAL LOW (ref 3.87–5.11)
RDW: 14.1 % (ref 11.5–15.5)
WBC: 7.4 10*3/uL (ref 4.0–10.5)
nRBC: 0 % (ref 0.0–0.2)

## 2022-03-23 LAB — GLUCOSE, CAPILLARY
Glucose-Capillary: 243 mg/dL — ABNORMAL HIGH (ref 70–99)
Glucose-Capillary: 292 mg/dL — ABNORMAL HIGH (ref 70–99)
Glucose-Capillary: 278 mg/dL — ABNORMAL HIGH (ref 70–99)
Glucose-Capillary: 281 mg/dL — ABNORMAL HIGH (ref 70–99)

## 2022-03-23 MED ORDER — LEVOFLOXACIN 750 MG PO TABS
750.0000 mg | ORAL_TABLET | Freq: Every day | ORAL | Status: DC
  Administered 2022-03-23 – 2022-03-25 (×3): 750 mg via ORAL
  Filled 2022-03-23 (×3): qty 1

## 2022-03-23 MED ORDER — ALBUTEROL SULFATE (2.5 MG/3ML) 0.083% IN NEBU
2.5000 mg | INHALATION_SOLUTION | Freq: Three times a day (TID) | RESPIRATORY_TRACT | Status: DC
  Administered 2022-03-23 (×2): 2.5 mg via RESPIRATORY_TRACT
  Filled 2022-03-23 (×3): qty 3

## 2022-03-23 MED ORDER — ALBUTEROL SULFATE (2.5 MG/3ML) 0.083% IN NEBU: 2.5000 mg | INHALATION_SOLUTION | Freq: Three times a day (TID) | RESPIRATORY_TRACT | Status: DC

## 2022-03-23 NOTE — Progress Notes (Signed)
PROGRESS NOTE    Melissa Leon  [id number] DOB: 11-21-1945 DOA: 03/19/2022 PCP: Katherina Mires, MD   Brief Narrative: Melissa Leon is a 77 y.o. female with a history diabetes mellitus and hypertension. Patient recently diagnosed with epidural abscess currently on daptomycin. Patient presented secondary to fever and weakness and found to have evidence of pneumonia with associated respiratory failure and UTI. Patient started on supplemental oxygen and empiric antibiotics. Cultures obtained and are no growth to date.   Assessment and Plan:  Community acquired pneumonia Complicated by history of prior spinal epidural abscess, on daptomycin prior to admission. Patient started empirically on Vancomycin, azithromycin and Cefepime. Azithromycin discontinued per ID recommendations. Leukocytosis persistent. Repeat chest x-ray (3/2) significant for worsening infiltrates and chest x-ray on 3/3 slight improved/stable. -Continue Vancomycin, Cefepime -Continued ID recommendations: pending today  Acute respiratory failure with hypoxia Secondary to above. Patient requiring as much as 10 L/min of oxygen with associated dyspnea; weaned down to 2 L/min. Recurrent dyspnea responding to albuterol but requiring increase back up to 8 L/min of oxygen. Repeat chest x-ray (3/2) with evidence of worsening infiltrate, likely contributing. Patient suffered recurrent dyspnea with associated increased work of breathing on 3/3 which improved with solu-medrol IV x1, Lasix and albuterol. -Wean to room air as able -Ambulatory pulse ox prior to discharge -Albuterol TID -Continue incentive spirometer  Acute cystitis without hematuria Patient with frequency. No dysuria. Urinalysis suggests possible UTI. Complicated by present of ureteral stent. Urine culture significant for 10,000 colonies/mL of yeast.  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 Patient follows with neurosurgery and ID and is s/p PICC  on Daptomycin. Daptomycin transitioned to Vancomycin this admission for coverage of pneumonia. Incision site does not appear to be acutely infected. Discussed with Dr. Kathyrn Sheriff, neurosurgery who will review imaging but states unlikely management at this time.  Sepsis Present on admission. Secondary to CAP but complicated by concern for UTI and history of MRSA spinal epidural abscess. ID is consulted. Blood cultures no growth to date. Urine culture significant for yeast.  History of right ureteral obstruction Patient is s/p right ureteral stent after recent right nephrostomy tube removal. CT abdomen significant for dilation of right pelvis. Discussed with Dr. Alinda Money who recommends continued treatment of UTI and to re-consult if patient develops associated AKI.  Primary hypertension Noted.   Diabetes mellitus type 2 Controlled. Hemoglobin A1C of 5.6%. Blood sugar elevated this morning secondary to steroid administration on 3/3 -SSI  Hyponatremia Mild. Stable.   DVT prophylaxis: Heparin subq Code Status:   Code Status: Full Code Family Communication: Daughter at bedside Disposition Plan: Discharge home likely in 3-4 days pending continued infection management and specialist recommendations, ability to wean oxygen, continued clinical improvement   Consultants:  PCCM Infectious disease  Procedures:  None  Antimicrobials: Vancomycin Cefepime Azithromycin    Subjective: Patient feels well today. No issues overnight. Breathing well today without dyspnea.  Objective: BP 129/70 (BP Location: Left Arm)   Pulse 91   Temp (!) 97.5 F (36.4 C) (Oral)   Resp 20   Ht '5\' 7"'$  (1.702 m)   Wt 76.9 kg   SpO2 96%   BMI 26.55 kg/m   Examination:  General exam: Appears calm and comfortable Respiratory system: Clear to auscultation. Respiratory effort normal. Cardiovascular system: S1 & S2 heard, RRR. Gastrointestinal system: Abdomen is nondistended, soft and nontender. Normal bowel  sounds heard. Central nervous system: Alert and oriented. No focal neurological deficits. Musculoskeletal: No edema. No calf  tenderness Skin: No cyanosis. No rashes Psychiatry: Judgement and insight appear normal. Mood & affect appropriate.   Data Reviewed: I have personally reviewed following labs and imaging studies  CBC Lab Results  Component Value Date   WBC 7.4 03/23/2022   RBC 2.91 (L) 03/23/2022   HGB 8.5 (L) 03/23/2022   HCT 25.4 (L) 03/23/2022   MCV 87.3 03/23/2022   MCH 29.2 03/23/2022   PLT 284 03/23/2022   MCHC 33.5 03/23/2022   RDW 14.1 03/23/2022   LYMPHSABS 0.6 (L) 03/22/2022   MONOABS 0.6 03/22/2022   EOSABS 0.7 (H) 03/22/2022   BASOSABS 0.0 [id number]     Last metabolic panel Lab Results  Component Value Date   NA 133 (L) 03/23/2022   K 3.6 03/23/2022   CL 98 03/23/2022   CO2 23 03/23/2022   BUN 28 (H) 03/23/2022   CREATININE 0.78 03/23/2022   GLUCOSE 236 (H) 03/23/2022   GFRNONAA >60 03/23/2022   CALCIUM 8.5 (L) 03/23/2022   PHOS 3.8 03/06/2022   PROT 5.8 (L) 03/22/2022   ALBUMIN 1.9 (L) 03/22/2022   BILITOT 1.2 03/22/2022   ALKPHOS 101 03/22/2022   AST 33 03/22/2022   ALT 29 03/22/2022   ANIONGAP 12 03/23/2022    GFR: Estimated Creatinine Clearance: 63.9 mL/min (by C-G formula based on SCr of 0.78 mg/dL).  Recent Results (from the past 240 hour(s))  Blood culture (routine x 2)     Status: None (Preliminary result)   Collection Time: 03/19/22  7:35 PM   Specimen: BLOOD RIGHT HAND  Result Value Ref Range Status   Specimen Description BLOOD RIGHT HAND  Final   Special Requests   Final    BOTTLES DRAWN AEROBIC ONLY Blood Culture adequate volume   Culture   Final    NO GROWTH 4 DAYS Performed at [facility], [address] [place], [place] [address]    Report Status PENDING  Incomplete  Blood culture (routine x 2)     Status: None (Preliminary result)   Collection Time: 03/19/22  7:48 PM   Specimen: BLOOD RIGHT ARM  Result  Value Ref Range Status   Specimen Description BLOOD RIGHT ARM  Final   Special Requests   Final    BOTTLES DRAWN AEROBIC AND ANAEROBIC Blood Culture adequate volume   Culture   Final    NO GROWTH 4 DAYS Performed at [facility], [place] [address], [place], [place] [address]    Report Status PENDING  Incomplete  Resp panel by RT-PCR (RSV, Flu A&B, Covid) Anterior Nasal Swab     Status: None   Collection Time: 03/19/22 10:25 PM   Specimen: Anterior Nasal Swab  Result Value Ref Range Status   SARS Coronavirus 2 by RT PCR NEGATIVE NEGATIVE Final   Influenza A by PCR NEGATIVE NEGATIVE Final   Influenza B by PCR NEGATIVE NEGATIVE Final    Comment: (NOTE) The Xpert Xpress SARS-CoV-2/FLU/RSV plus assay is intended as an aid in the diagnosis of influenza from Nasopharyngeal swab specimens and should not be used as a sole basis for treatment. Nasal washings and aspirates are unacceptable for Xpert Xpress SARS-CoV-2/FLU/RSV testing.  Fact Sheet for Patients: EntrepreneurPulse.com.au  Fact Sheet for Healthcare Providers: IncredibleEmployment.be  This test is not yet approved or cleared by the [place] FDA and has been authorized for detection and/or diagnosis of SARS-CoV-2 by FDA under an Emergency Use Authorization (EUA). This EUA will remain in effect (meaning this test can be used) for  the duration of the COVID-19 declaration under Section 564(b)(1) of the Act, 21 U.S.C. section 360bbb-3(b)(1), unless the authorization is terminated or revoked.     Resp Syncytial Virus by PCR NEGATIVE NEGATIVE Final    Comment: (NOTE) Fact Sheet for Patients: EntrepreneurPulse.com.au  Fact Sheet for Healthcare Providers: IncredibleEmployment.be  This test is not yet approved or cleared by the [place] FDA and has been authorized for detection and/or diagnosis of SARS-CoV-2 by FDA under an Emergency Use  Authorization (EUA). This EUA will remain in effect (meaning this test can be used) for the duration of the COVID-19 declaration under Section 564(b)(1) of the Act, 21 U.S.C. section 360bbb-3(b)(1), unless the authorization is terminated or revoked.  Performed at [facility], [place] [address] [place], [place] [address]   Urine Culture     Status: Abnormal   Collection Time: 03/20/22 12:45 AM   Specimen: Urine, Random  Result Value Ref Range Status   Specimen Description URINE, RANDOM  Final   Special Requests   Final    NONE Reflexed from [id number] Performed at [facility], [place] [address], [place], [place] [address]    Culture 10,000 COLONIES/mL YEAST (A)  Final   Report Status 03/21/2022 FINAL  Final  MRSA Next Gen by PCR, Nasal     Status: None   Collection Time: 03/20/22  8:15 AM   Specimen: Nasal Mucosa; Nasal Swab  Result Value Ref Range Status   MRSA by PCR Next Gen NOT DETECTED NOT DETECTED Final    Comment: (NOTE) The GeneXpert MRSA Assay (FDA approved for NASAL specimens only), is one component of a comprehensive MRSA colonization surveillance program. It is not intended to diagnose MRSA infection nor to guide or monitor treatment for MRSA infections. Test performance is not FDA approved in patients less than 60 years old. Performed at [facility], [place] [address] [place], [place] [address]   Respiratory (~20 pathogens) panel by PCR     Status: None   Collection Time: 03/20/22  8:16 AM   Specimen: Nasopharyngeal Swab; Respiratory  Result Value Ref Range Status   Adenovirus NOT DETECTED NOT DETECTED Final   Coronavirus 229E NOT DETECTED NOT DETECTED Final    Comment: (NOTE) The Coronavirus on the Respiratory Panel, DOES NOT test for the novel  Coronavirus (2019 nCoV)    Coronavirus HKU1 NOT DETECTED NOT DETECTED Final   Coronavirus NL63 NOT DETECTED NOT DETECTED Final   Coronavirus OC43 NOT DETECTED NOT DETECTED Final   Metapneumovirus NOT  DETECTED NOT DETECTED Final   Rhinovirus / Enterovirus NOT DETECTED NOT DETECTED Final   Influenza A NOT DETECTED NOT DETECTED Final   Influenza B NOT DETECTED NOT DETECTED Final   Parainfluenza Virus 1 NOT DETECTED NOT DETECTED Final   Parainfluenza Virus 2 NOT DETECTED NOT DETECTED Final   Parainfluenza Virus 3 NOT DETECTED NOT DETECTED Final   Parainfluenza Virus 4 NOT DETECTED NOT DETECTED Final   Respiratory Syncytial Virus NOT DETECTED NOT DETECTED Final   Bordetella pertussis NOT DETECTED NOT DETECTED Final   Bordetella Parapertussis NOT DETECTED NOT DETECTED Final   Chlamydophila pneumoniae NOT DETECTED NOT DETECTED Final   Mycoplasma pneumoniae NOT DETECTED NOT DETECTED Final    Comment: Performed at [facility], [place]. [address] [place], [place] [address]      Radiology Studies: DG CHEST PORT 1 VIEW  Result Date: 03/22/2022 CLINICAL DATA:  [address] Tachypnea 786.06.ICD-9-CM [id number] Pneumonia [id number] EXAM: PORTABLE CHEST 1 VIEW  COMPARISON:  March 21, 2022 FINDINGS: The cardiomediastinal silhouette is unchanged in contour.RIGHT upper extremity PICC tip terminates over the inferior SVC. Atherosclerotic calcifications. Small LEFT pleural effusion. No pneumothorax. Mildly improved aeration of the LEFT upper lung heterogeneous airspace opacity. Mildly increased confluence of the RIGHT upper lung heterogeneous airspace opacity. Similar appearance of patchy opacities at the RIGHT lung base. IMPRESSION: 1. Mildly increased confluence of the RIGHT upper lung airspace opacity. Minimally improved aeration of the LEFT upper lung airspace opacity. 2.  Small LEFT pleural effusion. Electronically Signed   By: Valentino Saxon M.D.   On: 03/22/2022 18:16   DG CHEST PORT 1 VIEW  Result Date: 03/21/2022 CLINICAL DATA:  Hypoxia EXAM: PORTABLE CHEST 1 VIEW COMPARISON:  Chest x-ray 03/19/2022 FINDINGS: Increasing bilateral parenchymal opacities with increasing confluence area left upper lobe and  developing new areas scattered throughout the right lung. Recommend follow-up. No pneumothorax, effusion or edema. Normal cardiopericardial silhouette. Calcified aorta. Right-sided PIC catheter in place with tip overlying the central SVC. Degenerative changes of the spine. Overlapping cardiac leads. IMPRESSION: Increasing bilateral consolidative infiltrative opacities. Recommend follow-up Electronically Signed   By: Jill Side M.D.   On: 03/21/2022 10:55      LOS: 4 days    Cordelia Poche, MD [facility] 03/23/2022, 9:26 AM   If 7PM-7AM, please contact night-coverage [email or web address]

## 2022-03-23 NOTE — Plan of Care (Signed)
  Problem: Education: Goal: Ability to verbalize activity precautions or restrictions will improve Outcome: Progressing Goal: Knowledge of the prescribed therapeutic regimen will improve Outcome: Progressing Goal: Understanding of discharge needs will improve Outcome: Progressing   Problem: Activity: Goal: Ability to avoid complications of mobility impairment will improve Outcome: Progressing Goal: Ability to tolerate increased activity will improve Outcome: Progressing Goal: Will remain free from falls Outcome: Progressing   Problem: Bowel/Gastric: Goal: Gastrointestinal status for postoperative course will improve Outcome: Progressing   Problem: Clinical Measurements: Goal: Ability to maintain clinical measurements within normal limits will improve Outcome: Progressing Goal: Postoperative complications will be avoided or minimized Outcome: Progressing Goal: Diagnostic test results will improve Outcome: Progressing   Problem: Pain Management: Goal: Pain level will decrease Outcome: Progressing   Problem: Skin Integrity: Goal: Will show signs of wound healing Outcome: Progressing   Problem: Health Behavior/Discharge Planning: Goal: Identification of resources available to assist in meeting health care needs will improve Outcome: Progressing   Problem: Bladder/Genitourinary: Goal: Urinary functional status for postoperative course will improve Outcome: Progressing   Problem: Education: Goal: Knowledge of General Education information will improve Description: Including pain rating scale, medication(s)/side effects and non-pharmacologic comfort measures Outcome: Progressing   Problem: Health Behavior/Discharge Planning: Goal: Ability to manage health-related needs will improve Outcome: Progressing   Problem: Clinical Measurements: Goal: Ability to maintain clinical measurements within normal limits will improve Outcome: Progressing Goal: Will remain free from  infection Outcome: Progressing Goal: Diagnostic test results will improve Outcome: Progressing Goal: Respiratory complications will improve Outcome: Progressing Goal: Cardiovascular complication will be avoided Outcome: Progressing   Problem: Activity: Goal: Risk for activity intolerance will decrease Outcome: Progressing   Problem: Nutrition: Goal: Adequate nutrition will be maintained Outcome: Progressing   Problem: Coping: Goal: Level of anxiety will decrease Outcome: Progressing   Problem: Elimination: Goal: Will not experience complications related to bowel motility Outcome: Progressing Goal: Will not experience complications related to urinary retention Outcome: Progressing   Problem: Pain Managment: Goal: General experience of comfort will improve Outcome: Progressing   Problem: Safety: Goal: Ability to remain free from injury will improve Outcome: Progressing   Problem: Skin Integrity: Goal: Risk for impaired skin integrity will decrease Outcome: Progressing   Problem: Education: Goal: Ability to describe self-care measures that may prevent or decrease complications (Diabetes Survival Skills Education) will improve Outcome: Progressing Goal: Individualized Educational Video(s) Outcome: Progressing   Problem: Coping: Goal: Ability to adjust to condition or change in health will improve Outcome: Progressing   Problem: Fluid Volume: Goal: Ability to maintain a balanced intake and output will improve Outcome: Progressing   Problem: Health Behavior/Discharge Planning: Goal: Ability to identify and utilize available resources and services will improve Outcome: Progressing Goal: Ability to manage health-related needs will improve Outcome: Progressing   Problem: Metabolic: Goal: Ability to maintain appropriate glucose levels will improve Outcome: Progressing   Problem: Nutritional: Goal: Maintenance of adequate nutrition will improve Outcome:  Progressing Goal: Progress toward achieving an optimal weight will improve Outcome: Progressing   Problem: Skin Integrity: Goal: Risk for impaired skin integrity will decrease Outcome: Progressing   Problem: Tissue Perfusion: Goal: Adequacy of tissue perfusion will improve Outcome: Progressing   

## 2022-03-23 NOTE — Plan of Care (Signed)
  Problem: Education: Goal: Ability to verbalize activity precautions or restrictions will improve Outcome: Progressing Goal: Understanding of discharge needs will improve Outcome: Progressing   Problem: Activity: Goal: Ability to avoid complications of mobility impairment will improve Outcome: Progressing Goal: Ability to tolerate increased activity will improve Outcome: Progressing   Problem: Nutrition: Goal: Adequate nutrition will be maintained Outcome: Progressing   Problem: Pain Managment: Goal: General experience of comfort will improve Outcome: Progressing   Problem: Coping: Goal: Ability to adjust to condition or change in health will improve Outcome: Progressing   Problem: Fluid Volume: Goal: Ability to maintain a balanced intake and output will improve Outcome: Progressing   Problem: Health Behavior/Discharge Planning: Goal: Ability to identify and utilize available resources and services will improve Outcome: Progressing   Problem: Coping: Goal: Level of anxiety will decrease Outcome: Not Progressing   Problem: Metabolic: Goal: Ability to maintain appropriate glucose levels will improve Outcome: Not Progressing

## 2022-03-23 NOTE — Progress Notes (Signed)
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  Do not think this is dapto eos PNA as peripheral eos count normal. -CT showed patchy airspace opacity in the bilateral lower lung suspicious for pneumonia.  Interval removal of right nephrostomy tube with new development of right pelvis dilation.  Recommend correlation with proper function of right ureteral stent. Primary communicated with Urology, Dr. Alinda Money recommend tx for UTI. UA + yeast(does not require treatment). - Patient states had no respiratory complaints(reports CT 2 months ago with urology showed patchy opacity) but given improvement on cefepime will plan to complete 7 days of antibiotics for pneumonia.     #Lumbar epidural abscess SP L3-L5 evacuation, hemilaminectomy/medial  facetetectomy with Cx+ MRSA -Discharged on dapto x 8 weeks EOT 04/12/22 -On 2/29 PICC line noted to be bleeding. -F/U with Dr. West Bali on 03/31/22  Recommendations: - D/C cefepime -Start levaquin to complete 7 days(EOT 3/ 6)of abx for hospital associated PNA -Continue vancomycin  inpatient>dapto on discharge EOT 04/12/22 - F/U with Urology   ID will sign off   OPAT ORDERS:  Diagnosis: Epidural abscess  Culture Result: MRSA  Allergies  Allergen Reactions   Choline Fenofibrate Other (See Comments)    ELEVATED LFT.    Penicillins Hives   Ozempic (0.25 Or 0.5 Mg-Dose) [Semaglutide(0.25 Or 0.'5mg'$ -Dos)] Nausea Only   Statins Other (See Comments)    Aches, pain     Discharge antibiotics to be given via PICC line:  Per pharmacy protocol daptomycin '8mg'$ /kg/day    Duration: 8 weeks End Date: 04/12/22  [facility] Care Per Protocol with [employer] Use: Home health RN for IV administration and teaching, line care and labs.    Labs weekly while on IV antibiotics: _x_ CBC with differential __ BMP **TWICE WEEKLY ON VANCOMYCIN  x__ CMP _x_ CRP _x_ ESR __ Vancomycin trough TWICE WEEKLY _x_ CK  x__ Please pull PIC at completion of IV antibiotics __ Please leave PIC in place until doctor has seen patient or been notified  Fax weekly labs to [phone]  Clinic Follow Up Appt: 3/12  @ [facility] with Dr. West Bali   Microbiology:   Antibiotics: Vancomycin 2/29- Cefepime 2/29- Azithromycin 3/1   Cultures: Blood 2/29 Urine 2/29    SUBJECTIVE: Resting in bed No new complaints. Interval: wbc 7.4k, Afebrile overnight.   Review of Systems: Review of Systems  All other systems reviewed and are negative.    Scheduled Meds:  albuterol  2.5 mg Nebulization TID   Chlorhexidine Gluconate Cloth  6 each Topical Daily   heparin  5,000 Units Subcutaneous Q8H  insulin aspart  0-15 Units Subcutaneous TID WC   levofloxacin  750 mg Oral Daily   Continuous Infusions:  vancomycin Stopped (03/23/22 0043)   PRN Meds:.acetaminophen, albuterol, flavoxATE, ondansetron **OR** ondansetron (ZOFRAN) IV, mouth rinse Allergies  Allergen Reactions   Choline Fenofibrate Other (See Comments)    ELEVATED LFT.    Penicillins Hives   Ozempic (0.25 Or 0.5 Mg-Dose)  [Semaglutide(0.25 Or 0.'5mg'$ -Dos)] Nausea Only   Statins Other (See Comments)    Aches, pain    OBJECTIVE: Vitals:   03/23/22 0453 03/23/22 0743 03/23/22 0750 03/23/22 1218  BP: 119/77  129/70 132/71  Pulse: 80 92 91 98  Resp: '18 20 20 20  '$ Temp: (!) 97.5 F (36.4 C)   98.2 F (36.8 C)  TempSrc: Oral   Oral  SpO2: 100% 96%  97%  Weight:      Height:       Body mass index is 26.55 kg/m.  Physical Exam Constitutional:      Appearance: Normal appearance.  HENT:     Head: Normocephalic and atraumatic.     Right Ear: Tympanic membrane normal.     Left Ear: Tympanic membrane normal.     Nose: Nose normal.     Mouth/Throat:     Mouth: Mucous membranes are moist.  Eyes:     Extraocular Movements: Extraocular movements intact.     Conjunctiva/sclera: Conjunctivae normal.     Pupils: Pupils are equal, round, and reactive to light.  Cardiovascular:     Rate and Rhythm: Normal rate and regular rhythm.     Heart sounds: No murmur heard.    No friction rub. No gallop.  Pulmonary:     Effort: Pulmonary effort is normal.     Breath sounds: Normal breath sounds.  Abdominal:     General: Abdomen is flat.     Palpations: Abdomen is soft.  Musculoskeletal:        General: Normal range of motion.  Skin:    General: Skin is warm and dry.  Neurological:     General: No focal deficit present.     Mental Status: She is alert and oriented to person, place, and time.  Psychiatric:        Mood and Affect: Mood normal.       Lab Results Lab Results  Component Value Date   WBC 7.4 03/23/2022   HGB 8.5 (L) 03/23/2022   HCT 25.4 (L) 03/23/2022   MCV 87.3 03/23/2022   PLT 284 03/23/2022    Lab Results  Component Value Date   CREATININE 0.78 03/23/2022   BUN 28 (H) 03/23/2022   NA 133 (L) 03/23/2022   K 3.6 03/23/2022   CL 98 03/23/2022   CO2 23 03/23/2022    Lab Results  Component Value Date   ALT 29 03/22/2022   AST 33 03/22/2022   ALKPHOS 101 03/22/2022   BILITOT 1.2  03/22/2022        Laurice Record, [place] [facility] Infectious [facility] 03/23/2022, 1:15 PM

## 2022-03-23 NOTE — Progress Notes (Signed)
PHARMACY CONSULT NOTE FOR:  OUTPATIENT  PARENTERAL ANTIBIOTIC THERAPY (OPAT)  Indication: MRSA epidural abscess Regimen: Daptomycin 650 mg IV every 24 hours End date: 04/12/22 (8 weeks from 03/01/22)  IV antibiotic discharge orders are pended. To discharging provider:  please sign these orders via discharge navigator,  Select New Orders & click on the button choice - Manage This Unsigned Work.     Thank you for allowing pharmacy to be a part of this patient's care.  Jimmy Footman, PharmD, BCPS, BCIDP Infectious Diseases Clinical Pharmacist Phone: [phone] 03/23/2022 2:26 PM   **Pharmacist phone directory can now be found on [email or web address] ([email or web address]).  Listed under [place].

## 2022-03-23 NOTE — Evaluation (Signed)
Physical Therapy Evaluation Patient Details Name: Melissa Leon MRN: [id number] DOB: 01/13/1946 Today's Date: 03/23/2022  History of Present Illness  Pt is 77 yo female presenting with fever, weakness. Found to have pneumonia with associated respiratory failure and UTI. Pt history of spinal epidural abscess and recent back surgery within the past 3 weeks. PMH: DM II, hyponatremia, HTN  Clinical Impression  Pt is presenting close to baseline. She was working with HHPT/OT prior to hospitalization with family supervision 24/7. Pt is currently supervision for most functional activities. Recommending continued skilled physical therapy in HHPT setting on discharge from acute care hospital setting in order to decrease risk for falls, injury and re-hospitalization with 24/7 family support. Pt on 4L throughout session with O2 sats above 92% with gait and HR up to 121 bpm.      Recommendations for follow up therapy are one component of a multi-disciplinary discharge planning process, led by the attending physician.  Recommendations may be updated based on patient status, additional functional criteria and insurance authorization.  Follow Up Recommendations Home health PT      Assistance Recommended at Discharge Intermittent Supervision/Assistance  Patient can return home with the following  A little help with walking and/or transfers;Assistance with cooking/housework;Assist for transportation;Help with stairs or ramp for entrance    Equipment Recommendations None recommended by PT  Recommendations for Other Services       Functional Status Assessment Patient has had a recent decline in their functional status and demonstrates the ability to make significant improvements in function in a reasonable and predictable amount of time.     Precautions / Restrictions Precautions Precautions: Back;Fall Precaution Comments: Pt is able to re-call her precautions from prior  hospitalization Restrictions Weight Bearing Restrictions: No      Mobility  Bed Mobility Overal bed mobility: Modified Independent             General bed mobility comments: Pt was Mod I for supine<>sitting EOB with HOB elevated ~30 degrees Patient Response: Cooperative  Transfers Overall transfer level: Needs assistance Equipment used: Rolling walker (2 wheels) Transfers: Sit to/from Stand Sit to Stand: Supervision           General transfer comment: Supervision for RW and management of AD. Assistance with lines/leads    Ambulation/Gait Ambulation/Gait assistance: Supervision Gait Distance (Feet): 50 Feet Assistive device: Rolling walker (2 wheels) Gait Pattern/deviations: Step-through pattern, Decreased stride length Gait velocity: Decreased cadence. Gait velocity interpretation: <1.8 ft/sec, indicate of risk for recurrent falls   General Gait Details: Partial step through gait pattern with RW with very slow cadence. No overt LOB, pt requires occasional cues for proximity to AD due to anterior progression. Pt ambulated on 4 L O2 via [place] with O2 sats at 92% and above. HR up to 121 bpm.  Stairs         General stair comments: Pt deferred out of room activities today     Balance Overall balance assessment: Mild deficits observed, not formally tested       Pertinent Vitals/Pain Pain Assessment Pain Assessment: No/denies pain    Home Living Family/patient expects to be discharged to:: Private residence Living Arrangements: Alone Available Help at Discharge: Family;Available 24 hours/day (brother from [facility] and daughter 24/7) Type of Home: House Home Access: Stairs to enter   [employer] of Steps: 1   Home Layout: One level Home Equipment: [place] - single Barista (2 wheels);Wheelchair - manual;BSC/3in1;Shower seat;Grab bars - toilet;Grab bars - tub/shower  Prior Function Prior Level of Function : Independent/Modified  Independent             Mobility Comments: Pt was working with PT and OT prior to hospitalization. She states that she was ambulating with the RW but could lift it up and didn't have to let it touch the floor. ADLs Comments: Getting dressed without help but extra time. Taking sponge bathes due to the PICC line Mod I and using BSC Mod I at night text to her bed and then during the day over the regular toilet.     Hand Dominance   Dominant Hand: Left    Extremity/Trunk Assessment   Upper Extremity Assessment Upper Extremity Assessment: Defer to OT evaluation    Lower Extremity Assessment Lower Extremity Assessment: Overall WFL for tasks assessed       Communication   Communication: No difficulties  Cognition Arousal/Alertness: Awake/alert Behavior During Therapy: WFL for tasks assessed/performed Overall Cognitive Status: Within Functional Limits for tasks assessed       General Comments General comments (skin integrity, edema, etc.): Pt brother present during visit. Very supportive and is able to stay with pt indefinitely.        Assessment/Plan    PT Assessment Patient needs continued PT services  PT Problem List Decreased strength;Decreased balance;Decreased mobility;Decreased knowledge of use of DME;Decreased knowledge of precautions;Impaired sensation       PT Treatment Interventions DME instruction;Gait training;Stair training;Functional mobility training;Therapeutic activities;Therapeutic exercise;Balance training;Patient/family education    PT Goals (Current goals can be found in the Care Plan section)  Acute Rehab PT Goals Patient Stated Goal: Return home with family support. PT Goal Formulation: With patient Time For Goal Achievement:  Potential to Achieve Goals: Good    Frequency Min 3X/week        AM-PAC PT "6 Clicks" Mobility  Outcome Measure Help needed turning from your back to your side while in a flat bed without using bedrails?:  None Help needed moving from lying on your back to sitting on the side of a flat bed without using bedrails?: None Help needed moving to and from a bed to a chair (including a wheelchair)?: A Little Help needed standing up from a chair using your arms (e.g., wheelchair or bedside chair)?: A Little Help needed to walk in hospital room?: A Little Help needed climbing 3-5 steps with a railing? : A Little 6 Click Score: 20    End of Session   Activity Tolerance: Patient tolerated treatment well Patient left: in bed;with call bell/phone within reach;with family/visitor present Nurse Communication: Mobility status PT Visit Diagnosis: Unsteadiness on feet (R26.81)    Time: [id number] PT Time Calculation (min) (ACUTE ONLY): 34 min   Charges:   PT Evaluation $PT Eval Low Complexity: 1 Low PT Treatments $Therapeutic Activity: 8-22 mins       Tomma Rakers, DPT, [place] Office: [phone] (Secure chat preferred)   Ander Purpura 03/23/2022, 4:31 PM

## 2022-03-23 NOTE — TOC Progression Note (Signed)
Transition of Care [facility]) - Progression Note    Patient Details  Name: Melissa Leon MRN: [id number] Date of Birth: 1945-06-21  Transition of Care [facility]) CM/SW Contact  Pollie Friar, RN Phone Number: 03/23/2022, 2:52 PM  Clinical Narrative:    Pt plans to return home with continued services through [facility] and [facility] when medically ready. Her brother will be staying with her and administering the abx at home.  Pt continues on 5L [place] of oxygen and was not on oxygen at home prior.  TOC following.   Expected Discharge Plan: [place] Barriers to Discharge: Continued Medical Work up  Expected Discharge Plan and Services   Discharge Planning Services: CM Consult Post Acute Care Choice: [place] arrangements for the past 2 months: Single Family Home                                       Social Determinants of Health (SDOH) Interventions SDOH Screenings   Food Insecurity: No Food Insecurity (03/20/2022)  Housing: Low Risk  (03/20/2022)  Transportation Needs: No Transportation Needs (03/20/2022)  Utilities: Not At Risk (03/20/2022)  Tobacco Use: Medium Risk (03/19/2022)    Readmission Risk Interventions    01/06/2022    9:26 AM  Readmission Risk Prevention Plan  Transportation Screening Complete  PCP or Specialist Appt within 5-7 Days Complete  Home Care Screening Complete  Medication Review (RN CM) Complete

## 2022-03-23 NOTE — Evaluation (Signed)
Occupational Therapy Evaluation Patient Details Name: Jrue V Geeting MRN: [id number] DOB: 09-25-1945 Today's Date: 03/23/2022   History of Present Illness Pt is 77 yo female presenting with fever, weakness. Found to have pneumonia with associated respiratory failure and UTI. Pt history of spinal epidural abscess and recent back surgery within the past 3 weeks. PMH: DM II, hyponatremia, HTN   Clinical Impression   Pt admitted for the diagnosis above. PTA patient was Mod I in bathing and dressing and functional mobility with assistance from brother as needed. Pt currently presents with generalized UE weakness and mild balance impairments. Pt has some difficulty successfully completing log rolling. OT in the acute care setting indicated to address deficits listed and assist with transition to the next level of care. OT recommends [facility] OT services  post acute to maximize functional independence.       Recommendations for follow up therapy are one component of a multi-disciplinary discharge planning process, led by the attending physician.  Recommendations may be updated based on patient status, additional functional criteria and insurance authorization.   Follow Up Recommendations  Home health OT     Assistance Recommended at Discharge Intermittent Supervision/Assistance  Patient can return home with the following Help with stairs or ramp for entrance;Assist for transportation;Assistance with cooking/housework    Functional Status Assessment  Patient has had a recent decline in their functional status and demonstrates the ability to make significant improvements in function in a reasonable and predictable amount of time.  Equipment Recommendations  None recommended by OT    Recommendations for Other Services       Precautions / Restrictions Precautions Precautions: Fall;Back Precaution Booklet Issued: No Precaution Comments: Pt verbally demonstrated understanding of  precautions Restrictions Weight Bearing Restrictions: No      Mobility Bed Mobility Overal bed mobility: Needs Assistance Bed Mobility: Sit to Supine Rolling: Supervision Sidelying to sit: Supervision   Sit to supine: Supervision     Patient Response: Cooperative  Transfers Overall transfer level: Needs assistance   Transfers: Sit to/from Stand, Bed to chair/wheelchair/BSC Sit to Stand: Min guard     Step pivot transfers: Min guard            Balance Overall balance assessment: Needs assistance Sitting-balance support: Feet supported Sitting balance-Leahy Scale: Good     Standing balance support: Single extremity supported Standing balance-Leahy Scale: Fair                             ADL either performed or assessed with clinical judgement   ADL       Grooming: Wash/dry hands;Oral care;Brushing hair;Set up;Sitting               Lower Body Dressing: Min guard;Sit to/from stand   Toilet Transfer: Min guard;Rolling walker (2 wheels);Ambulation;Regular Toilet   Toileting- Clothing Manipulation and Hygiene: Minimal assistance;Sit to/from stand               Vision Patient Visual Report: No change from baseline Vision Assessment?: No apparent visual deficits     Perception     Praxis      Pertinent Vitals/Pain Pain Assessment Pain Assessment: Faces Pain Score: 0-No pain Faces Pain Scale: No hurt Pain Intervention(s): Monitored during session     Hand Dominance Left   Extremity/Trunk Assessment Upper Extremity Assessment Upper Extremity Assessment: Generalized weakness   Lower Extremity Assessment Lower Extremity Assessment: Defer to PT evaluation  Communication Communication Communication: No difficulties   Cognition Arousal/Alertness: Awake/alert Behavior During Therapy: WFL for tasks assessed/performed Overall Cognitive Status: Within Functional Limits for tasks assessed                                  General Comments: Cueing as needed for back precautions     General Comments  Pt brother present during visit. Very supportive and is able to stay with pt indefinitely. HR to 113 with mobility.    Exercises     Shoulder Instructions      Home Living Family/patient expects to be discharged to:: Private residence Living Arrangements: Alone Available Help at Discharge: Family;Available 24 hours/day (Currently resides with brother. 24/7 care available) Type of Home: House Home Access: Stairs to enter [employer] of Steps: 1   Home Layout: One level     Bathroom Shower/Tub: Occupational psychologist: Standard Bathroom Accessibility: Yes   Home Equipment: [place] - single Barista (2 wheels);Wheelchair - manual;BSC/3in1;Shower seat;Grab bars - toilet;Grab bars - tub/shower;Adaptive equipment;Hospital bed;Hand held shower head Adaptive Equipment: [place]        Prior Functioning/Environment Prior Level of Function : Independent/Modified Independent             Mobility Comments: Pt was working with PT and OT prior to hospitalization. She states that she was ambulating with the RW but could lift it up and didn't have to let it touch the floor. ADLs Comments: Getting dressed without help but extra time. Taking sponge bathes due to the PICC line Mod I and using BSC Mod I at night text to her bed and then during the day over the regular toilet. Brother completes cooking and cleaning        OT Problem List: Decreased strength;Decreased activity tolerance;Impaired balance (sitting and/or standing);Pain      OT Treatment/Interventions: Self-care/ADL training;Therapeutic exercise;DME and/or AE instruction;Therapeutic activities;Patient/family education;Balance training    OT Goals(Current goals can be found in the care plan section) Acute Rehab OT Goals Patient Stated Goal: To go home OT Goal Formulation: With patient Time For Goal  Achievement:  Potential to Achieve Goals: Good ADL Goals Pt Will Perform Grooming: with set-up;standing Pt Will Transfer to Toilet: with modified independence;ambulating  OT Frequency: Min 2X/week    Co-evaluation              AM-PAC OT "6 Clicks" Daily Activity     Outcome Measure Help from another person eating meals?: None Help from another person taking care of personal grooming?: A Little Help from another person toileting, which includes using toliet, bedpan, or urinal?: A Little Help from another person bathing (including washing, rinsing, drying)?: A Little Help from another person to put on and taking off regular upper body clothing?: None Help from another person to put on and taking off regular lower body clothing?: A Little 6 Click Score: 20   End of Session Equipment Utilized During Treatment: Oxygen Nurse Communication: Mobility status  Activity Tolerance: Patient tolerated treatment well Patient left: in bed;with call bell/phone within reach;with family/visitor present  OT Visit Diagnosis: Muscle weakness (generalized) (M62.81);Pain Pain - Right/Left: Left Pain - part of body: Leg                Time: [id number] OT Time Calculation (min): 16 min Charges:  OT General Charges $OT Visit: 1 Visit OT Evaluation $OT Eval Moderate Complexity: 1 Mod  03/23/2022  RP, OTR/L  Acute Rehabilitation Services  Office:  [phone]   Metta Clines 03/23/2022, 5:38 PM

## 2022-03-24 DIAGNOSIS — I10 Essential (primary) hypertension: Secondary | ICD-10-CM | POA: Diagnosis not present

## 2022-03-24 DIAGNOSIS — J9601 Acute respiratory failure with hypoxia: Secondary | ICD-10-CM | POA: Diagnosis not present

## 2022-03-24 DIAGNOSIS — J189 Pneumonia, unspecified organism: Secondary | ICD-10-CM | POA: Diagnosis not present

## 2022-03-24 DIAGNOSIS — G061 Intraspinal abscess and granuloma: Secondary | ICD-10-CM | POA: Diagnosis not present

## 2022-03-24 LAB — CULTURE, BLOOD (ROUTINE X 2)
Culture: NO GROWTH
Culture: NO GROWTH
Special Requests: ADEQUATE
Special Requests: ADEQUATE

## 2022-03-24 LAB — GLUCOSE, CAPILLARY
Glucose-Capillary: 216 mg/dL — ABNORMAL HIGH (ref 70–99)
Glucose-Capillary: 141 mg/dL — ABNORMAL HIGH (ref 70–99)
Glucose-Capillary: 97 mg/dL (ref 70–99)
Glucose-Capillary: 185 mg/dL — ABNORMAL HIGH (ref 70–99)

## 2022-03-24 NOTE — Progress Notes (Addendum)
PROGRESS NOTE    Melissa Leon  [id number] DOB: Jul 04, 1945 DOA: 03/19/2022 PCP: Katherina Mires, MD   Brief Narrative: Melissa Leon is a 77 y.o. female with a history diabetes mellitus and hypertension. Patient recently diagnosed with epidural abscess currently on daptomycin. Patient presented secondary to fever and weakness and found to have evidence of pneumonia with associated respiratory failure and possible UTI. Patient started on supplemental oxygen and empiric antibiotics (Vancomycin/Cefepime/Azithromycin). Cultures obtained and are no growth to date. Hospitalization complicated by recurrent worsening of respiratory failure. Patient now with improved pneumonia symptoms. Oxygen requirement decreasing. Antibiotics transitioned to Vancomycin/Levaquin per ID.   Assessment and Plan:  Community acquired pneumonia Complicated by history of prior spinal epidural abscess, on daptomycin prior to admission. Patient started empirically on Vancomycin, azithromycin and Cefepime. Azithromycin discontinued per ID recommendations. Leukocytosis persistent. Repeat chest x-ray (3/2) significant for worsening infiltrates and chest x-ray on 3/3 slight improved/stable. -Continue Vancomycin, Levaquin -Continued ID recommendations (3/4): Cefepime transitioned to Levaquin  Acute respiratory failure with hypoxia Secondary to above. Patient requiring as much as 10 L/min of oxygen with associated dyspnea; weaned down to 2 L/min. Recurrent dyspnea responding to albuterol but requiring increase back up to 8 L/min of oxygen. Repeat chest x-ray (3/2) with evidence of worsening infiltrate, likely contributing. Patient suffered recurrent dyspnea with associated increased work of breathing on 3/3 which improved with solu-medrol IV x1, Lasix and albuterol. Weaned to 1 L/min supplemental oxygen. -Wean to room air as able -Ambulatory pulse ox prior to discharge -Albuterol TID -Continue incentive  spirometer  Urinary retention Complicated by recent spinal surgery for epidural abscess. Associated paresthesias. At home, patient was scheduling toileting. Foley catheter catheter placed on 3/1. Patient hoping to have foley catheter removed prior to discharge. -Voiding trail. Remove foley and schedule bladder scans q4 hours for post-void residuals -Scheduled toileting breaks  Acute cystitis without hematuria Patient with frequency. No dysuria. Urinalysis suggests possible UTI. Complicated by present of ureteral stent. Urine culture significant for 10,000 colonies/mL of yeast. No treatment.  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 Patient follows with neurosurgery and ID and is s/p PICC on Daptomycin. Daptomycin transitioned to Vancomycin this admission for coverage of pneumonia. Incision site does not appear to be acutely infected. Discussed with Dr. Kathyrn Sheriff, neurosurgery (2/3) who will review imaging but states unlikely management at this time.  Sepsis Present on admission. Secondary to CAP but complicated by concern for UTI and history of MRSA spinal epidural abscess. ID is consulted. Blood cultures no growth to date. Urine culture significant for yeast.  History of right ureteral obstruction Patient is s/p right ureteral stent after recent right nephrostomy tube removal. CT abdomen significant for dilation of right pelvis. Discussed with Dr. Alinda Money who states dilation is not unexpected and recommends continued treatment of UTI and to re-consult if patient develops associated AKI.  Primary hypertension Noted.   Diabetes mellitus type 2 Controlled. Hemoglobin A1C of 5.6%. Blood sugar continues to be elevated after steroids given on 3/3 -SSI  Hyponatremia Mild. Stable.   DVT prophylaxis: Heparin subq Code Status:   Code Status: Full Code Family Communication: None at bedside Disposition Plan: Discharge home likely in 1-2 days pending continued infection management  and specialist recommendations, ability to wean oxygen, continued clinical improvement   Consultants:  PCCM Infectious disease  Procedures:  None  Antimicrobials: Vancomycin Cefepime Azithromycin Levaquin   Subjective: Patient continues to feel better. No dyspnea today. Breathing well.  Objective: BP 134/76 (BP Location:  Left Arm)   Pulse 96   Temp 98 F (36.7 C) (Oral)   Resp 17   Ht '5\' 7"'$  (1.702 m)   Wt 76.9 kg   SpO2 94%   BMI 26.55 kg/m   Examination:  General exam: Appears calm and comfortable  Respiratory system: Bilateral rales. No wheezing. No rhonchi. Respiratory effort normal. Cardiovascular system: S1 & S2 heard, RRR. No murmurs Gastrointestinal system: Abdomen is nondistended, soft and nontender. Normal bowel sounds heard. Central nervous system: Alert and oriented. No focal neurological deficits. Musculoskeletal: No edema. No calf tenderness Skin: No cyanosis. No rashes Psychiatry: Judgement and insight appear normal. Mood & affect appropriate.     Data Reviewed: I have personally reviewed following labs and imaging studies  CBC Lab Results  Component Value Date   WBC 7.4 03/23/2022   RBC 2.91 (L) 03/23/2022   HGB 8.5 (L) 03/23/2022   HCT 25.4 (L) 03/23/2022   MCV 87.3 03/23/2022   MCH 29.2 03/23/2022   PLT 284 03/23/2022   MCHC 33.5 03/23/2022   RDW 14.1 03/23/2022   LYMPHSABS 0.6 (L) 03/22/2022   MONOABS 0.6 03/22/2022   EOSABS 0.7 (H) 03/22/2022   BASOSABS 0.0 [id number]     Last metabolic panel Lab Results  Component Value Date   NA 133 (L) 03/23/2022   K 3.6 03/23/2022   CL 98 03/23/2022   CO2 23 03/23/2022   BUN 28 (H) 03/23/2022   CREATININE 0.78 03/23/2022   GLUCOSE 236 (H) 03/23/2022   GFRNONAA >60 03/23/2022   CALCIUM 8.5 (L) 03/23/2022   PHOS 3.8 03/06/2022   PROT 5.8 (L) 03/22/2022   ALBUMIN 1.9 (L) 03/22/2022   BILITOT 1.2 03/22/2022   ALKPHOS 101 03/22/2022   AST 33 03/22/2022   ALT 29 03/22/2022   ANIONGAP  12 03/23/2022    GFR: Estimated Creatinine Clearance: 63.9 mL/min (by C-G formula based on SCr of 0.78 mg/dL).  Recent Results (from the past 240 hour(s))  Blood culture (routine x 2)     Status: None   Collection Time: 03/19/22  7:35 PM   Specimen: BLOOD RIGHT HAND  Result Value Ref Range Status   Specimen Description BLOOD RIGHT HAND  Final   Special Requests   Final    BOTTLES DRAWN AEROBIC ONLY Blood Culture adequate volume   Culture   Final    NO GROWTH 5 DAYS Performed at [facility], [address], [place], [place] [address]    Report Status 03/24/2022 FINAL  Final  Blood culture (routine x 2)     Status: None   Collection Time: 03/19/22  7:48 PM   Specimen: BLOOD RIGHT ARM  Result Value Ref Range Status   Specimen Description BLOOD RIGHT ARM  Final   Special Requests   Final    BOTTLES DRAWN AEROBIC AND ANAEROBIC Blood Culture adequate volume   Culture   Final    NO GROWTH 5 DAYS Performed at [facility], [place] [address], [place], [place] [address]    Report Status 03/24/2022 FINAL  Final  Resp panel by RT-PCR (RSV, Flu A&B, Covid) Anterior Nasal Swab     Status: None   Collection Time: 03/19/22 10:25 PM   Specimen: Anterior Nasal Swab  Result Value Ref Range Status   SARS Coronavirus 2 by RT PCR NEGATIVE NEGATIVE Final   Influenza A by PCR NEGATIVE NEGATIVE Final   Influenza B by PCR NEGATIVE NEGATIVE Final    Comment: (NOTE) The Xpert Xpress  SARS-CoV-2/FLU/RSV plus assay is intended as an aid in the diagnosis of influenza from Nasopharyngeal swab specimens and should not be used as a sole basis for treatment. Nasal washings and aspirates are unacceptable for Xpert Xpress SARS-CoV-2/FLU/RSV testing.  Fact Sheet for Patients: EntrepreneurPulse.com.au  Fact Sheet for Healthcare Providers: IncredibleEmployment.be  This test is not yet approved or cleared by the [place] FDA and has been authorized for  detection and/or diagnosis of SARS-CoV-2 by FDA under an Emergency Use Authorization (EUA). This EUA will remain in effect (meaning this test can be used) for the duration of the COVID-19 declaration under Section 564(b)(1) of the Act, 21 U.S.C. section 360bbb-3(b)(1), unless the authorization is terminated or revoked.     Resp Syncytial Virus by PCR NEGATIVE NEGATIVE Final    Comment: (NOTE) Fact Sheet for Patients: EntrepreneurPulse.com.au  Fact Sheet for Healthcare Providers: IncredibleEmployment.be  This test is not yet approved or cleared by the [place] FDA and has been authorized for detection and/or diagnosis of SARS-CoV-2 by FDA under an Emergency Use Authorization (EUA). This EUA will remain in effect (meaning this test can be used) for the duration of the COVID-19 declaration under Section 564(b)(1) of the Act, 21 U.S.C. section 360bbb-3(b)(1), unless the authorization is terminated or revoked.  Performed at [facility], [place] [address] [place], [place] [address]   Urine Culture     Status: Abnormal   Collection Time: 03/20/22 12:45 AM   Specimen: Urine, Random  Result Value Ref Range Status   Specimen Description URINE, RANDOM  Final   Special Requests   Final    NONE Reflexed from [id number] Performed at [facility], [place] [address], [place], [place] [address]    Culture 10,000 COLONIES/mL YEAST (A)  Final   Report Status 03/21/2022 FINAL  Final  MRSA Next Gen by PCR, Nasal     Status: None   Collection Time: 03/20/22  8:15 AM   Specimen: Nasal Mucosa; Nasal Swab  Result Value Ref Range Status   MRSA by PCR Next Gen NOT DETECTED NOT DETECTED Final    Comment: (NOTE) The GeneXpert MRSA Assay (FDA approved for NASAL specimens only), is one component of a comprehensive MRSA colonization surveillance program. It is not intended to diagnose MRSA infection nor to guide or monitor treatment for MRSA  infections. Test performance is not FDA approved in patients less than 16 years old. Performed at [facility], [place] [address] [place], [place] [address]   Respiratory (~20 pathogens) panel by PCR     Status: None   Collection Time: 03/20/22  8:16 AM   Specimen: Nasopharyngeal Swab; Respiratory  Result Value Ref Range Status   Adenovirus NOT DETECTED NOT DETECTED Final   Coronavirus 229E NOT DETECTED NOT DETECTED Final    Comment: (NOTE) The Coronavirus on the Respiratory Panel, DOES NOT test for the novel  Coronavirus (2019 nCoV)    Coronavirus HKU1 NOT DETECTED NOT DETECTED Final   Coronavirus NL63 NOT DETECTED NOT DETECTED Final   Coronavirus OC43 NOT DETECTED NOT DETECTED Final   Metapneumovirus NOT DETECTED NOT DETECTED Final   Rhinovirus / Enterovirus NOT DETECTED NOT DETECTED Final   Influenza A NOT DETECTED NOT DETECTED Final   Influenza B NOT DETECTED NOT DETECTED Final   Parainfluenza Virus 1 NOT DETECTED NOT DETECTED Final   Parainfluenza Virus 2 NOT DETECTED NOT DETECTED Final   Parainfluenza Virus 3 NOT DETECTED NOT DETECTED Final   Parainfluenza Virus 4 NOT  DETECTED NOT DETECTED Final   Respiratory Syncytial Virus NOT DETECTED NOT DETECTED Final   Bordetella pertussis NOT DETECTED NOT DETECTED Final   Bordetella Parapertussis NOT DETECTED NOT DETECTED Final   Chlamydophila pneumoniae NOT DETECTED NOT DETECTED Final   Mycoplasma pneumoniae NOT DETECTED NOT DETECTED Final    Comment: Performed at [facility], [place] [address] [place], [place] [address]      Radiology Studies: DG CHEST PORT 1 VIEW  Result Date: 03/22/2022 CLINICAL DATA:  10027 Tachypnea 786.06.ICD-9-CM [id number] Pneumonia [id number] EXAM: PORTABLE CHEST 1 VIEW COMPARISON:  March 21, 2022 FINDINGS: The cardiomediastinal silhouette is unchanged in contour.RIGHT upper extremity PICC tip terminates over the inferior SVC. Atherosclerotic calcifications. Small LEFT pleural effusion. No pneumothorax.  Mildly improved aeration of the LEFT upper lung heterogeneous airspace opacity. Mildly increased confluence of the RIGHT upper lung heterogeneous airspace opacity. Similar appearance of patchy opacities at the RIGHT lung base. IMPRESSION: 1. Mildly increased confluence of the RIGHT upper lung airspace opacity. Minimally improved aeration of the LEFT upper lung airspace opacity. 2.  Small LEFT pleural effusion. Electronically Signed   By: Valentino Saxon M.D.   On: 03/22/2022 18:16      LOS: 5 days    Cordelia Poche, MD [facility] 03/24/2022, 10:16 AM   If 7PM-7AM, please contact night-coverage [email or web address]

## 2022-03-24 NOTE — Progress Notes (Signed)
Physical Therapy Treatment Patient Details Name: Melissa Leon MRN: [id number] DOB: 1945-07-27 Today's Date: 03/24/2022   History of Present Illness Pt is 77 yo female presenting with fever, weakness. Found to have pneumonia with associated respiratory failure and UTI. Pt history of spinal epidural abscess and recent back surgery within the past 3 weeks. PMH: DM II, hyponatremia, HTN    PT Comments    Pt reports fatigue from not sleeping last night and getting up and down to the bathroom through the day today. Pt O2 sats 96% on 1L. O2 removed for session and pt maintained sats >88% for >25 mins and with mobility. O2 left off and RN notified end of session. Pt performed core exercises in bed before mobilizing then ambulated 50' with RW and supervision. Pt limited by fatigue with activity. PT will continue to follow.    Recommendations for follow up therapy are one component of a multi-disciplinary discharge planning process, led by the attending physician.  Recommendations may be updated based on patient status, additional functional criteria and insurance authorization.  Follow Up Recommendations  Home health PT     Assistance Recommended at Discharge Intermittent Supervision/Assistance  Patient can return home with the following A little help with walking and/or transfers;Assistance with cooking/housework;Assist for transportation;Help with stairs or ramp for entrance   Equipment Recommendations  None recommended by PT    Recommendations for Other Services OT consult     Precautions / Restrictions Precautions Precautions: Fall;Back Precaution Booklet Issued: No Precaution Comments: pt able to keep precautions with mobility and verbalize Required Braces or Orthoses:  (no brace per doctors orders) Restrictions Weight Bearing Restrictions: No     Mobility  Bed Mobility Overal bed mobility: Needs Assistance Bed Mobility: Sit to Supine, Rolling, Sidelying to Sit Rolling:  Supervision Sidelying to sit: Supervision   Sit to supine: Supervision   General bed mobility comments: pt able to come to EOB from [facility] 9 degrees without physical assist    Transfers Overall transfer level: Needs assistance Equipment used: Rolling walker (2 wheels) Transfers: Sit to/from Stand Sit to Stand: Min guard           General transfer comment: pt able to stand safely EOB with and without RW with supervision    Ambulation/Gait Ambulation/Gait assistance: Supervision Gait Distance (Feet): 50 Feet Assistive device: Rolling walker (2 wheels) Gait Pattern/deviations: Step-through pattern, Decreased stride length Gait velocity: Decreased cadence. Gait velocity interpretation: <1.8 ft/sec, indicate of risk for recurrent falls   General Gait Details: pt able to ambulate on RA with sats in 90's, no LOB, guarded gait   Stairs             Wheelchair Mobility    Modified Rankin (Stroke Patients Only)       Balance Overall balance assessment: Needs assistance Sitting-balance support: Feet supported Sitting balance-Leahy Scale: Good     Standing balance support: Single extremity supported Standing balance-Leahy Scale: Fair Standing balance comment: pt able to maintain static standing without UE support without LOB.                            Cognition Arousal/Alertness: Awake/alert Behavior During Therapy: WFL for tasks assessed/performed Overall Cognitive Status: Within Functional Limits for tasks assessed  Exercises Other Exercises Other Exercises: low hip bridges x10. Other Exercises: SL hip bridge R and L x10    General Comments General comments (skin integrity, edema, etc.): brother present      Pertinent Vitals/Pain Pain Assessment Pain Assessment: Faces Faces Pain Scale: No hurt    Home Living                          Prior Function            PT Goals  (current goals can now be found in the care plan section) Acute Rehab PT Goals Patient Stated Goal: Return home with family support. PT Goal Formulation: With patient Time For Goal Achievement:  Potential to Achieve Goals: Good Progress towards PT goals: Progressing toward goals    Frequency    Min 3X/week      PT Plan Current plan remains appropriate    Co-evaluation              AM-PAC PT "6 Clicks" Mobility   Outcome Measure  Help needed turning from your back to your side while in a flat bed without using bedrails?: None Help needed moving from lying on your back to sitting on the side of a flat bed without using bedrails?: None Help needed moving to and from a bed to a chair (including a wheelchair)?: A Little Help needed standing up from a chair using your arms (e.g., wheelchair or bedside chair)?: A Little Help needed to walk in hospital room?: A Little Help needed climbing 3-5 steps with a railing? : A Little 6 Click Score: 20    End of Session Equipment Utilized During Treatment: Gait belt Activity Tolerance: Patient tolerated treatment well Patient left: in bed;with call bell/phone within reach;with family/visitor present Nurse Communication: Mobility status PT Visit Diagnosis: Unsteadiness on feet (R26.81)     Time: 1432-1500 PT Time Calculation (min) (ACUTE ONLY): 28 min  Charges:  $Gait Training: 8-22 mins $Therapeutic Exercise: 8-22 mins                     Leighton Roach, PT  Acute Rehab Services Secure chat preferred Office [place] 03/24/2022, 3:30 PM

## 2022-03-24 NOTE — Progress Notes (Signed)
PVR 0cc (x3)

## 2022-03-24 NOTE — Progress Notes (Incomplete)
Initial Nutrition Assessment  DOCUMENTATION CODES:      INTERVENTION:  ***   NUTRITION DIAGNOSIS:     related to   as evidenced by  .  GOAL:      MONITOR:      REASON FOR ASSESSMENT:   Malnutrition Screening Tool    ASSESSMENT:   Pt with hx of DM type 2, HTN, and prior cholecystectomy presented to ED with weakness and a fever after recent lumbar laminectomy and removal of nephrostomy tube ~ 1 month PTA. Imaging in ED suggestive of pneumonia.  ***   Average Meal Intake: 3/1-3/4: 63% intake x 3 recorded meals  Nutritionally Relevant Medications: Scheduled Meds:  insulin aspart  0-15 Units Subcutaneous TID WC   levofloxacin  750 mg Oral Daily   Continuous Infusions:  vancomycin 1,500 mg (03/23/22 2218)   PRN Meds: ondansetron  Labs Reviewed  NUTRITION - FOCUSED PHYSICAL EXAM: {RD Focused Exam List:21252}  Diet Order:   Diet Order             Diet Carb Modified Fluid consistency: Thin; Room service appropriate? Yes  Diet effective now                   EDUCATION NEEDS:      Skin:  Skin Assessment: Reviewed RN Assessment  Last BM:  3/4  Height:   Ht Readings from Last 1 Encounters:  03/19/22 '5\' 7"'$  (1.702 m)    Weight:   Wt Readings from Last 1 Encounters:  03/22/22 76.9 kg    Ideal Body Weight:  61.4 kg  BMI:  Body mass index is 26.55 kg/m.  Estimated Nutritional Needs:   Kcal:  1700-1900 kcal/d  Protein:  85-100 g/d  Fluid:  >/=1.8L/d    Ranell Patrick, RD, LDN Clinical Dietitian RD pager # available in [facility]  After hours/weekend pager # available in [facility]

## 2022-03-25 DIAGNOSIS — J9601 Acute respiratory failure with hypoxia: Secondary | ICD-10-CM | POA: Diagnosis not present

## 2022-03-25 DIAGNOSIS — J189 Pneumonia, unspecified organism: Secondary | ICD-10-CM | POA: Diagnosis not present

## 2022-03-25 DIAGNOSIS — E119 Type 2 diabetes mellitus without complications: Secondary | ICD-10-CM

## 2022-03-25 DIAGNOSIS — G061 Intraspinal abscess and granuloma: Secondary | ICD-10-CM | POA: Diagnosis not present

## 2022-03-25 LAB — CBC
HCT: 26.5 % — ABNORMAL LOW (ref 36.0–46.0)
Hemoglobin: 8.5 g/dL — ABNORMAL LOW (ref 12.0–15.0)
MCH: 28.7 pg (ref 26.0–34.0)
MCHC: 32.1 g/dL (ref 30.0–36.0)
MCV: 89.5 fL (ref 80.0–100.0)
Platelets: 428 10*3/uL — ABNORMAL HIGH (ref 150–400)
RBC: 2.96 MIL/uL — ABNORMAL LOW (ref 3.87–5.11)
RDW: 14.6 % (ref 11.5–15.5)
WBC: 14.4 10*3/uL — ABNORMAL HIGH (ref 4.0–10.5)
nRBC: 0 % (ref 0.0–0.2)

## 2022-03-25 LAB — BASIC METABOLIC PANEL
Anion gap: 8 (ref 5–15)
BUN: 37 mg/dL — ABNORMAL HIGH (ref 8–23)
CO2: 25 mmol/L (ref 22–32)
Calcium: 8.9 mg/dL (ref 8.9–10.3)
Chloride: 102 mmol/L (ref 98–111)
Creatinine, Ser: 1.01 mg/dL — ABNORMAL HIGH (ref 0.44–1.00)
GFR, Estimated: 58 mL/min — ABNORMAL LOW (ref >60)
Glucose, Bld: 180 mg/dL — ABNORMAL HIGH (ref 70–99)
Potassium: 3.6 mmol/L (ref 3.5–5.1)
Sodium: 135 mmol/L (ref 135–145)

## 2022-03-25 LAB — GLUCOSE, CAPILLARY: Glucose-Capillary: 172 mg/dL — ABNORMAL HIGH (ref 70–99)

## 2022-03-25 MED ORDER — DAPTOMYCIN IV (FOR PTA / DISCHARGE USE ONLY): 650.0000 mg | INTRAVENOUS | 0 refills | Status: DC

## 2022-03-25 MED ORDER — FLAVOXATE HCL 100 MG PO TABS: 100.0000 mg | ORAL_TABLET | Freq: Three times a day (TID) | ORAL | 0 refills | Status: DC | PRN

## 2022-03-25 NOTE — Plan of Care (Signed)
Problem: Education: Goal: Ability to verbalize activity precautions or restrictions will improve Outcome: Adequate for Discharge Goal: Knowledge of the prescribed therapeutic regimen will improve Outcome: Adequate for Discharge Goal: Understanding of discharge needs will improve Outcome: Adequate for Discharge   Problem: Activity: Goal: Ability to avoid complications of mobility impairment will improve Outcome: Adequate for Discharge Goal: Ability to tolerate increased activity will improve Outcome: Adequate for Discharge Goal: Will remain free from falls Outcome: Adequate for Discharge   Problem: Bowel/Gastric: Goal: Gastrointestinal status for postoperative course will improve Outcome: Adequate for Discharge   Problem: Clinical Measurements: Goal: Ability to maintain clinical measurements within normal limits will improve Outcome: Adequate for Discharge Goal: Postoperative complications will be avoided or minimized Outcome: Adequate for Discharge Goal: Diagnostic test results will improve Outcome: Adequate for Discharge   Problem: Pain Management: Goal: Pain level will decrease Outcome: Adequate for Discharge   Problem: Skin Integrity: Goal: Will show signs of wound healing Outcome: Adequate for Discharge   Problem: Health Behavior/Discharge Planning: Goal: Identification of resources available to assist in meeting health care needs will improve Outcome: Adequate for Discharge   Problem: Bladder/Genitourinary: Goal: Urinary functional status for postoperative course will improve Outcome: Adequate for Discharge   Problem: Education: Goal: Knowledge of General Education information will improve Description: Including pain rating scale, medication(s)/side effects and non-pharmacologic comfort measures Outcome: Adequate for Discharge   Problem: Health Behavior/Discharge Planning: Goal: Ability to manage health-related needs will improve Outcome: Adequate for  Discharge   Problem: Clinical Measurements: Goal: Ability to maintain clinical measurements within normal limits will improve Outcome: Adequate for Discharge Goal: Will remain free from infection Outcome: Adequate for Discharge Goal: Diagnostic test results will improve Outcome: Adequate for Discharge Goal: Respiratory complications will improve Outcome: Adequate for Discharge Goal: Cardiovascular complication will be avoided Outcome: Adequate for Discharge   Problem: Activity: Goal: Risk for activity intolerance will decrease Outcome: Adequate for Discharge   Problem: Nutrition: Goal: Adequate nutrition will be maintained Outcome: Adequate for Discharge   Problem: Coping: Goal: Level of anxiety will decrease Outcome: Adequate for Discharge   Problem: Elimination: Goal: Will not experience complications related to bowel motility Outcome: Adequate for Discharge Goal: Will not experience complications related to urinary retention Outcome: Adequate for Discharge   Problem: Pain Managment: Goal: General experience of comfort will improve Outcome: Adequate for Discharge   Problem: Safety: Goal: Ability to remain free from injury will improve Outcome: Adequate for Discharge   Problem: Skin Integrity: Goal: Risk for impaired skin integrity will decrease Outcome: Adequate for Discharge   Problem: Education: Goal: Ability to describe self-care measures that may prevent or decrease complications (Diabetes Survival Skills Education) will improve Outcome: Adequate for Discharge Goal: Individualized Educational Video(s) Outcome: Adequate for Discharge   Problem: Coping: Goal: Ability to adjust to condition or change in health will improve Outcome: Adequate for Discharge   Problem: Fluid Volume: Goal: Ability to maintain a balanced intake and output will improve Outcome: Adequate for Discharge   Problem: Health Behavior/Discharge Planning: Goal: Ability to identify and  utilize available resources and services will improve Outcome: Adequate for Discharge Goal: Ability to manage health-related needs will improve Outcome: Adequate for Discharge   Problem: Metabolic: Goal: Ability to maintain appropriate glucose levels will improve Outcome: Adequate for Discharge   Problem: Nutritional: Goal: Maintenance of adequate nutrition will improve Outcome: Adequate for Discharge Goal: Progress toward achieving an optimal weight will improve Outcome: Adequate for Discharge   Problem: Skin Integrity: Goal: Risk for impaired  skin integrity will decrease Outcome: Adequate for Discharge   Problem: Tissue Perfusion: Goal: Adequacy of tissue perfusion will improve Outcome: Adequate for Discharge   Problem: Acute Rehab PT Goals(only PT should resolve) Goal: Patient Will Transfer Sit To/From Stand Outcome: Adequate for Discharge Goal: Pt Will Transfer Bed To Chair/Chair To Bed Outcome: Adequate for Discharge Goal: Pt Will Ambulate Outcome: Adequate for Discharge Goal: Pt Will Go Up/Down Stairs Outcome: Adequate for Discharge   Problem: Acute Rehab OT Goals (only OT should resolve) Goal: Pt. Will Perform Grooming Outcome: Adequate for Discharge Goal: Pt. Will Transfer To Toilet Outcome: Adequate for Discharge

## 2022-03-25 NOTE — Discharge Summary (Signed)
Physician Discharge Summary  Melissa Leon [id number] DOB: May 31, 1945 DOA: 03/19/2022  PCP: Katherina Mires, MD  Admit date: 03/19/2022 Discharge date: 03/25/2022  Admitted From: Home  Discharge disposition: Home   Recommendations for Outpatient Follow-Up:   Follow up with your primary care provider in one week.  Check CBC, BMP, magnesium in the next visit Patient will need to follow-up with urology Dr. Abner Greenspan as outpatient. Follow-up with neurosurgery as outpatient as has been scheduled. Follow-up with ID, Dr. Charolotte Eke on 03/31/2022 for antibiotic management.  Discharge Diagnosis:   Principal Problem:   Acute cystitis without hematuria Active Problems:   Sepsis without acute organ dysfunction (HCC)   Pneumonia due to infectious organism   Acute respiratory failure with hypoxia (HCC)   Bacterial spinal epidural abscess - MRSA   DM2 (diabetes mellitus, type 2) ([place])   Essential hypertension   Ureteral obstruction, right   S/P lumbar laminectomy   Hyponatremia   Urinary tract infection without hematuria   Discharge Condition: Improved.  Diet recommendation: Low sodium, heart healthy.  Carbohydrate-modified.    Wound care: None.  Code status: Full.  History of Present Illness:   Melissa Leon is a 77 y.o. female with past medical history of diabetes mellitus and hypertension who was recently diagnosed with epidural abscess on daptomycin presented to hospital with fever and weakness.  Patient was noted to have pneumonia with respiratory failure and possible UTI and was put on broad-spectrum antibiotic.   Hospital Course:   Following conditions were addressed during hospitalization as listed below,  Community acquired pneumonia Patient was seen by infectious disease during her hospitalization.  Patient was empirically on vancomycin, azithromycin and cefepime initially and had persistent leukocytosis.  Chest x-ray 03/21/2022 showed worsening infiltrates and chest  x-ray on 03/22/2022 showed improved status.  Transition to Levaquin which she has completed.   Mild leukocytosis at 14.4.  Blood cultures have been negative in 5 days.  Patient will continue daptomycin on discharge and has an appointment with ID for follow-up.   Acute respiratory failure with hypoxia Secondary to pneumonia.  Patient initially required up to 10 L of oxygen.  Clinically and x-ray chest with improvement. Oxygen has been weaned down to room air and is saturating around 92%.  Will continue incentive spirometry on discharge.  Patient has completed course of antibiotic for pneumonia.   Urinary retention Complicated by recent spinal surgery for epidural abscess. Associated paresthesias. At home, patient was scheduling toileting.  Foley catheter was placed in during hospitalization.  Has been removed at this time.  Plan is to discharge home without Foley catheter with scheduled toilet breaks.  Patient will need to follow-up with urology Dr. Abner Greenspan as outpatient for stent management..   Acute cystitis without hematuria Analysis showed UTI with previous history of ureteral stent. Urine culture significant for 10,000 colonies/mL of yeast.  No further treatment required.  Will need to follow-up with urology as outpatient.   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 Daptomycin transitioned to Vancomycin this admission for coverage of pneumonia.  Plan is to transition back to daptomycin on discharge, end of treatment 04/12/2022.  No surgical site infection.  Previous provider had spoken with  Dr. Kathyrn Sheriff, neurosurgery.  Culture with positive MRSA.  Patient does have an appointment to follow-up with Dr. [place] Bali infectious disease on 03/31/2022.   Sepsis Present on admission.  secondary to  community-acquired pneumonia and UTI.  Blood cultures and urine cultures  negative so far except for yeast in urine.     History of right ureteral obstruction Patient is s/p right ureteral stent after recent right nephrostomy tube removal. CT abdomen significant for dilation of right pelvis.  Previous provider had spoken with urology Dr. Alinda Money  who states dilation is not unexpected and recommended continued treatment of UTI and to re-consult if patient develops associated AKI.  At this time creatinine is 1.0 and is improving.  Will need to follow-up with urology as outpatient.  Continue Flomax   Primary hypertension Patient is on hydrochlorothiazide, losartan, amlodipine at home.  Will be resumed on discharge.  Diabetes mellitus type 2 Controlled. Hemoglobin A1C of 5.6%.  Patient is on metformin at home.  Resume on discharge.   Hyponatremia Improved.  Latest sodium of 135.  Deconditioning, debility.  Patient has been seen by physical therapy recommend home health PT on discharge will continue PICC line with antibiotics on discharge.  Disposition.  At this time, patient is stable for disposition home with home health.  Patient will also follow-up with outpatient neurosurgery, ID, PCP and urology. Spoke with the patient's daughter on the phone regarding disposition.  Medical Consultants:   PCCM Infectious disease  Procedures:    Foley catheter insertion and removal Subjective:   Today, patient and examined at bedside.  Feels okay.  Was able to urinate by herself without any issues.  Feels at her baseline.  Denies any increasing dyspnea or shortness of breath.  Discharge Exam:   Vitals:   03/25/22 0356 03/25/22 0756  BP: 122/65 131/63  Pulse: 90 89  Resp: 18 18  Temp: 98.3 F (36.8 C) 98.4 F (36.9 C)  SpO2: 92% 94%   Vitals:   03/24/22 2348 03/25/22 0335 03/25/22 0356 03/25/22 0756  BP: 139/69  122/65 131/63  Pulse: 94  90 89  Resp: '18  18 18  '$ Temp: 98.1 F (36.7 C)  98.3 F (36.8 C) 98.4 F (36.9 C)  TempSrc: Oral  Oral Oral  SpO2: 91%   92% 94%  Weight:  71.3 kg    Height:      Body mass index is 24.61 kg/m.  General: Alert awake, not in obvious distress HENT: pupils equally reacting to light,  No scleral pallor or icterus noted. Oral mucosa is moist.  Chest:  Clear breath sounds.  Diminished breath sounds bilaterally. No crackles or wheezes.  CVS: S1 &S2 heard. No murmur.  Regular rate and rhythm. Abdomen: Soft, nontender, nondistended.  Bowel sounds are heard.   Extremities: No cyanosis, clubbing or edema.  Peripheral pulses are palpable.  Right upper extremity PICC line in place. Psych: Alert, awake and oriented, normal mood CNS:  No cranial nerve deficits.  Moves extremities. Skin: Warm and dry.  No rashes noted.  The results of significant diagnostics from this hospitalization (including imaging, microbiology, ancillary and laboratory) are listed below for reference.     Diagnostic Studies:   DG Chest 1 View  Result Date: 03/19/2022 CLINICAL DATA:  Weakness EXAM: CHEST  1 VIEW COMPARISON:  Chest x-ray 12/10/2018 FINDINGS: There is a moderate amount of patchy airspace consolidation in the left mid and upper lung. There is also faint patchy airspace disease in the right lung base. There is no pleural effusion or pneumothorax. Cardiomediastinal silhouette is within normal limits. Right-sided central venous catheter tip projects over the SVC. No acute fractures are seen. IMPRESSION: Bilateral airspace disease, left greater than  right. Findings are concerning for multifocal pneumonia. Follow-up PA and lateral chest x-ray recommended in 4-6 weeks to confirm resolution. Electronically Signed   By: Ronney Asters M.D.   On: 03/19/2022 20:41   CT ABDOMEN PELVIS WO CONTRAST  Result Date: 03/19/2022 CLINICAL DATA:  Recent procedures of nephrostomy tube insertion and removal and removal of spinal abscess. Presenting with fever and weakness. Currently receiving daptomycin for infection. EXAM: CT ABDOMEN AND PELVIS WITHOUT CONTRAST  TECHNIQUE: Multidetector CT imaging of the abdomen and pelvis was performed following the standard protocol without IV contrast. RADIATION DOSE REDUCTION: This exam was performed according to the departmental dose-optimization program which includes automated exposure control, adjustment of the mA and/or kV according to patient size and/or use of iterative reconstruction technique. COMPARISON:  CT 02/25/2022 FINDINGS: Lower chest: Patchy airspace opacities in the bilateral lower lung suspicious for pneumonia. Hepatobiliary: Unremarkable liver and biliary tree. Cholecystectomy. Pancreas: Unremarkable. Spleen: Unremarkable. Adrenals/Urinary Tract: Normal adrenal glands. Ureteral stent in the right ureter. Interval removal of the right nephrostomy tube with new dilation of the right renal pelvis. No urinary calculi. Unremarkable bladder. Stomach/Bowel: Normal caliber large and small bowel. Colonic diverticulosis without diverticulitis. Moderate colonic stool load. The appendix is not visualized. Vascular/Lymphatic: Aortic atherosclerotic calcification. No lymphadenopathy. Reproductive: Unremarkable uterus and ovaries. Other: No free intraperitoneal fluid or air. Musculoskeletal: No acute osseous abnormality. IMPRESSION: 1. Patchy airspace opacities in the bilateral lower lung suspicious for pneumonia. 2. Interval removal of the right nephrostomy tube with new development of dilation of the right pelvis. Recommend correlation for proper functioning of the right ureteral stent. Aortic Atherosclerosis (ICD10-I70.0). Electronically Signed   By: Placido Sou M.D.   On: 03/19/2022 20:27     Labs:   Basic Metabolic Panel: Recent Labs  Lab 03/20/22 0500 03/21/22 0500 03/22/22 0535 03/23/22 0535 03/25/22 0340  NA 131* 131* 132* 133* 135  K 3.2* 3.5 3.3* 3.6 3.6  CL 99 98 97* 98 102  CO2 '22 24 25 23 25  '$ GLUCOSE 140* 157* 129* 236* 180*  BUN '17 20 19 '$ 28* 37*  CREATININE 0.92 0.96 0.83 0.78 1.01*  CALCIUM  8.0* 8.1* 8.2* 8.5* 8.9  MG 1.8  --   --   --   --    GFR Estimated Creatinine Clearance: 46.1 mL/min (A) (by C-G formula based on SCr of 1.01 mg/dL (H)). Liver Function Tests: Recent Labs  Lab 03/19/22 1951 03/20/22 0500 03/22/22 0535  AST 30 27 33  ALT '25 21 29  '$ ALKPHOS 113 93 101  BILITOT 1.2 1.4* 1.2  PROT 6.9 5.7* 5.8*  ALBUMIN 3.0* 2.3* 1.9*   No results for input(s): "LIPASE", "AMYLASE" in the last 168 hours. No results for input(s): "AMMONIA" in the last 168 hours. Coagulation profile No results for input(s): "INR", "PROTIME" in the last 168 hours.  CBC: Recent Labs  Lab 03/19/22 2219 03/20/22 0314 03/20/22 0500 03/21/22 0500 03/22/22 0535 03/23/22 0535 03/25/22 0340  WBC 14.6* 14.3* 12.5* 14.3* 13.3* 7.4 14.4*  NEUTROABS 11.8* 12.5* 10.8*  --  11.4*  --   --   HGB 9.6* 9.3* 8.5* 8.7* 8.5* 8.5* 8.5*  HCT 29.2* 27.9* 25.8* 25.3* 25.0* 25.4* 26.5*  MCV 90.7 90.3 90.8 87.8 87.7 87.3 89.5  PLT 288 252 258 271 269 284 428*   Cardiac Enzymes: No results for input(s): "CKTOTAL", "CKMB", "CKMBINDEX", "TROPONINI" in the last 168 hours. BNP: Invalid input(s): "POCBNP" CBG: Recent Labs  Lab 03/24/22 0634 03/24/22 1138 03/24/22 1649 03/24/22 2116 03/25/22  0630  GLUCAP 216* 141* 97 185* 172*   D-Dimer No results for input(s): "DDIMER" in the last 72 hours. Hgb A1c No results for input(s): "HGBA1C" in the last 72 hours. Lipid Profile No results for input(s): "CHOL", "HDL", "LDLCALC", "TRIG", "CHOLHDL", "LDLDIRECT" in the last 72 hours. Thyroid function studies No results for input(s): "TSH", "T4TOTAL", "T3FREE", "THYROIDAB" in the last 72 hours.  Invalid input(s): "FREET3" Anemia work up No results for input(s): "VITAMINB12", "FOLATE", "FERRITIN", "TIBC", "IRON", "RETICCTPCT" in the last 72 hours. Microbiology Recent Results (from the past 240 hour(s))  Blood culture (routine x 2)     Status: None   Collection Time: 03/19/22  7:35 PM   Specimen: BLOOD  RIGHT HAND  Result Value Ref Range Status   Specimen Description BLOOD RIGHT HAND  Final   Special Requests   Final    BOTTLES DRAWN AEROBIC ONLY Blood Culture adequate volume   Culture   Final    NO GROWTH 5 DAYS Performed at [facility], [address] [place], [place] [address]    Report Status 03/24/2022 FINAL  Final  Blood culture (routine x 2)     Status: None   Collection Time: 03/19/22  7:48 PM   Specimen: BLOOD RIGHT ARM  Result Value Ref Range Status   Specimen Description BLOOD RIGHT ARM  Final   Special Requests   Final    BOTTLES DRAWN AEROBIC AND ANAEROBIC Blood Culture adequate volume   Culture   Final    NO GROWTH 5 DAYS Performed at [facility], [place] [address], [place], [place] [address]    Report Status 03/24/2022 FINAL  Final  Resp panel by RT-PCR (RSV, Flu A&B, Covid) Anterior Nasal Swab     Status: None   Collection Time: 03/19/22 10:25 PM   Specimen: Anterior Nasal Swab  Result Value Ref Range Status   SARS Coronavirus 2 by RT PCR NEGATIVE NEGATIVE Final   Influenza A by PCR NEGATIVE NEGATIVE Final   Influenza B by PCR NEGATIVE NEGATIVE Final    Comment: (NOTE) The Xpert Xpress SARS-CoV-2/FLU/RSV plus assay is intended as an aid in the diagnosis of influenza from Nasopharyngeal swab specimens and should not be used as a sole basis for treatment. Nasal washings and aspirates are unacceptable for Xpert Xpress SARS-CoV-2/FLU/RSV testing.  Fact Sheet for Patients: EntrepreneurPulse.com.au  Fact Sheet for Healthcare Providers: IncredibleEmployment.be  This test is not yet approved or cleared by the [place] FDA and has been authorized for detection and/or diagnosis of SARS-CoV-2 by FDA under an Emergency Use Authorization (EUA). This EUA will remain in effect (meaning this test can be used) for the duration of the COVID-19 declaration under Section 564(b)(1) of the Act, 21 U.S.C. section  360bbb-3(b)(1), unless the authorization is terminated or revoked.     Resp Syncytial Virus by PCR NEGATIVE NEGATIVE Final    Comment: (NOTE) Fact Sheet for Patients: EntrepreneurPulse.com.au  Fact Sheet for Healthcare Providers: IncredibleEmployment.be  This test is not yet approved or cleared by the [place] FDA and has been authorized for detection and/or diagnosis of SARS-CoV-2 by FDA under an Emergency Use Authorization (EUA). This EUA will remain in effect (meaning this test can be used) for the duration of the COVID-19 declaration under Section 564(b)(1) of the Act, 21 U.S.C. section 360bbb-3(b)(1), unless the authorization is terminated or revoked.  Performed at [facility], [place] [address] [place], [place] [address]   Urine Culture  Status: Abnormal   Collection Time: 03/20/22 12:45 AM   Specimen: Urine, Random  Result Value Ref Range Status   Specimen Description URINE, RANDOM  Final   Special Requests   Final    NONE Reflexed from [id number] Performed at [facility], [address], [place], [place] [address]    Culture 10,000 COLONIES/mL YEAST (A)  Final   Report Status 03/21/2022 FINAL  Final  MRSA Next Gen by PCR, Nasal     Status: None   Collection Time: 03/20/22  8:15 AM   Specimen: Nasal Mucosa; Nasal Swab  Result Value Ref Range Status   MRSA by PCR Next Gen NOT DETECTED NOT DETECTED Final    Comment: (NOTE) The GeneXpert MRSA Assay (FDA approved for NASAL specimens only), is one component of a comprehensive MRSA colonization surveillance program. It is not intended to diagnose MRSA infection nor to guide or monitor treatment for MRSA infections. Test performance is not FDA approved in patients less than 54 years old. Performed at [facility], [place] [address], [place], [place] [address]   Respiratory (~20 pathogens) panel by PCR     Status: None   Collection Time: 03/20/22  8:16 AM    Specimen: Nasopharyngeal Swab; Respiratory  Result Value Ref Range Status   Adenovirus NOT DETECTED NOT DETECTED Final   Coronavirus 229E NOT DETECTED NOT DETECTED Final    Comment: (NOTE) The Coronavirus on the Respiratory Panel, DOES NOT test for the novel  Coronavirus (2019 nCoV)    Coronavirus HKU1 NOT DETECTED NOT DETECTED Final   Coronavirus NL63 NOT DETECTED NOT DETECTED Final   Coronavirus OC43 NOT DETECTED NOT DETECTED Final   Metapneumovirus NOT DETECTED NOT DETECTED Final   Rhinovirus / Enterovirus NOT DETECTED NOT DETECTED Final   Influenza A NOT DETECTED NOT DETECTED Final   Influenza B NOT DETECTED NOT DETECTED Final   Parainfluenza Virus 1 NOT DETECTED NOT DETECTED Final   Parainfluenza Virus 2 NOT DETECTED NOT DETECTED Final   Parainfluenza Virus 3 NOT DETECTED NOT DETECTED Final   Parainfluenza Virus 4 NOT DETECTED NOT DETECTED Final   Respiratory Syncytial Virus NOT DETECTED NOT DETECTED Final   Bordetella pertussis NOT DETECTED NOT DETECTED Final   Bordetella Parapertussis NOT DETECTED NOT DETECTED Final   Chlamydophila pneumoniae NOT DETECTED NOT DETECTED Final   Mycoplasma pneumoniae NOT DETECTED NOT DETECTED Final    Comment: Performed at [facility], [place]. [address] [place], [place] [address]     Discharge Instructions:   Discharge Instructions     Call MD for:  persistant nausea and vomiting   Complete by: As directed    Call MD for:  severe uncontrolled pain   Complete by: As directed    Call MD for:  temperature >100.4   Complete by: As directed    Diet - low sodium heart healthy   Complete by: As directed    Diet Carb Modified   Complete by: As directed    Discharge instructions   Complete by: As directed    Follow-up with your primary care physician in 1 week.  Check blood work at that time.  Follow-up with infectious disease doctor Manandhar as has been scheduled.  Follow-up with urology Dr Abner Greenspan as outpatient as has been scheduled.   Continue antibiotics as has been planned.  Follow-up with Dr. Ronnald Ramp neurosurgery as has been scheduled by the clinic in a month.  Seek medical attention for worsening symptoms.   Increase  activity slowly   Complete by: As directed    No wound care   Complete by: As directed       Allergies as of 03/25/2022       Reactions   Choline Fenofibrate Other (See Comments)   ELEVATED LFT.    Penicillins Hives   Ozempic (0.25 Or 0.5 Mg-dose) [semaglutide(0.25 Or 0.'5mg'$ -dos)] Nausea Only   Statins Other (See Comments)   Aches, pain        Medication List     STOP taking these medications    diclofenac Sodium 1 % Gel Commonly known as: VOLTAREN   docusate sodium 100 MG capsule Commonly known as: Colace   oxyCODONE-acetaminophen 5-325 MG tablet Commonly known as: Percocet   polyethylene glycol powder 17 GM/SCOOP powder Commonly known as: GLYCOLAX/MIRALAX   senna 8.6 MG Tabs tablet Commonly known as: SENOKOT       TAKE these medications    acetaminophen 650 MG CR tablet Commonly known as: TYLENOL Take 1,300 mg by mouth daily as needed for pain.   amLODipine 5 MG tablet Commonly known as: NORVASC Take 5 mg by mouth at bedtime.   aspirin 81 MG chewable tablet Chew 81 mg by mouth at bedtime.   daptomycin  IVPB Commonly known as: CUBICIN Inject 650 mg into the vein daily for 20 days. Indication:  MRSA epidural abscess First Dose: Yes Last Day of Therapy:  04/12/22 Labs - Once weekly:  CBC/D, BMP, and CPK Labs - Every other week:  ESR and CRP Method of administration: IV Push Pull PICC line at the completion of IV antibiotics Method of administration may be changed at the discretion of home infusion pharmacist based upon assessment of the patient and/or caregiver's ability to self-administer the medication ordered.   flavoxATE 100 MG tablet Commonly known as: URISPAS Take 1 tablet (100 mg total) by mouth 3 (three) times daily as needed for bladder spasms.    glimepiride 4 MG tablet Commonly known as: AMARYL Take 4 mg by mouth daily.   hydrochlorothiazide 25 MG tablet Commonly known as: HYDRODIURIL Take 25 mg by mouth every morning.   losartan 100 MG tablet Commonly known as: COZAAR Take 100 mg by mouth in the morning.   metFORMIN 500 MG 24 hr tablet Commonly known as: GLUCOPHAGE-XR Take 1,000 mg by mouth 2 (two) times daily with a meal.        Follow-up Information     Katherina Mires, MD Follow up in 1 week(s).   Specialty: Family Medicine Contact information: [place] [address] [phone]         Rosiland Oz, MD. Go on 03/31/2022.   Specialty: Infectious Diseases Contact information: [address] [place] [address] [phone]         Raynelle Bring, MD Follow up.   Specialty: Urology Contact information: [address] [place] [address] [phone]         Consuella Lose, MD Follow up.   Specialty: Neurosurgery Contact information: [address] [place] [address] [phone]         [facility] Follow up.   Specialty: [place] Why: The home health agency will contact you for the next home visit Contact information: [place] [address] [phone]         [facility] Follow up.   Why: Supplies the IV antibiotics Contact information: [phone]  Time coordinating discharge: 39 minutes  Signed:  Weston Kallman  [facility] 03/25/2022, 10:13 AM

## 2022-03-25 NOTE — TOC Transition Note (Signed)
Transition of Care [facility]) - CM/SW Discharge Note   Patient Details  Name: Melissa Leon MRN: [id number] Date of Birth: 1945-11-26  Transition of Care [facility]) CM/SW Contact:  Pollie Friar, RN Phone Number: 03/25/2022, 10:09 AM   Clinical Narrative:    Pt is discharging home with resumption of home health services through [facility]. Information on the AVS. Cory with [facility] aware of d/c. Pam with [facility] (supplies the IV abx) also aware of d/c.  Pt has support at home with her brother and daughter.  Pt has transport home.   Final next level of care: Home w Home Health Services Barriers to Discharge: No Barriers Identified   Patient Goals and CMS Choice CMS Medicare.gov Compare Post Acute Care list provided to:: Patient    Discharge Placement                         Discharge Plan and Services Additional resources added to the After Visit Summary for     Discharge Planning Services: CM Consult Post Acute Care Choice: Home Health                    HH Arranged: RN, PT, OT, Nurse's Aide [facility] Agency: [place] Date [facility] Agency Contacted: 03/25/22   Representative spoke with at [place]: [place] Determinants of Health ([place]) Interventions SDOH Screenings   Food Insecurity: No Food Insecurity (03/20/2022)  Housing: Low Risk  (03/20/2022)  Transportation Needs: No Transportation Needs (03/20/2022)  Utilities: Not At Risk (03/20/2022)  Tobacco Use: Medium Risk (03/19/2022)     Readmission Risk Interventions    01/06/2022    9:26 AM  Readmission Risk Prevention Plan  Transportation Screening Complete  PCP or Specialist Appt within 5-7 Days Complete  Home Care Screening Complete  Medication Review (RN CM) Complete

## 2022-03-25 NOTE — Progress Notes (Signed)
Mobility Specialist: Progress Note   03/25/22 1054  Mobility  Activity Refused mobility   Pt refused mobility stating she is getting ready to discharge.  [place] Amahd Morino Mobility Specialist Please contact via SecureChat or Rehab office at [phone]

## 2022-03-25 NOTE — Progress Notes (Signed)
Patient and her spouse and daughter were given discharge instructions and stated understanding.  All HH and infusion companies have been set up and patient has their numbers.

## 2022-03-30 ENCOUNTER — Emergency Department (HOSPITAL_COMMUNITY): Payer: Medicare HMO

## 2022-03-30 ENCOUNTER — Inpatient Hospital Stay (HOSPITAL_COMMUNITY)
Admission: EM | Admit: 2022-03-30 | Discharge: 2022-04-20 | DRG: 870 | Disposition: E | Payer: Medicare HMO | Attending: Critical Care Medicine | Admitting: Critical Care Medicine

## 2022-03-30 DIAGNOSIS — N179 Acute kidney failure, unspecified: Secondary | ICD-10-CM | POA: Diagnosis not present

## 2022-03-30 DIAGNOSIS — I2510 Atherosclerotic heart disease of native coronary artery without angina pectoris: Secondary | ICD-10-CM | POA: Diagnosis present

## 2022-03-30 DIAGNOSIS — N1330 Unspecified hydronephrosis: Secondary | ICD-10-CM | POA: Diagnosis present

## 2022-03-30 DIAGNOSIS — R34 Anuria and oliguria: Secondary | ICD-10-CM | POA: Diagnosis present

## 2022-03-30 DIAGNOSIS — T368X5A Adverse effect of other systemic antibiotics, initial encounter: Secondary | ICD-10-CM | POA: Diagnosis present

## 2022-03-30 DIAGNOSIS — Z888 Allergy status to other drugs, medicaments and biological substances status: Secondary | ICD-10-CM

## 2022-03-30 DIAGNOSIS — R0902 Hypoxemia: Secondary | ICD-10-CM | POA: Diagnosis present

## 2022-03-30 DIAGNOSIS — K5900 Constipation, unspecified: Secondary | ICD-10-CM | POA: Diagnosis not present

## 2022-03-30 DIAGNOSIS — I110 Hypertensive heart disease with heart failure: Secondary | ICD-10-CM | POA: Diagnosis present

## 2022-03-30 DIAGNOSIS — J9601 Acute respiratory failure with hypoxia: Secondary | ICD-10-CM | POA: Diagnosis not present

## 2022-03-30 DIAGNOSIS — E1165 Type 2 diabetes mellitus with hyperglycemia: Secondary | ICD-10-CM | POA: Diagnosis not present

## 2022-03-30 DIAGNOSIS — G062 Extradural and subdural abscess, unspecified: Secondary | ICD-10-CM

## 2022-03-30 DIAGNOSIS — Z960 Presence of urogenital implants: Secondary | ICD-10-CM | POA: Diagnosis present

## 2022-03-30 DIAGNOSIS — G061 Intraspinal abscess and granuloma: Secondary | ICD-10-CM | POA: Diagnosis present

## 2022-03-30 DIAGNOSIS — A419 Sepsis, unspecified organism: Principal | ICD-10-CM | POA: Diagnosis present

## 2022-03-30 DIAGNOSIS — R6520 Severe sepsis without septic shock: Secondary | ICD-10-CM | POA: Diagnosis present

## 2022-03-30 DIAGNOSIS — E871 Hypo-osmolality and hyponatremia: Secondary | ICD-10-CM | POA: Diagnosis present

## 2022-03-30 DIAGNOSIS — Z515 Encounter for palliative care: Secondary | ICD-10-CM

## 2022-03-30 DIAGNOSIS — Z9911 Dependence on respirator [ventilator] status: Secondary | ICD-10-CM | POA: Diagnosis not present

## 2022-03-30 DIAGNOSIS — E440 Moderate protein-calorie malnutrition: Secondary | ICD-10-CM | POA: Diagnosis present

## 2022-03-30 DIAGNOSIS — I82461 Acute embolism and thrombosis of right calf muscular vein: Secondary | ICD-10-CM | POA: Diagnosis present

## 2022-03-30 DIAGNOSIS — L8990 Pressure ulcer of unspecified site, unspecified stage: Secondary | ICD-10-CM | POA: Insufficient documentation

## 2022-03-30 DIAGNOSIS — J8282 Acute eosinophilic pneumonia: Secondary | ICD-10-CM | POA: Diagnosis present

## 2022-03-30 DIAGNOSIS — E785 Hyperlipidemia, unspecified: Secondary | ICD-10-CM | POA: Diagnosis present

## 2022-03-30 DIAGNOSIS — Z7984 Long term (current) use of oral hypoglycemic drugs: Secondary | ICD-10-CM

## 2022-03-30 DIAGNOSIS — Z7982 Long term (current) use of aspirin: Secondary | ICD-10-CM

## 2022-03-30 DIAGNOSIS — Z87891 Personal history of nicotine dependence: Secondary | ICD-10-CM

## 2022-03-30 DIAGNOSIS — M7989 Other specified soft tissue disorders: Secondary | ICD-10-CM | POA: Diagnosis not present

## 2022-03-30 DIAGNOSIS — I5031 Acute diastolic (congestive) heart failure: Secondary | ICD-10-CM | POA: Diagnosis not present

## 2022-03-30 DIAGNOSIS — F32A Depression, unspecified: Secondary | ICD-10-CM | POA: Diagnosis present

## 2022-03-30 DIAGNOSIS — A4902 Methicillin resistant Staphylococcus aureus infection, unspecified site: Secondary | ICD-10-CM | POA: Diagnosis not present

## 2022-03-30 DIAGNOSIS — J80 Acute respiratory distress syndrome: Secondary | ICD-10-CM | POA: Diagnosis present

## 2022-03-30 DIAGNOSIS — L89151 Pressure ulcer of sacral region, stage 1: Secondary | ICD-10-CM | POA: Diagnosis present

## 2022-03-30 DIAGNOSIS — M4646 Discitis, unspecified, lumbar region: Secondary | ICD-10-CM | POA: Diagnosis present

## 2022-03-30 DIAGNOSIS — E861 Hypovolemia: Secondary | ICD-10-CM | POA: Diagnosis present

## 2022-03-30 DIAGNOSIS — I5032 Chronic diastolic (congestive) heart failure: Secondary | ICD-10-CM | POA: Diagnosis present

## 2022-03-30 DIAGNOSIS — E8809 Other disorders of plasma-protein metabolism, not elsewhere classified: Secondary | ICD-10-CM | POA: Diagnosis present

## 2022-03-30 DIAGNOSIS — I952 Hypotension due to drugs: Secondary | ICD-10-CM | POA: Diagnosis not present

## 2022-03-30 DIAGNOSIS — J8281 Chronic eosinophilic pneumonia: Secondary | ICD-10-CM | POA: Diagnosis present

## 2022-03-30 DIAGNOSIS — Z8661 Personal history of infections of the central nervous system: Secondary | ICD-10-CM

## 2022-03-30 DIAGNOSIS — R400 Somnolence: Secondary | ICD-10-CM | POA: Diagnosis not present

## 2022-03-30 DIAGNOSIS — D75839 Thrombocytosis, unspecified: Secondary | ICD-10-CM | POA: Diagnosis not present

## 2022-03-30 DIAGNOSIS — M4626 Osteomyelitis of vertebra, lumbar region: Secondary | ICD-10-CM | POA: Diagnosis present

## 2022-03-30 DIAGNOSIS — M1990 Unspecified osteoarthritis, unspecified site: Secondary | ICD-10-CM | POA: Diagnosis present

## 2022-03-30 DIAGNOSIS — B9562 Methicillin resistant Staphylococcus aureus infection as the cause of diseases classified elsewhere: Secondary | ICD-10-CM | POA: Diagnosis present

## 2022-03-30 DIAGNOSIS — R680 Hypothermia, not associated with low environmental temperature: Secondary | ICD-10-CM | POA: Diagnosis present

## 2022-03-30 DIAGNOSIS — D649 Anemia, unspecified: Secondary | ICD-10-CM | POA: Diagnosis present

## 2022-03-30 DIAGNOSIS — J189 Pneumonia, unspecified organism: Principal | ICD-10-CM

## 2022-03-30 DIAGNOSIS — Z79899 Other long term (current) drug therapy: Secondary | ICD-10-CM

## 2022-03-30 DIAGNOSIS — Z9049 Acquired absence of other specified parts of digestive tract: Secondary | ICD-10-CM

## 2022-03-30 DIAGNOSIS — E119 Type 2 diabetes mellitus without complications: Secondary | ICD-10-CM | POA: Diagnosis not present

## 2022-03-30 DIAGNOSIS — R7989 Other specified abnormal findings of blood chemistry: Secondary | ICD-10-CM | POA: Diagnosis not present

## 2022-03-30 DIAGNOSIS — E1169 Type 2 diabetes mellitus with other specified complication: Secondary | ICD-10-CM | POA: Diagnosis present

## 2022-03-30 DIAGNOSIS — E11649 Type 2 diabetes mellitus with hypoglycemia without coma: Secondary | ICD-10-CM | POA: Diagnosis present

## 2022-03-30 DIAGNOSIS — Z6826 Body mass index (BMI) 26.0-26.9, adult: Secondary | ICD-10-CM

## 2022-03-30 DIAGNOSIS — T380X5A Adverse effect of glucocorticoids and synthetic analogues, initial encounter: Secondary | ICD-10-CM | POA: Diagnosis not present

## 2022-03-30 DIAGNOSIS — M48061 Spinal stenosis, lumbar region without neurogenic claudication: Secondary | ICD-10-CM | POA: Diagnosis present

## 2022-03-30 DIAGNOSIS — Z880 Allergy status to penicillin: Secondary | ICD-10-CM

## 2022-03-30 DIAGNOSIS — E875 Hyperkalemia: Secondary | ICD-10-CM | POA: Diagnosis present

## 2022-03-30 DIAGNOSIS — Z66 Do not resuscitate: Secondary | ICD-10-CM | POA: Diagnosis not present

## 2022-03-30 DIAGNOSIS — Z792 Long term (current) use of antibiotics: Secondary | ICD-10-CM

## 2022-03-30 LAB — CBC WITH DIFFERENTIAL/PLATELET
Abs Immature Granulocytes: 0.32 10*3/uL — ABNORMAL HIGH (ref 0.00–0.07)
Basophils Absolute: 0 10*3/uL (ref 0.0–0.1)
Basophils Relative: 0 %
Eosinophils Absolute: 0.4 10*3/uL (ref 0.0–0.5)
Eosinophils Relative: 2 %
HCT: 32 % — ABNORMAL LOW (ref 36.0–46.0)
Hemoglobin: 10.4 g/dL — ABNORMAL LOW (ref 12.0–15.0)
Immature Granulocytes: 2 %
Lymphocytes Relative: 3 %
Lymphs Abs: 0.5 10*3/uL — ABNORMAL LOW (ref 0.7–4.0)
MCH: 28.7 pg (ref 26.0–34.0)
MCHC: 32.5 g/dL (ref 30.0–36.0)
MCV: 88.4 fL (ref 80.0–100.0)
Monocytes Absolute: 0.2 10*3/uL (ref 0.1–1.0)
Monocytes Relative: 1 %
Neutro Abs: 15.5 10*3/uL — ABNORMAL HIGH (ref 1.7–7.7)
Neutrophils Relative %: 92 %
Platelets: 498 10*3/uL — ABNORMAL HIGH (ref 150–400)
RBC: 3.62 MIL/uL — ABNORMAL LOW (ref 3.87–5.11)
RDW: 15.2 % (ref 11.5–15.5)
WBC: 16.9 10*3/uL — ABNORMAL HIGH (ref 4.0–10.5)
nRBC: 0 % (ref 0.0–0.2)

## 2022-03-30 LAB — GLUCOSE, CAPILLARY
Glucose-Capillary: 34 mg/dL — CL (ref 70–99)
Glucose-Capillary: 78 mg/dL (ref 70–99)

## 2022-03-30 LAB — COMPREHENSIVE METABOLIC PANEL
ALT: 55 U/L — ABNORMAL HIGH (ref 0–44)
AST: 53 U/L — ABNORMAL HIGH (ref 15–41)
Albumin: 2.4 g/dL — ABNORMAL LOW (ref 3.5–5.0)
Alkaline Phosphatase: 117 U/L (ref 38–126)
Anion gap: 16 — ABNORMAL HIGH (ref 5–15)
BUN: 25 mg/dL — ABNORMAL HIGH (ref 8–23)
CO2: 19 mmol/L — ABNORMAL LOW (ref 22–32)
Calcium: 9.5 mg/dL (ref 8.9–10.3)
Chloride: 96 mmol/L — ABNORMAL LOW (ref 98–111)
Creatinine, Ser: 0.89 mg/dL (ref 0.44–1.00)
GFR, Estimated: >60 mL/min (ref >60)
Glucose, Bld: 96 mg/dL (ref 70–99)
Potassium: 3.7 mmol/L (ref 3.5–5.1)
Sodium: 131 mmol/L — ABNORMAL LOW (ref 135–145)
Total Bilirubin: 1 mg/dL (ref 0.3–1.2)
Total Protein: 7.1 g/dL (ref 6.5–8.1)

## 2022-03-30 LAB — LACTIC ACID, PLASMA
Lactic Acid, Venous: 3.3 mmol/L (ref 0.5–1.9)
Lactic Acid, Venous: 1.1 mmol/L (ref 0.5–1.9)

## 2022-03-30 MED ORDER — VANCOMYCIN HCL 1250 MG/250ML IV SOLN
1250.0000 mg | INTRAVENOUS | Status: DC
  Administered 2022-03-31 – 2022-04-01 (×2): 1250 mg via INTRAVENOUS
  Filled 2022-03-30 (×4): qty 250

## 2022-03-30 MED ORDER — SODIUM CHLORIDE 0.9 % IV SOLN: INTRAVENOUS | Status: DC

## 2022-03-30 MED ORDER — ONDANSETRON HCL 4 MG/2ML IJ SOLN: 4.0000 mg | Freq: Four times a day (QID) | INTRAMUSCULAR | Status: DC | PRN

## 2022-03-30 MED ORDER — HYDRALAZINE HCL 20 MG/ML IJ SOLN: 5.0000 mg | Freq: Four times a day (QID) | INTRAMUSCULAR | Status: DC | PRN

## 2022-03-30 MED ORDER — ONDANSETRON HCL 4 MG PO TABS: 4.0000 mg | ORAL_TABLET | Freq: Four times a day (QID) | ORAL | Status: DC | PRN

## 2022-03-30 MED ORDER — IPRATROPIUM-ALBUTEROL 0.5-2.5 (3) MG/3ML IN SOLN: 3.0000 mL | Freq: Four times a day (QID) | RESPIRATORY_TRACT | Status: DC

## 2022-03-30 MED ORDER — BISACODYL 5 MG PO TBEC: 5.0000 mg | DELAYED_RELEASE_TABLET | Freq: Every day | ORAL | Status: DC | PRN

## 2022-03-30 MED ORDER — IPRATROPIUM-ALBUTEROL 0.5-2.5 (3) MG/3ML IN SOLN: 3.0000 mL | Freq: Four times a day (QID) | RESPIRATORY_TRACT | Status: DC | PRN

## 2022-03-30 MED ORDER — ACETAMINOPHEN ER 650 MG PO TBCR: 1300.0000 mg | EXTENDED_RELEASE_TABLET | Freq: Every day | ORAL | Status: DC | PRN

## 2022-03-30 MED ORDER — ASPIRIN 81 MG PO CHEW
81.0000 mg | CHEWABLE_TABLET | Freq: Every day | ORAL | Status: DC
  Administered 2022-03-30 – 2022-04-01 (×3): 81 mg via ORAL
  Filled 2022-03-30 (×3): qty 1

## 2022-03-30 MED ORDER — BACID PO TABS
2.0000 | ORAL_TABLET | Freq: Three times a day (TID) | ORAL | Status: DC
  Filled 2022-03-30: qty 2

## 2022-03-30 MED ORDER — FENTANYL CITRATE PF 50 MCG/ML IJ SOSY
50.0000 ug | PREFILLED_SYRINGE | Freq: Once | INTRAMUSCULAR | Status: AC
  Administered 2022-03-30: 50 ug via INTRAVENOUS
  Filled 2022-03-30: qty 1

## 2022-03-30 MED ORDER — INSULIN ASPART 100 UNIT/ML IJ SOLN: 0.0000 [IU] | Freq: Three times a day (TID) | INTRAMUSCULAR | Status: DC

## 2022-03-30 MED ORDER — SODIUM CHLORIDE 0.9 % IV SOLN
2.0000 g | Freq: Once | INTRAVENOUS | Status: AC
  Administered 2022-03-30: 2 g via INTRAVENOUS
  Filled 2022-03-30: qty 20

## 2022-03-30 MED ORDER — ENOXAPARIN SODIUM 40 MG/0.4ML IJ SOSY
40.0000 mg | PREFILLED_SYRINGE | INTRAMUSCULAR | Status: DC
  Administered 2022-03-31 – 2022-04-03 (×4): 40 mg via SUBCUTANEOUS
  Filled 2022-03-30 (×4): qty 0.4

## 2022-03-30 MED ORDER — SODIUM CHLORIDE 0.9 % IV SOLN
2.0000 g | INTRAVENOUS | Status: DC
  Administered 2022-03-31 – 2022-04-02 (×3): 2 g via INTRAVENOUS
  Filled 2022-03-30 (×3): qty 20

## 2022-03-30 MED ORDER — IOHEXOL 350 MG/ML SOLN
75.0000 mL | Freq: Once | INTRAVENOUS | Status: AC | PRN
  Administered 2022-03-30: 75 mL via INTRAVENOUS

## 2022-03-30 MED ORDER — SODIUM CHLORIDE 0.9 % IV BOLUS
1000.0000 mL | Freq: Once | INTRAVENOUS | Status: AC
  Administered 2022-03-30: 1000 mL via INTRAVENOUS

## 2022-03-30 MED ORDER — RISAQUAD PO CAPS: 2.0000 | ORAL_CAPSULE | Freq: Every day | ORAL | Status: DC

## 2022-03-30 MED ORDER — VANCOMYCIN HCL 1500 MG/300ML IV SOLN
1500.0000 mg | Freq: Once | INTRAVENOUS | Status: AC
  Administered 2022-03-30: 1500 mg via INTRAVENOUS
  Filled 2022-03-30: qty 300

## 2022-03-30 MED ORDER — DEXTROSE 50 % IV SOLN
INTRAVENOUS | Status: AC
  Administered 2022-03-30: 25 mL
  Filled 2022-03-30: qty 50

## 2022-03-30 MED ORDER — GUAIFENESIN ER 600 MG PO TB12
1200.0000 mg | ORAL_TABLET | Freq: Two times a day (BID) | ORAL | Status: DC
  Administered 2022-03-30: 1200 mg via ORAL
  Filled 2022-03-30 (×2): qty 2

## 2022-03-30 MED ORDER — SENNOSIDES-DOCUSATE SODIUM 8.6-50 MG PO TABS: 1.0000 | ORAL_TABLET | Freq: Every evening | ORAL | Status: DC | PRN

## 2022-03-30 NOTE — Progress Notes (Addendum)
Pharmacy Antibiotic Note  Melissa Leon is a 77 y.o. female admitted on 03/30/2022 with sepsis.  Pharmacy has been consulted for vacomycin dosing. Noteworthy to mention hx of MRSA epidural abscess, on daptomycin outpatient with EOT of 04/12/2022.   Plan: Give IV Vancomycin '1500mg'$  x 1 for loading dose, followed by IV Vancomycin 1250 every 24 hours for eAUC509. Scr: 0.89, Vd: 0.72. Follow culture data for de-escalation.  Monitor renal function for dose adjustments as indicated.   CFTX per MD.    Temp (24hrs), Avg:99.6 F (37.6 C), Min:97.3 F (36.3 C), Max:101.8 F (38.8 C)  Recent Labs  Lab 03/25/22 0340 03/30/22 1218  WBC 14.4* 16.9*  CREATININE 1.01* 0.89  LATICACIDVEN  --  3.3*    Estimated Creatinine Clearance: 52.3 mL/min (by C-G formula based on SCr of 0.89 mg/dL).    Allergies  Allergen Reactions   Choline Fenofibrate Other (See Comments)    ELEVATED LFT.    Penicillins Hives   Ozempic (0.25 Or 0.5 Mg-Dose) [Semaglutide(0.25 Or 0.'5mg'$ -Dos)] Nausea Only   Statins Other (See Comments)    Aches, pain    Thank you for allowing pharmacy to be a part of this patient's care.  Esmeralda Arthur, PharmD, BCCCP  03/30/2022 1:45 PM

## 2022-03-30 NOTE — ED Notes (Signed)
ED TO INPATIENT HANDOFF REPORT  ED Nurse Name and Phone #: Carlis Abbott [phone]  S Name/Age/Gender Melissa Leon 76 y.o. female Room/Bed: RESUSC/RESUSC  Code Status   Code Status: Full Code  Home/SNF/Other Home Patient oriented to: self, place, situation, time Is this baseline? Yes   Triage Complete: Triage complete  Chief Complaint Sepsis [facility]) [A41.9]  Triage Note Patient BIB [facility] from home for evaluation of fatigue after multiple recent hospitalizations and surgeries. Patient recently had a nephrostomy tube and surgical procedure for spinal abscess. Patient alert and oriented at this time.   Allergies Allergies  Allergen Reactions   Choline Fenofibrate Other (See Comments)    ELEVATED LFT.    Penicillins Hives   Ozempic (0.25 Or 0.5 Mg-Dose) [Semaglutide(0.25 Or 0.'5mg'$ -Dos)] Nausea Only   Statins Other (See Comments)    Aches, pain    Level of Care/Admitting Diagnosis ED Disposition     ED Disposition  Admit   Condition  --   Comment  Hospital Area: [place] [[id number]]  Level of Care: Telemetry Medical [104]  May admit patient to [facility] or [facility] if equivalent level of care is available:: No  Covid Evaluation: Asymptomatic - no recent exposure (last 10 days) testing not required  [facility]: [id number]  Admitting Physician: Lequita Halt [id number]  Attending Physician: Lequita Halt [id number]  Certification:: I certify this patient will need inpatient services for at least 2 midnights  Estimated Length of Stay: 2          B Medical/Surgery History Past Medical History:  Diagnosis Date   Anemia    Arthritis    Depression    Diabetes mellitus ([place])    Hypertension    Pneumonia    Spinal stenosis    Past Surgical History:  Procedure Laterality Date   APPENDECTOMY     CHOLECYSTECTOMY     CYSTOSCOPY WITH RETROGRADE PYELOGRAM, URETEROSCOPY AND STENT PLACEMENT Right 02/27/2022   Procedure: TURBT, CYSTOSCOPY WITH  RIGHT RETROGRADE PYELOGRAM, RIGHT URETEROSCOPY, BIOPSY AND STENT PLACEMENT;  Surgeon: Janith Lima, MD;  Location: [facility];  Service: Urology;  Laterality: Right;  30 MINUTES NEEDED FOR CASE   IR NEPHROSTOMY PLACEMENT RIGHT  01/05/2022   LUMBAR LAMINECTOMY FOR EPIDURAL ABSCESS N/A 03/01/2022   Procedure: LUMBAR LAMINECTOMY LUMBAR THREE-FOUR, LUMBAR FOUR- FIVE FOR EPIDURAL ABSCESS;  Surgeon: Eustace Moore, MD;  Location: [place];  Service: Neurosurgery;  Laterality: N/A;   UTERINE FIBROID SURGERY     WRIST GANGLION EXCISION Right      A IV Location/Drains/Wounds Patient Lines/Drains/Airways Status     Active Line/Drains/Airways     Name Placement date Placement time Site Days   PICC Single Lumen 03/04/22 Right Brachial 39 cm 0 cm 03/04/22  0921  Brachial  26   Wound / Incision (Open or Dehisced) 02/28/22 Non-pressure wound;Other (Comment) Hip Right;Lateral closed 02/28/22  0510  Hip  30            Intake/Output Last 24 hours No intake or output data in the 24 hours ending 03/30/22 1802  Labs/Imaging Results for orders placed or performed during the hospital encounter of 03/30/22 (from the past 48 hour(s))  Lactic acid, plasma     Status: Abnormal   Collection Time: 03/30/22 12:18 PM  Result Value Ref Range   Lactic Acid, Venous 3.3 (HH) 0.5 - 1.9 mmol/L    Comment: CRITICAL RESULT CALLED TO, READ BACK BY AND VERIFIED WITH Marlise Eves, RN @ [phone]  03/30/22 BY SEKDAHL Performed at [facility], [place] [address] [place], [place] [address]   Culture, blood (routine x 2)     Status: None (Preliminary result)   Collection Time: 03/30/22 12:18 PM   Specimen: BLOOD  Result Value Ref Range   Specimen Description BLOOD LEFT ANTECUBITAL    Special Requests      BOTTLES DRAWN AEROBIC AND ANAEROBIC Blood Culture adequate volume   Culture      NO GROWTH < 12 HOURS Performed at [facility], [place] [address], [place], [place] [address]    Report Status PENDING   CBC with  Differential     Status: Abnormal   Collection Time: 03/30/22 12:18 PM  Result Value Ref Range   WBC 16.9 (H) 4.0 - 10.5 K/uL   RBC 3.62 (L) 3.87 - 5.11 MIL/uL   Hemoglobin 10.4 (L) 12.0 - 15.0 g/dL   HCT 32.0 (L) 36.0 - 46.0 %   MCV 88.4 80.0 - 100.0 fL   MCH 28.7 26.0 - 34.0 pg   MCHC 32.5 30.0 - 36.0 g/dL   RDW 15.2 11.5 - 15.5 %   Platelets 498 (H) 150 - 400 K/uL   nRBC 0.0 0.0 - 0.2 %   Neutrophils Relative % 92 %   Neutro Abs 15.5 (H) 1.7 - 7.7 K/uL   Lymphocytes Relative 3 %   Lymphs Abs 0.5 (L) 0.7 - 4.0 K/uL   Monocytes Relative 1 %   Monocytes Absolute 0.2 0.1 - 1.0 K/uL   Eosinophils Relative 2 %   Eosinophils Absolute 0.4 0.0 - 0.5 K/uL   Basophils Relative 0 %   Basophils Absolute 0.0 0.0 - 0.1 K/uL   Immature Granulocytes 2 %   Abs Immature Granulocytes 0.32 (H) 0.00 - 0.07 K/uL    Comment: Performed at [facility], [address] [place], [place] [address]  Comprehensive metabolic panel     Status: Abnormal   Collection Time: 03/30/22 12:18 PM  Result Value Ref Range   Sodium 131 (L) 135 - 145 mmol/L   Potassium 3.7 3.5 - 5.1 mmol/L   Chloride 96 (L) 98 - 111 mmol/L   CO2 19 (L) 22 - 32 mmol/L   Glucose, Bld 96 70 - 99 mg/dL    Comment: Glucose reference range applies only to samples taken after fasting for at least 8 hours.   BUN 25 (H) 8 - 23 mg/dL   Creatinine, Ser 0.89 0.44 - 1.00 mg/dL   Calcium 9.5 8.9 - 10.3 mg/dL   Total Protein 7.1 6.5 - 8.1 g/dL   Albumin 2.4 (L) 3.5 - 5.0 g/dL   AST 53 (H) 15 - 41 U/L   ALT 55 (H) 0 - 44 U/L   Alkaline Phosphatase 117 38 - 126 U/L   Total Bilirubin 1.0 0.3 - 1.2 mg/dL   GFR, Estimated >60 >60 mL/min    Comment: (NOTE) Calculated using the CKD-EPI Creatinine Equation (2021)    Anion gap 16 (H) 5 - 15    Comment: Performed at [facility], [place] [address], [place], [place] [address]   CT CHEST ABDOMEN PELVIS W CONTRAST  Result Date: 03/30/2022 CLINICAL DATA:  Pneumonia, fatigue, recent  hospitalization and surgeries including nephrostomy and surgical procedure for spinal abscess EXAM: CT CHEST, ABDOMEN, AND PELVIS WITH CONTRAST TECHNIQUE: Multidetector CT imaging of the chest, abdomen and pelvis was performed following the standard protocol during bolus administration of intravenous contrast. RADIATION DOSE REDUCTION:  This exam was performed according to the departmental dose-optimization program which includes automated exposure control, adjustment of the mA and/or kV according to patient size and/or use of iterative reconstruction technique. CONTRAST:  59m OMNIPAQUE IOHEXOL 350 MG/ML SOLN COMPARISON:  CT abdomen pelvis, 03/19/2022, CT chest, 02/24/2022 FINDINGS: CT CHEST FINDINGS Cardiovascular: Aortic atherosclerosis. Normal heart size. Three-vessel coronary artery calcifications. No pericardial effusion. Mediastinum/Nodes: Prominent mediastinal lymph nodes, likely reactive. Thyroid gland, trachea, and esophagus demonstrate no significant findings. Lungs/Pleura: Very extensive heterogeneous and consolidative airspace opacity throughout the lungs with small bilateral pleural effusions. Musculoskeletal: No chest wall abnormality. No acute osseous findings. CT ABDOMEN PELVIS FINDINGS Hepatobiliary: No focal liver abnormality is seen. Status post cholecystectomy. Unchanged postoperative biliary dilatation. Pancreas: Unremarkable. No pancreatic ductal dilatation or surrounding inflammatory changes. Spleen: Normal in size without significant abnormality. Adrenals/Urinary Tract: Adrenal glands are unremarkable. Similar right hydronephrosis with a right-sided double-J ureteral stent catheter, formed pigtails in the right renal pelvis and urinary bladder. Left kidney is normal, without renal calculi, solid lesion, or hydronephrosis. Bladder is unremarkable. Stomach/Bowel: Stomach is within normal limits. Diverticulum of the descending portion of the duodenum. Appendix appears normal. No evidence of  bowel wall thickening, distention, or inflammatory changes. Sigmoid diverticula. Large burden of stool throughout the colon. Vascular/Lymphatic: Aortic atherosclerosis. No enlarged abdominal or pelvic lymph nodes. Reproductive: No mass or other abnormality. Other: No abdominal wall hernia or abnormality. No ascites. Musculoskeletal: No acute osseous findings. 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 IMPRESSION: 1. Very extensive heterogeneous and consolidative airspace opacity throughout the lungs with small bilateral pleural effusions. Findings are consistent with multifocal infection. 2. Similar right hydronephrosis with a right-sided double-J ureteral stent catheter, formed pigtails in the right renal pelvis and urinary bladder. 3. Coronary artery disease. Aortic Atherosclerosis (ICD10-I70.0). Electronically Signed   By: ADelanna AhmadiM.D.   On: 03/30/2022 16:14   DG Chest Portable 1 View  Result Date: 03/30/2022 CLINICAL DATA:  Weakness EXAM: PORTABLE CHEST 1 VIEW COMPARISON:  CXR 03/22/22 FINDINGS: Right arm PICC with the tip in the lower SVC. There evolving bilateral airspace opacities with interval decrease in previously seen airspace opacities in the left upper lung and marked interval progression in the peripheral aspect of the right upper and mid lung fields. No large pleural effusion. No pneumothorax. Unchanged cardiac and mediastinal contours. No radiographically apparent displaced rib fractures. Visualized upper abdomen is unremarkable. IMPRESSION: Evolving bilateral airspace opacities with interval decrease in previously seen airspace opacities in the left upper lung and marked interval progression in the peripheral aspect of the right upper and mid lung fields. Findings are concerning for multifocal pneumonia. Recommend repeat chest CT for further evaluation. Electronically Signed   By: HMarin RobertsM.D.   On:  03/30/2022 12:41    Pending Labs Unresulted Labs (From admission, onward)     Start     Ordered   03/31/22 0500  CBC  Tomorrow morning,   R        03/30/22 1716   03/31/22 [id number] Basic metabolic panel  Tomorrow morning,   R        03/30/22 1716   03/30/22 1741  Expectorated Sputum Assessment w Gram Stain, Rflx to Resp Cult  Once,   R        03/30/22 1740   03/30/22 1150  Lactic acid, plasma  Now then every 2 hours,   R (with STAT occurrences)  03/30/22 1149   03/30/22 1150  Urinalysis, w/ Reflex to Culture (Infection Suspected) -Urine, Unspecified Source  Once,   URGENT       Question:  Specimen Source  Answer:  Urine, Unspecified Source   03/30/22 1149   03/30/22 1150  Culture, blood (routine x 2)  BLOOD CULTURE X 2,   R (with STAT occurrences)      03/30/22 1149            Vitals/Pain Today's Vitals   03/30/22 1315 03/30/22 1330 03/30/22 1415 03/30/22 1500  BP: 133/65   110/66  Pulse: (!) 104   (!) 105  Resp: 20   (!) 24  Temp:      TempSrc:      SpO2: 91% 92% 96% 93%  PainSc:        Isolation Precautions No active isolations  Medications Medications  vancomycin (VANCOREADY) IVPB 1500 mg/300 mL (1,500 mg Intravenous New Bag/Given 03/30/22 1719)  vancomycin (VANCOREADY) IVPB 1250 mg/250 mL (has no administration in time range)  aspirin chewable tablet 81 mg (has no administration in time range)  hydrALAZINE (APRESOLINE) injection 5 mg (has no administration in time range)  ipratropium-albuterol (DUONEB) 0.5-2.5 (3) MG/3ML nebulizer solution 3 mL (has no administration in time range)  guaiFENesin (MUCINEX) 12 hr tablet 1,200 mg (has no administration in time range)  enoxaparin (LOVENOX) injection 40 mg (has no administration in time range)  0.9 %  sodium chloride infusion (has no administration in time range)  bisacodyl (DULCOLAX) EC tablet 5 mg (has no administration in time range)  senna-docusate (Senokot-S) tablet 1 tablet (has no administration in time range)   lactobacillus acidophilus (BACID) tablet 2 tablet (has no administration in time range)  ondansetron (ZOFRAN) tablet 4 mg (has no administration in time range)    Or  ondansetron (ZOFRAN) injection 4 mg (has no administration in time range)  insulin aspart (novoLOG) injection 0-15 Units (has no administration in time range)  cefTRIAXone (ROCEPHIN) 2 g in sodium chloride 0.9 % 100 mL IVPB (has no administration in time range)  cefTRIAXone (ROCEPHIN) 2 g in sodium chloride 0.9 % 100 mL IVPB (0 g Intravenous Stopped 03/30/22 1456)  sodium chloride 0.9 % bolus 1,000 mL (0 mLs Intravenous Stopped 03/30/22 1456)  fentaNYL (SUBLIMAZE) injection 50 mcg (50 mcg Intravenous Given 03/30/22 1502)  iohexol (OMNIPAQUE) 350 MG/ML injection 75 mL (75 mLs Intravenous Contrast Given 03/30/22 1604)    Mobility non-ambulatory        R Recommendations: See Admitting Provider Note  Report given to:   Additional Note

## 2022-03-30 NOTE — H&P (Signed)
History and Physical    Melissa Leon [id number] DOB: Mar 10, 1945 DOA: 03/30/2022  PCP: Katherina Mires, MD (Confirm with patient/family/NH records and if not entered, this has to be entered at [facility] point of entry) Patient coming from: Home  I have personally briefly reviewed patient's old medical records in [place]  Chief Complaint: Cough, SOB, low temperature  HPI: Melissa Leon is a 77 y.o. 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  Patient was recently admitted to hospital to treat course of acute UTI (yeast in urine culture only), multifocal pneumonia treated with vancomycin, azithromycin and cefepime during hospitalization and patient has been on daptomycin chronically for epidural abscess with a scheduled end date of 3/24 to complete a total of 8 weeks treatment.  After discharge home, patient continued to experience generalized weakness, and daughter also reported the patient has been having cough especially after eating meals, and this morning family found the patient temperature 15F and decided to bring her to the hospital.  Patient complained about feeling shortness of breath and generalized weakness denies any chest pain no urinary symptoms.  ED Course: Patient was found feverish 101.8 tachycardia 104, blood pressure 120/60, O2 saturation 84% on room air.  CT abdomen pelvis chest showed multifocal pneumonia WBC 16.9, hemoglobin 10.4 sodium 131, bicarb 19 BUN 25 lactic acid 3.3.  Patient disease consulted recommend continue daptomycin, start ceftriaxone and vancomycin  Review of Systems: As per HPI otherwise 14 point review of systems negative.    Past Medical History:  Diagnosis Date   Anemia    Arthritis     Depression    Diabetes mellitus ([place])    Hypertension    Pneumonia    Spinal stenosis     Past Surgical History:  Procedure Laterality Date   APPENDECTOMY     CHOLECYSTECTOMY     CYSTOSCOPY WITH RETROGRADE PYELOGRAM, URETEROSCOPY AND STENT PLACEMENT Right 02/27/2022   Procedure: TURBT, CYSTOSCOPY WITH RIGHT RETROGRADE PYELOGRAM, RIGHT URETEROSCOPY, BIOPSY AND STENT PLACEMENT;  Surgeon: Janith Lima, MD;  Location: [facility];  Service: Urology;  Laterality: Right;  30 MINUTES NEEDED FOR CASE   IR NEPHROSTOMY PLACEMENT RIGHT  01/05/2022   LUMBAR LAMINECTOMY FOR EPIDURAL ABSCESS N/A 03/01/2022   Procedure: LUMBAR LAMINECTOMY LUMBAR THREE-FOUR, LUMBAR FOUR- FIVE FOR EPIDURAL ABSCESS;  Surgeon: Eustace Moore, MD;  Location: [place];  Service: Neurosurgery;  Laterality: N/A;   UTERINE FIBROID SURGERY     WRIST GANGLION EXCISION Right      reports that she has quit smoking. Her smoking use included cigarettes. She has a 0.50 pack-year smoking history. She has never used smokeless tobacco. She reports that she does not currently use alcohol. She reports that she does not use drugs.  Allergies  Allergen Reactions   Choline Fenofibrate Other (See Comments)    ELEVATED LFT.    Penicillins Hives   Ozempic (0.25 Or 0.5 Mg-Dose) [Semaglutide(0.25 Or 0.'5mg'$ -Dos)] Nausea Only   Statins Other (See Comments)    Aches, pain    Family History  Problem Relation Age of Onset   Breast cancer Neg Hx      Prior to Admission medications   Medication Sig Start Date End Date Taking? Authorizing Provider  acetaminophen (TYLENOL) 650 MG CR tablet Take 1,300 mg by mouth daily as needed  for pain.    [provider]  amLODipine (NORVASC) 5 MG tablet Take 5 mg by mouth at bedtime. 03/27/21   [provider]  aspirin 81 MG chewable tablet Chew 81 mg by mouth at bedtime.    [provider]  daptomycin (CUBICIN) IVPB Inject 650 mg into the vein daily for 20 days. Indication:  MRSA epidural  abscess First Dose: Yes Last Day of Therapy:  04/12/22 Labs - Once weekly:  CBC/D, BMP, and CPK Labs - Every other week:  ESR and CRP Method of administration: IV Push Pull PICC line at the completion of IV antibiotics Method of administration may be changed at the discretion of home infusion pharmacist based upon assessment of the patient and/or caregiver's ability to self-administer the medication ordered. 03/25/22 04/14/22  Pokhrel, Corrie Mckusick, MD  flavoxATE (URISPAS) 100 MG tablet Take 1 tablet (100 mg total) by mouth 3 (three) times daily as needed for bladder spasms. 03/25/22   Pokhrel, Corrie Mckusick, MD  glimepiride (AMARYL) 4 MG tablet Take 4 mg by mouth daily. 01/28/22   [provider]  hydrochlorothiazide (HYDRODIURIL) 25 MG tablet Take 25 mg by mouth every morning. 01/15/22   [provider]  losartan (COZAAR) 100 MG tablet Take 100 mg by mouth in the morning. 04/21/21   [provider]  metFORMIN (GLUCOPHAGE-XR) 500 MG 24 hr tablet Take 1,000 mg by mouth 2 (two) times daily with a meal. 03/27/21   [provider]    Physical Exam: Vitals:   03/30/22 1315 03/30/22 1330 03/30/22 1415 03/30/22 1500  BP: 133/65   110/66  Pulse: (!) 104   (!) 105  Resp: 20   (!) 24  Temp:      TempSrc:      SpO2: 91% 92% 96% 93%    Constitutional: NAD, calm, comfortable Vitals:   03/30/22 1315 03/30/22 1330 03/30/22 1415 03/30/22 1500  BP: 133/65   110/66  Pulse: (!) 104   (!) 105  Resp: 20   (!) 24  Temp:      TempSrc:      SpO2: 91% 92% 96% 93%   Eyes: PERRL, lids and conjunctivae normal ENMT: Mucous membranes are moist. Posterior pharynx clear of any exudate or lesions.Normal dentition.  Neck: normal, supple, no masses, no thyromegaly Respiratory: clear to auscultation bilaterally, no wheezing, bilateral crackles with increasing breathing effort no accessory muscle use.  Cardiovascular: Regular rate and rhythm, no murmurs / rubs / gallops. No extremity edema. 2+ pedal  pulses. No carotid bruits.  Abdomen: no tenderness, no masses palpated. No hepatosplenomegaly. Bowel sounds positive.  Musculoskeletal: no clubbing / cyanosis. No joint deformity upper and lower extremities. Good ROM, no contractures. Normal muscle tone.  Skin: Rash of the sacral area of skin, no significant ulcer Neurologic: CN 2-12 grossly intact. Sensation intact, DTR normal. Strength 5/5 in all 4.  Psychiatric: Normal judgment and insight. Alert and oriented x 3. Normal mood.     Labs on Admission: I have personally reviewed following labs and imaging studies  CBC: Recent Labs  Lab 03/25/22 0340 03/30/22 1218  WBC 14.4* 16.9*  NEUTROABS  --  15.5*  HGB 8.5* 10.4*  HCT 26.5* 32.0*  MCV 89.5 88.4  PLT 428* 123XX123*   Basic Metabolic Panel: Recent Labs  Lab 03/25/22 0340 03/30/22 1218  NA 135 131*  K 3.6 3.7  CL 102 96*  CO2 25 19*  GLUCOSE 180* 96  BUN 37* 25*  CREATININE 1.01* 0.89  CALCIUM 8.9  9.5   GFR: Estimated Creatinine Clearance: 52.3 mL/min (by C-G formula based on SCr of 0.89 mg/dL). Liver Function Tests: Recent Labs  Lab 03/30/22 1218  AST 53*  ALT 55*  ALKPHOS 117  BILITOT 1.0  PROT 7.1  ALBUMIN 2.4*   No results for input(s): "LIPASE", "AMYLASE" in the last 168 hours. No results for input(s): "AMMONIA" in the last 168 hours. Coagulation Profile: No results for input(s): "INR", "PROTIME" in the last 168 hours. Cardiac Enzymes: No results for input(s): "CKTOTAL", "CKMB", "CKMBINDEX", "TROPONINI" in the last 168 hours. BNP (last 3 results) No results for input(s): "PROBNP" in the last 8760 hours. HbA1C: No results for input(s): "HGBA1C" in the last 72 hours. CBG: Recent Labs  Lab 03/24/22 0634 03/24/22 1138 03/24/22 1649 03/24/22 2116 03/25/22 0630  GLUCAP 216* 141* 97 185* 172*   Lipid Profile: No results for input(s): "CHOL", "HDL", "LDLCALC", "TRIG", "CHOLHDL", "LDLDIRECT" in the last 72 hours. Thyroid Function Tests: No results for  input(s): "TSH", "T4TOTAL", "FREET4", "T3FREE", "THYROIDAB" in the last 72 hours. Anemia Panel: No results for input(s): "VITAMINB12", "FOLATE", "FERRITIN", "TIBC", "IRON", "RETICCTPCT" in the last 72 hours. Urine analysis:    Component Value Date/Time   COLORURINE YELLOW 03/20/2022 0045   APPEARANCEUR CLEAR 03/20/2022 0045   LABSPEC 1.020 03/20/2022 0045   PHURINE 6.0 03/20/2022 0045   GLUCOSEU NEGATIVE 03/20/2022 0045   HGBUR SMALL (A) 03/20/2022 0045   BILIRUBINUR NEGATIVE 03/20/2022 0045   KETONESUR NEGATIVE 03/20/2022 0045   PROTEINUR 30 (A) 03/20/2022 0045   NITRITE POSITIVE (A) 03/20/2022 0045   LEUKOCYTESUR MODERATE (A) 03/20/2022 0045    Radiological Exams on Admission: CT CHEST ABDOMEN PELVIS W CONTRAST  Result Date: 03/30/2022 CLINICAL DATA:  Pneumonia, fatigue, recent hospitalization and surgeries including nephrostomy and surgical procedure for spinal abscess EXAM: CT CHEST, ABDOMEN, AND PELVIS WITH CONTRAST TECHNIQUE: Multidetector CT imaging of the chest, abdomen and pelvis was performed following the standard protocol during bolus administration of intravenous contrast. RADIATION DOSE REDUCTION: This exam was performed according to the departmental dose-optimization program which includes automated exposure control, adjustment of the mA and/or kV according to patient size and/or use of iterative reconstruction technique. CONTRAST:  39m OMNIPAQUE IOHEXOL 350 MG/ML SOLN COMPARISON:  CT abdomen pelvis, 03/19/2022, CT chest, 02/24/2022 FINDINGS: CT CHEST FINDINGS Cardiovascular: Aortic atherosclerosis. Normal heart size. Three-vessel coronary artery calcifications. No pericardial effusion. Mediastinum/Nodes: Prominent mediastinal lymph nodes, likely reactive. Thyroid gland, trachea, and esophagus demonstrate no significant findings. Lungs/Pleura: Very extensive heterogeneous and consolidative airspace opacity throughout the lungs with small bilateral pleural effusions.  Musculoskeletal: No chest wall abnormality. No acute osseous findings. CT ABDOMEN PELVIS FINDINGS Hepatobiliary: No focal liver abnormality is seen. Status post cholecystectomy. Unchanged postoperative biliary dilatation. Pancreas: Unremarkable. No pancreatic ductal dilatation or surrounding inflammatory changes. Spleen: Normal in size without significant abnormality. Adrenals/Urinary Tract: Adrenal glands are unremarkable. Similar right hydronephrosis with a right-sided double-J ureteral stent catheter, formed pigtails in the right renal pelvis and urinary bladder. Left kidney is normal, without renal calculi, solid lesion, or hydronephrosis. Bladder is unremarkable. Stomach/Bowel: Stomach is within normal limits. Diverticulum of the descending portion of the duodenum. Appendix appears normal. No evidence of bowel wall thickening, distention, or inflammatory changes. Sigmoid diverticula. Large burden of stool throughout the colon. Vascular/Lymphatic: Aortic atherosclerosis. No enlarged abdominal or pelvic lymph nodes. Reproductive: No mass or other abnormality. Other: No abdominal wall hernia or abnormality. No ascites. Musculoskeletal: No acute osseous findings. Disc degenerative disease and bridging osteophytosis throughout the thoracic and  upper lumbar spine, in keeping with DISH. IMPRESSION: 1. Very extensive heterogeneous and consolidative airspace opacity throughout the lungs with small bilateral pleural effusions. Findings are consistent with multifocal infection. 2. Similar right hydronephrosis with a right-sided double-J ureteral stent catheter, formed pigtails in the right renal pelvis and urinary bladder. 3. Coronary artery disease. Aortic Atherosclerosis (ICD10-I70.0). Electronically Signed   By: Delanna Ahmadi M.D.   On: 03/30/2022 16:14   DG Chest Portable 1 View  Result Date: 03/30/2022 CLINICAL DATA:  Weakness EXAM: PORTABLE CHEST 1 VIEW COMPARISON:  CXR 03/22/22 FINDINGS: Right arm PICC with the  tip in the lower SVC. There evolving bilateral airspace opacities with interval decrease in previously seen airspace opacities in the left upper lung and marked interval progression in the peripheral aspect of the right upper and mid lung fields. No large pleural effusion. No pneumothorax. Unchanged cardiac and mediastinal contours. No radiographically apparent displaced rib fractures. Visualized upper abdomen is unremarkable. IMPRESSION: Evolving bilateral airspace opacities with interval decrease in previously seen airspace opacities in the left upper lung and marked interval progression in the peripheral aspect of the right upper and mid lung fields. Findings are concerning for multifocal pneumonia. Recommend repeat chest CT for further evaluation. Electronically Signed   By: Marin Roberts M.D.   On: 03/30/2022 12:41    EKG: Independently reviewed.  Sinus rhythm, with occasional PVCs, no acute ST changes.  Assessment/Plan Principal Problem:   Sepsis ([place]) Active Problems:   Sepsis without acute organ dysfunction ([place])   Pneumonia due to infectious organism   Acute respiratory failure with hypoxia ([place])  (please populate well all problems here in Problem List. (For example, if patient is on BP meds at home and you resume or decide to hold them, it is a problem that needs to be her. Same for CAD, COPD, HLD and so on)  Sepsis -Evidenced by new onset of fever, tachycardia, worsening of leukocytosis and new lactate acid elevation, suspected source is recurrent multifocal pneumonia -Family reported occasional coughing and choking after eating meals, will keep patient n.p.o. 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

## 2022-03-30 NOTE — ED Provider Notes (Signed)
[place] Provider Note   CSN: [id number] Arrival date & time: 03/30/22  1134     History {Add pertinent medical, surgical, social history, OB history to HPI:1} Chief Complaint  Patient presents with   Fatigue    Melissa Leon is a 77 y.o. female.  HPI Patient presents with fatigue and sleeping more.  Recent discharge from the hospital and is currently on daptomycin for MRSA epidural abscess.  Also previously had had urinary retention and percutaneous nephrostomy on right with stenting.  Also multifocal pneumonia.  Had been on Levaquin and vancomycin while in the hospital.  Now feeling worse.  Continues to have back pain.  Some mild hypoxia upon arrival.  Harsh breath sounds.   Past Medical History:  Diagnosis Date   Anemia    Arthritis    Depression    Diabetes mellitus ([place])    Hypertension    Pneumonia    Spinal stenosis     Home Medications Prior to Admission medications   Medication Sig Start Date End Date Taking? Authorizing Provider  acetaminophen (TYLENOL) 650 MG CR tablet Take 1,300 mg by mouth daily as needed for pain.    [provider]  amLODipine (NORVASC) 5 MG tablet Take 5 mg by mouth at bedtime. 03/27/21   [provider]  aspirin 81 MG chewable tablet Chew 81 mg by mouth at bedtime.    [provider]  daptomycin (CUBICIN) IVPB Inject 650 mg into the vein daily for 20 days. Indication:  MRSA epidural abscess First Dose: Yes Last Day of Therapy:  04/12/22 Labs - Once weekly:  CBC/D, BMP, and CPK Labs - Every other week:  ESR and CRP Method of administration: IV Push Pull PICC line at the completion of IV antibiotics Method of administration may be changed at the discretion of home infusion pharmacist based upon assessment of the patient and/or caregiver's ability to self-administer the medication ordered. 03/25/22 04/14/22  Pokhrel, Corrie Mckusick, MD  flavoxATE (URISPAS) 100 MG tablet Take 1  tablet (100 mg total) by mouth 3 (three) times daily as needed for bladder spasms. 03/25/22   Pokhrel, Corrie Mckusick, MD  glimepiride (AMARYL) 4 MG tablet Take 4 mg by mouth daily. 01/28/22   [provider]  hydrochlorothiazide (HYDRODIURIL) 25 MG tablet Take 25 mg by mouth every morning. 01/15/22   [provider]  losartan (COZAAR) 100 MG tablet Take 100 mg by mouth in the morning. 04/21/21   [provider]  metFORMIN (GLUCOPHAGE-XR) 500 MG 24 hr tablet Take 1,000 mg by mouth 2 (two) times daily with a meal. 03/27/21   [provider]      Allergies    Choline fenofibrate, Penicillins, Ozempic (0.25 or 0.5 mg-dose) [semaglutide(0.25 or 0.'5mg'$ -dos)], and Statins    Review of Systems   Review of Systems  Physical Exam Updated Vital Signs BP 133/65   Pulse (!) 104   Temp (!) 101.8 F (38.8 C) (Rectal)   Resp 20   SpO2 91%  Physical Exam Vitals and nursing note reviewed.  Cardiovascular:     Rate and Rhythm: Tachycardia present.  Pulmonary:     Breath sounds: No wheezing.     Comments: Diffuse harsh breath sounds. Abdominal:     Tenderness: There is no abdominal tenderness.  Musculoskeletal:        General: Tenderness present.     Cervical back: Neck supple.     Comments: Mild lumbar tenderness.  Neurological:  Mental Status: She is alert and oriented to person, place, and time.     ED Results / Procedures / Treatments   Labs (all labs ordered are listed, but only abnormal results are displayed) Labs Reviewed  LACTIC ACID, PLASMA - Abnormal; Notable for the following components:      Result Value   Lactic Acid, Venous 3.3 (*)    All other components within normal limits  CBC WITH DIFFERENTIAL/PLATELET - Abnormal; Notable for the following components:   WBC 16.9 (*)    RBC 3.62 (*)    Hemoglobin 10.4 (*)    HCT 32.0 (*)    Platelets 498 (*)    Neutro Abs 15.5 (*)    Lymphs Abs 0.5 (*)    Abs Immature Granulocytes 0.32 (*)    All other  components within normal limits  COMPREHENSIVE METABOLIC PANEL - Abnormal; Notable for the following components:   Sodium 131 (*)    Chloride 96 (*)    CO2 19 (*)    BUN 25 (*)    Albumin 2.4 (*)    AST 53 (*)    ALT 55 (*)    Anion gap 16 (*)    All other components within normal limits  CULTURE, BLOOD (ROUTINE X 2)  CULTURE, BLOOD (ROUTINE X 2)  LACTIC ACID, PLASMA  URINALYSIS, W/ REFLEX TO CULTURE (INFECTION SUSPECTED)    EKG EKG Interpretation  Date/Time:  Monday March 30 2022 12:25:40 EDT Ventricular Rate:  96 PR Interval:  176 QRS Duration: 84 QT Interval:  333 QTC Calculation: 421 R Axis:   26 Text Interpretation: Sinus rhythm Ventricular premature complex Low voltage, precordial leads Confirmed by Davonna Belling [phone]) on 03/30/2022 12:31:18 PM  Radiology DG Chest Portable 1 View  Result Date: 03/30/2022 CLINICAL DATA:  Weakness EXAM: PORTABLE CHEST 1 VIEW COMPARISON:  CXR 03/22/22 FINDINGS: Right arm PICC with the tip in the lower SVC. There evolving bilateral airspace opacities with interval decrease in previously seen airspace opacities in the left upper lung and marked interval progression in the peripheral aspect of the right upper and mid lung fields. No large pleural effusion. No pneumothorax. Unchanged cardiac and mediastinal contours. No radiographically apparent displaced rib fractures. Visualized upper abdomen is unremarkable. IMPRESSION: Evolving bilateral airspace opacities with interval decrease in previously seen airspace opacities in the left upper lung and marked interval progression in the peripheral aspect of the right upper and mid lung fields. Findings are concerning for multifocal pneumonia. Recommend repeat chest CT for further evaluation. Electronically Signed   By: Marin Roberts M.D.   On: 03/30/2022 12:41    Procedures Procedures  {Document cardiac monitor, telemetry assessment procedure when appropriate:1}  Medications Ordered in  ED Medications  vancomycin (VANCOREADY) IVPB 1500 mg/300 mL (has no administration in time range)  vancomycin (VANCOREADY) IVPB 1250 mg/250 mL (has no administration in time range)  fentaNYL (SUBLIMAZE) injection 50 mcg (has no administration in time range)  cefTRIAXone (ROCEPHIN) 2 g in sodium chloride 0.9 % 100 mL IVPB (2 g Intravenous New Bag/Given 03/30/22 1348)  sodium chloride 0.9 % bolus 1,000 mL (1,000 mLs Intravenous New Bag/Given 03/30/22 1343)    ED Course/ Medical Decision Making/ A&P   {   Click here for ABCD2, HEART and other calculatorsREFRESH Note before signing :1}                          Medical Decision Making Amount and/or Complexity of Data Reviewed  Labs: ordered. Radiology: ordered.  Risk Prescription drug management.   Patient with fatigue.  Recent infection.  However found to have fever on rectal temperature.  With complicated course required consult to infectious ease.  Discussed with Dr. Drucilla Schmidt.  Will give Rocephin to cover worsening pneumonia.  X-ray independently interpreted and does show worse infiltrates on the right.  White count also elevated.  Lactic acid mildly elevated.  Will give fluid bolus.  Also will give some pain medicine to help with the back pain.  Will check urinalysis to evaluate for kidney infection.  Recent culture is reviewed and did show only yeast.  Will get chest of the chest abdomen pelvis to evaluate for the pneumonia and potentially upper abdominal infections with history.  Will require admission to hospital.   CRITICAL CARE Performed by: Davonna Belling Total critical care time: 30 minutes Critical care time was exclusive of separately billable procedures and treating other patients. Critical care was necessary to treat or prevent imminent or life-threatening deterioration. Critical care was time spent personally by me on the following activities: development of treatment plan with patient and/or surrogate as well as nursing,  discussions with consultants, evaluation of patient's response to treatment, examination of patient, obtaining history from patient or surrogate, ordering and performing treatments and interventions, ordering and review of laboratory studies, ordering and review of radiographic studies, pulse oximetry and re-evaluation of patient's condition.  Does not appear to have severe sepsis at this time.   {Document critical care time when appropriate:1} {Document review of labs and clinical decision tools ie heart score, Chads2Vasc2 etc:1}  {Document your independent review of radiology images, and any outside records:1} {Document your discussion with family members, caretakers, and with consultants:1} {Document social determinants of health affecting pt's care:1} {Document your decision making why or why not admission, treatments were needed:1} Final Clinical Impression(s) / ED Diagnoses Final diagnoses:  None    Rx / DC Orders ED Discharge Orders     None

## 2022-03-30 NOTE — Progress Notes (Signed)
RT instructed pt and family on the use of a flutter valve. Pt was able to demonstrate good technique, all questions answered.

## 2022-03-30 NOTE — ED Provider Notes (Signed)
Patient signed out to me at 1500 by Dr. Alvino Chapel pending CT with plans for admission.  In short, patient is a 77 year old female with a past medical history of hypertension and diabetes who was recently discharged from the hospital after treatment for pneumonia with respiratory failure and ureteral stent with as well as currently on treatment with daptomycin through PICC line for an epidural abscess presented to the emergency department with worsening weakness and fatigue.  Patient did have evidence of sepsis here with fever, tachycardia, tachypnea and hypoxia with leukocytosis and elevated lactate of 3 on her labs.  She is currently on high flow nasal cannula at 7 L in no acute respiratory distress.  CT scan does show multifocal pneumonia, stable appearing right-sided hydronephrosis with stent in place.  Urine is still pending at this time.  IR was consulted and recommended Vanco and Rocephin for antibiotics which she has received and the patient will be admitted to the hospitalist for her sepsis.   Kemper Durie, DO 03/30/22 1652

## 2022-03-30 NOTE — ED Triage Notes (Signed)
Patient BIB [facility] from home for evaluation of fatigue after multiple recent hospitalizations and surgeries. Patient recently had a nephrostomy tube and surgical procedure for spinal abscess. Patient alert and oriented at this time.

## 2022-03-31 ENCOUNTER — Encounter (HOSPITAL_COMMUNITY): Payer: Self-pay | Admitting: Internal Medicine

## 2022-03-31 ENCOUNTER — Inpatient Hospital Stay: Payer: Medicare HMO | Admitting: Infectious Diseases

## 2022-03-31 ENCOUNTER — Other Ambulatory Visit: Payer: Self-pay

## 2022-03-31 ENCOUNTER — Inpatient Hospital Stay (HOSPITAL_COMMUNITY): Payer: Medicare HMO

## 2022-03-31 DIAGNOSIS — J8282 Acute eosinophilic pneumonia: Secondary | ICD-10-CM

## 2022-03-31 DIAGNOSIS — A4902 Methicillin resistant Staphylococcus aureus infection, unspecified site: Secondary | ICD-10-CM

## 2022-03-31 DIAGNOSIS — A419 Sepsis, unspecified organism: Secondary | ICD-10-CM | POA: Diagnosis not present

## 2022-03-31 DIAGNOSIS — G062 Extradural and subdural abscess, unspecified: Secondary | ICD-10-CM | POA: Diagnosis not present

## 2022-03-31 DIAGNOSIS — J189 Pneumonia, unspecified organism: Secondary | ICD-10-CM

## 2022-03-31 DIAGNOSIS — E440 Moderate protein-calorie malnutrition: Secondary | ICD-10-CM | POA: Insufficient documentation

## 2022-03-31 DIAGNOSIS — L8990 Pressure ulcer of unspecified site, unspecified stage: Secondary | ICD-10-CM | POA: Insufficient documentation

## 2022-03-31 DIAGNOSIS — J9601 Acute respiratory failure with hypoxia: Secondary | ICD-10-CM | POA: Diagnosis not present

## 2022-03-31 LAB — POCT I-STAT 7, (LYTES, BLD GAS, ICA,H+H)
Acid-base deficit: 5 mmol/L — ABNORMAL HIGH (ref 0.0–2.0)
Bicarbonate: 22.1 mmol/L (ref 20.0–28.0)
Calcium, Ion: 1.26 mmol/L (ref 1.15–1.40)
HCT: 26 % — ABNORMAL LOW (ref 36.0–46.0)
Hemoglobin: 8.8 g/dL — ABNORMAL LOW (ref 12.0–15.0)
O2 Saturation: 100 %
Patient temperature: 98.4
Potassium: 3.6 mmol/L (ref 3.5–5.1)
Sodium: 133 mmol/L — ABNORMAL LOW (ref 135–145)
TCO2: 24 mmol/L (ref 22–32)
pCO2 arterial: 49.2 mmHg — ABNORMAL HIGH (ref 32–48)
pH, Arterial: 7.261 — ABNORMAL LOW (ref 7.35–7.45)
pO2, Arterial: 224 mmHg — ABNORMAL HIGH (ref 83–108)

## 2022-03-31 LAB — RESPIRATORY PANEL BY PCR

## 2022-03-31 LAB — GLUCOSE, CAPILLARY
Glucose-Capillary: 50 mg/dL — ABNORMAL LOW (ref 70–99)
Glucose-Capillary: 100 mg/dL — ABNORMAL HIGH (ref 70–99)
Glucose-Capillary: 88 mg/dL (ref 70–99)
Glucose-Capillary: 87 mg/dL (ref 70–99)
Glucose-Capillary: 157 mg/dL — ABNORMAL HIGH (ref 70–99)
Glucose-Capillary: 131 mg/dL — ABNORMAL HIGH (ref 70–99)
Glucose-Capillary: 166 mg/dL — ABNORMAL HIGH (ref 70–99)
Glucose-Capillary: 201 mg/dL — ABNORMAL HIGH (ref 70–99)

## 2022-03-31 LAB — CBC
HCT: 25.6 % — ABNORMAL LOW (ref 36.0–46.0)
Hemoglobin: 8.6 g/dL — ABNORMAL LOW (ref 12.0–15.0)
MCH: 29 pg (ref 26.0–34.0)
MCHC: 33.6 g/dL (ref 30.0–36.0)
MCV: 86.2 fL (ref 80.0–100.0)
Platelets: 413 10*3/uL — ABNORMAL HIGH (ref 150–400)
RBC: 2.97 MIL/uL — ABNORMAL LOW (ref 3.87–5.11)
RDW: 15.6 % — ABNORMAL HIGH (ref 11.5–15.5)
WBC: 17.9 10*3/uL — ABNORMAL HIGH (ref 4.0–10.5)
nRBC: 0 % (ref 0.0–0.2)

## 2022-03-31 LAB — BASIC METABOLIC PANEL
Anion gap: 10 (ref 5–15)
BUN: 19 mg/dL (ref 8–23)
CO2: 21 mmol/L — ABNORMAL LOW (ref 22–32)
Calcium: 8.3 mg/dL — ABNORMAL LOW (ref 8.9–10.3)
Chloride: 99 mmol/L (ref 98–111)
Creatinine, Ser: 0.82 mg/dL (ref 0.44–1.00)
GFR, Estimated: >60 mL/min (ref >60)
Glucose, Bld: 135 mg/dL — ABNORMAL HIGH (ref 70–99)
Potassium: 3.3 mmol/L — ABNORMAL LOW (ref 3.5–5.1)
Sodium: 130 mmol/L — ABNORMAL LOW (ref 135–145)

## 2022-03-31 LAB — BODY FLUID CELL COUNT WITH DIFFERENTIAL
Eos, Fluid: 17 %
Lymphs, Fluid: 40 %
Monocyte-Macrophage-Serous Fluid: 6 % — ABNORMAL LOW (ref 50–90)
Neutrophil Count, Fluid: 37 % — ABNORMAL HIGH (ref 0–25)
Total Nucleated Cell Count, Fluid: 700 cu mm (ref 0–1000)

## 2022-03-31 LAB — PHOSPHORUS: Phosphorus: 4.1 mg/dL (ref 2.5–4.6)

## 2022-03-31 LAB — MAGNESIUM: Magnesium: 1.6 mg/dL — ABNORMAL LOW (ref 1.7–2.4)

## 2022-03-31 LAB — MRSA NEXT GEN BY PCR, NASAL: MRSA by PCR Next Gen: NOT DETECTED

## 2022-03-31 MED ORDER — CHLORHEXIDINE GLUCONATE CLOTH 2 % EX PADS
6.0000 | MEDICATED_PAD | Freq: Every day | CUTANEOUS | Status: DC
  Administered 2022-03-31 – 2022-04-06 (×7): 6 via TOPICAL

## 2022-03-31 MED ORDER — FENTANYL 2500MCG IN NS 250ML (10MCG/ML) PREMIX INFUSION
0.0000 ug/h | INTRAVENOUS | Status: DC
  Administered 2022-03-31: 25 ug/h via INTRAVENOUS
  Administered 2022-04-01: 100 ug/h via INTRAVENOUS
  Administered 2022-04-01: 125 ug/h via INTRAVENOUS
  Administered 2022-04-02: 200 ug/h via INTRAVENOUS
  Administered 2022-04-03: 125 ug/h via INTRAVENOUS
  Administered 2022-04-03 – 2022-04-04 (×2): 200 ug/h via INTRAVENOUS
  Filled 2022-03-31 (×7): qty 250

## 2022-03-31 MED ORDER — FENTANYL CITRATE PF 50 MCG/ML IJ SOSY: 25.0000 ug | PREFILLED_SYRINGE | INTRAMUSCULAR | Status: DC | PRN

## 2022-03-31 MED ORDER — VITAL 1.5 CAL PO LIQD
1000.0000 mL | ORAL | Status: DC
  Administered 2022-03-31 – 2022-04-05 (×6): 1000 mL

## 2022-03-31 MED ORDER — POTASSIUM CHLORIDE 10 MEQ/100ML IV SOLN: 10.0000 meq | INTRAVENOUS | Status: DC

## 2022-03-31 MED ORDER — SUCCINYLCHOLINE CHLORIDE 200 MG/10ML IV SOSY
PREFILLED_SYRINGE | INTRAVENOUS | Status: AC
  Filled 2022-03-31: qty 10

## 2022-03-31 MED ORDER — KETAMINE HCL 50 MG/5ML IJ SOSY
PREFILLED_SYRINGE | INTRAMUSCULAR | Status: AC
  Filled 2022-03-31: qty 10

## 2022-03-31 MED ORDER — MIDAZOLAM HCL 2 MG/2ML IJ SOLN
1.0000 mg | INTRAMUSCULAR | Status: DC | PRN
  Administered 2022-03-31: 1 mg via INTRAVENOUS
  Administered 2022-04-02: 2 mg via INTRAVENOUS
  Filled 2022-03-31 (×3): qty 2

## 2022-03-31 MED ORDER — FENTANYL CITRATE PF 50 MCG/ML IJ SOSY
25.0000 ug | PREFILLED_SYRINGE | Freq: Once | INTRAMUSCULAR | Status: AC
  Administered 2022-03-31: 25 ug via INTRAVENOUS

## 2022-03-31 MED ORDER — SODIUM BICARBONATE 8.4 % IV SOLN
INTRAVENOUS | Status: AC
  Filled 2022-03-31: qty 100

## 2022-03-31 MED ORDER — GADOBUTROL 1 MMOL/ML IV SOLN
7.0000 mL | Freq: Once | INTRAVENOUS | Status: AC | PRN
  Administered 2022-03-31: 7 mL via INTRAVENOUS

## 2022-03-31 MED ORDER — PANTOPRAZOLE SODIUM 40 MG IV SOLR
40.0000 mg | Freq: Every day | INTRAVENOUS | Status: DC
  Administered 2022-03-31 – 2022-04-05 (×6): 40 mg via INTRAVENOUS
  Filled 2022-03-31 (×6): qty 10

## 2022-03-31 MED ORDER — FENTANYL CITRATE PF 50 MCG/ML IJ SOSY
PREFILLED_SYRINGE | INTRAMUSCULAR | Status: AC
  Administered 2022-03-31: 50 ug via INTRAVENOUS
  Filled 2022-03-31: qty 2

## 2022-03-31 MED ORDER — FENTANYL BOLUS VIA INFUSION
25.0000 ug | INTRAVENOUS | Status: DC | PRN
  Administered 2022-03-31 (×3): 50 ug via INTRAVENOUS
  Administered 2022-03-31: 25 ug via INTRAVENOUS
  Administered 2022-04-01: 100 ug via INTRAVENOUS
  Administered 2022-04-01 (×4): 50 ug via INTRAVENOUS
  Administered 2022-04-01: 100 ug via INTRAVENOUS
  Administered 2022-04-02: 75 ug via INTRAVENOUS
  Administered 2022-04-02: 100 ug via INTRAVENOUS
  Administered 2022-04-02 (×2): 75 ug via INTRAVENOUS
  Administered 2022-04-02: 100 ug via INTRAVENOUS
  Administered 2022-04-02 – 2022-04-03 (×2): 50 ug via INTRAVENOUS
  Administered 2022-04-03 – 2022-04-04 (×7): 100 ug via INTRAVENOUS

## 2022-03-31 MED ORDER — PROSOURCE TF20 ENFIT COMPATIBL EN LIQD
60.0000 mL | Freq: Every day | ENTERAL | Status: DC
  Administered 2022-03-31 – 2022-04-06 (×7): 60 mL
  Filled 2022-03-31 (×7): qty 60

## 2022-03-31 MED ORDER — SODIUM CHLORIDE 0.9 % IV SOLN: INTRAVENOUS | Status: DC | PRN

## 2022-03-31 MED ORDER — ORAL CARE MOUTH RINSE
15.0000 mL | OROMUCOSAL | Status: DC
  Administered 2022-03-31 – 2022-04-06 (×71): 15 mL via OROMUCOSAL

## 2022-03-31 MED ORDER — MAGNESIUM SULFATE 4 GM/100ML IV SOLN
4.0000 g | Freq: Once | INTRAVENOUS | Status: AC
  Administered 2022-03-31: 4 g via INTRAVENOUS
  Filled 2022-03-31: qty 100

## 2022-03-31 MED ORDER — POTASSIUM CHLORIDE 10 MEQ/50ML IV SOLN
10.0000 meq | INTRAVENOUS | Status: AC
  Administered 2022-03-31 (×4): 10 meq via INTRAVENOUS
  Filled 2022-03-31: qty 50

## 2022-03-31 MED ORDER — MIDAZOLAM HCL 2 MG/2ML IJ SOLN
INTRAMUSCULAR | Status: AC
  Administered 2022-03-31: 2 mg
  Filled 2022-03-31: qty 2

## 2022-03-31 MED ORDER — ROCURONIUM BROMIDE 10 MG/ML (PF) SYRINGE
PREFILLED_SYRINGE | INTRAVENOUS | Status: AC
  Administered 2022-03-31: 60 mg
  Filled 2022-03-31: qty 10

## 2022-03-31 MED ORDER — ETOMIDATE 2 MG/ML IV SOLN
INTRAVENOUS | Status: AC
  Filled 2022-03-31: qty 20

## 2022-03-31 MED ORDER — ROCURONIUM BROMIDE 10 MG/ML (PF) SYRINGE
PREFILLED_SYRINGE | INTRAVENOUS | Status: AC
  Filled 2022-03-31: qty 10

## 2022-03-31 MED ORDER — ORAL CARE MOUTH RINSE: 15.0000 mL | OROMUCOSAL | Status: DC | PRN

## 2022-03-31 MED ORDER — ETOMIDATE 2 MG/ML IV SOLN
INTRAVENOUS | Status: AC
  Administered 2022-03-31: 20 mg
  Filled 2022-03-31: qty 10

## 2022-03-31 MED ORDER — SODIUM CHLORIDE 0.9% FLUSH: 10.0000 mL | INTRAVENOUS | Status: DC | PRN

## 2022-03-31 MED ORDER — HYDROCORTISONE SOD SUC (PF) 250 MG IJ SOLR
200.0000 mg | Freq: Every day | INTRAMUSCULAR | Status: DC
  Administered 2022-03-31: 200 mg via INTRAVENOUS
  Filled 2022-03-31 (×2): qty 200

## 2022-03-31 MED ORDER — DOCUSATE SODIUM 50 MG/5ML PO LIQD
100.0000 mg | Freq: Two times a day (BID) | ORAL | Status: DC
  Administered 2022-03-31 – 2022-04-05 (×10): 100 mg
  Filled 2022-03-31 (×11): qty 10

## 2022-03-31 MED ORDER — POLYETHYLENE GLYCOL 3350 17 G PO PACK
17.0000 g | PACK | Freq: Every day | ORAL | Status: DC
  Administered 2022-04-01 – 2022-04-02 (×2): 17 g
  Filled 2022-03-31 (×2): qty 1

## 2022-03-31 MED ORDER — SODIUM CHLORIDE 0.9% FLUSH
10.0000 mL | Freq: Two times a day (BID) | INTRAVENOUS | Status: DC
  Administered 2022-03-31 – 2022-04-06 (×10): 10 mL

## 2022-03-31 MED ORDER — DEXTROSE 50 % IV SOLN
INTRAVENOUS | Status: AC
  Administered 2022-03-31: 50 mL
  Filled 2022-03-31: qty 50

## 2022-03-31 MED ORDER — POTASSIUM CHLORIDE 20 MEQ PO PACK
20.0000 meq | PACK | ORAL | Status: AC
  Administered 2022-03-31 (×2): 20 meq
  Filled 2022-03-31 (×2): qty 1

## 2022-03-31 MED ORDER — INSULIN ASPART 100 UNIT/ML IJ SOLN
0.0000 [IU] | INTRAMUSCULAR | Status: DC
  Administered 2022-03-31: 2 [IU] via SUBCUTANEOUS
  Administered 2022-03-31 (×3): 3 [IU] via SUBCUTANEOUS
  Administered 2022-04-01: 8 [IU] via SUBCUTANEOUS
  Administered 2022-04-01: 5 [IU] via SUBCUTANEOUS
  Administered 2022-04-01 (×2): 8 [IU] via SUBCUTANEOUS
  Administered 2022-04-01 – 2022-04-02 (×3): 5 [IU] via SUBCUTANEOUS
  Administered 2022-04-02: 8 [IU] via SUBCUTANEOUS
  Administered 2022-04-02: 5 [IU] via SUBCUTANEOUS

## 2022-03-31 MED ORDER — DEXTROSE-NACL 5-0.9 % IV SOLN: INTRAVENOUS | Status: DC

## 2022-03-31 MED ORDER — DEXTROSE 50 % IV SOLN: 25.0000 g | INTRAVENOUS | Status: AC

## 2022-03-31 NOTE — Progress Notes (Signed)
[facility] Progress Note Patient Name: Melissa Leon DOB: 1945/09/25 MRN: [id number]   Date of Service  03/31/2022  HPI/Events of Note  Mg++ 1.6  eICU Interventions  Magnesium 4 gm iv bolus x 1 ordered.        Frederik Pear 03/31/2022, 8:41 PM

## 2022-03-31 NOTE — Consult Note (Signed)
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 L3 L5 evacuation and hemilaminectomy and medial facetectomy History of nephrostomy tube placement and stent HTN  Plan:  Agree with intubation by CCM and if possible bronchoscopy, followed by high dose steroids  Will continue vancomycin and ceftriaxone for now Will follow-up bronchoscopy results culture data, could send RVP, fungal culture and AFB --but this really does not look like a NTM, similarly would be unlikley  to be PCP--this is assuredly eosinophilic PNA MRI L spine when able  Principal Problem:   Sepsis ([place]) Active Problems:   Sepsis without acute organ dysfunction ([place])   Pneumonia due to infectious organism   Acute respiratory failure with hypoxia ([facility])   Scheduled Meds:  aspirin  81 mg Oral QHS   Chlorhexidine Gluconate Cloth  6 each Topical Daily   dextrose  25 g Intravenous STAT   docusate  100 mg Per Tube BID   enoxaparin (LOVENOX) injection  40 mg Subcutaneous Q24H   guaiFENesin  1,200 mg Oral BID   insulin aspart  0-15 Units Subcutaneous TID WC   polyethylene glycol  17 g Per Tube Daily   sodium bicarbonate       Continuous Infusions:  cefTRIAXone (ROCEPHIN)  IV     dextrose 5 % and 0.9% NaCl 75 mL/hr at 03/31/22 0948   fentaNYL infusion INTRAVENOUS 25 mcg/hr (03/31/22 0956)   vancomycin     PRN Meds:.bisacodyl, fentaNYL,  hydrALAZINE, ipratropium-albuterol, midazolam, ondansetron **OR** ondansetron (ZOFRAN) IV, senna-docusate, sodium bicarbonate  HPI: Melissa Leon is a 77 y.o. 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  There was mention of UTI but she only grew small colony count of candida and her symptoms have been clearly respiratory in nature.  She improved in the hospital while on vancomycin and Levaquin but also notably off daptomycin.  She was then discharged and resume daptomycin at home.  She had worsening of symptoms of worsening malaise fatigue fevers , and was found to be hypothermic and brought to the hospital she was complaining of shortness of breath and cough.  In the Emergency Department she had blood cultures drawn as well as CT chest abdomen pelvis with the latter showing multifocal infiltrates and bilateral pleural effusions.  Dr. Alvino Chapel called me from the ER and I recommended stopping the daptomycin as I had concerns that this could be eosinophilic pneumonia from daptomycin I recommended initiation of vancomycin and ceftriaxone.  In the interim the patient's respiratory status is further declined, with tachypnea and accessory muscle use.  She is going to be intubated by pulmonary critical care with plans for bronchoscopy BAL.  I think is reasonable send off cultures from the BAL including bacterial, fungal AFB though this does not at all seem like a nontuberculous mycobacterial infection.  1 could also send off  respiratory viral panel if needed desired her panel was negative when she was in the hospital recently.  I think think she needs most besides mechanical support is corticosteroids.  Will plan on continuing vancomycin and ceftriaxone pending her culture data and if her BAL cultures are  unrevealing narrow to vancomycin to treat her spine infection.  I do think she should have repeat imaging of her back given her back pain has worsened recently  I spent 84 minutes with the patient including than 50% of the time in face to face counseling of the patient re her multifocal PNA with suspicion for eosinophilic pna, epidural abscess, personally reviewing CT chest abdomen and pelvis, along with review of medical records in preparation for the visit and during the visit and in coordination of her care.    Review of Systems: Review of Systems  Constitutional:  Positive for fever. Negative for chills, malaise/fatigue and weight loss.  HENT:  Negative for congestion and sore throat.   Eyes:  Negative for blurred vision and photophobia.  Respiratory:  Positive for cough and shortness of breath. Negative for wheezing.   Cardiovascular:  Positive for chest pain. Negative for palpitations and leg swelling.  Gastrointestinal:  Negative for abdominal pain, blood in stool, constipation, diarrhea, heartburn, melena, nausea and vomiting.  Genitourinary:  Negative for dysuria, flank pain and hematuria.  Musculoskeletal:  Positive for back pain. Negative for falls, joint pain and myalgias.  Skin:  Negative for itching and rash.  Neurological:  Negative for dizziness, focal weakness, loss of consciousness, weakness and headaches.  Endo/Heme/Allergies:  Does not bruise/bleed easily.  Psychiatric/Behavioral:  Negative for depression and suicidal ideas. The patient does not have insomnia.     Past Medical History:  Diagnosis Date   Anemia    Arthritis    Depression    Diabetes mellitus ([place])    Hypertension    Pneumonia    Spinal stenosis     Social History   Tobacco Use   Smoking status: Former    Packs/day: 0.25    Years: 2.00    Total pack years: 0.50    Types: Cigarettes   Smokeless tobacco: Never  Vaping Use   Vaping Use: Never used  Substance Use Topics   Alcohol use: Not  Currently   Drug use: Never    Family History  Problem Relation Age of Onset   Breast cancer Neg Hx    Allergies  Allergen Reactions   Choline Fenofibrate Other (See Comments)    ELEVATED LFT.    Penicillins Hives   Ozempic (0.25 Or 0.5 Mg-Dose) [Semaglutide(0.25 Or 0.'5mg'$ -Dos)] Nausea Only   Statins Other (See Comments)    Aches, pain    OBJECTIVE: Blood pressure 138/76, pulse 93, temperature 98.4 F (36.9 C), temperature source Oral, resp. rate 15, height '5\' 7"'$  (1.702 m), SpO2 98 %.  Physical Exam Constitutional:      General: She is not in acute distress.    Appearance: Normal appearance. She is well-developed. She is not ill-appearing or diaphoretic.  HENT:     Head: Normocephalic and atraumatic.     Right Ear: Hearing and external ear normal.     Left Ear: Hearing and external ear normal.     Nose: No nasal deformity or rhinorrhea.  Eyes:     General: No scleral icterus.    Conjunctiva/sclera: Conjunctivae normal.     Right eye: Right conjunctiva is not injected.     Left eye: Left conjunctiva is not injected.  Pupils: Pupils are equal, round, and reactive to light.  Neck:     Vascular: No JVD.  Cardiovascular:     Rate and Rhythm: Regular rhythm. Tachycardia present.     Heart sounds: Normal heart sounds, S1 normal and S2 normal. No murmur heard.    No friction rub. No gallop.  Pulmonary:     Effort: Tachypnea, accessory muscle usage and respiratory distress present.  Abdominal:     General: Bowel sounds are normal. There is no distension.     Palpations: Abdomen is soft.     Tenderness: There is no abdominal tenderness.  Musculoskeletal:        General: Normal range of motion.     Right shoulder: Normal.     Left shoulder: Normal.     Cervical back: Normal range of motion and neck supple.     Right hip: Normal.     Left hip: Normal.     Right knee: Normal.     Left knee: Normal.  Lymphadenopathy:     Head:     Right side of head: No submandibular,  preauricular or posterior auricular adenopathy.     Left side of head: No submandibular, preauricular or posterior auricular adenopathy.     Cervical: No cervical adenopathy.     Right cervical: No superficial or deep cervical adenopathy.    Left cervical: No superficial or deep cervical adenopathy.  Skin:    General: Skin is warm and dry.     Coloration: Skin is not pale.     Findings: No abrasion, bruising, ecchymosis, erythema, lesion or rash.     Nails: There is no clubbing.  Neurological:     General: No focal deficit present.     Mental Status: She is alert and oriented to person, place, and time.     Sensory: No sensory deficit.     Coordination: Coordination normal.     Gait: Gait normal.  Psychiatric:        Attention and Perception: Attention normal. She is attentive.        Mood and Affect: Mood is anxious.        Speech: Speech normal.        Behavior: Behavior normal. Behavior is cooperative.        Thought Content: Thought content normal.        Judgment: Judgment normal.     Lab Results Lab Results  Component Value Date   WBC 17.9 (H) 03/31/2022   HGB 8.6 (L) 03/31/2022   HCT 25.6 (L) 03/31/2022   MCV 86.2 03/31/2022   PLT 413 (H) 03/31/2022    Lab Results  Component Value Date   CREATININE 0.82 03/31/2022   BUN 19 03/31/2022   NA 130 (L) 03/31/2022   K 3.3 (L) 03/31/2022   CL 99 03/31/2022   CO2 21 (L) 03/31/2022    Lab Results  Component Value Date   ALT 55 (H) 03/30/2022   AST 53 (H) 03/30/2022   ALKPHOS 117 03/30/2022   BILITOT 1.0 03/30/2022     Microbiology: Recent Results (from the past 240 hour(s))  Culture, blood (routine x 2)     Status: None (Preliminary result)   Collection Time: 03/30/22 12:18 PM   Specimen: BLOOD  Result Value Ref Range Status   Specimen Description BLOOD LEFT ANTECUBITAL  Final   Special Requests   Final    BOTTLES DRAWN AEROBIC AND ANAEROBIC Blood Culture adequate volume   Culture   Final  NO GROWTH < 24  HOURS Performed at [facility] [address] [place], [place] [address]    Report Status PENDING  Incomplete  Culture, blood (routine x 2)     Status: None (Preliminary result)   Collection Time: 03/30/22  7:42 PM   Specimen: BLOOD  Result Value Ref Range Status   Specimen Description BLOOD LEFT ANTECUBITAL  Final   Special Requests   Final    BOTTLES DRAWN AEROBIC AND ANAEROBIC Blood Culture results may not be optimal due to an inadequate volume of blood received in culture bottles   Culture   Final    NO GROWTH < 12 HOURS Performed at [facility], [place] [address] [place], [place] [address]    Report Status PENDING  Incomplete    Alcide Evener, [facility] [phone] pager  03/31/2022, 9:57 AM

## 2022-03-31 NOTE — Progress Notes (Signed)
PT Cancellation Note  Patient Details Name: Melissa Leon MRN: [id number] DOB: 1945/02/19   Cancelled Treatment:    Reason Eval/Treat Not Completed: Patient not medically ready Per nurse, pt not medically stable at this time due to respiration, having trouble breathing and to be transferred to higher level of care. Will follow when appropriate.  Marguarite Arbour A Adrick Kestler 03/31/2022, 8:47 AM Marisa Severin, PT, DPT Acute Rehabilitation Services Secure chat preferred Office [phone]

## 2022-03-31 NOTE — Progress Notes (Signed)
Initial Nutrition Assessment  DOCUMENTATION CODES:   Non-severe (moderate) malnutrition in context of chronic illness  INTERVENTION:   Initiate tube feeds via OG tube: - Start Vital 1.5 @ 20 ml/hr and advance rate by 10 ml q 8 hours to goal rate of 50 ml/hr (1200 ml/day) - PROSource TF20 60 ml daily  Tube feeding regimen at goal rate provides 1880 kcal, 101 grams of protein, and 917 ml of H2O.   Monitor magnesium, potassium, and phosphorus q 12 hours for at least 6 occurrences, MD to replete as needed, as pt is at risk for refeeding syndrome given moderate malnutrition.  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  GOAL:   Patient will meet greater than or equal to 90% of their needs  MONITOR:   Vent status, Labs, Weight trends, TF tolerance, I & O's  REASON FOR ASSESSMENT:   Ventilator, Consult Enteral/tube feeding initiation and management  ASSESSMENT:   77 year old female who presented to the ED on 3/11 with fatigue. 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 Pt admitted with severe sepsis, acute respiratory failure secondary to pneumonia.  03/12 - intubated  Discussed pt with RN and during ICU rounds. Consult received for enteral nutrition initiation and management. Pt with OG tube in stomach per x-ray.  Spoke with pt's brother and daughter at bedside. Family reports that pt has had decreased appetite and decreased PO intake for many months. Pt's daughter reports that this likely started when pt started taking Ozempic in spring of 2023. Pt experienced nausea as a side effect of Ozempic and had decreased PO intake because of this. Pt's daughter states that pt was  eating "very little." In November, pt stopped taking Ozempic but continued to have decreased appetite and decreased PO intake due to illness and hospitalizations. Pt's brother, who has been caring for pt for the last 3 weeks, reports that pt had just recently started feeling hungry over the last few days and had been requesting foods like pasta, chicken soup, and ice cream.  Pt's family endorses weight loss. Suspect weight loss is related both to Ozempic use and to poor PO intake even after stopping Ozempic. Pt's daughter reports first noticing pt losing weight in April/May 2023. Pt's UBW was 190 lbs. Her lowest weight is her current weight. Reviewed weight history in chart. Pt with a total weight loss of 12.6 kg from 06/03/21 to 03/25/22. This is a 15% weight loss in 10 months which is not clinically significant for timeframe but is concerning. Current weight is 76 kg which is up from lowest weight of 71.3 kg on 03/25/22. Suspect current weight is falsely elevated due to edema.  Family reports that pt has been having issues with swallowing recently. They suspect pt has been aspirating.  Pt's family reports that pt ambulates with a walker at baseline. Over the last 1-2 weeks, her mobility has been reduced due to weakness and a recent hospital admission for PNA.  Pt's brother reports concern regarding pt's bowel movements. Pt has been having issues with constipation since her most recent colonoscopy in spring 2023. Pt's brother states that pt has not had a BM in 3 days. Abdomen distended but soft on exam. RN has documented active bowel sounds.  Discussed plan to start  enteral nutrition via OG tube with pt's family and with RN. Discussed plan to start at trickle rate and slowly advance to goal while monitoring for tolerance and refeeding. Orders in place.  Patient is currently intubated on ventilator support Temp (24hrs), Avg:98.7 F (37.1 C), Min:98.3 F (36.8 C), Max:99.2 F (37.3  C)  Drips: Fentanyl D5-NS: 75 ml/hr  Medications reviewed and include: colace, SSI q 4 hours, miralax, klor-con 20 mEq x 2, IV abx, IV KCl 10 mEq x 4  Labs reviewed: sodium 133, WBC 17.9, hemoglobin 8.8 CBG's: 34-157 x 24 hours  I/O's: +1.1 L since admit  NUTRITION - FOCUSED PHYSICAL EXAM:  Flowsheet Row Most Recent Value  Orbital Region Mild depletion  Upper Arm Region Mild depletion  Thoracic and Lumbar Region Mild depletion  Buccal Region Unable to assess  Temple Region Mild depletion  Clavicle Bone Region Moderate depletion  Clavicle and Acromion Bone Region Moderate depletion  Scapular Bone Region Moderate depletion  Dorsal Hand Mild depletion  Patellar Region Moderate depletion  Anterior Thigh Region Moderate depletion  Posterior Calf Region Moderate depletion  Edema (RD Assessment) Mild  Hair Reviewed  Eyes Reviewed  Mouth Reviewed  Skin Reviewed  Nails Reviewed       Diet Order:   Diet Order             Diet NPO time specified Except for: Sips with Meds  Diet effective now                   EDUCATION NEEDS:   No education needs have been identified at this time  Skin:  Skin Assessment: Skin Integrity Issues: Stage I: sacrum  Last BM:  03/29/22  Height:   Ht Readings from Last 1 Encounters:  03/31/22 '5\' 7"'$  (1.702 m)    Weight:   Wt Readings from Last 1 Encounters:  03/31/22 76 kg    BMI:  Body mass index is 26.24 kg/m.  Estimated Nutritional Needs:   Kcal:  1700-1900  Protein:  85-100 grams  Fluid:  1.7-1.9 L    Gustavus Bryant, MS, RD, LDN Inpatient Clinical Dietitian Please see [facility] for contact information.

## 2022-03-31 NOTE — Progress Notes (Signed)
SLP Cancellation Note  Patient Details Name: Melissa Leon MRN: [id number] DOB: 02-04-1945   Cancelled treatment:       Reason Eval/Treat Not Completed: Medical issues which prohibited therapy;Other (comment) (patient intubated today 03/31/22)   Sonia Baller, MA, CCC-SLP Speech Therapy

## 2022-03-31 NOTE — Consult Note (Signed)
NAME:  Melissa Leon, MRN:  [id number], DOB:  Feb 27, 1945, LOS: 1 ADMISSION DATE:  03/30/2022, CONSULTATION DATE:  03/31/2022 REFERRING MD:  Dr. 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   Pt initially was seen in 12/2021 for E. Coli UTI with severe hydronephrosis on the right secondary to ureteral obstruction requiring percutaneous nephrostomy tube placement. She underwent ureteral stent placement in 02/2022 after multiple rounds of antibiotics. Later in 02/2022 she was found to have a lumbar epidural abscess which was operated on and grew MRSA. TTE at that time without vegetations noted. She was discharged with a RUE PICC line on IV daptomycin started 03/06/2022 with planned 8 weeks of therapy to end on 04/12/2022. Following discharge she was admitted again with pneumonia on 03/19/2022. Daptomycin was held during that admission and restarted on discharge 03/25/2022. She was again admitted on 03/30/2022 with generalized weakness, hypothermia, cough, and dyspnea. She was found to have worsening infiltrates bilaterally on CXR and CT. With concerns for daptomycin induced eosinophilic pneumonia she was switched to ceftriaxone and vancomycin.   She has had progressive respiratory decline and is now on maximal support with HFNC 40L at 100% FiO2. She continues to have increased work of breathing, tachypnea, and diaphragmatic paradoxus while maintaining O2 sats of 96. Blood cultures are preliminarily negative. She is having worsening back pain similar to her prior lumbar abscess. She does not have other acute complaints.   Pertinent  Medical History  T2DM HTN HFpEF Spinal Stenosis Chronic IV antibiotic therapy with  daptomycin after MRSA+ lumbar spinal abscess R Ureteral obstruction with resultant hydronephrosis and pyelonephritis s/p JJ stent   Significant Hospital Events: Including procedures, antibiotic start and stop dates in addition to other pertinent events   3/11 admitted with sepsis and worsening multifocal pneumonia on chronic daptomycin, changed to ceftriaxone and vancomycin 3/12 transfer to MICU for intubation 2/2 progressive respiratory distress on HFNC 40L 100% FiO2  Interim History / Subjective:  Pt is having difficulty breathing and speaking in full sentences. She does have some stable numbness in her bilateral lower extremities but worsening low back pain. She has no other acute complaints and is agreeable to plan for ICU transfer for intubation.   Objective   Blood pressure 136/65, pulse 99, temperature 98.4 F (36.9 C), temperature source Oral, resp. rate 19, SpO2 100 %.    FiO2 (%):  [100 %] 100 %   Intake/Output Summary (Last 24 hours) at 03/31/2022 0839 Last data filed at 03/31/2022 0323 Gross per 24 hour  Intake --  Output 600 ml  Net -600 ml   There were no vitals filed for this visit.  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 vs progressive multifocal pneumonia Sepsis -transfer to ICU for intubation and BAL with labs (cell count, culture, cytology, RVP, AFB, fungal culture, pneumocystis)  -no airborne precautions, labs sent for thoroughness -ID following, continue vanc and  ctx  -ABG, post intubation  CXR -versed/fentanyl gtt   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 on prolonged daptomycin Spinal Stenosis -MRI L spine -ID following, abx above -monitor admission blood cultures  Hx of R hydronephrosis 2/2 ureteral obstruction s/p stent placement -CT here without change in stable right hydronephrosis and stent in place -no urinary symptoms  HFpEF CAD HTN -stable, will monitor fluid status -strict ins and outs -holding antihypertensives with low pressures -aspirin 81 mg daily -PRN hydralazine for SPB>150  T2DM -SSI TID WC  Best Practice (right click and "Reselect all SmartList Selections" daily)   Diet/type: NPO DVT prophylaxis: LMWH GI prophylaxis: N/A Lines: N/A Foley:  N/A Code Status:  full code Last date of multidisciplinary goals of care discussion [3/12]  Labs   CBC: Recent Labs  Lab 03/25/22 0340 03/30/22 1218 03/31/22 0208  WBC 14.4* 16.9* 17.9*  NEUTROABS  --  15.5*  --   HGB 8.5* 10.4* 8.6*  HCT 26.5* 32.0* 25.6*  MCV 89.5 88.4 86.2  PLT 428* 498* 413*    Basic Metabolic Panel: Recent Labs  Lab 03/25/22 0340 03/30/22 1218 03/31/22 0208  NA 135 131* 130*  K 3.6 3.7 3.3*  CL 102 96* 99  CO2 25 19* 21*  GLUCOSE 180* 96 135*  BUN 37* 25* 19  CREATININE 1.01* 0.89 0.82  CALCIUM 8.9 9.5 8.3*   GFR: Estimated Creatinine Clearance: 56.8 mL/min (by C-G formula based on SCr of 0.82 mg/dL). Recent Labs  Lab 03/25/22 0340 03/30/22 1218 03/30/22 1941 03/31/22 0208  WBC 14.4* 16.9*  --  17.9*  LATICACIDVEN  --  3.3* 1.1  --     Liver Function Tests: Recent Labs  Lab 03/30/22 1218  AST 53*  ALT 55*  ALKPHOS 117  BILITOT 1.0  PROT 7.1  ALBUMIN 2.4*   No results for input(s): "LIPASE", "AMYLASE" in the last 168 hours. No results for input(s): "AMMONIA" in the last 168 hours.  ABG    Component Value Date/Time   PHART 7.48 (H) 03/22/2022 1630   PCO2ART 33 03/22/2022 1630   PO2ART 69 (L) 03/22/2022  1630   HCO3 24.6 03/22/2022 1630   O2SAT 95.6 03/22/2022 1630     Coagulation Profile: No results for input(s): "INR", "PROTIME" in the last 168 hours.  Cardiac Enzymes: No results for input(s): "CKTOTAL", "CKMB", "CKMBINDEX", "TROPONINI" in the last 168 hours.  HbA1C: Hgb A1c MFr Bld  Date/Time Value Ref Range Status  01/04/2022 12:57 PM 5.6 4.8 - 5.6 % Final    Comment:    (NOTE)         Prediabetes: 5.7 - 6.4         Diabetes: >6.4         Glycemic control for adults with diabetes: <7.0     CBG: Recent Labs  Lab 03/30/22 2217 03/30/22 2316 03/31/22 0108 03/31/22 0339 03/31/22 0723  GLUCAP 34* 78 50* 100* 88    Review of Systems:   Dyspnea, back pain  Past Medical History:  She,  has a past medical history of Anemia, Arthritis, Depression, Diabetes mellitus ([place]), Hypertension, Pneumonia, and Spinal stenosis.   Surgical History:   Past Surgical History:  Procedure Laterality Date   APPENDECTOMY     CHOLECYSTECTOMY     CYSTOSCOPY WITH RETROGRADE PYELOGRAM, URETEROSCOPY AND STENT PLACEMENT Right 02/27/2022   Procedure: TURBT, CYSTOSCOPY WITH RIGHT RETROGRADE PYELOGRAM, RIGHT URETEROSCOPY, BIOPSY AND STENT PLACEMENT;  Surgeon: Janith Lima, MD;  Location: [facility];  Service: Urology;  Laterality: Right;  30 MINUTES NEEDED FOR CASE   IR NEPHROSTOMY PLACEMENT RIGHT  01/05/2022   LUMBAR LAMINECTOMY FOR EPIDURAL ABSCESS N/A 03/01/2022   Procedure: LUMBAR LAMINECTOMY LUMBAR THREE-FOUR, LUMBAR FOUR- FIVE FOR EPIDURAL ABSCESS;  Surgeon: Eustace Moore, MD;  Location: [facility];  Service: Neurosurgery;  Laterality: N/A;   UTERINE FIBROID SURGERY     WRIST GANGLION EXCISION Right      Social History:   reports that she has quit smoking. Her smoking use included cigarettes. She has a 0.50 pack-year smoking history. She has never used smokeless tobacco. She reports that she does not currently use alcohol. She reports that she does not use drugs.   Family History:  Her family  history is negative for Breast cancer.   Allergies Allergies  Allergen Reactions   Choline Fenofibrate Other (See Comments)    ELEVATED LFT.    Penicillins Hives   Ozempic (0.25 Or 0.5 Mg-Dose) [Semaglutide(0.25 Or 0.'5mg'$ -Dos)] Nausea Only   Statins Other (See Comments)    Aches, pain     Home Medications  Prior to Admission medications   Medication Sig Start Date End Date Taking? Authorizing Provider  acetaminophen (TYLENOL) 650 MG CR tablet Take 1,300 mg by mouth daily as needed for pain.   Yes [provider]  amLODipine (NORVASC) 5 MG tablet Take 5 mg by mouth at bedtime. 03/27/21  Yes [provider]  aspirin 81 MG chewable tablet Chew 81 mg by mouth at bedtime.   Yes [provider]  daptomycin (CUBICIN) IVPB Inject 650 mg into the vein daily for 20 days. Indication:  MRSA epidural abscess First Dose: Yes Last Day of Therapy:  04/12/22 Labs - Once weekly:  CBC/D, BMP, and CPK Labs - Every other week:  ESR and CRP Method of administration: IV Push Pull PICC line at the completion of IV antibiotics Method of administration may be changed at the discretion of home infusion pharmacist based upon assessment of the patient and/or caregiver's ability to self-administer the medication ordered. 03/25/22 04/14/22 Yes Pokhrel, Corrie Mckusick, MD  flavoxATE (URISPAS) 100 MG tablet Take 1 tablet (100 mg total) by mouth 3 (three) times daily as needed for bladder spasms. 03/25/22  Yes Pokhrel, Laxman, MD  glimepiride (AMARYL) 4 MG tablet Take 4 mg by mouth daily. 01/28/22  Yes [provider]  hydrochlorothiazide (HYDRODIURIL) 25 MG tablet Take 25 mg by mouth daily. 01/15/22  Yes [provider]  losartan (COZAAR) 100 MG tablet Take 100 mg by mouth in the morning. 04/21/21  Yes [provider]  metFORMIN (GLUCOPHAGE-XR) 500 MG 24 hr tablet Take 1,000 mg by mouth 2 (two) times daily with a meal. 03/27/21  Yes [provider]     Critical care time:  22

## 2022-03-31 NOTE — Progress Notes (Signed)
Patient transported to MRI and back to 3M07 without event.

## 2022-03-31 NOTE — Progress Notes (Signed)
Pharmacy Electrolyte Replacement  Recent Labs:  Recent Labs    03/31/22 0208  K 3.3*  CREATININE 0.82    Low Critical Values (K </= 2.5, Phos </= 1, Mg </= 1) Present: None  MD Contacted: Dr. Vaughan Browner  Plan:  KCL 20 mEq per tube Q4h X 2 doses KCL 10 mEq IV Q1h X 4 doses Follow up Mg, phos and K with morning labs

## 2022-03-31 NOTE — Progress Notes (Signed)
Patient c/o shortness of breath and desaturation issues. Breath sounds scattered crackles. Patient on 12lpm salter cannula with Sp02=84-87%. Placed patient on heated high flow cannula at 40lpm ans 100% Fio2. Sp02 level increased to 97-100% with a decrease in work of breathing. Patient stated she was breathing easier after change.

## 2022-03-31 NOTE — Consult Note (Addendum)
WOC consult requested for Stage 1 pressure injury to sacrum; performed remotely. These can be treated independently by the bedside nurse using the skin care order set in Epic; Foam dressing to sacrum, change Q 3 days or PRN soiling. Please re-consult if further assistance is needed.  Thank-you,  Julien Girt MSN, [employer], [place], [place], [place]

## 2022-03-31 NOTE — Procedures (Signed)
Intubation Procedure Note  Melissa Leon  [id number]  08/16/1945  Date:03/31/22  Time:10:09 AM   Provider Performing:Shawana Knoch    Procedure: Intubation ([id number])  Indication(s) Respiratory Failure  Consent Risks of the procedure as well as the alternatives and risks of each were explained to the patient and/or caregiver.  Consent for the procedure was obtained and is signed in the bedside chart   Anesthesia Etomidate, Versed, Fentanyl, and Rocuronium   Time Out Verified patient identification, verified procedure, site/side was marked, verified correct patient position, special equipment/implants available, medications/allergies/relevant history reviewed, required imaging and test results available.   Sterile Technique Usual hand hygeine, masks, and gloves were used   Procedure Description Patient positioned in bed supine.  Sedation given as noted above.  Patient was intubated with endotracheal tube using Glidescope.  View was Grade 1 full glottis .  Number of attempts was 1.  Colorimetric CO2 detector was consistent with tracheal placement.   Complications/Tolerance None; patient tolerated the procedure well. Chest X-ray is ordered to verify placement.   EBL Minimal   Specimen(s) None  Melissa Garfinkel MD [facility] See Amion for pager  If no response to pager , please call [phone] until 7pm After 7:00 pm call Elink  [phone] 03/31/2022, 10:10 AM

## 2022-03-31 NOTE — Procedures (Signed)
Bronchoscopy Procedure Note  Melissa Leon  [id number]  [birth year]  Date:03/31/22  Time:10:07 AM   Provider Performing:Aymara Sassi   Procedure(s):  Flexible bronchoscopy with bronchial alveolar lavage [id number])  Indication(s) Acute resp failure, pneumonia  Consent Risks of the procedure as well as the alternatives and risks of each were explained to the patient and/or caregiver.  Consent for the procedure was obtained and is signed in the bedside chart  Anesthesia Etomidate  Time Out Verified patient identification, verified procedure, site/side was marked, verified correct patient position, special equipment/implants available, medications/allergies/relevant history reviewed, required imaging and test results available.  Sterile Technique Usual hand hygiene, masks, gowns, and gloves were used  Procedure Description Bronchoscope advanced through endotracheal tube and into airway.  Airways were examined down to subsegmental level with findings noted below.   Following diagnostic evaluation, BAL(s) performed in right middle lobe with normal saline and return of 100 cc fluid. The last aliquot was noted to be serosanguinous  Findings: Normal airways with no secretions or abnormalities. BAL performed in the right middle lobe.  Complications/Tolerance None; patient tolerated the procedure well. Chest X-ray is needed post procedure.  EBL Minimal  Specimen(s) BAL from right middle lobe.  [name] MD [facility] See Amion for pager  If no response to pager , please call [phone] until 7pm After 7:00 pm call Elink  [phone] 03/31/2022, 10:09 AM

## 2022-03-31 NOTE — Progress Notes (Signed)
PROGRESS NOTE  Melissa Leon  DOB: 04-Jul-1945  PCP: Katherina Mires, MD [id number]  DOA: 03/30/2022  LOS: 1 day  Hospital Day: 2  Brief narrative: Melissa Leon is a 77 y.o. female with PMH significant for DM2, HTN, chronic diastolic CHF, spinal stenosis who has had a complicated last 4 months with various cascade of of infections. 12/17 to 12/22, patient was hospitalized for severe sepsis secondary to E. 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  She underwent right PCN placement and was discharged on oral antibiotics.  She followed up with urology as an outpatient.  She needed multiple rounds of antibiotics for UTI/pyelonephritis.  Once infection cleared, she had a stent placed on 02/27/2022 2/10-2/16, patient was hospitalized for acute on chronic back pain.  Imaging showed epidural abscess at the level of L3/S1.  Blood culture grew MRSA.  2/12, she underwent left L3-L4 and L4-L5 hemilaminectomy and medial facetectomy with evacuation epidural abscess.  Echocardiac did not show evidence of endocarditis.  She was discharged on IV daptomycin with a tentative end date 04/12/2022. 2/29 to 3/6 for community-acquired pneumonia, UTI and was treated with cefepime, vancomycin azithromycin in the hospital.  She was discharged to continue IV daptomycin as planned previously for epidural abscess.  3/11, patient presented to the ED with complaint of generalized weakness, hypothermia cough and shortness of breath.  Patient reports symptoms started soon after discharge 5 days ago.  Family had noticed patient coughing a lot especially after eating meals.  In the ED, patient had a fever of 101.8, tachycardia of 104, O2 sat 84% on room air and required 4 L oxygen nasal cannula. Labs with WBC count 16.9, lactic acid 3.3 CT chest abdomen pelvis with contrast showed multifocal pneumonia with very extensive heterogeneous and consolidative airspace opacity throughout the lungs  with small bilateral pleural effusions. Blood culture sent. Per attending, EDP discussed with ID and was suggested to start on IV Rocephin and IV vancomycin. Admitted to [facility]  Subjective: Patient was seen and examined this morning.  Elderly Caucasian female.  In respiratory distress.  On 40 L oxygen.  Slow to respond but oriented x 3.  Daughter at bedside. Chart reviewed. Tachycardic to 90s and 100s last night.  Blood pressure stable. Overnight, patient's respiratory status worsened.  Earlier this morning, rapid response was called.  Patient needed to be placed on 40 L oxygen via high flow nasal cannula.  At the time of my evaluation earlier this morning, patient was propped up in bed.  Somnolent.  Opened eyes for conversation.  Daughter at bedside Patient is significantly tachypneic to 30s on 40 L oxygen.  I noted her to be at risk of respiratory arrest. She is full code and is agreeable to intubation if needed.  Assessment and plan: Acute respiratory failure with hypoxia Secondary to multifocal pneumonia Currently on 40 L of oxygen by heated high flow Seems to be an impending risk of respiratory arrest.  Full code.  Agreeable to intubation. I called critical care consult.  Patient probably would benefit from elective intubation.  Defer to critical care  Severe sepsis POA Very extensive bilateral multifocal pneumonia Presented with cough, shortness of breath, hypoxia, generalized weakness Noted to have fever, leukocytosis, elevated lactate CT chest as above showing very extensive multifocal pneumonia Currently on IV Rocephin and IV vancomycin Follow-up sputum culture, blood culture Continue Supportive care with sputum culture, Robitussin, flutter valve SLP evaluation given family's report of possible dysphagia  WBC count remains elevated.  Lactic acid level normalized. Recent Labs  Lab 03/25/22 0340 03/30/22 1218 03/30/22 1941 03/31/22 0208  WBC 14.4* 16.9*  --  17.9*   LATICACIDVEN  --  3.3* 1.1  --    Recent diagnosis of epidural abscess/MRSA bacteremia S/p left L3-L4 and L4-L5 hemilaminectomy and medial facetectomy with evacuation epidural abscess - 2/12 Currently on a 6 weeks course of daptomycin, EOT 04/12/2022 Continue the same. No focal neurological deficit PT evaluation once out of sepsis  Right hydronephrosis S/p R DJ stent - 02/27/2022 Follows up with urologist Dr. Abner Greenspan  Hypoglycemia H/o type 2 diabetes mellitus A1c 5.6 on December 2023 In the last 24 hours, noted low blood sugar episodes as well as 34 PTA on glimepiride 4 mg daily, metformin 1000 mg twice daily Keep oral meds on hold Because of hypoglycemia episodes, will switch the fluid to D5 NS at 75 mill per hour. Recent Labs  Lab 03/30/22 2217 03/30/22 2316 03/31/22 0108 03/31/22 0339 03/31/22 0723  GLUCAP 34* 78 50* 100* 88   Hyponatremia Likely hypovolemic Continue to monitor Recent Labs  Lab 03/25/22 0340 03/30/22 1218 03/31/22 0208  NA 135 131* 130*   CAD Noted in CT scan PTA on aspirin 81 mg daily  H/o hypertension PTA on amlodipine 5 mg daily, HCTZ 25 mg daily, losartan 100 mg daily Antihypertensives on hold while septic.    Mobility: Needs PT eval after acute illness is over  Goals of care   Code Status: Full Code  Discussed with patient and daughter at bedside   DVT prophylaxis:  enoxaparin (LOVENOX) injection 40 mg Start: 03/30/22 1730   Antimicrobials: IV vancomycin, IV Rocephin for now.  It seems IV daptomycin has not been resumed on admission.  Critical care team to talk to ID Fluid: D5 NS at 69 mill per hour Consultants: PCCM Family Communication: Daughter at bedside  Status: Inpatient Level of care:  ICU   Needs to continue in-hospital care:  Worsening sepsis,  Prognosis:  High risk of respiratory arrest  Patient from: Home Anticipated d/c to: Pending clinical course     Scheduled Meds:  aspirin  81 mg Oral QHS   dextrose  25  g Intravenous STAT   enoxaparin (LOVENOX) injection  40 mg Subcutaneous Q24H   guaiFENesin  1,200 mg Oral BID   insulin aspart  0-15 Units Subcutaneous TID WC    PRN meds: bisacodyl, hydrALAZINE, ipratropium-albuterol, ondansetron **OR** ondansetron (ZOFRAN) IV, senna-docusate   Infusions:   cefTRIAXone (ROCEPHIN)  IV     dextrose 5 % and 0.9% NaCl     vancomycin      Diet:  Diet Order             Diet NPO time specified Except for: Sips with Meds  Diet effective now                   Antimicrobials: Anti-infectives (From admission, onward)    Start     Dose/Rate Route Frequency Ordered Stop   03/31/22 1400  vancomycin (VANCOREADY) IVPB 1250 mg/250 mL        1,250 mg 166.7 mL/hr over 90 Minutes Intravenous Every 24 hours 03/30/22 1347     03/31/22 1000  cefTRIAXone (ROCEPHIN) 2 g in sodium chloride 0.9 % 100 mL IVPB        2 g 200 mL/hr over 30 Minutes Intravenous Every 24 hours 03/30/22 1730     03/30/22 1400  vancomycin (VANCOREADY) IVPB 1500 mg/300  mL        1,500 mg 150 mL/hr over 120 Minutes Intravenous  Once 03/30/22 1345 03/30/22 1946   03/30/22 1345  cefTRIAXone (ROCEPHIN) 2 g in sodium chloride 0.9 % 100 mL IVPB        2 g 200 mL/hr over 30 Minutes Intravenous  Once 03/30/22 1330 03/30/22 1456       Skin assessment:       Nutritional status:  There is no height or weight on file to calculate BMI.          Objective: Vitals:   03/31/22 0654 03/31/22 0726  BP:  136/65  Pulse:  99  Resp: (!) 23 19  Temp:  98.4 F (36.9 C)  SpO2: 98% 100%    Intake/Output Summary (Last 24 hours) at 03/31/2022 0850 Last data filed at 03/31/2022 [id number] Gross per 24 hour  Intake --  Output 600 ml  Net -600 ml   There were no vitals filed for this visit. Weight change:  There is no height or weight on file to calculate BMI.   Physical Exam: General exam: Pleasant elderly Caucasian female.  In respiratory distress Skin: No rashes, lesions or  ulcers. HEENT: Atraumatic, normocephalic, no obvious bleeding Lungs: Shallow and fast breathing. CVS: Tachycardic to 100s, no murmur GI/Abd soft, nontender, nondistended, bowel sound present CNS: Somnolent, opens eyes to verbal command.  Slow to respond but oriented x 3 Psychiatry: Sad affect Extremities: No pedal edema, no calf tenderness  Data Review: I have personally reviewed the laboratory data and studies available.  F/u labs ordered Unresulted Labs (From admission, onward)     Start     Ordered   04/01/22 [id number]  Basic metabolic panel  Daily,   R      03/31/22 0848   04/01/22 0500  CBC with Differential/Platelet  Daily,   R      03/31/22 0848            Total time spent in review of labs and imaging, patient evaluation, formulation of plan, documentation and communication with family: 89 minutes  Signed, Terrilee Croak, MD [facility] 03/31/2022

## 2022-04-01 DIAGNOSIS — G062 Extradural and subdural abscess, unspecified: Secondary | ICD-10-CM

## 2022-04-01 DIAGNOSIS — J8281 Chronic eosinophilic pneumonia: Secondary | ICD-10-CM | POA: Diagnosis not present

## 2022-04-01 DIAGNOSIS — E119 Type 2 diabetes mellitus without complications: Secondary | ICD-10-CM | POA: Diagnosis not present

## 2022-04-01 DIAGNOSIS — I5032 Chronic diastolic (congestive) heart failure: Secondary | ICD-10-CM | POA: Diagnosis not present

## 2022-04-01 DIAGNOSIS — J9601 Acute respiratory failure with hypoxia: Secondary | ICD-10-CM | POA: Diagnosis not present

## 2022-04-01 DIAGNOSIS — J189 Pneumonia, unspecified organism: Secondary | ICD-10-CM | POA: Diagnosis not present

## 2022-04-01 LAB — BASIC METABOLIC PANEL
Anion gap: 6 (ref 5–15)
BUN: 27 mg/dL — ABNORMAL HIGH (ref 8–23)
CO2: 21 mmol/L — ABNORMAL LOW (ref 22–32)
Calcium: 8.7 mg/dL — ABNORMAL LOW (ref 8.9–10.3)
Chloride: 105 mmol/L (ref 98–111)
Creatinine, Ser: 0.84 mg/dL (ref 0.44–1.00)
GFR, Estimated: >60 mL/min (ref >60)
Glucose, Bld: 203 mg/dL — ABNORMAL HIGH (ref 70–99)
Potassium: 4.5 mmol/L (ref 3.5–5.1)
Sodium: 132 mmol/L — ABNORMAL LOW (ref 135–145)

## 2022-04-01 LAB — PHOSPHORUS
Phosphorus: 4.4 mg/dL (ref 2.5–4.6)
Phosphorus: 3.7 mg/dL (ref 2.5–4.6)

## 2022-04-01 LAB — GLUCOSE, CAPILLARY
Glucose-Capillary: 215 mg/dL — ABNORMAL HIGH (ref 70–99)
Glucose-Capillary: 224 mg/dL — ABNORMAL HIGH (ref 70–99)
Glucose-Capillary: 236 mg/dL — ABNORMAL HIGH (ref 70–99)
Glucose-Capillary: 265 mg/dL — ABNORMAL HIGH (ref 70–99)
Glucose-Capillary: 269 mg/dL — ABNORMAL HIGH (ref 70–99)
Glucose-Capillary: 300 mg/dL — ABNORMAL HIGH (ref 70–99)

## 2022-04-01 LAB — CBC WITH DIFFERENTIAL/PLATELET
Abs Immature Granulocytes: 0.23 10*3/uL — ABNORMAL HIGH (ref 0.00–0.07)
Basophils Absolute: 0 10*3/uL (ref 0.0–0.1)
Basophils Relative: 0 %
Eosinophils Absolute: 0 10*3/uL (ref 0.0–0.5)
Eosinophils Relative: 0 %
HCT: 22.6 % — ABNORMAL LOW (ref 36.0–46.0)
Hemoglobin: 7.2 g/dL — ABNORMAL LOW (ref 12.0–15.0)
Immature Granulocytes: 1 %
Lymphocytes Relative: 2 %
Lymphs Abs: 0.3 10*3/uL — ABNORMAL LOW (ref 0.7–4.0)
MCH: 28.5 pg (ref 26.0–34.0)
MCHC: 31.9 g/dL (ref 30.0–36.0)
MCV: 89.3 fL (ref 80.0–100.0)
Monocytes Absolute: 0.2 10*3/uL (ref 0.1–1.0)
Monocytes Relative: 1 %
Neutro Abs: 16.6 10*3/uL — ABNORMAL HIGH (ref 1.7–7.7)
Neutrophils Relative %: 96 %
Platelets: 328 10*3/uL (ref 150–400)
RBC: 2.53 MIL/uL — ABNORMAL LOW (ref 3.87–5.11)
RDW: 15.8 % — ABNORMAL HIGH (ref 11.5–15.5)
WBC: 17.4 10*3/uL — ABNORMAL HIGH (ref 4.0–10.5)
nRBC: 0 % (ref 0.0–0.2)

## 2022-04-01 LAB — IRON AND TIBC
Iron: 19 ug/dL — ABNORMAL LOW (ref 28–170)
Saturation Ratios: 13 % (ref 10.4–31.8)
TIBC: 150 ug/dL — ABNORMAL LOW (ref 250–450)
UIBC: 131 ug/dL

## 2022-04-01 LAB — VITAMIN B12: Vitamin B-12: 254 pg/mL (ref 180–914)

## 2022-04-01 LAB — BRAIN NATRIURETIC PEPTIDE: B Natriuretic Peptide: 565.1 pg/mL — ABNORMAL HIGH (ref 0.0–100.0)

## 2022-04-01 LAB — FERRITIN: Ferritin: 317 ng/mL — ABNORMAL HIGH (ref 11–307)

## 2022-04-01 LAB — LACTATE DEHYDROGENASE: LDH: 229 U/L — ABNORMAL HIGH (ref 98–192)

## 2022-04-01 LAB — MAGNESIUM
Magnesium: 2.9 mg/dL — ABNORMAL HIGH (ref 1.7–2.4)
Magnesium: 2.3 mg/dL (ref 1.7–2.4)

## 2022-04-01 LAB — RETICULOCYTES
Immature Retic Fract: 14.5 % (ref 2.3–15.9)
RBC.: 2.49 MIL/uL — ABNORMAL LOW (ref 3.87–5.11)
Retic Count, Absolute: 54.6 10*3/uL (ref 19.0–186.0)
Retic Ct Pct: 2.1 % (ref 0.4–3.1)

## 2022-04-01 LAB — PROTIME-INR
INR: 1.4 — ABNORMAL HIGH (ref 0.8–1.2)
Prothrombin Time: 16.7 seconds — ABNORMAL HIGH (ref 11.4–15.2)

## 2022-04-01 LAB — C-REACTIVE PROTEIN: CRP: 21.5 mg/dL — ABNORMAL HIGH (ref <1.0)

## 2022-04-01 LAB — FOLATE: Folate: 7.8 ng/mL (ref >5.9)

## 2022-04-01 LAB — VANCOMYCIN, PEAK: Vancomycin Pk: 37 ug/mL (ref 30–40)

## 2022-04-01 MED ORDER — INSULIN GLARGINE-YFGN 100 UNIT/ML ~~LOC~~ SOLN
7.0000 [IU] | Freq: Every day | SUBCUTANEOUS | Status: DC
  Administered 2022-04-01 – 2022-04-02 (×2): 7 [IU] via SUBCUTANEOUS
  Filled 2022-04-01 (×2): qty 0.07

## 2022-04-01 MED ORDER — GERHARDT'S BUTT CREAM
TOPICAL_CREAM | Freq: Two times a day (BID) | CUTANEOUS | Status: DC
  Filled 2022-04-01 (×2): qty 1

## 2022-04-01 MED ORDER — ALBUMIN HUMAN 25 % IV SOLN: 25.0000 g | Freq: Once | INTRAVENOUS | Status: AC

## 2022-04-01 MED ORDER — NOREPINEPHRINE 4 MG/250ML-% IV SOLN
0.0000 ug/min | INTRAVENOUS | Status: DC
  Administered 2022-04-01: 2 ug/min via INTRAVENOUS
  Filled 2022-04-01: qty 250

## 2022-04-01 MED ORDER — GUAIFENESIN 200 MG PO TABS
400.0000 mg | ORAL_TABLET | Freq: Four times a day (QID) | ORAL | Status: DC
  Administered 2022-04-01 – 2022-04-06 (×21): 400 mg
  Filled 2022-04-01 (×21): qty 2

## 2022-04-01 MED ORDER — INSULIN ASPART 100 UNIT/ML IJ SOLN
4.0000 [IU] | INTRAMUSCULAR | Status: DC
  Administered 2022-04-01 – 2022-04-02 (×6): 4 [IU] via SUBCUTANEOUS

## 2022-04-01 MED ORDER — DEXMEDETOMIDINE HCL IN NACL 400 MCG/100ML IV SOLN
0.0000 ug/kg/h | INTRAVENOUS | Status: DC
  Administered 2022-04-01 – 2022-04-02 (×3): 0.4 ug/kg/h via INTRAVENOUS
  Administered 2022-04-02: 0.6 ug/kg/h via INTRAVENOUS
  Administered 2022-04-02: 0.8 ug/kg/h via INTRAVENOUS
  Administered 2022-04-03: 0.9 ug/kg/h via INTRAVENOUS
  Administered 2022-04-03: 0.8 ug/kg/h via INTRAVENOUS
  Administered 2022-04-03 – 2022-04-06 (×13): 1.2 ug/kg/h via INTRAVENOUS
  Filled 2022-04-01 (×12): qty 100
  Filled 2022-04-01: qty 200
  Filled 2022-04-01 (×4): qty 100
  Filled 2022-04-01: qty 200
  Filled 2022-04-01 (×2): qty 100

## 2022-04-01 MED ORDER — METHYLPREDNISOLONE SODIUM SUCC 40 MG IJ SOLR
40.0000 mg | Freq: Two times a day (BID) | INTRAMUSCULAR | Status: DC
  Administered 2022-04-01 – 2022-04-04 (×6): 40 mg via INTRAVENOUS
  Filled 2022-04-01 (×7): qty 1

## 2022-04-01 MED ORDER — ALBUMIN HUMAN 25 % IV SOLN
INTRAVENOUS | Status: AC
  Administered 2022-04-01: 25 g via INTRAVENOUS
  Filled 2022-04-01: qty 100

## 2022-04-01 MED ORDER — INSULIN GLARGINE-YFGN 100 UNIT/ML ~~LOC~~ SOLN
7.0000 [IU] | Freq: Every day | SUBCUTANEOUS | Status: DC
  Filled 2022-04-01: qty 0.07

## 2022-04-01 MED ORDER — HYDROCORTISONE SOD SUC (PF) 100 MG IJ SOLR
100.0000 mg | Freq: Two times a day (BID) | INTRAMUSCULAR | Status: DC
  Administered 2022-04-01: 100 mg via INTRAVENOUS
  Filled 2022-04-01: qty 2

## 2022-04-01 NOTE — Progress Notes (Signed)
[facility] Progress Note Patient Name: Melissa Leon DOB: 29-Nov-1945 MRN: [id number]   Date of Service  04/01/2022  HPI/Events of Note  BP 83/50, MAP 61, HR 67, serum albumin 2.4, CVP 5 cm, patient has been oliguric. Patient has bilateral  lung infiltrates.  eICU Interventions  BNP ordered  to further assess volume status, in the interim will give 25 gm of 25 % albumin and also start low dose Norepinephrine gtt targeting a MAP > 65, if BNP supports volume depletion will order additional fluids.        Tevis Conger U [place] Doss 04/01/2022, 1:12 AM

## 2022-04-01 NOTE — Progress Notes (Signed)
PT Cancellation Note  Patient Details Name: Melissa Leon MRN: [id number] DOB: Mar 09, 1945   Cancelled Treatment:    Reason Eval/Treat Not Completed: Patient not medically ready - pt on vent, significant hypotension overnight. PT to hold today, will check back tomorrow per RN request.   Stacie Glaze, PT DPT Acute Rehabilitation Services Pager [phone]  Office [phone]    [place] 04/01/2022, 7:56 AM

## 2022-04-01 NOTE — Progress Notes (Signed)
Subjective: No new complaints   Antibiotics:  Anti-infectives (From admission, onward)    Start     Dose/Rate Route Frequency Ordered Stop   03/31/22 1400  vancomycin (VANCOREADY) IVPB 1250 mg/250 mL        1,250 mg 166.7 mL/hr over 90 Minutes Intravenous Every 24 hours 03/30/22 1347     03/31/22 1000  cefTRIAXone (ROCEPHIN) 2 g in sodium chloride 0.9 % 100 mL IVPB        2 g 200 mL/hr over 30 Minutes Intravenous Every 24 hours 03/30/22 1730     03/30/22 1400  vancomycin (VANCOREADY) IVPB 1500 mg/300 mL        1,500 mg 150 mL/hr over 120 Minutes Intravenous  Once 03/30/22 1345 03/30/22 1946   03/30/22 1345  cefTRIAXone (ROCEPHIN) 2 g in sodium chloride 0.9 % 100 mL IVPB        2 g 200 mL/hr over 30 Minutes Intravenous  Once 03/30/22 1330 03/30/22 1456       Medications: Scheduled Meds:  aspirin  81 mg Oral QHS   Chlorhexidine Gluconate Cloth  6 each Topical Daily   docusate  100 mg Per Tube BID   enoxaparin (LOVENOX) injection  40 mg Subcutaneous Q24H   feeding supplement (PROSource TF20)  60 mL Per Tube Daily   Gerhardt's butt cream   Topical BID   guaiFENesin  400 mg Per Tube Q6H   insulin aspart  0-15 Units Subcutaneous Q4H   insulin aspart  4 Units Subcutaneous Q4H   insulin glargine-yfgn  7 Units Subcutaneous Daily   methylPREDNISolone (SOLU-MEDROL) injection  40 mg Intravenous Q12H   mouth rinse  15 mL Mouth Rinse Q2H   pantoprazole (PROTONIX) IV  40 mg Intravenous QHS   polyethylene glycol  17 g Per Tube Daily   sodium chloride flush  10-40 mL Intracatheter Q12H   Continuous Infusions:  sodium chloride 10 mL/hr at 04/01/22 1500   cefTRIAXone (ROCEPHIN)  IV Stopped (04/01/22 1037)   dexmedetomidine (PRECEDEX) IV infusion Stopped (04/01/22 1305)   feeding supplement (VITAL 1.5 CAL) 40 mL/hr at 04/01/22 1500   fentaNYL infusion INTRAVENOUS Stopped (04/01/22 1305)   norepinephrine (LEVOPHED) Adult infusion Stopped (04/01/22 1021)   vancomycin 166.7  mL/hr at 04/01/22 1500   PRN Meds:.sodium chloride, bisacodyl, fentaNYL, hydrALAZINE, ipratropium-albuterol, midazolam, ondansetron **OR** ondansetron (ZOFRAN) IV, mouth rinse, senna-docusate, sodium chloride flush    Objective: Weight change:   Intake/Output Summary (Last 24 hours) at 04/01/2022 1552 Last data filed at 04/01/2022 1500 Gross per 24 hour  Intake 2881.7 ml  Output 1205 ml  Net 1676.7 ml   Blood pressure (!) 131/59, pulse 72, temperature (!) 97.4 F (36.3 C), temperature source Axillary, resp. rate (!) 21, height '5\' 7"'$  (1.702 m), weight 76.5 kg, SpO2 99 %. Temp:  [96.1 F (35.6 C)-97.6 F (36.4 C)] 97.4 F (36.3 C) (03/13 1535) Pulse Rate:  [62-99] 72 (03/13 1530) Resp:  [10-28] 21 (03/13 1530) BP: (78-139)/(46-78) 131/59 (03/13 1530) SpO2:  [92 %-100 %] 99 % (03/13 1530) FiO2 (%):  [40 %-60 %] 40 % (03/13 1500) Weight:  [76.5 kg] 76.5 kg (03/13 0330)  Physical Exam: Physical Exam Constitutional:      Interventions: She is intubated.  HENT:     Head: Normocephalic and atraumatic.  Eyes:     Extraocular Movements: Extraocular movements intact.     Conjunctiva/sclera: Conjunctivae normal.  Cardiovascular:     Rate and Rhythm: Tachycardia present.  Pulmonary:  Effort: She is intubated.  Abdominal:     General: There is no distension.  Skin:    General: Skin is warm and dry.  Neurological:     General: No focal deficit present.     Mental Status: She is alert.      CBC:    BMET Recent Labs    03/31/22 0208 03/31/22 1113 04/01/22 0129  NA 130* 133* 132*  K 3.3* 3.6 4.5  CL 99  --  105  CO2 21*  --  21*  GLUCOSE 135*  --  203*  BUN 19  --  27*  CREATININE 0.82  --  0.84  CALCIUM 8.3*  --  8.7*     Liver Panel  Recent Labs    03/30/22 1218  PROT 7.1  ALBUMIN 2.4*  AST 53*  ALT 55*  ALKPHOS 117  BILITOT 1.0       Sedimentation Rate No results for input(s): "ESRSEDRATE" in the last 72 hours. C-Reactive Protein Recent  Labs    04/01/22 1000  CRP 21.5*    Micro Results: Recent Results (from the past 720 hour(s))  Blood culture (routine x 2)     Status: None   Collection Time: 03/19/22  7:35 PM   Specimen: BLOOD RIGHT HAND  Result Value Ref Range Status   Specimen Description BLOOD RIGHT HAND  Final   Special Requests   Final    BOTTLES DRAWN AEROBIC ONLY Blood Culture adequate volume   Culture   Final    NO GROWTH 5 DAYS Performed at [facility], [address] [place], [place] [address]    Report Status 03/24/2022 FINAL  Final  Blood culture (routine x 2)     Status: None   Collection Time: 03/19/22  7:48 PM   Specimen: BLOOD RIGHT ARM  Result Value Ref Range Status   Specimen Description BLOOD RIGHT ARM  Final   Special Requests   Final    BOTTLES DRAWN AEROBIC AND ANAEROBIC Blood Culture adequate volume   Culture   Final    NO GROWTH 5 DAYS Performed at [facility], [place] [address], [place], [place] [address]    Report Status 03/24/2022 FINAL  Final  Resp panel by RT-PCR (RSV, Flu A&B, Covid) Anterior Nasal Swab     Status: None   Collection Time: 03/19/22 10:25 PM   Specimen: Anterior Nasal Swab  Result Value Ref Range Status   SARS Coronavirus 2 by RT PCR NEGATIVE NEGATIVE Final   Influenza A by PCR NEGATIVE NEGATIVE Final   Influenza B by PCR NEGATIVE NEGATIVE Final    Comment: (NOTE) The Xpert Xpress SARS-CoV-2/FLU/RSV plus assay is intended as an aid in the diagnosis of influenza from Nasopharyngeal swab specimens and should not be used as a sole basis for treatment. Nasal washings and aspirates are unacceptable for Xpert Xpress SARS-CoV-2/FLU/RSV testing.  Fact Sheet for Patients: EntrepreneurPulse.com.au  Fact Sheet for Healthcare Providers: IncredibleEmployment.be  This test is not yet approved or cleared by the [place] FDA and has been authorized for detection and/or diagnosis of SARS-CoV-2 by FDA under an  Emergency Use Authorization (EUA). This EUA will remain in effect (meaning this test can be used) for the duration of the COVID-19 declaration under Section 564(b)(1) of the Act, 21 U.S.C. section 360bbb-3(b)(1), unless the authorization is terminated or revoked.     Resp Syncytial Virus by PCR NEGATIVE NEGATIVE Final    Comment: (NOTE) Fact Sheet for Patients: EntrepreneurPulse.com.au  Fact Sheet for Healthcare Providers: IncredibleEmployment.be  This test is not yet approved or cleared by the [place] FDA and has been authorized for detection and/or diagnosis of SARS-CoV-2 by FDA under an Emergency Use Authorization (EUA). This EUA will remain in effect (meaning this test can be used) for the duration of the COVID-19 declaration under Section 564(b)(1) of the Act, 21 U.S.C. section 360bbb-3(b)(1), unless the authorization is terminated or revoked.  Performed at [facility], [place] [address] [place], [place] [address]   Urine Culture     Status: Abnormal   Collection Time: 03/20/22 12:45 AM   Specimen: Urine, Random  Result Value Ref Range Status   Specimen Description URINE, RANDOM  Final   Special Requests   Final    NONE Reflexed from [id number] Performed at [facility], [place] [address], [place], [place] [address]    Culture 10,000 COLONIES/mL YEAST (A)  Final   Report Status 03/21/2022 FINAL  Final  MRSA Next Gen by PCR, Nasal     Status: None   Collection Time: 03/20/22  8:15 AM   Specimen: Nasal Mucosa; Nasal Swab  Result Value Ref Range Status   MRSA by PCR Next Gen NOT DETECTED NOT DETECTED Final    Comment: (NOTE) The GeneXpert MRSA Assay (FDA approved for NASAL specimens only), is one component of a comprehensive MRSA colonization surveillance program. It is not intended to diagnose MRSA infection nor to guide or monitor treatment for MRSA infections. Test performance is not FDA approved in patients less than 33  years old. Performed at [facility], [place] [address], [place], [place] [address]   Respiratory (~20 pathogens) panel by PCR     Status: None   Collection Time: 03/20/22  8:16 AM   Specimen: Nasopharyngeal Swab; Respiratory  Result Value Ref Range Status   Adenovirus NOT DETECTED NOT DETECTED Final   Coronavirus 229E NOT DETECTED NOT DETECTED Final    Comment: (NOTE) The Coronavirus on the Respiratory Panel, DOES NOT test for the novel  Coronavirus (2019 nCoV)    Coronavirus HKU1 NOT DETECTED NOT DETECTED Final   Coronavirus NL63 NOT DETECTED NOT DETECTED Final   Coronavirus OC43 NOT DETECTED NOT DETECTED Final   Metapneumovirus NOT DETECTED NOT DETECTED Final   Rhinovirus / Enterovirus NOT DETECTED NOT DETECTED Final   Influenza A NOT DETECTED NOT DETECTED Final   Influenza B NOT DETECTED NOT DETECTED Final   Parainfluenza Virus 1 NOT DETECTED NOT DETECTED Final   Parainfluenza Virus 2 NOT DETECTED NOT DETECTED Final   Parainfluenza Virus 3 NOT DETECTED NOT DETECTED Final   Parainfluenza Virus 4 NOT DETECTED NOT DETECTED Final   Respiratory Syncytial Virus NOT DETECTED NOT DETECTED Final   Bordetella pertussis NOT DETECTED NOT DETECTED Final   Bordetella Parapertussis NOT DETECTED NOT DETECTED Final   Chlamydophila pneumoniae NOT DETECTED NOT DETECTED Final   Mycoplasma pneumoniae NOT DETECTED NOT DETECTED Final    Comment: Performed at [facility], [place]. [address], [place], [place] [address]  Culture, blood (routine x 2)     Status: None (Preliminary result)   Collection Time: 03/30/22 12:18 PM   Specimen: BLOOD  Result Value Ref Range Status   Specimen Description BLOOD LEFT ANTECUBITAL  Final   Special Requests   Final    BOTTLES DRAWN AEROBIC AND ANAEROBIC Blood Culture adequate volume   Culture   Final    NO GROWTH 2 DAYS Performed at [facility], [address], [place], [place] [address]    Report Status PENDING  Incomplete  Culture, blood  (routine x 2)     Status: None (Preliminary result)   Collection Time: 03/30/22  7:42 PM   Specimen: BLOOD  Result Value Ref Range Status   Specimen Description BLOOD LEFT ANTECUBITAL  Final   Special Requests   Final    BOTTLES DRAWN AEROBIC AND ANAEROBIC Blood Culture results may not be optimal due to an inadequate volume of blood received in culture bottles   Culture   Final    NO GROWTH 2 DAYS Performed at [facility], [place] [address], [place], [place] [address]    Report Status PENDING  Incomplete  Culture, Respiratory w Gram Stain     Status: None (Preliminary result)   Collection Time: 03/31/22 10:13 AM   Specimen: Bronchoalveolar Lavage; Respiratory  Result Value Ref Range Status   Specimen Description BRONCHIAL ALVEOLAR LAVAGE  Final   Special Requests NONE  Final   Gram Stain   Final    FEW WBC PRESENT, PREDOMINANTLY PMN NO ORGANISMS SEEN    Culture   Final    NO GROWTH < 24 HOURS Performed at [facility], [address], [place], [place] [address]    Report Status PENDING  Incomplete  Respiratory (~20 pathogens) panel by PCR     Status: None   Collection Time: 03/31/22 10:13 AM   Specimen: Bronchoalveolar Lavage; Respiratory  Result Value Ref Range Status   Adenovirus NOT DETECTED NOT DETECTED Final   Coronavirus 229E NOT DETECTED NOT DETECTED Final    Comment: (NOTE) The Coronavirus on the Respiratory Panel, DOES NOT test for the novel  Coronavirus (2019 nCoV)    Coronavirus HKU1 NOT DETECTED NOT DETECTED Final   Coronavirus NL63 NOT DETECTED NOT DETECTED Final   Coronavirus OC43 NOT DETECTED NOT DETECTED Final   Metapneumovirus NOT DETECTED NOT DETECTED Final   Rhinovirus / Enterovirus NOT DETECTED NOT DETECTED Final   Influenza A NOT DETECTED NOT DETECTED Final   Influenza B NOT DETECTED NOT DETECTED Final   Parainfluenza Virus 1 NOT DETECTED NOT DETECTED Final   Parainfluenza Virus 2 NOT DETECTED NOT DETECTED Final   Parainfluenza Virus 3 NOT  DETECTED NOT DETECTED Final   Parainfluenza Virus 4 NOT DETECTED NOT DETECTED Final   Respiratory Syncytial Virus NOT DETECTED NOT DETECTED Final   Bordetella pertussis NOT DETECTED NOT DETECTED Final   Bordetella Parapertussis NOT DETECTED NOT DETECTED Final   Chlamydophila pneumoniae NOT DETECTED NOT DETECTED Final   Mycoplasma pneumoniae NOT DETECTED NOT DETECTED Final    Comment: Performed at [facility], [place]. [address], [place], [place] [address]  MRSA Next Gen by PCR, Nasal     Status: None   Collection Time: 03/31/22 11:30 AM   Specimen: Nasal Mucosa; Nasal Swab  Result Value Ref Range Status   MRSA by PCR Next Gen NOT DETECTED NOT DETECTED Final    Comment: (NOTE) The GeneXpert MRSA Assay (FDA approved for NASAL specimens only), is one component of a comprehensive MRSA colonization surveillance program. It is not intended to diagnose MRSA infection nor to guide or monitor treatment for MRSA infections. Test performance is not FDA approved in patients less than 55 years old. Performed at [facility], [place] [address], [place], [place] [address]     Studies/Results: MR Lumbar Spine W Wo Contrast  Result Date: 04/01/2022 CLINICAL DATA:  Low back pain  EXAM: MRI LUMBAR SPINE WITHOUT AND WITH CONTRAST TECHNIQUE: Multiplanar and multiecho pulse sequences of the lumbar spine were obtained without and with intravenous contrast. CONTRAST:  28m GADAVIST GADOBUTROL 1 MMOL/ML IV SOLN COMPARISON:  02/28/2022 FINDINGS: Segmentation: Transitional lumbosacral anatomy with partial sacralization of L5. Numbering unchanged from prior study. Alignment:  Physiologic. Vertebrae: New superior endplate edema at L3. Status post left laminectomies at L3 and L4. Conus medullaris and cauda equina: Conus extends to the L1 level. Conus and cauda equina appear normal. Previously seen epidural collection has resolved. Paraspinal and other soft tissues: Small amount of dorsal paraspinal fluid at the L3  and L4 levels. Fatty atrophy of the paraspinous muscles. No psoas abscess. Right hydronephrosis. Disc levels: L1-L2: Normal disc space and facet joints. No spinal canal stenosis. No neural foraminal stenosis. L2-L3: Unchanged small disc bulge with mild facet hypertrophy. Mild spinal canal stenosis. No neural foraminal stenosis. L3-L4: Status post left laminectomy. Small disc bulge with severe facet arthrosis. Improved patency of the spinal canal with moderate residual spinal canal stenosis. Mild bilateral neural foraminal stenosis. L4-L5: Left laminectomy with improved patency of the spinal canal. Mild spinal canal stenosis. Moderate facet arthrosis. Moderate right neural foraminal stenosis. L5-S1: Transitional level. No spinal canal stenosis. No neural foraminal stenosis. Visualized sacrum: Normal. IMPRESSION: 1. Previously seen epidural collection has resolved. 2. Status post left laminectomies at L3 and L4 with improved patency of the spinal canal at both levels. Moderate residual L3-4 and mild L4-5 spinal canal stenosis. 3. New superior endplate edema at L3 may be a source of local low back pain. No abnormal signal within the disc space to indicate discitis. 4. Moderate right L4-5 neural foraminal stenosis. Electronically Signed   By: KUlyses JarredM.D.   On: 04/01/2022 00:45   DG CHEST PORT 1 VIEW  Result Date: 03/31/2022 CLINICAL DATA:  Intubation EXAM: PORTABLE CHEST 1 VIEW COMPARISON:  Portable exam 1052 hours compared to 03/30/2022 FINDINGS: Tip of endotracheal tube projects 3.0 cm above carina. Nasogastric tube extends into stomach. Normal heart size and mediastinal contours. Atherosclerotic calcification aorta. Patchy airspace infiltrates bilaterally, slightly greater on LEFT, increased from previous exam, most consistent with multifocal pneumonia. Trace pleural effusions bilaterally. No pneumothorax or acute osseous findings. IMPRESSION: Patchy BILATERAL airspace infiltrates slightly increased from  previous exam and asymmetrically greater on LEFT, favoring multifocal pneumonia. Trace bibasilar pleural effusions. Electronically Signed   By: MLavonia DanaM.D.   On: 03/31/2022 11:06   CT CHEST ABDOMEN PELVIS W CONTRAST  Result Date: 03/30/2022 CLINICAL DATA:  Pneumonia, fatigue, recent hospitalization and surgeries including nephrostomy and surgical procedure for spinal abscess EXAM: CT CHEST, ABDOMEN, AND PELVIS WITH CONTRAST TECHNIQUE: Multidetector CT imaging of the chest, abdomen and pelvis was performed following the standard protocol during bolus administration of intravenous contrast. RADIATION DOSE REDUCTION: This exam was performed according to the departmental dose-optimization program which includes automated exposure control, adjustment of the mA and/or kV according to patient size and/or use of iterative reconstruction technique. CONTRAST:  78mOMNIPAQUE IOHEXOL 350 MG/ML SOLN COMPARISON:  CT abdomen pelvis, 03/19/2022, CT chest, 02/24/2022 FINDINGS: CT CHEST FINDINGS Cardiovascular: Aortic atherosclerosis. Normal heart size. Three-vessel coronary artery calcifications. No pericardial effusion. Mediastinum/Nodes: Prominent mediastinal lymph nodes, likely reactive. Thyroid gland, trachea, and esophagus demonstrate no significant findings. Lungs/Pleura: Very extensive heterogeneous and consolidative airspace opacity throughout the lungs with small bilateral pleural effusions. Musculoskeletal: No chest wall abnormality. No acute osseous findings. CT ABDOMEN PELVIS FINDINGS Hepatobiliary: No focal liver abnormality is seen.  Status post cholecystectomy. Unchanged postoperative biliary dilatation. Pancreas: Unremarkable. No pancreatic ductal dilatation or surrounding inflammatory changes. Spleen: Normal in size without significant abnormality. Adrenals/Urinary Tract: Adrenal glands are unremarkable. Similar right hydronephrosis with a right-sided double-J ureteral stent catheter, formed pigtails in the  right renal pelvis and urinary bladder. Left kidney is normal, without renal calculi, solid lesion, or hydronephrosis. Bladder is unremarkable. Stomach/Bowel: Stomach is within normal limits. Diverticulum of the descending portion of the duodenum. Appendix appears normal. No evidence of bowel wall thickening, distention, or inflammatory changes. Sigmoid diverticula. Large burden of stool throughout the colon. Vascular/Lymphatic: Aortic atherosclerosis. No enlarged abdominal or pelvic lymph nodes. Reproductive: No mass or other abnormality. Other: No abdominal wall hernia or abnormality. No ascites. Musculoskeletal: No acute osseous findings. 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 IMPRESSION: 1. Very extensive heterogeneous and consolidative airspace opacity throughout the lungs with small bilateral pleural effusions. Findings are consistent with multifocal infection. 2. Similar right hydronephrosis with a right-sided double-J ureteral stent catheter, formed pigtails in the right renal pelvis and urinary bladder. 3. Coronary artery disease. Aortic Atherosclerosis (ICD10-I70.0). Electronically Signed   By: Delanna Ahmadi M.D.   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  #1 Eosinophilic pneumonia:  Improved corticosteroids and cessation of daptomycin I have added this to her allergies.  She is currently also on ceftriaxone and vancomycin to cover for bacterial culprits though I doubt them in the lungs  Follow-up on cultures from BAL.  #2 vertebral osteomyelitis discitis epidural abscess: MRI of the spine is encouraging.  There are some endplate edema L3  She was on course to finish 6 weeks of parenteral therapy.  Currently she is covered with vancomycin  Think as she improves and hopefully comes off the ventilator and out of the ICU we can look at a plan of transitioning her to oral antibiotics to complete treatment.  #3 Hypotension: appears related to her sedation upon d/w RN staff  I spent 52 minutes with the patient including than 50% of the time in face to face and non face time counseling of the patient reviewing MRI L spine, along with review of medical records in preparation for the visit and during the visit and in coordination of her care.    LOS: 2 days   Alcide Evener 04/01/2022, 3:52 PM

## 2022-04-01 NOTE — Consult Note (Signed)
[facility] Nurse Consult Note: Reason for Consult:Patient with partial thickness moisture associated skin damage to upper gluteal area, sacrum.  More extensive to left upper buttocks.  Peeling epithelium to coccyx Wound type:moisture associated skin damage Pressure Injury POA: NA Measurement: 6 cm x 4 cm intact erythema with 1 cm x 1 cm x 0.1 cm  peeling epithelium to coccyx.  Wound [id number] and moist Drainage (amount, consistency, odor) scant weeping Periwound: erythema  foley in place Dressing procedure/placement/frequency: Cleanse buttock and sacral breakdown with soap and water and pat dry. Apply gerhardts paste twice daily and PRN soilage.  Will not follow at this time.  Please re-consult if needed.  Estrellita Ludwig MSN, RN, FNP-BC CWON Wound, Ostomy, Continence Nurse [facility] [phone] Pager [phone]

## 2022-04-01 NOTE — Progress Notes (Addendum)
On 1200 assessment patient found to be more somnolent and less responsive. This RN titrating sedating medications. When the patient was turned to her side for application of cream the patient was able to wake up, move arms and hold onto the bed rail. No focal deficits at this time, will notify provider and continue to assess/titrate sedation.   1400 - patient more arousable with sedation turned completely off at this time. Patient able to follow commands but takes stimulation to be awake. Will continue with sedation off at this time and monitor for the need to restart. CCM aware

## 2022-04-01 NOTE — Progress Notes (Signed)
NAME:  Melissa Leon, MRN:  [id number], DOB:  01-08-1946, LOS: 2 ADMISSION DATE:  03/30/2022, CONSULTATION DATE:  03/31/2022 REFERRING MD:  Dr. 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   Pt initially was seen in 12/2021 for E. Coli UTI with severe hydronephrosis on the right secondary to ureteral obstruction requiring percutaneous nephrostomy tube placement. She underwent ureteral stent placement in 02/2022 after multiple rounds of antibiotics. Later in 02/2022 she was found to have a lumbar epidural abscess which was operated on and grew MRSA. TTE at that time without vegetations noted. She was discharged with a RUE PICC line on IV daptomycin started 03/06/2022 with planned 8 weeks of therapy to end on 04/12/2022. Following discharge she was admitted again with pneumonia on 03/19/2022. Daptomycin was held during that admission and restarted on discharge 03/25/2022. She was again admitted on 03/30/2022 with generalized weakness, hypothermia, cough, and dyspnea. She was found to have worsening infiltrates bilaterally on CXR and CT. With concerns for daptomycin induced eosinophilic pneumonia she was switched to ceftriaxone and vancomycin.   She has had progressive respiratory decline and is now on maximal support with HFNC 40L at 100% FiO2. She continues to have increased work of breathing, tachypnea, and diaphragmatic paradoxus while maintaining O2 sats of 96. Blood cultures are preliminarily negative. She is having worsening back pain similar to her prior lumbar abscess. She does not have other acute complaints.   Pertinent  Medical History  T2DM HTN HFpEF Spinal Stenosis Chronic IV antibiotic therapy with  daptomycin after MRSA+ lumbar spinal abscess R Ureteral obstruction with resultant hydronephrosis and pyelonephritis s/p JJ stent  Significant Hospital Events: Including procedures, antibiotic start and stop dates in addition to other pertinent events   3/11 admitted with sepsis and worsening multifocal pneumonia on chronic daptomycin, changed to ceftriaxone and vancomycin 3/12 transfer to MICU for intubation 2/2 progressive respiratory distress on HFNC 40L 100% FiO2  Interim History / Subjective:  Emergently intubated yesterday. Bronch done. Vent settings minimal this morning. Awake interacting and weaning. C/o some ETT related discomfort.   Objective   Blood pressure (!) 119/58, pulse 81, temperature 97.6 F (36.4 C), temperature source Axillary, resp. rate (!) 23, height '5\' 7"'$  (1.702 m), weight 76.5 kg, SpO2 97 %. CVP:  [5 mmHg] 5 mmHg  Vent Mode: PRVC FiO2 (%):  [40 %-100 %] 40 % Set Rate:  [20 bmp-24 bmp] 24 bmp Vt Set:  [490 mL] 490 mL PEEP:  [5 cmH20-8 cmH20] 8 cmH20 Plateau Pressure:  [23 cmH20-27 cmH20] 23 cmH20   Intake/Output Summary (Last 24 hours) at 04/01/2022 0848 Last data filed at 04/01/2022 0800 Gross per 24 hour  Intake 3932.84 ml  Output 1045 ml  Net 2887.84 ml    Filed Weights   03/31/22 1045 04/01/22 0330  Weight: 76 kg 76.5 kg    Examination:  General: elderly female resting comfortably in bed.  HENT: [place]/AT, PERRL, no JVD Lungs: Coarse crackles throughout Cardiovascular: REgular rate and rhythm. No edema.  Abdomen: soft, NT, ND Extremities: no acute deformity or ROM limitation.  Neuro: RASS 0  MRI lumbar spine: previously seen collection is resolved. POst surgical changes. New superior endplate edema L3. Mod L4-5 foraminal stenosis.   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  Lower than I would expect for true eosinophilic process.  RVP negative -Follow for BAL labs (cell count, culture, cytology, AFB, fungal culture, pneumocystis)  -ID following, continue vanc and ctx  - Hydrocortisone '100mg'$  q 12 hours to treat possible eosinophilic process and severe CAP.  -precedex/fentanyl gtt. RASS goal -1 to -2.  -Tolerating SBT, but still requiring 50% O2 to keep sats > 90. Not considered for extubation today. Hopefully tomorrow.   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 Asymptomatic  -Supportive care.   HFpEF CAD HTN -stable, will monitor fluid status -strict ins and outs -holding antihypertensives with low pressures -aspirin 81 mg daily -PRN hydralazine for SPB>160  T2DM -Semglee 7 units - 4 units TF coverage - SSI  Anemia: 3 gram drop over the past 24 hours. Baseline back in feb was 12, more recently she has been in the 8-9 range. Today 7.2. - No obvious bleeding source - Check occult blood - send haptoglobin, INR, LDH.  - anemia panel  Best Practice (right click and "Reselect all SmartList Selections" daily)   Diet/type: NPO TF DVT prophylaxis: LMWH, SCD GI prophylaxis: N/A Lines: N/A Foley:  N/A Code Status:  full code Last date of multidisciplinary goals of care discussion [3/12]  Labs   CBC: Recent Labs  Lab 03/30/22 1218 03/31/22 0208 03/31/22 1113 04/01/22 0129  WBC 16.9* 17.9*  --  17.4*  NEUTROABS 15.5*  --   --  16.6*  HGB 10.4* 8.6* 8.8* 7.2*  HCT 32.0* 25.6* 26.0* 22.6*  MCV 88.4 86.2  --  89.3  PLT 498* 413*  --  328     Basic Metabolic Panel: Recent Labs  Lab 03/30/22 1218 03/31/22 0208 03/31/22 1113 03/31/22 1632  04/01/22 0129  NA 131* 130* 133*  --  132*  K 3.7 3.3* 3.6  --  4.5  CL 96* 99  --   --  105  CO2 19* 21*  --   --  21*  GLUCOSE 96 135*  --   --  203*  BUN 25* 19  --   --  27*  CREATININE 0.89 0.82  --   --  0.84  CALCIUM 9.5 8.3*  --   --  8.7*  MG  --   --   --  1.6* 2.9*  PHOS  --   --   --  4.1 4.4    GFR: Estimated Creatinine Clearance: 60.8 mL/min (by C-G formula based on SCr of 0.84 mg/dL). Recent Labs  Lab 03/30/22 1218 03/30/22 1941 03/31/22 0208 04/01/22 0129  WBC 16.9*  --  17.9* 17.4*  LATICACIDVEN 3.3* 1.1  --   --      Liver Function Tests: Recent Labs  Lab 03/30/22 1218  AST 53*  ALT 55*  ALKPHOS 117  BILITOT 1.0  PROT 7.1  ALBUMIN 2.4*    No results for input(s): "LIPASE", "AMYLASE" in the last 168 hours. No results for input(s): "AMMONIA" in the last 168 hours.  ABG    Component Value Date/Time   PHART 7.261 (L) 03/31/2022 1113   PCO2ART 49.2 (H) 03/31/2022 1113  PO2ART 224 (H) 03/31/2022 1113   HCO3 22.1 03/31/2022 1113   TCO2 24 03/31/2022 1113   ACIDBASEDEF 5.0 (H) 03/31/2022 1113   O2SAT 100 03/31/2022 1113     Coagulation Profile: No results for input(s): "INR", "PROTIME" in the last 168 hours.  Cardiac Enzymes: No results for input(s): "CKTOTAL", "CKMB", "CKMBINDEX", "TROPONINI" in the last 168 hours.  HbA1C: Hgb A1c MFr Bld  Date/Time Value Ref Range Status  01/04/2022 12:57 PM 5.6 4.8 - 5.6 % Final    Comment:    (NOTE)         Prediabetes: 5.7 - 6.4         Diabetes: >6.4         Glycemic control for adults with diabetes: <7.0     CBG: Recent Labs  Lab 03/31/22 1544 03/31/22 1923 03/31/22 2311 04/01/22 0318 04/01/22 0755  GLUCAP 131* 166* 201* 215* 300*     Review of Systems:   Dyspnea, back pain  Past Medical History:  She,  has a past medical history of Anemia, Arthritis, Depression, Diabetes mellitus ([place]), Hypertension, Pneumonia, and Spinal stenosis.   Surgical History:   Past Surgical  History:  Procedure Laterality Date   APPENDECTOMY     CHOLECYSTECTOMY     CYSTOSCOPY WITH RETROGRADE PYELOGRAM, URETEROSCOPY AND STENT PLACEMENT Right 02/27/2022   Procedure: TURBT, CYSTOSCOPY WITH RIGHT RETROGRADE PYELOGRAM, RIGHT URETEROSCOPY, BIOPSY AND STENT PLACEMENT;  Surgeon: Janith Lima, MD;  Location: [facility];  Service: Urology;  Laterality: Right;  30 MINUTES NEEDED FOR CASE   IR NEPHROSTOMY PLACEMENT RIGHT  01/05/2022   LUMBAR LAMINECTOMY FOR EPIDURAL ABSCESS N/A 03/01/2022   Procedure: LUMBAR LAMINECTOMY LUMBAR THREE-FOUR, LUMBAR FOUR- FIVE FOR EPIDURAL ABSCESS;  Surgeon: Eustace Moore, MD;  Location: [facility];  Service: Neurosurgery;  Laterality: N/A;   UTERINE FIBROID SURGERY     WRIST GANGLION EXCISION Right      Social History:   reports that she has quit smoking. Her smoking use included cigarettes. She has a 0.50 pack-year smoking history. She has never used smokeless tobacco. She reports that she does not currently use alcohol. She reports that she does not use drugs.   Family History:  Her family history is negative for Breast cancer.   Allergies Allergies  Allergen Reactions   Daptomycin Other (See Comments)    Concern for eosinophilic PNA   Choline Fenofibrate Other (See Comments)    ELEVATED LFT.    Penicillins Hives   Ozempic (0.25 Or 0.5 Mg-Dose) [Semaglutide(0.25 Or 0.'5mg'$ -Dos)] Nausea Only   Statins Other (See Comments)    Aches, pain     Home Medications  Prior to Admission medications   Medication Sig Start Date End Date Taking? Authorizing Provider  acetaminophen (TYLENOL) 650 MG CR tablet Take 1,300 mg by mouth daily as needed for pain.   Yes [provider]  amLODipine (NORVASC) 5 MG tablet Take 5 mg by mouth at bedtime. 03/27/21  Yes [provider]  aspirin 81 MG chewable tablet Chew 81 mg by mouth at bedtime.   Yes [provider]  daptomycin (CUBICIN) IVPB Inject 650 mg into the vein daily for 20 days. Indication:  MRSA  epidural abscess First Dose: Yes Last Day of Therapy:  04/12/22 Labs - Once weekly:  CBC/D, BMP, and CPK Labs - Every other week:  ESR and CRP Method of administration: IV Push Pull PICC line at the completion of IV antibiotics Method of administration may be changed at the discretion  of home infusion pharmacist based upon assessment of the patient and/or caregiver's ability to self-administer the medication ordered. 03/25/22 04/14/22 Yes Pokhrel, Corrie Mckusick, MD  flavoxATE (URISPAS) 100 MG tablet Take 1 tablet (100 mg total) by mouth 3 (three) times daily as needed for bladder spasms. 03/25/22  Yes Pokhrel, Laxman, MD  glimepiride (AMARYL) 4 MG tablet Take 4 mg by mouth daily. 01/28/22  Yes [provider]  hydrochlorothiazide (HYDRODIURIL) 25 MG tablet Take 25 mg by mouth daily. 01/15/22  Yes [provider]  losartan (COZAAR) 100 MG tablet Take 100 mg by mouth in the morning. 04/21/21  Yes [provider]  metFORMIN (GLUCOPHAGE-XR) 500 MG 24 hr tablet Take 1,000 mg by mouth 2 (two) times daily with a meal. 03/27/21  Yes [provider]     Critical care time: 51 minutes     Georgann Housekeeper, AGACNP-BC [place] for personal pager PCCM on call pager [phone] until 7pm. Please call Elink 7p-7a. [phone]  04/01/2022 10:14 AM

## 2022-04-01 NOTE — TOC Initial Note (Signed)
Transition of Care [facility]) - Initial/Assessment Note    Patient Details  Name: Melissa Leon MRN: [id number] Date of Birth: March 25, 1945  [facility]) CM/SW Contact:    Tom-Johnson, Renea Ee, RN Phone Number: 04/01/2022, 2:05 PM  Clinical Narrative:                  CM spoke with patient's daughter, Lenna Sciara at bedside. Patient is admitted for Eosinophilic Pneumonia, on IV abx. Currently intubated.  Patient is from home, has been in and out of the since December per [facility]. Has a cane, walker, shower seat at home.  Active with [facility] for PT/OT/ Aides and RN for IV abx and active with [facility] for providing home IV abx.  PCP is Briscoe, Jannifer Rodney, MD [facility] [address]. No TOC needs or recommendations noted at this time. CM will continue to follow as patient progresses with care for discharge needs.          Expected Discharge Plan: [place] Barriers to Discharge: Continued Medical Work up   Patient Goals and CMS Choice Patient states their goals for this hospitalization and ongoing recovery are:: To return home CMS Medicare.gov Compare Post Acute Care list provided to:: Patient Choice offered to / list presented to : Patient, Adult Children      Expected Discharge Plan and Services   Discharge Planning Services: CM Consult   Living arrangements for the past 2 months: [place] Arranged: PT, RN, OT, Nurse's Aide (Resumption of care) [place] Agency: [place] Date [facility] Agency Contacted: 04/01/22 Time HH Agency Contacted: 1150 Representative spoke with at [place]: Tommi Rumps  Prior Living Arrangements/Services Living arrangements for the past 2 months: [place] with:: Siblings (Lived alone but now brother stays with her after recent hospitalizations.) Patient language and need for interpreter reviewed:: Yes Do you feel safe going back to the place where  you live?: Yes      Need for Family Participation in Patient Care: Yes (Comment) Care giver support system in place?: Yes (comment)   Criminal Activity/Legal Involvement Pertinent to Current Situation/Hospitalization: No - Comment as needed  Activities of Daily Living Home Assistive Devices/Equipment: Cane (specify quad or straight), Wheelchair, Environmental consultant (specify type) ADL Screening (condition at time of admission) Patient's cognitive ability adequate to safely complete daily activities?: Yes Is the patient deaf or have difficulty hearing?: No Does the patient have difficulty seeing, even when wearing glasses/contacts?: No Does the patient have difficulty concentrating, remembering, or making decisions?: No Patient able to express need for assistance with ADLs?: Yes Does the patient have difficulty dressing or bathing?: No Independently performs ADLs?: Yes (appropriate for developmental age) Does the patient have difficulty walking or climbing stairs?: Yes Weakness of Legs: Both Weakness of Arms/Hands: Both  Permission Sought/Granted Permission sought to share information with : Case Manager, Family Supports Permission granted to share information with : Yes, Verbal Permission Granted              Emotional Assessment Appearance:: Appears stated age Attitude/Demeanor/Rapport: Unable to Assess (Intubated) Affect (typically observed): Unable to Assess (Intubated) Orientation: : Oriented to Self Alcohol / Substance Use: Not Applicable Psych Involvement: No (comment)  Admission diagnosis:  Hypoxia [R09.02] Sepsis ([place]) [A41.9] Multifocal pneumonia [J18.9] Sepsis, due to unspecified organism,  unspecified whether acute organ dysfunction present [facility]) [A41.9] Patient Active Problem List   Diagnosis Date Noted   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   Chronic heart failure with preserved ejection fraction (HFpEF) ([place]) 04/01/2022   Epidural abscess 04/01/2022   Malnutrition of  moderate degree 03/31/2022   Pressure injury of skin 03/31/2022   Sepsis ([place]) 03/30/2022   Urinary tract infection without hematuria 03/20/2022   Acute cystitis without hematuria 03/19/2022   Multifocal pneumonia 03/19/2022   Acute respiratory failure with hypoxia ([facility]) 03/19/2022   Bacterial spinal epidural abscess - MRSA 03/19/2022   S/P lumbar laminectomy 03/01/2022   Ureteral obstruction, right 02/28/2022   Spinal stenosis of lumbar region with neurogenic claudication 02/28/2022   Pyelonephritis 01/04/2022   Sepsis without acute organ dysfunction ([place]) 01/04/2022   Hyponatremia 01/04/2022   DM2 (diabetes mellitus, type 2) ([place]) 01/04/2022   Essential hypertension 01/04/2022   PCP:  Katherina Mires, MD Pharmacy:   [facility] [place] [place] [facility] [address] [place] [address] Phone: [phone] Fax: [phone]  [facility] [address] [place] [address] Phone: [phone] Fax: [phone]     Social Determinants of Health (SDOH) Social History: SDOH Screenings   Food Insecurity: No Food Insecurity (03/31/2022)  Housing: Low Risk  (03/31/2022)  Transportation Needs: No Transportation Needs (03/31/2022)  Utilities: Not At Risk (03/31/2022)  Tobacco Use: Medium Risk (03/31/2022)   SDOH Interventions:     Readmission Risk Interventions    01/06/2022    9:26 AM  Readmission Risk Prevention Plan  Transportation Screening Complete  PCP or Specialist Appt within 5-7 Days Complete  Home Care Screening Complete  Medication Review (RN CM) Complete

## 2022-04-01 NOTE — Inpatient Diabetes Management (Signed)
Inpatient Diabetes Program Recommendations  AACE/ADA: New Consensus Statement on Inpatient Glycemic Control   Target Ranges:  Prepandial:   less than 140 mg/dL      Peak postprandial:   less than 180 mg/dL (1-2 hours)      Critically ill patients:  140 - 180 mg/dL    Latest Reference Range & Units 04/01/22 03:18 04/01/22 07:55  Glucose-Capillary 70 - 99 mg/dL 215 (H) 300 (H)    Latest Reference Range & Units 03/31/22 07:23 03/31/22 09:26 03/31/22 11:36 03/31/22 15:44 03/31/22 19:23 03/31/22 23:11  Glucose-Capillary 70 - 99 mg/dL 88 87 157 (H) 131 (H) 166 (H) 201 (H)   Review of Glycemic Control  Diabetes history: DM2 Outpatient Diabetes medications: Amaryl 4 mg daily, Metformin 1000 mg BID Current orders for Inpatient glycemic control: Novolog 0-15 units Q4H; Solumedrol 100 mg Q12H, Vital @ 50 ml/hr  Inpatient Diabetes Program Recommendations:    Insulin: CBGs today 215-300 mg/dl.  If steroids are continued, please consider ordering Semglee 7 units Q24H.  Please consider ordering Novolog 4 units Q4H for tube feeding coverage. If tube feeding is stopped or held then Novolog tube feeding coverage should also be stopped or held.  Thanks, Barnie Alderman, RN, MSN, [place] Diabetes Coordinator Inpatient Diabetes Program [phone] (Team Pager from 8am to [place])

## 2022-04-02 ENCOUNTER — Inpatient Hospital Stay (HOSPITAL_COMMUNITY): Payer: Medicare HMO

## 2022-04-02 DIAGNOSIS — J9601 Acute respiratory failure with hypoxia: Secondary | ICD-10-CM | POA: Diagnosis not present

## 2022-04-02 DIAGNOSIS — G062 Extradural and subdural abscess, unspecified: Secondary | ICD-10-CM | POA: Diagnosis not present

## 2022-04-02 DIAGNOSIS — I5032 Chronic diastolic (congestive) heart failure: Secondary | ICD-10-CM | POA: Diagnosis not present

## 2022-04-02 DIAGNOSIS — J8281 Chronic eosinophilic pneumonia: Secondary | ICD-10-CM | POA: Diagnosis not present

## 2022-04-02 LAB — BASIC METABOLIC PANEL
Anion gap: 7 (ref 5–15)
BUN: 49 mg/dL — ABNORMAL HIGH (ref 8–23)
CO2: 22 mmol/L (ref 22–32)
Calcium: 9.1 mg/dL (ref 8.9–10.3)
Chloride: 104 mmol/L (ref 98–111)
Creatinine, Ser: 0.85 mg/dL (ref 0.44–1.00)
GFR, Estimated: >60 mL/min (ref >60)
Glucose, Bld: 381 mg/dL — ABNORMAL HIGH (ref 70–99)
Potassium: 4.8 mmol/L (ref 3.5–5.1)
Sodium: 133 mmol/L — ABNORMAL LOW (ref 135–145)

## 2022-04-02 LAB — COMPREHENSIVE METABOLIC PANEL WITH GFR
ALT: 21 U/L (ref 0–44)
AST: 14 U/L — ABNORMAL LOW (ref 15–41)
Albumin: 2 g/dL — ABNORMAL LOW (ref 3.5–5.0)
Alkaline Phosphatase: 104 U/L (ref 38–126)
Anion gap: 9 (ref 5–15)
BUN: 43 mg/dL — ABNORMAL HIGH (ref 8–23)
CO2: 20 mmol/L — ABNORMAL LOW (ref 22–32)
Calcium: 9.3 mg/dL (ref 8.9–10.3)
Chloride: 103 mmol/L (ref 98–111)
Creatinine, Ser: 0.89 mg/dL (ref 0.44–1.00)
GFR, Estimated: >60 mL/min
Glucose, Bld: 260 mg/dL — ABNORMAL HIGH (ref 70–99)
Potassium: 4.8 mmol/L (ref 3.5–5.1)
Sodium: 132 mmol/L — ABNORMAL LOW (ref 135–145)
Total Bilirubin: 0.3 mg/dL (ref 0.3–1.2)
Total Protein: 5.8 g/dL — ABNORMAL LOW (ref 6.5–8.1)

## 2022-04-02 LAB — GLUCOSE, CAPILLARY
Glucose-Capillary: 274 mg/dL — ABNORMAL HIGH (ref 70–99)
Glucose-Capillary: 211 mg/dL — ABNORMAL HIGH (ref 70–99)
Glucose-Capillary: 244 mg/dL — ABNORMAL HIGH (ref 70–99)
Glucose-Capillary: 323 mg/dL — ABNORMAL HIGH (ref 70–99)
Glucose-Capillary: 352 mg/dL — ABNORMAL HIGH (ref 70–99)
Glucose-Capillary: 185 mg/dL — ABNORMAL HIGH (ref 70–99)

## 2022-04-02 LAB — CBC WITH DIFFERENTIAL/PLATELET
Abs Immature Granulocytes: 0.34 10*3/uL — ABNORMAL HIGH (ref 0.00–0.07)
Basophils Absolute: 0 10*3/uL (ref 0.0–0.1)
Basophils Relative: 0 %
Eosinophils Absolute: 0 10*3/uL (ref 0.0–0.5)
Eosinophils Relative: 0 %
HCT: 23.7 % — ABNORMAL LOW (ref 36.0–46.0)
Hemoglobin: 7.8 g/dL — ABNORMAL LOW (ref 12.0–15.0)
Immature Granulocytes: 2 %
Lymphocytes Relative: 3 %
Lymphs Abs: 0.6 10*3/uL — ABNORMAL LOW (ref 0.7–4.0)
MCH: 28.7 pg (ref 26.0–34.0)
MCHC: 32.9 g/dL (ref 30.0–36.0)
MCV: 87.1 fL (ref 80.0–100.0)
Monocytes Absolute: 0.2 10*3/uL (ref 0.1–1.0)
Monocytes Relative: 1 %
Neutro Abs: 18.8 10*3/uL — ABNORMAL HIGH (ref 1.7–7.7)
Neutrophils Relative %: 94 %
Platelets: 411 10*3/uL — ABNORMAL HIGH (ref 150–400)
RBC: 2.72 MIL/uL — ABNORMAL LOW (ref 3.87–5.11)
RDW: 15.8 % — ABNORMAL HIGH (ref 11.5–15.5)
WBC: 19.9 10*3/uL — ABNORMAL HIGH (ref 4.0–10.5)
nRBC: 0 % (ref 0.0–0.2)

## 2022-04-02 LAB — VANCOMYCIN, TROUGH: Vancomycin Tr: 15 ug/mL (ref 15–20)

## 2022-04-02 LAB — CULTURE, RESPIRATORY W GRAM STAIN: Culture: NO GROWTH

## 2022-04-02 LAB — MAGNESIUM
Magnesium: 2.3 mg/dL (ref 1.7–2.4)
Magnesium: 2.1 mg/dL (ref 1.7–2.4)

## 2022-04-02 LAB — CYTOLOGY - NON PAP

## 2022-04-02 LAB — PHOSPHORUS
Phosphorus: 3.9 mg/dL (ref 2.5–4.6)
Phosphorus: 3.7 mg/dL (ref 2.5–4.6)

## 2022-04-02 MED ORDER — SENNOSIDES-DOCUSATE SODIUM 8.6-50 MG PO TABS: 2.0000 | ORAL_TABLET | Freq: Every day | ORAL | Status: DC

## 2022-04-02 MED ORDER — INSULIN GLARGINE-YFGN 100 UNIT/ML ~~LOC~~ SOLN
10.0000 [IU] | Freq: Every day | SUBCUTANEOUS | Status: DC
  Administered 2022-04-03 – 2022-04-04 (×2): 10 [IU] via SUBCUTANEOUS
  Filled 2022-04-02 (×2): qty 0.1

## 2022-04-02 MED ORDER — POLYETHYLENE GLYCOL 3350 17 G PO PACK
17.0000 g | PACK | Freq: Two times a day (BID) | ORAL | Status: DC
  Administered 2022-04-02 – 2022-04-05 (×6): 17 g
  Filled 2022-04-02 (×7): qty 1

## 2022-04-02 MED ORDER — INSULIN ASPART 100 UNIT/ML IJ SOLN
6.0000 [IU] | INTRAMUSCULAR | Status: DC
  Administered 2022-04-02 – 2022-04-05 (×18): 6 [IU] via SUBCUTANEOUS

## 2022-04-02 MED ORDER — SENNOSIDES-DOCUSATE SODIUM 8.6-50 MG PO TABS
2.0000 | ORAL_TABLET | Freq: Every day | ORAL | Status: DC
  Administered 2022-04-02 – 2022-04-04 (×3): 2
  Filled 2022-04-02 (×3): qty 2

## 2022-04-02 MED ORDER — ASPIRIN 81 MG PO CHEW
81.0000 mg | CHEWABLE_TABLET | Freq: Every day | ORAL | Status: DC
  Administered 2022-04-02 – 2022-04-05 (×4): 81 mg
  Filled 2022-04-02 (×4): qty 1

## 2022-04-02 MED ORDER — VANCOMYCIN HCL IN DEXTROSE 1-5 GM/200ML-% IV SOLN
1000.0000 mg | INTRAVENOUS | Status: DC
  Administered 2022-04-02 – 2022-04-05 (×4): 1000 mg via INTRAVENOUS
  Filled 2022-04-02 (×4): qty 200

## 2022-04-02 MED ORDER — INSULIN ASPART 100 UNIT/ML IJ SOLN
0.0000 [IU] | INTRAMUSCULAR | Status: DC
  Administered 2022-04-02: 4 [IU] via SUBCUTANEOUS
  Administered 2022-04-02: 15 [IU] via SUBCUTANEOUS
  Administered 2022-04-02: 20 [IU] via SUBCUTANEOUS
  Administered 2022-04-03: 4 [IU] via SUBCUTANEOUS
  Administered 2022-04-03 (×2): 7 [IU] via SUBCUTANEOUS
  Administered 2022-04-03: 4 [IU] via SUBCUTANEOUS
  Administered 2022-04-03: 7 [IU] via SUBCUTANEOUS
  Administered 2022-04-04: 11 [IU] via SUBCUTANEOUS
  Administered 2022-04-04: 3 [IU] via SUBCUTANEOUS
  Administered 2022-04-04 (×3): 4 [IU] via SUBCUTANEOUS
  Administered 2022-04-04 – 2022-04-05 (×2): 20 [IU] via SUBCUTANEOUS
  Administered 2022-04-05: 15 [IU] via SUBCUTANEOUS
  Administered 2022-04-05 (×2): 20 [IU] via SUBCUTANEOUS
  Administered 2022-04-05: 11 [IU] via SUBCUTANEOUS

## 2022-04-02 MED ORDER — FUROSEMIDE 10 MG/ML IJ SOLN
40.0000 mg | Freq: Once | INTRAMUSCULAR | Status: AC
  Administered 2022-04-02: 40 mg via INTRAVENOUS
  Filled 2022-04-02: qty 4

## 2022-04-02 NOTE — Progress Notes (Signed)
Pharmacy Antibiotic Note  Melissa Leon is a 77 y.o. female admitted on 03/30/2022 with sepsis.  She has a Hx of MRSA epidural abscess and has been treated with daptomycin outpatient with an EOT of 04/12/22. CXR was consistent with multifocal PNA, likely eosinophilic PNA in the setting of prolonged daptomycin treatment. Pharmacy has been consulted for vancomycin dosing for MRSA epidural abscess.   3/14 Vpk 37, Vtr 15 drawn appropriately.   Plan: Adjust Vancomycin '1000mg'$  Q24h for eAUC482.8 based on recent levels.  Monitor renal function and levels for dose adjustments as indicated.     Temp (24hrs), Avg:97.4 F (36.3 C), Min:97.1 F (36.2 C), Max:97.9 F (36.6 C)  Recent Labs  Lab 03/30/22 1218 03/30/22 1941 03/31/22 0208 04/01/22 0129 04/01/22 1738 04/02/22 0524 04/02/22 1300  WBC 16.9*  --  17.9* 17.4*  --  19.9*  --   CREATININE 0.89  --  0.82 0.84  --  0.89  --   LATICACIDVEN 3.3* 1.1  --   --   --   --   --   VANCOTROUGH  --   --   --   --   --   --  15  VANCOPEAK  --   --   --   --  37  --   --      Estimated Creatinine Clearance: 57.4 mL/min (by C-G formula based on SCr of 0.89 mg/dL).    Allergies  Allergen Reactions   Daptomycin Other (See Comments)    Concern for eosinophilic PNA   Choline Fenofibrate Other (See Comments)    ELEVATED LFT.    Penicillins Hives   Ozempic (0.25 Or 0.5 Mg-Dose) [Semaglutide(0.25 Or 0.'5mg'$ -Dos)] Nausea Only   Statins Other (See Comments)    Aches, pain    Thank you for allowing pharmacy to be a part of this patient's care.  Antimicrobials this admission:  Vanc 3/11 >> CTX 3/11 >>3/14  Dose adjustments: 3/14 Vanc 1250 Q24h > 1000 Q24h  Cultures: 3/11 Bcx: ngtd 3/12 bronchial lavage: ngtd 3/12 resp panel negative 3/12 MRSA PCR negative 3/12 Acid fast culture: pending 3/12 fungus culture: pending 3/12 AFB: pending 3/12 pneumocystis smear: pending  Titus Dubin, PharmD PGY1 Pharmacy Resident 04/02/2022 3:26 PM

## 2022-04-02 NOTE — Progress Notes (Signed)
PT Cancellation Note  Patient Details Name: Melissa Leon MRN: [id number] DOB: February 13, 1945   Cancelled Treatment:    Reason Eval/Treat Not Completed: Medical issues which prohibited therapy (Failed extubation this am.  Nurse asked PT to check back tomorrow.)   Alvira Philips 04/02/2022, 9:54 AM Alioune Hodgkin M,PT Acute Rehab Services [phone]

## 2022-04-02 NOTE — Progress Notes (Addendum)
NAME:  Melissa Leon, MRN:  [id number], DOB:  06/22/1945, LOS: 3 ADMISSION DATE:  03/30/2022, CONSULTATION DATE:  03/31/2022 REFERRING MD:  Dr. 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   Pt initially was seen in 12/2021 for E. Coli UTI with severe hydronephrosis on the right secondary to ureteral obstruction requiring percutaneous nephrostomy tube placement. She underwent ureteral stent placement in 02/2022 after multiple rounds of antibiotics. Later in 02/2022 she was found to have a lumbar epidural abscess which was operated on and grew MRSA. TTE at that time without vegetations noted. She was discharged with a RUE PICC line on IV daptomycin started 03/06/2022 with planned 8 weeks of therapy to end on 04/12/2022. Following discharge she was admitted again with pneumonia on 03/19/2022. Daptomycin was held during that admission and restarted on discharge 03/25/2022. She was again admitted on 03/30/2022 with generalized weakness, hypothermia, cough, and dyspnea. She was found to have worsening infiltrates bilaterally on CXR and CT. With concerns for daptomycin induced eosinophilic pneumonia she was switched to ceftriaxone and vancomycin.   She has had progressive respiratory decline and is now on maximal support with HFNC 40L at 100% FiO2. She continues to have increased work of breathing, tachypnea, and diaphragmatic paradoxus while maintaining O2 sats of 96. Blood cultures are preliminarily negative. She is having worsening back pain similar to her prior lumbar abscess. She does not have other acute complaints.   Pertinent  Medical History  T2DM HTN HFpEF Spinal Stenosis Chronic IV antibiotic therapy with  daptomycin after MRSA+ lumbar spinal abscess R Ureteral obstruction with resultant hydronephrosis and pyelonephritis s/p JJ stent  Significant Hospital Events: Including procedures, antibiotic start and stop dates in addition to other pertinent events   3/11 admitted with sepsis and worsening multifocal pneumonia on chronic daptomycin, changed to ceftriaxone and vancomycin 3/12 transfer to MICU for intubation 2/2 progressive respiratory distress on HFNC 40L 100% FiO2  Interim History / Subjective:  Patient was weaning well on 5/5 with Vt in the 800 range and rates int he high teens. Sats 95% on 40%. We were planning to extubate, but as her sedation was further weaned and she became more agitated she desaturated into the 80s and was placed back on full support.   Objective   Blood pressure 133/63, pulse 62, temperature (!) 97.1 F (36.2 C), temperature source Axillary, resp. rate 18, height '5\' 7"'$  (1.702 m), weight 76.5 kg, SpO2 100 %.    Vent Mode: CPAP;PSV FiO2 (%):  [40 %-50 %] 40 % Set Rate:  [24 bmp] 24 bmp Vt Set:  [490 mL] 490 mL PEEP:  [5 cmH20-8 cmH20] 5 cmH20 Pressure Support:  [5 cmH20-10 cmH20] 10 cmH20 Plateau Pressure:  [14 cmH20-22 cmH20] 22 cmH20   Intake/Output Summary (Last 24 hours) at 04/02/2022 0843 Last data filed at 04/02/2022 0700 Gross per 24 hour  Intake 2554.81 ml  Output 695 ml  Net 1859.81 ml    Filed Weights   03/31/22 1045 04/01/22 0330  Weight: 76 kg 76.5 kg    Examination:   General: elderly female on vent HENT: [place]/AT, PERRL, no JVD Lungs: Rhonchi throuhgout Cardiovascular: RRR, no MRG. Pedal edema.  Abdomen: soft, NT, ND Extremities: no acute deformity or ROM limitation.  Neuro: RASS 0  MRI lumbar  spine: previously seen collection is resolved. Post surgical changes. New superior endplate edema L3. Mod L4-5 foraminal stenosis.   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 Lower than I would expect for true eosinophilic process.  RVP negative -Follow for BAL labs (cell count, culture, cytology, AFB, fungal culture, pneumocystis)  -Antibiotics per ID. Stopping CTX. Continue Vanco -Solumedrol -precedex/fentanyl gtt. RASS goal -1 to -2.  - Low dose NE to support sedation.  -Failed SBT this morning likely due to de-recruitment from agitation. Tolerated 5/5 well while sedated.  - Give lasix 40 mg. 4L positive and lower extremity edema.   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 Asymptomatic  -Supportive care  HFpEF CAD HTN -stable, will monitor fluid status -strict ins and outs -holding antihypertensives with low pressures -aspirin 81 mg daily -PRN hydralazine for SPB>160 - lasix as above  T2DM -Semglee 10 units - 6 units TF coverage - SSI  Anemia: 3 gram drop over the past 24 hours. Baseline back in feb was 12, more recently she has been in the 8-9 range. Today 7.2. - No obvious bleeding source - Check occult blood - send haptoglobin, INR, LDH.  - anemia panel  Best Practice (right click and "Reselect all SmartList Selections" daily)   Diet/type: NPO TF DVT prophylaxis: LMWH, SCD GI prophylaxis: N/A Lines: N/A Foley:  N/A Code Status:  full code Last date of multidisciplinary goals of care discussion [3/12]  Labs   CBC: Recent Labs  Lab 03/30/22 1218 03/31/22 0208 03/31/22 1113 04/01/22 0129 04/02/22 0524  WBC 16.9* 17.9*  --  17.4* 19.9*  NEUTROABS 15.5*  --   --  16.6* 18.8*  HGB 10.4* 8.6* 8.8* 7.2* 7.8*  HCT 32.0* 25.6*  26.0* 22.6* 23.7*  MCV 88.4 86.2  --  89.3 87.1  PLT 498* 413*  --  328 411*     Basic Metabolic Panel: Recent Labs  Lab 03/30/22 1218 03/31/22 0208 03/31/22 1113 03/31/22 1632 04/01/22 0129 04/01/22 1738 04/02/22 0524  NA 131* 130* 133*  --  132*  --  132*  K 3.7 3.3* 3.6  --  4.5  --  4.8  CL 96* 99  --   --  105  --  103  CO2 19* 21*  --   --  21*  --  20*  GLUCOSE 96 135*  --   --  203*  --  260*  BUN 25* 19  --   --  27*  --  43*  CREATININE 0.89 0.82  --   --  0.84  --  0.89  CALCIUM 9.5 8.3*  --   --  8.7*  --  9.3  MG  --   --   --  1.6* 2.9* 2.3 2.3  PHOS  --   --   --  4.1 4.4 3.7 3.9    GFR: Estimated Creatinine Clearance: 57.4 mL/min (by C-G formula based on SCr of 0.89 mg/dL). Recent Labs  Lab 03/30/22 1218 03/30/22 1941 03/31/22 0208 04/01/22 0129 04/02/22 0524  WBC 16.9*  --  17.9* 17.4* 19.9*  LATICACIDVEN  3.3* 1.1  --   --   --      Liver Function Tests: Recent Labs  Lab 03/30/22 1218 04/02/22 0524  AST 53* 14*  ALT 55* 21  ALKPHOS 117 104  BILITOT 1.0 0.3  PROT 7.1 5.8*  ALBUMIN 2.4* 2.0*    No results for input(s): "LIPASE", "AMYLASE" in the last 168 hours. No results for input(s): "AMMONIA" in the last 168 hours.  ABG    Component Value Date/Time   PHART 7.261 (L) 03/31/2022 1113   PCO2ART 49.2 (H) 03/31/2022 1113   PO2ART 224 (H) 03/31/2022 1113   HCO3 22.1 03/31/2022 1113   TCO2 24 03/31/2022 1113   ACIDBASEDEF 5.0 (H) 03/31/2022 1113   O2SAT 100 03/31/2022 1113     Coagulation Profile: Recent Labs  Lab 04/01/22 1000  INR 1.4*    Cardiac Enzymes: No results for input(s): "CKTOTAL", "CKMB", "CKMBINDEX", "TROPONINI" in the last 168 hours.  HbA1C: Hgb A1c MFr Bld  Date/Time Value Ref Range Status  01/04/2022 12:57 PM 5.6 4.8 - 5.6 % Final    Comment:    (NOTE)         Prediabetes: 5.7 - 6.4         Diabetes: >6.4         Glycemic control for adults with diabetes: <7.0     CBG: Recent Labs  Lab  04/01/22 1533 04/01/22 1946 04/01/22 2334 04/02/22 0332 04/02/22 0730  GLUCAP 224* 236* 265* 274* 211*     Review of Systems:   Dyspnea, back pain  Past Medical History:  She,  has a past medical history of Anemia, Arthritis, Depression, Diabetes mellitus ([place]), Hypertension, Pneumonia, and Spinal stenosis.   Surgical History:   Past Surgical History:  Procedure Laterality Date   APPENDECTOMY     CHOLECYSTECTOMY     CYSTOSCOPY WITH RETROGRADE PYELOGRAM, URETEROSCOPY AND STENT PLACEMENT Right 02/27/2022   Procedure: TURBT, CYSTOSCOPY WITH RIGHT RETROGRADE PYELOGRAM, RIGHT URETEROSCOPY, BIOPSY AND STENT PLACEMENT;  Surgeon: Janith Lima, MD;  Location: [facility];  Service: Urology;  Laterality: Right;  30 MINUTES NEEDED FOR CASE   IR NEPHROSTOMY PLACEMENT RIGHT  01/05/2022   LUMBAR LAMINECTOMY FOR EPIDURAL ABSCESS N/A 03/01/2022   Procedure: LUMBAR LAMINECTOMY LUMBAR THREE-FOUR, LUMBAR FOUR- FIVE FOR EPIDURAL ABSCESS;  Surgeon: Eustace Moore, MD;  Location: [facility];  Service: Neurosurgery;  Laterality: N/A;   UTERINE FIBROID SURGERY     WRIST GANGLION EXCISION Right      Social History:   reports that she has quit smoking. Her smoking use included cigarettes. She has a 0.50 pack-year smoking history. She has never used smokeless tobacco. She reports that she does not currently use alcohol. She reports that she does not use drugs.   Family History:  Her family history is negative for Breast cancer.   Allergies Allergies  Allergen Reactions   Daptomycin Other (See Comments)    Concern for eosinophilic PNA   Choline Fenofibrate Other (See Comments)    ELEVATED LFT.    Penicillins Hives   Ozempic (0.25 Or 0.5 Mg-Dose) [Semaglutide(0.25 Or 0.'5mg'$ -Dos)] Nausea Only   Statins Other (See Comments)    Aches, pain     Home Medications  Prior to Admission medications   Medication Sig Start Date End Date Taking? Authorizing Provider  acetaminophen (TYLENOL) 650 MG CR tablet Take  1,300 mg by mouth daily as needed for pain.   Yes [provider]  amLODipine (NORVASC) 5 MG tablet Take 5 mg  by mouth at bedtime. 03/27/21  Yes [provider]  aspirin 81 MG chewable tablet Chew 81 mg by mouth at bedtime.   Yes [provider]  daptomycin (CUBICIN) IVPB Inject 650 mg into the vein daily for 20 days. Indication:  MRSA epidural abscess First Dose: Yes Last Day of Therapy:  04/12/22 Labs - Once weekly:  CBC/D, BMP, and CPK Labs - Every other week:  ESR and CRP Method of administration: IV Push Pull PICC line at the completion of IV antibiotics Method of administration may be changed at the discretion of home infusion pharmacist based upon assessment of the patient and/or caregiver's ability to self-administer the medication ordered. 03/25/22 04/14/22 Yes Pokhrel, Corrie Mckusick, MD  flavoxATE (URISPAS) 100 MG tablet Take 1 tablet (100 mg total) by mouth 3 (three) times daily as needed for bladder spasms. 03/25/22  Yes Pokhrel, Laxman, MD  glimepiride (AMARYL) 4 MG tablet Take 4 mg by mouth daily. 01/28/22  Yes [provider]  hydrochlorothiazide (HYDRODIURIL) 25 MG tablet Take 25 mg by mouth daily. 01/15/22  Yes [provider]  losartan (COZAAR) 100 MG tablet Take 100 mg by mouth in the morning. 04/21/21  Yes [provider]  metFORMIN (GLUCOPHAGE-XR) 500 MG 24 hr tablet Take 1,000 mg by mouth 2 (two) times daily with a meal. 03/27/21  Yes [provider]     Critical care time: 54 minutes     Georgann Housekeeper, AGACNP-BC [place] for personal pager PCCM on call pager [phone] until 7pm. Please call Elink 7p-7a. [phone]  04/02/2022 8:43 AM

## 2022-04-02 NOTE — Progress Notes (Signed)
SLP Cancellation Note  Patient Details Name: Melissa Leon MRN: [id number] DOB: [birth year]   Cancelled treatment:       Reason Eval/Treat Not Completed: Medical issues which prohibited therapy;Other (comment) (failed extubation. SLP will continue to follow for readiness)   Sonia Baller, MA, CCC-SLP Speech Therapy

## 2022-04-02 NOTE — Progress Notes (Signed)
Subjective: No new complaints   Antibiotics:  Anti-infectives (From admission, onward)    Start     Dose/Rate Route Frequency Ordered Stop   03/31/22 1400  vancomycin (VANCOREADY) IVPB 1250 mg/250 mL        1,250 mg 166.7 mL/hr over 90 Minutes Intravenous Every 24 hours 03/30/22 1347     03/31/22 1000  cefTRIAXone (ROCEPHIN) 2 g in sodium chloride 0.9 % 100 mL IVPB        2 g 200 mL/hr over 30 Minutes Intravenous Every 24 hours 03/30/22 1730     03/30/22 1400  vancomycin (VANCOREADY) IVPB 1500 mg/300 mL        1,500 mg 150 mL/hr over 120 Minutes Intravenous  Once 03/30/22 1345 03/30/22 1946   03/30/22 1345  cefTRIAXone (ROCEPHIN) 2 g in sodium chloride 0.9 % 100 mL IVPB        2 g 200 mL/hr over 30 Minutes Intravenous  Once 03/30/22 1330 03/30/22 1456       Medications: Scheduled Meds:  aspirin  81 mg Per Tube QHS   Chlorhexidine Gluconate Cloth  6 each Topical Daily   docusate  100 mg Per Tube BID   enoxaparin (LOVENOX) injection  40 mg Subcutaneous Q24H   feeding supplement (PROSource TF20)  60 mL Per Tube Daily   Gerhardt's butt cream   Topical BID   guaiFENesin  400 mg Per Tube Q6H   insulin aspart  0-20 Units Subcutaneous Q4H   insulin aspart  6 Units Subcutaneous Q4H   [START ON 04/03/2022] insulin glargine-yfgn  10 Units Subcutaneous Daily   methylPREDNISolone (SOLU-MEDROL) injection  40 mg Intravenous Q12H   mouth rinse  15 mL Mouth Rinse Q2H   pantoprazole (PROTONIX) IV  40 mg Intravenous QHS   polyethylene glycol  17 g Per Tube BID   senna-docusate  2 tablet Per Tube QHS   sodium chloride flush  10-40 mL Intracatheter Q12H   Continuous Infusions:  sodium chloride 10 mL/hr at 04/02/22 1100   cefTRIAXone (ROCEPHIN)  IV Stopped (04/02/22 1002)   dexmedetomidine (PRECEDEX) IV infusion 0.6 mcg/kg/hr (04/02/22 1100)   feeding supplement (VITAL 1.5 CAL) 50 mL/hr at 04/02/22 1100   fentaNYL infusion INTRAVENOUS 200 mcg/hr (04/02/22 1100)    norepinephrine (LEVOPHED) Adult infusion Stopped (04/02/22 0931)   vancomycin Stopped (04/01/22 1546)   PRN Meds:.sodium chloride, bisacodyl, fentaNYL, hydrALAZINE, ipratropium-albuterol, midazolam, ondansetron **OR** ondansetron (ZOFRAN) IV, mouth rinse, sodium chloride flush    Objective: Weight change:   Intake/Output Summary (Last 24 hours) at 04/02/2022 1224 Last data filed at 04/02/2022 1100 Gross per 24 hour  Intake 2308.01 ml  Output 595 ml  Net 1713.01 ml    Blood pressure (!) 110/56, pulse 61, temperature (!) 97.1 F (36.2 C), temperature source Axillary, resp. rate 19, height '5\' 7"'$  (1.702 m), weight 76.5 kg, SpO2 96 %. Temp:  [97.1 F (36.2 C)-97.9 F (36.6 C)] 97.1 F (36.2 C) (03/14 1116) Pulse Rate:  [61-111] 61 (03/14 1205) Resp:  [12-24] 19 (03/14 1205) BP: (91-168)/(48-85) 110/56 (03/14 1100) SpO2:  [85 %-100 %] 96 % (03/14 1205) FiO2 (%):  [40 %-100 %] 50 % (03/14 1205)  Physical Exam: Physical Exam Constitutional:      General: She is not in acute distress.    Interventions: She is intubated.  HENT:     Head: Normocephalic and atraumatic.     Nose: Nose normal.  Cardiovascular:     Rate and Rhythm: Normal  rate.  Pulmonary:     Effort: She is intubated.  Abdominal:     General: There is no distension.  Neurological:     General: No focal deficit present.      CBC:    BMET Recent Labs    04/01/22 0129 04/02/22 0524  NA 132* 132*  K 4.5 4.8  CL 105 103  CO2 21* 20*  GLUCOSE 203* 260*  BUN 27* 43*  CREATININE 0.84 0.89  CALCIUM 8.7* 9.3      Liver Panel  Recent Labs    04/02/22 0524  PROT 5.8*  ALBUMIN 2.0*  AST 14*  ALT 21  ALKPHOS 104  BILITOT 0.3        Sedimentation Rate No results for input(s): "ESRSEDRATE" in the last 72 hours. C-Reactive Protein Recent Labs    04/01/22 1000  CRP 21.5*     Micro Results: Recent Results (from the past 720 hour(s))  Blood culture (routine x 2)     Status: None    Collection Time: 03/19/22  7:35 PM   Specimen: BLOOD RIGHT HAND  Result Value Ref Range Status   Specimen Description BLOOD RIGHT HAND  Final   Special Requests   Final    BOTTLES DRAWN AEROBIC ONLY Blood Culture adequate volume   Culture   Final    NO GROWTH 5 DAYS Performed at [facility], [address], [place], [place] [address]    Report Status 03/24/2022 FINAL  Final  Blood culture (routine x 2)     Status: None   Collection Time: 03/19/22  7:48 PM   Specimen: BLOOD RIGHT ARM  Result Value Ref Range Status   Specimen Description BLOOD RIGHT ARM  Final   Special Requests   Final    BOTTLES DRAWN AEROBIC AND ANAEROBIC Blood Culture adequate volume   Culture   Final    NO GROWTH 5 DAYS Performed at [facility], [place] [address], [place], [place] [address]    Report Status 03/24/2022 FINAL  Final  Resp panel by RT-PCR (RSV, Flu A&B, Covid) Anterior Nasal Swab     Status: None   Collection Time: 03/19/22 10:25 PM   Specimen: Anterior Nasal Swab  Result Value Ref Range Status   SARS Coronavirus 2 by RT PCR NEGATIVE NEGATIVE Final   Influenza A by PCR NEGATIVE NEGATIVE Final   Influenza B by PCR NEGATIVE NEGATIVE Final    Comment: (NOTE) The Xpert Xpress SARS-CoV-2/FLU/RSV plus assay is intended as an aid in the diagnosis of influenza from Nasopharyngeal swab specimens and should not be used as a sole basis for treatment. Nasal washings and aspirates are unacceptable for Xpert Xpress SARS-CoV-2/FLU/RSV testing.  Fact Sheet for Patients: EntrepreneurPulse.com.au  Fact Sheet for Healthcare Providers: IncredibleEmployment.be  This test is not yet approved or cleared by the [place] FDA and has been authorized for detection and/or diagnosis of SARS-CoV-2 by FDA under an Emergency Use Authorization (EUA). This EUA will remain in effect (meaning this test can be used) for the duration of the COVID-19 declaration under  Section 564(b)(1) of the Act, 21 U.S.C. section 360bbb-3(b)(1), unless the authorization is terminated or revoked.     Resp Syncytial Virus by PCR NEGATIVE NEGATIVE Final    Comment: (NOTE) Fact Sheet for Patients: EntrepreneurPulse.com.au  Fact Sheet for Healthcare Providers: IncredibleEmployment.be  This test is not yet approved or cleared by the [place] FDA and has been authorized for detection and/or diagnosis of SARS-CoV-2 by  FDA under an Emergency Use Authorization (EUA). This EUA will remain in effect (meaning this test can be used) for the duration of the COVID-19 declaration under Section 564(b)(1) of the Act, 21 U.S.C. section 360bbb-3(b)(1), unless the authorization is terminated or revoked.  Performed at [facility], [place] [address] [place], [place] [address]   Urine Culture     Status: Abnormal   Collection Time: 03/20/22 12:45 AM   Specimen: Urine, Random  Result Value Ref Range Status   Specimen Description URINE, RANDOM  Final   Special Requests   Final    NONE Reflexed from [id number] Performed at [facility], [place] [address] [place], [place] [address]    Culture 10,000 COLONIES/mL YEAST (A)  Final   Report Status 03/21/2022 FINAL  Final  MRSA Next Gen by PCR, Nasal     Status: None   Collection Time: 03/20/22  8:15 AM   Specimen: Nasal Mucosa; Nasal Swab  Result Value Ref Range Status   MRSA by PCR Next Gen NOT DETECTED NOT DETECTED Final    Comment: (NOTE) The GeneXpert MRSA Assay (FDA approved for NASAL specimens only), is one component of a comprehensive MRSA colonization surveillance program. It is not intended to diagnose MRSA infection nor to guide or monitor treatment for MRSA infections. Test performance is not FDA approved in patients less than 31 years old. Performed at [facility], [place] [address], [place], [place] [address]   Respiratory (~20 pathogens) panel by PCR     Status: None    Collection Time: 03/20/22  8:16 AM   Specimen: Nasopharyngeal Swab; Respiratory  Result Value Ref Range Status   Adenovirus NOT DETECTED NOT DETECTED Final   Coronavirus 229E NOT DETECTED NOT DETECTED Final    Comment: (NOTE) The Coronavirus on the Respiratory Panel, DOES NOT test for the novel  Coronavirus (2019 nCoV)    Coronavirus HKU1 NOT DETECTED NOT DETECTED Final   Coronavirus NL63 NOT DETECTED NOT DETECTED Final   Coronavirus OC43 NOT DETECTED NOT DETECTED Final   Metapneumovirus NOT DETECTED NOT DETECTED Final   Rhinovirus / Enterovirus NOT DETECTED NOT DETECTED Final   Influenza A NOT DETECTED NOT DETECTED Final   Influenza B NOT DETECTED NOT DETECTED Final   Parainfluenza Virus 1 NOT DETECTED NOT DETECTED Final   Parainfluenza Virus 2 NOT DETECTED NOT DETECTED Final   Parainfluenza Virus 3 NOT DETECTED NOT DETECTED Final   Parainfluenza Virus 4 NOT DETECTED NOT DETECTED Final   Respiratory Syncytial Virus NOT DETECTED NOT DETECTED Final   Bordetella pertussis NOT DETECTED NOT DETECTED Final   Bordetella Parapertussis NOT DETECTED NOT DETECTED Final   Chlamydophila pneumoniae NOT DETECTED NOT DETECTED Final   Mycoplasma pneumoniae NOT DETECTED NOT DETECTED Final    Comment: Performed at [facility], [place]. [address], [place], [place] [address]  Culture, blood (routine x 2)     Status: None (Preliminary result)   Collection Time: 03/30/22 12:18 PM   Specimen: BLOOD  Result Value Ref Range Status   Specimen Description BLOOD LEFT ANTECUBITAL  Final   Special Requests   Final    BOTTLES DRAWN AEROBIC AND ANAEROBIC Blood Culture adequate volume   Culture   Final    NO GROWTH 3 DAYS Performed at [facility], [place] [address], [place], [place] [address]    Report Status PENDING  Incomplete  Culture, blood (routine x 2)     Status: None (Preliminary result)  Collection Time: 03/30/22  7:42 PM   Specimen: BLOOD  Result Value Ref Range Status   Specimen  Description BLOOD LEFT ANTECUBITAL  Final   Special Requests   Final    BOTTLES DRAWN AEROBIC AND ANAEROBIC Blood Culture results may not be optimal due to an inadequate volume of blood received in culture bottles   Culture   Final    NO GROWTH 3 DAYS Performed at [facility], [place] [address], [place], [place] [address]    Report Status PENDING  Incomplete  Culture, Respiratory w Gram Stain     Status: None   Collection Time: 03/31/22 10:13 AM   Specimen: Bronchoalveolar Lavage; Respiratory  Result Value Ref Range Status   Specimen Description BRONCHIAL ALVEOLAR LAVAGE  Final   Special Requests NONE  Final   Gram Stain   Final    FEW WBC PRESENT, PREDOMINANTLY PMN NO ORGANISMS SEEN    Culture   Final    NO GROWTH 2 DAYS Performed at [facility], [address] [place], [place] [address]    Report Status 04/02/2022 FINAL  Final  Respiratory (~20 pathogens) panel by PCR     Status: None   Collection Time: 03/31/22 10:13 AM   Specimen: Bronchoalveolar Lavage; Respiratory  Result Value Ref Range Status   Adenovirus NOT DETECTED NOT DETECTED Final   Coronavirus 229E NOT DETECTED NOT DETECTED Final    Comment: (NOTE) The Coronavirus on the Respiratory Panel, DOES NOT test for the novel  Coronavirus (2019 nCoV)    Coronavirus HKU1 NOT DETECTED NOT DETECTED Final   Coronavirus NL63 NOT DETECTED NOT DETECTED Final   Coronavirus OC43 NOT DETECTED NOT DETECTED Final   Metapneumovirus NOT DETECTED NOT DETECTED Final   Rhinovirus / Enterovirus NOT DETECTED NOT DETECTED Final   Influenza A NOT DETECTED NOT DETECTED Final   Influenza B NOT DETECTED NOT DETECTED Final   Parainfluenza Virus 1 NOT DETECTED NOT DETECTED Final   Parainfluenza Virus 2 NOT DETECTED NOT DETECTED Final   Parainfluenza Virus 3 NOT DETECTED NOT DETECTED Final   Parainfluenza Virus 4 NOT DETECTED NOT DETECTED Final   Respiratory Syncytial Virus NOT DETECTED NOT DETECTED Final   Bordetella pertussis  NOT DETECTED NOT DETECTED Final   Bordetella Parapertussis NOT DETECTED NOT DETECTED Final   Chlamydophila pneumoniae NOT DETECTED NOT DETECTED Final   Mycoplasma pneumoniae NOT DETECTED NOT DETECTED Final    Comment: Performed at [facility], [place]. [address] [place], [place] [address]  MRSA Next Gen by PCR, Nasal     Status: None   Collection Time: 03/31/22 11:30 AM   Specimen: Nasal Mucosa; Nasal Swab  Result Value Ref Range Status   MRSA by PCR Next Gen NOT DETECTED NOT DETECTED Final    Comment: (NOTE) The GeneXpert MRSA Assay (FDA approved for NASAL specimens only), is one component of a comprehensive MRSA colonization surveillance program. It is not intended to diagnose MRSA infection nor to guide or monitor treatment for MRSA infections. Test performance is not FDA approved in patients less than 55 years old. Performed at [facility], [place] [address], [place], [place] [address]     Studies/Results: [place] CHEST PORT 1 VIEW  Result Date: 04/02/2022 CLINICAL DATA:  Respiratory failure. EXAM: PORTABLE CHEST 1 VIEW COMPARISON:  03/31/2022 FINDINGS: The endotracheal tube is 2.8 cm above the carina. The NG tube is coursing down the esophagus and into the stomach. The right PICC line is stable  with its tip in the distal SVC. Persistent diffuse but asymmetric airspace process in the lungs. No large pleural effusions or pneumothorax. IMPRESSION: 1. Stable support apparatus. 2. Persistent diffuse but asymmetric airspace process in the lungs. Electronically Signed   By: Marijo Sanes M.D.   On: 04/02/2022 11:56   MR Lumbar Spine W Wo Contrast  Result Date: 04/01/2022 CLINICAL DATA:  Low back pain EXAM: MRI LUMBAR SPINE WITHOUT AND WITH CONTRAST TECHNIQUE: Multiplanar and multiecho pulse sequences of the lumbar spine were obtained without and with intravenous contrast. CONTRAST:  23m GADAVIST GADOBUTROL 1 MMOL/ML IV SOLN COMPARISON:  02/28/2022 FINDINGS: Segmentation: Transitional  lumbosacral anatomy with partial sacralization of L5. Numbering unchanged from prior study. Alignment:  Physiologic. Vertebrae: New superior endplate edema at L3. Status post left laminectomies at L3 and L4. Conus medullaris and cauda equina: Conus extends to the L1 level. Conus and cauda equina appear normal. Previously seen epidural collection has resolved. Paraspinal and other soft tissues: Small amount of dorsal paraspinal fluid at the L3 and L4 levels. Fatty atrophy of the paraspinous muscles. No psoas abscess. Right hydronephrosis. Disc levels: L1-L2: Normal disc space and facet joints. No spinal canal stenosis. No neural foraminal stenosis. L2-L3: Unchanged small disc bulge with mild facet hypertrophy. Mild spinal canal stenosis. No neural foraminal stenosis. L3-L4: Status post left laminectomy. Small disc bulge with severe facet arthrosis. Improved patency of the spinal canal with moderate residual spinal canal stenosis. Mild bilateral neural foraminal stenosis. L4-L5: Left laminectomy with improved patency of the spinal canal. Mild spinal canal stenosis. Moderate facet arthrosis. Moderate right neural foraminal stenosis. L5-S1: Transitional level. No spinal canal stenosis. No neural foraminal stenosis. Visualized sacrum: Normal. IMPRESSION: 1. Previously seen epidural collection has resolved. 2. Status post left laminectomies at L3 and L4 with improved patency of the spinal canal at both levels. Moderate residual L3-4 and mild L4-5 spinal canal stenosis. 3. New superior endplate edema at L3 may be a source of local low back pain. No abnormal signal within the disc space to indicate discitis. 4. Moderate right L4-5 neural foraminal stenosis. Electronically Signed   By: KUlyses JarredM.D.   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  #1 Eosinophilic pneumonia:  Would continue corticosteroids and supportive care.  Will discontinue ceftriaxone   #2 vertebral osteomyelitis discitis epidural abscess: MRI of the spine is encouraging.  There are some endplate edema L3  Continue vancomycin  Think as she improves and hopefully comes off the ventilator and out of the ICU we can look at a plan of transitioning her to oral antibiotics to complete treatment.  I spent 54 minutes with the patient including than 50% of the time in face to face and  non face to face time  with the patient personally reviewing CXR along with review of medical records in preparation for the visit and during the visit and in coordination of her care.    LOS: 3 days   Alcide Evener 04/02/2022, 12:24 PM

## 2022-04-03 ENCOUNTER — Inpatient Hospital Stay (HOSPITAL_COMMUNITY): Payer: Medicare HMO

## 2022-04-03 DIAGNOSIS — G062 Extradural and subdural abscess, unspecified: Secondary | ICD-10-CM | POA: Diagnosis not present

## 2022-04-03 DIAGNOSIS — J8281 Chronic eosinophilic pneumonia: Secondary | ICD-10-CM | POA: Diagnosis not present

## 2022-04-03 DIAGNOSIS — I5032 Chronic diastolic (congestive) heart failure: Secondary | ICD-10-CM | POA: Diagnosis not present

## 2022-04-03 DIAGNOSIS — J9601 Acute respiratory failure with hypoxia: Secondary | ICD-10-CM | POA: Diagnosis not present

## 2022-04-03 LAB — BASIC METABOLIC PANEL
Anion gap: 6 (ref 5–15)
BUN: 53 mg/dL — ABNORMAL HIGH (ref 8–23)
CO2: 24 mmol/L (ref 22–32)
Calcium: 9 mg/dL (ref 8.9–10.3)
Chloride: 103 mmol/L (ref 98–111)
Creatinine, Ser: 0.71 mg/dL (ref 0.44–1.00)
GFR, Estimated: >60 mL/min (ref >60)
Glucose, Bld: 200 mg/dL — ABNORMAL HIGH (ref 70–99)
Potassium: 4.9 mmol/L (ref 3.5–5.1)
Sodium: 133 mmol/L — ABNORMAL LOW (ref 135–145)

## 2022-04-03 LAB — CBC WITH DIFFERENTIAL/PLATELET
Abs Immature Granulocytes: 0.26 10*3/uL — ABNORMAL HIGH (ref 0.00–0.07)
Basophils Absolute: 0 10*3/uL (ref 0.0–0.1)
Basophils Relative: 0 %
Eosinophils Absolute: 0 10*3/uL (ref 0.0–0.5)
Eosinophils Relative: 0 %
HCT: 24.3 % — ABNORMAL LOW (ref 36.0–46.0)
Hemoglobin: 7.6 g/dL — ABNORMAL LOW (ref 12.0–15.0)
Immature Granulocytes: 2 %
Lymphocytes Relative: 3 %
Lymphs Abs: 0.5 10*3/uL — ABNORMAL LOW (ref 0.7–4.0)
MCH: 27.9 pg (ref 26.0–34.0)
MCHC: 31.3 g/dL (ref 30.0–36.0)
MCV: 89.3 fL (ref 80.0–100.0)
Monocytes Absolute: 0.3 10*3/uL (ref 0.1–1.0)
Monocytes Relative: 2 %
Neutro Abs: 16.8 10*3/uL — ABNORMAL HIGH (ref 1.7–7.7)
Neutrophils Relative %: 93 %
Platelets: 379 10*3/uL (ref 150–400)
RBC: 2.72 MIL/uL — ABNORMAL LOW (ref 3.87–5.11)
RDW: 15.9 % — ABNORMAL HIGH (ref 11.5–15.5)
WBC: 17.9 10*3/uL — ABNORMAL HIGH (ref 4.0–10.5)
nRBC: 0 % (ref 0.0–0.2)

## 2022-04-03 LAB — GLUCOSE, CAPILLARY
Glucose-Capillary: 197 mg/dL — ABNORMAL HIGH (ref 70–99)
Glucose-Capillary: 196 mg/dL — ABNORMAL HIGH (ref 70–99)
Glucose-Capillary: 223 mg/dL — ABNORMAL HIGH (ref 70–99)
Glucose-Capillary: 232 mg/dL — ABNORMAL HIGH (ref 70–99)
Glucose-Capillary: 242 mg/dL — ABNORMAL HIGH (ref 70–99)
Glucose-Capillary: 253 mg/dL — ABNORMAL HIGH (ref 70–99)

## 2022-04-03 LAB — MAGNESIUM: Magnesium: 2.1 mg/dL (ref 1.7–2.4)

## 2022-04-03 LAB — PHOSPHORUS: Phosphorus: 3.7 mg/dL (ref 2.5–4.6)

## 2022-04-03 LAB — HAPTOGLOBIN: Haptoglobin: 210 mg/dL (ref 42–346)

## 2022-04-03 MED ORDER — FUROSEMIDE 10 MG/ML IJ SOLN
40.0000 mg | Freq: Two times a day (BID) | INTRAMUSCULAR | Status: AC
  Administered 2022-04-03 (×2): 40 mg via INTRAVENOUS
  Filled 2022-04-03 (×2): qty 4

## 2022-04-03 MED ORDER — QUETIAPINE FUMARATE 25 MG PO TABS
25.0000 mg | ORAL_TABLET | Freq: Two times a day (BID) | ORAL | Status: DC
  Administered 2022-04-03 – 2022-04-04 (×3): 25 mg
  Filled 2022-04-03 (×3): qty 1

## 2022-04-03 NOTE — Progress Notes (Signed)
NAME:  Melissa Leon, MRN:  [id number], DOB:  1945-04-13, LOS: 4 ADMISSION DATE:  03/29/2022, CONSULTATION DATE:  03/31/2022 REFERRING MD:  Dr. 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   Pt initially was seen in 12/2021 for E. Coli UTI with severe hydronephrosis on the right secondary to ureteral obstruction requiring percutaneous nephrostomy tube placement. She underwent ureteral stent placement in 02/2022 after multiple rounds of antibiotics. Later in 02/2022 she was found to have a lumbar epidural abscess which was operated on and grew MRSA. TTE at that time without vegetations noted. She was discharged with a RUE PICC line on IV daptomycin started 03/06/2022 with planned 8 weeks of therapy to end on 04/12/2022. Following discharge she was admitted again with pneumonia on 03/19/2022. Daptomycin was held during that admission and restarted on discharge 03/25/2022. She was again admitted on 04/14/2022 with generalized weakness, hypothermia, cough, and dyspnea. She was found to have worsening infiltrates bilaterally on CXR and CT. With concerns for daptomycin induced eosinophilic pneumonia she was switched to ceftriaxone and vancomycin.   She has had progressive respiratory decline and is now on maximal support with HFNC 40L at 100% FiO2. She continues to have increased work of breathing, tachypnea, and diaphragmatic paradoxus while maintaining O2 sats of 96. Blood cultures are preliminarily negative. She is having worsening back pain similar to her prior lumbar abscess. She does not have other acute complaints.   Pertinent  Medical History   T2DM HTN HFpEF Spinal Stenosis Chronic IV antibiotic therapy with  daptomycin after MRSA+ lumbar spinal abscess R Ureteral obstruction with resultant hydronephrosis and pyelonephritis s/p JJ stent  Significant Hospital Events: Including procedures, antibiotic start and stop dates in addition to other pertinent events   3/11 admitted with sepsis and worsening multifocal pneumonia on chronic daptomycin, changed to ceftriaxone and vancomycin 3/12 transfer to MICU for intubation 2/2 progressive respiratory distress on HFNC 40L 100% FiO2  Interim History / Subjective:   Failed SBT's yesterday.  No acute events overnight  Objective   Blood pressure (!) 121/56, pulse 67, temperature (!) 96.8 F (36 C), temperature source Axillary, resp. rate 20, height 5\' 7"  (1.702 m), weight 77.9 kg, SpO2 100 %.    Vent Mode: PRVC FiO2 (%):  [40 %-100 %] 50 % Set Rate:  [16 bmp-24 bmp] 16 bmp Vt Set:  [490 mL] 490 mL PEEP:  [5 cmH20-8 cmH20] 8 cmH20 Pressure Support:  [5 cmH20] 5 cmH20 Plateau Pressure:  [16 cmH20-27 cmH20] 16 cmH20   Intake/Output Summary (Last 24 hours) at 04/03/2022 0844 Last data filed at 04/03/2022 0600 Gross per 24 hour  Intake 2463.82 ml  Output 3250 ml  Net -786.18 ml   Filed Weights   04/01/22 0330 04/02/22 0705 04/03/22 0500  Weight: 76.5 kg 77.9 kg 77.9 kg    Examination:   Gen:      No acute distress HEENT:  EOMI, sclera anicteric Neck:     No masses; no thyromegaly, ET tube Lungs:    Clear to auscultation bilaterally; normal respiratory effort CV:         Regular rate and rhythm; no murmurs Abd:      + bowel sounds; soft, non-tender; no palpable masses, no distension Ext:    No edema; adequate peripheral  perfusion Skin:      Warm and dry; no rash Neuro: Sedated  Labs/imaging reviewed Significant for sodium 133, BUN/creatinine 53/0.71 WBC 17.9, hemoglobin 7.6, platelets 379 No new imaging  Resolved Hospital Problem list     Assessment & Plan:  Acute hypoxemic respiratory failure Concern for eosinophilic pneumonia 2/2  daptomycin vs progressive multifocal pneumonia Eosinophils 17% on BAL cell count. Lower than I would expect for true eosinophilic process.  RVP negative Follow BAL labs to completion Ceftriaxone stopped 3/14 per infectious disease.  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 Asymptomatic  Monitor urine output and creatinine  HFpEF CAD HTN -stable, will monitor fluid status -strict ins and outs -holding antihypertensives with low pressures -aspirin 81 mg daily -PRN hydralazine for SPB>160 - lasix as above  T2DM -Semglee 10 units - 6 units TF coverage - SSI  Anemia: No obvious bleeding source Follow CBC  Best Practice (right click and "Reselect all SmartList Selections" daily)   Diet/type: NPO TF DVT prophylaxis: LMWH, SCD GI prophylaxis: N/A Lines: N/A Foley:  N/A Code Status:  full code Last date of multidisciplinary goals of care discussion [3/12]  Critical care time:    The patient is critically ill with multiple organ system failure and requires high complexity decision making for assessment and support, frequent evaluation and titration of therapies, advanced monitoring, review of radiographic studies and interpretation of complex data.   Critical Care Time devoted to patient care services, exclusive of separately billable procedures, described in this note is 35 minutes.   [name] MD [facility] See Amion for pager  If no response to pager , please call [phone] until  7pm After 7:00 pm call Elink  [phone] 04/03/2022, 8:44 AM

## 2022-04-03 NOTE — Progress Notes (Signed)
PT Cancellation Note  Patient Details Name: Melissa Leon MRN: [id number] DOB: 1945/08/28   Cancelled Treatment:    Reason Eval/Treat Not Completed: Medical issues which prohibited therapy - pt remains intubated, pt with x4 medical cancels in a row. Per rehab policy, PT to sign off at this time, please reconsult when pt is medically ready for PT evaluation.  Stacie Glaze, PT DPT Acute Rehabilitation Services Pager [phone]  Office [phone]    Louis Matte 04/03/2022, 1:53 PM

## 2022-04-03 NOTE — Progress Notes (Signed)
Subjective:  Patient on ventilator   Antibiotics:  Anti-infectives (From admission, onward)    Start     Dose/Rate Route Frequency Ordered Stop   04/02/22 1630  vancomycin (VANCOCIN) IVPB 1000 mg/200 mL premix        1,000 mg 200 mL/hr over 60 Minutes Intravenous Every 24 hours 04/02/22 1541     03/31/22 1400  vancomycin (VANCOREADY) IVPB 1250 mg/250 mL  Status:  Discontinued        1,250 mg 166.7 mL/hr over 90 Minutes Intravenous Every 24 hours 04/09/2022 1347 04/02/22 1541   03/31/22 1000  cefTRIAXone (ROCEPHIN) 2 g in sodium chloride 0.9 % 100 mL IVPB  Status:  Discontinued        2 g 200 mL/hr over 30 Minutes Intravenous Every 24 hours 04/14/2022 1730 04/02/22 1503   04/02/2022 1400  vancomycin (VANCOREADY) IVPB 1500 mg/300 mL        1,500 mg 150 mL/hr over 120 Minutes Intravenous  Once 03/24/2022 1345 04/13/2022 1946   03/24/2022 1345  cefTRIAXone (ROCEPHIN) 2 g in sodium chloride 0.9 % 100 mL IVPB        2 g 200 mL/hr over 30 Minutes Intravenous  Once 03/28/2022 1330 04/13/2022 1456       Medications: Scheduled Meds:  aspirin  81 mg Per Tube QHS   Chlorhexidine Gluconate Cloth  6 each Topical Daily   docusate  100 mg Per Tube BID   enoxaparin (LOVENOX) injection  40 mg Subcutaneous Q24H   feeding supplement (PROSource TF20)  60 mL Per Tube Daily   furosemide  40 mg Intravenous Q12H   Gerhardt's butt cream   Topical BID   guaiFENesin  400 mg Per Tube Q6H   insulin aspart  0-20 Units Subcutaneous Q4H   insulin aspart  6 Units Subcutaneous Q4H   insulin glargine-yfgn  10 Units Subcutaneous Daily   methylPREDNISolone (SOLU-MEDROL) injection  40 mg Intravenous Q12H   mouth rinse  15 mL Mouth Rinse Q2H   pantoprazole (PROTONIX) IV  40 mg Intravenous QHS   polyethylene glycol  17 g Per Tube BID   QUEtiapine  25 mg Per Tube BID   senna-docusate  2 tablet Per Tube QHS   sodium chloride flush  10-40 mL Intracatheter Q12H   Continuous Infusions:  sodium chloride 10 mL/hr at  04/03/22 1100   dexmedetomidine (PRECEDEX) IV infusion 0.8 mcg/kg/hr (04/03/22 1311)   feeding supplement (VITAL 1.5 CAL) 50 mL/hr at 04/03/22 1100   fentaNYL infusion INTRAVENOUS 150 mcg/hr (04/03/22 1100)   vancomycin Stopped (04/02/22 1707)   PRN Meds:.sodium chloride, bisacodyl, fentaNYL, hydrALAZINE, ipratropium-albuterol, midazolam, ondansetron **OR** ondansetron (ZOFRAN) IV, mouth rinse, sodium chloride flush    Objective: Weight change:   Intake/Output Summary (Last 24 hours) at 04/03/2022 1417 Last data filed at 04/03/2022 1300 Gross per 24 hour  Intake 2789.94 ml  Output 3200 ml  Net -410.06 ml    Blood pressure 102/61, pulse 71, temperature (!) 97.2 F (36.2 C), temperature source Axillary, resp. rate (!) 26, height 5\' 7"  (1.702 m), weight 77.9 kg, SpO2 93 %. Temp:  [96.8 F (36 C)-98.7 F (37.1 C)] 97.2 F (36.2 C) (03/15 1136) Pulse Rate:  [55-81] 71 (03/15 1100) Resp:  [12-26] 26 (03/15 1211) BP: (102-139)/(55-69) 102/61 (03/15 1100) SpO2:  [90 %-100 %] 93 % (03/15 1211) FiO2 (%):  [40 %-50 %] 50 % (03/15 1211) Weight:  [77.9 kg] 77.9 kg (03/15 0500)  Physical Exam: Physical Exam  Constitutional:      Appearance: She is well-developed.     Interventions: She is intubated.  HENT:     Head: Normocephalic and atraumatic.     Right Ear: External ear normal.     Left Ear: External ear normal.     Mouth/Throat:     Pharynx: No oropharyngeal exudate.  Eyes:     General: No scleral icterus.    Conjunctiva/sclera: Conjunctivae normal.     Pupils: Pupils are equal, round, and reactive to light.  Cardiovascular:     Rate and Rhythm: Regular rhythm. Tachycardia present.  Pulmonary:     Effort: No respiratory distress. She is intubated.     Breath sounds: No wheezing.  Abdominal:     General: There is no distension.  Musculoskeletal:        General: No tenderness. Normal range of motion.  Lymphadenopathy:     Cervical: No cervical adenopathy.  Skin:     General: Skin is warm.     Coloration: Skin is pale.     Findings: No erythema or rash.  Neurological:     General: No focal deficit present.     Motor: No abnormal muscle tone.     Coordination: Coordination normal.      CBC:    BMET Recent Labs    04/02/22 1650 04/03/22 0514  NA 133* 133*  K 4.8 4.9  CL 104 103  CO2 22 24  GLUCOSE 381* 200*  BUN 49* 53*  CREATININE 0.85 0.71  CALCIUM 9.1 9.0      Liver Panel  Recent Labs    04/02/22 0524  PROT 5.8*  ALBUMIN 2.0*  AST 14*  ALT 21  ALKPHOS 104  BILITOT 0.3        Sedimentation Rate No results for input(s): "ESRSEDRATE" in the last 72 hours. C-Reactive Protein Recent Labs    04/01/22 1000  CRP 21.5*     Micro Results: Recent Results (from the past 720 hour(s))  Blood culture (routine x 2)     Status: None   Collection Time: 03/19/22  7:35 PM   Specimen: BLOOD RIGHT HAND  Result Value Ref Range Status   Specimen Description BLOOD RIGHT HAND  Final   Special Requests   Final    BOTTLES DRAWN AEROBIC ONLY Blood Culture adequate volume   Culture   Final    NO GROWTH 5 DAYS Performed at [facility], [address], [place], [place] [address]    Report Status 03/24/2022 FINAL  Final  Blood culture (routine x 2)     Status: None   Collection Time: 03/19/22  7:48 PM   Specimen: BLOOD RIGHT ARM  Result Value Ref Range Status   Specimen Description BLOOD RIGHT ARM  Final   Special Requests   Final    BOTTLES DRAWN AEROBIC AND ANAEROBIC Blood Culture adequate volume   Culture   Final    NO GROWTH 5 DAYS Performed at [facility], [place] [address], [place], [place] [address]    Report Status 03/24/2022 FINAL  Final  Resp panel by RT-PCR (RSV, Flu A&B, Covid) Anterior Nasal Swab     Status: None   Collection Time: 03/19/22 10:25 PM   Specimen: Anterior Nasal Swab  Result Value Ref Range Status   SARS Coronavirus 2 by RT PCR NEGATIVE NEGATIVE Final   Influenza A by PCR NEGATIVE  NEGATIVE Final   Influenza B by PCR NEGATIVE NEGATIVE Final  Comment: (NOTE) The Xpert Xpress SARS-CoV-2/FLU/RSV plus assay is intended as an aid in the diagnosis of influenza from Nasopharyngeal swab specimens and should not be used as a sole basis for treatment. Nasal washings and aspirates are unacceptable for Xpert Xpress SARS-CoV-2/FLU/RSV testing.  Fact Sheet for Patients: EntrepreneurPulse.com.au  Fact Sheet for Healthcare Providers: IncredibleEmployment.be  This test is not yet approved or cleared by the [place] FDA and has been authorized for detection and/or diagnosis of SARS-CoV-2 by FDA under an Emergency Use Authorization (EUA). This EUA will remain in effect (meaning this test can be used) for the duration of the COVID-19 declaration under Section 564(b)(1) of the Act, 21 U.S.C. section 360bbb-3(b)(1), unless the authorization is terminated or revoked.     Resp Syncytial Virus by PCR NEGATIVE NEGATIVE Final    Comment: (NOTE) Fact Sheet for Patients: EntrepreneurPulse.com.au  Fact Sheet for Healthcare Providers: IncredibleEmployment.be  This test is not yet approved or cleared by the [place] FDA and has been authorized for detection and/or diagnosis of SARS-CoV-2 by FDA under an Emergency Use Authorization (EUA). This EUA will remain in effect (meaning this test can be used) for the duration of the COVID-19 declaration under Section 564(b)(1) of the Act, 21 U.S.C. section 360bbb-3(b)(1), unless the authorization is terminated or revoked.  Performed at [facility], [place] [address], [place], [place] [address]   Urine Culture     Status: Abnormal   Collection Time: 03/20/22 12:45 AM   Specimen: Urine, Random  Result Value Ref Range Status   Specimen Description URINE, RANDOM  Final   Special Requests   Final    NONE Reflexed from [id number] Performed at [facility], [place] [address], [place], [place] [address]    Culture 10,000 COLONIES/mL YEAST (A)  Final   Report Status 03/21/2022 FINAL  Final  MRSA Next Gen by PCR, Nasal     Status: None   Collection Time: 03/20/22  8:15 AM   Specimen: Nasal Mucosa; Nasal Swab  Result Value Ref Range Status   MRSA by PCR Next Gen NOT DETECTED NOT DETECTED Final    Comment: (NOTE) The GeneXpert MRSA Assay (FDA approved for NASAL specimens only), is one component of a comprehensive MRSA colonization surveillance program. It is not intended to diagnose MRSA infection nor to guide or monitor treatment for MRSA infections. Test performance is not FDA approved in patients less than 69 years old. Performed at [facility], [place] [address] [place], [place] [address]   Respiratory (~20 pathogens) panel by PCR     Status: None   Collection Time: 03/20/22  8:16 AM   Specimen: Nasopharyngeal Swab; Respiratory  Result Value Ref Range Status   Adenovirus NOT DETECTED NOT DETECTED Final   Coronavirus 229E NOT DETECTED NOT DETECTED Final    Comment: (NOTE) The Coronavirus on the Respiratory Panel, DOES NOT test for the novel  Coronavirus (2019 nCoV)    Coronavirus HKU1 NOT DETECTED NOT DETECTED Final   Coronavirus NL63 NOT DETECTED NOT DETECTED Final   Coronavirus OC43 NOT DETECTED NOT DETECTED Final   Metapneumovirus NOT DETECTED NOT DETECTED Final   Rhinovirus / Enterovirus NOT DETECTED NOT DETECTED Final   Influenza A NOT DETECTED NOT DETECTED Final   Influenza B NOT DETECTED NOT DETECTED Final   Parainfluenza Virus 1 NOT DETECTED NOT DETECTED Final   Parainfluenza Virus 2 NOT DETECTED NOT DETECTED Final   Parainfluenza Virus 3 NOT DETECTED NOT DETECTED Final  Parainfluenza Virus 4 NOT DETECTED NOT DETECTED Final   Respiratory Syncytial Virus NOT DETECTED NOT DETECTED Final   Bordetella pertussis NOT DETECTED NOT DETECTED Final   Bordetella Parapertussis NOT DETECTED NOT DETECTED Final    Chlamydophila pneumoniae NOT DETECTED NOT DETECTED Final   Mycoplasma pneumoniae NOT DETECTED NOT DETECTED Final    Comment: Performed at [facility], [place] [address] [place], [place] [address]  Culture, blood (routine x 2)     Status: None (Preliminary result)   Collection Time: 04/03/2022 12:18 PM   Specimen: BLOOD  Result Value Ref Range Status   Specimen Description BLOOD LEFT ANTECUBITAL  Final   Special Requests   Final    BOTTLES DRAWN AEROBIC AND ANAEROBIC Blood Culture adequate volume   Culture   Final    NO GROWTH 4 DAYS Performed at [facility], [place] [address], [place], [place] [address]    Report Status PENDING  Incomplete  Culture, blood (routine x 2)     Status: None (Preliminary result)   Collection Time: 04/19/2022  7:42 PM   Specimen: BLOOD  Result Value Ref Range Status   Specimen Description BLOOD LEFT ANTECUBITAL  Final   Special Requests   Final    BOTTLES DRAWN AEROBIC AND ANAEROBIC Blood Culture results may not be optimal due to an inadequate volume of blood received in culture bottles   Culture   Final    NO GROWTH 4 DAYS Performed at [facility], [place] [address], [place], [place] [address]    Report Status PENDING  Incomplete  Culture, Respiratory w Gram Stain     Status: None   Collection Time: 03/31/22 10:13 AM   Specimen: Bronchoalveolar Lavage; Respiratory  Result Value Ref Range Status   Specimen Description BRONCHIAL ALVEOLAR LAVAGE  Final   Special Requests NONE  Final   Gram Stain   Final    FEW WBC PRESENT, PREDOMINANTLY PMN NO ORGANISMS SEEN    Culture   Final    NO GROWTH 2 DAYS Performed at [facility], [address], [place], [place] [address]    Report Status 04/02/2022 FINAL  Final  Fungus Culture With Stain     Status: None (Preliminary result)   Collection Time: 03/31/22 10:13 AM   Specimen: Bronchial Alveolar Lavage; Respiratory  Result Value Ref Range Status   Fungus Stain Final report  Final     Comment: (NOTE) Performed At: [facility] [place], [place] [id number] Rush Farmer MD [id number]    Fungus (Mycology) Culture PENDING  Incomplete   Fungal Source BRONCHIAL ALVEOLAR LAVAGE  Final    Comment: Performed at [facility], [place] [address], [place], [place] [address]  Respiratory (~20 pathogens) panel by PCR     Status: None   Collection Time: 03/31/22 10:13 AM   Specimen: Bronchoalveolar Lavage; Respiratory  Result Value Ref Range Status   Adenovirus NOT DETECTED NOT DETECTED Final   Coronavirus 229E NOT DETECTED NOT DETECTED Final    Comment: (NOTE) The Coronavirus on the Respiratory Panel, DOES NOT test for the novel  Coronavirus (2019 nCoV)    Coronavirus HKU1 NOT DETECTED NOT DETECTED Final   Coronavirus NL63 NOT DETECTED NOT DETECTED Final   Coronavirus OC43 NOT DETECTED NOT DETECTED Final   Metapneumovirus NOT DETECTED NOT DETECTED Final   Rhinovirus / Enterovirus NOT DETECTED NOT DETECTED Final   Influenza A NOT DETECTED NOT DETECTED Final   Influenza B  NOT DETECTED NOT DETECTED Final   Parainfluenza Virus 1 NOT DETECTED NOT DETECTED Final   Parainfluenza Virus 2 NOT DETECTED NOT DETECTED Final   Parainfluenza Virus 3 NOT DETECTED NOT DETECTED Final   Parainfluenza Virus 4 NOT DETECTED NOT DETECTED Final   Respiratory Syncytial Virus NOT DETECTED NOT DETECTED Final   Bordetella pertussis NOT DETECTED NOT DETECTED Final   Bordetella Parapertussis NOT DETECTED NOT DETECTED Final   Chlamydophila pneumoniae NOT DETECTED NOT DETECTED Final   Mycoplasma pneumoniae NOT DETECTED NOT DETECTED Final    Comment: Performed at [facility], [place] [address], [place], [place] [address]  Fungus Culture Result     Status: None   Collection Time: 03/31/22 10:13 AM  Result Value Ref Range Status   Result 1 Comment  Final    Comment: (NOTE) KOH/Calcofluor preparation:  no fungus observed. Performed At: [facility] [place], [place] [id number] Rush Farmer MD [id number]   MRSA Next Gen by PCR, Nasal     Status: None   Collection Time: 03/31/22 11:30 AM   Specimen: Nasal Mucosa; Nasal Swab  Result Value Ref Range Status   MRSA by PCR Next Gen NOT DETECTED NOT DETECTED Final    Comment: (NOTE) The GeneXpert MRSA Assay (FDA approved for NASAL specimens only), is one component of a comprehensive MRSA colonization surveillance program. It is not intended to diagnose MRSA infection nor to guide or monitor treatment for MRSA infections. Test performance is not FDA approved in patients less than 57 years old. Performed at [facility], [place] [address], [place], [place] [address]     Studies/Results: [place] CHEST PORT 1 VIEW  Result Date: 04/03/2022 CLINICAL DATA:  Acute respiratory failure. EXAM: PORTABLE CHEST 1 VIEW COMPARISON:  Radiographs 04/02/2022 and 03/31/2022.  CT 03/23/2022. FINDINGS: 0859 hours. The endotracheal tube, right arm PICC and enteric tube appear unchanged. The heart size and mediastinal contours are stable with mild aortic atherosclerosis. Diffuse right greater than left airspace opacities are again noted with partial clearing over the last 3 days. No evidence of pneumothorax or significant pleural effusion. The bones appear unchanged. Telemetry leads overlie the chest. IMPRESSION: Partial clearing of right greater than left airspace opacities over the last 3 days. Stable support system. Electronically Signed   By: Richardean Sale M.D.   On: 04/03/2022 10:02   DG CHEST PORT 1 VIEW  Result Date: 04/02/2022 CLINICAL DATA:  Respiratory failure. EXAM: PORTABLE CHEST 1 VIEW COMPARISON:  03/31/2022 FINDINGS: The endotracheal tube is 2.8 cm above the carina. The NG tube is coursing down the esophagus and into the stomach. The right PICC line is stable with its tip in the distal SVC. Persistent diffuse but asymmetric airspace process in the lungs. No large pleural effusions or  pneumothorax. IMPRESSION: 1. Stable support apparatus. 2. Persistent diffuse but asymmetric airspace process in the lungs. Electronically Signed   By: Marijo Sanes M.D.   On: 04/02/2022 11:56      Assessment/Plan:  INTERVAL HISTORY:   Patient had failed attempts at weaning yesterday.    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  #1 Eosinophilic pneumonia:  Would continue corticosteroids and supportive care.  ?  Steroid dose would need to be increased at all --would defer to Dr. Vaughan Browner and CCM  BAL cultures have been unrevealing and negative for bacterial pathogens.  RVP negative.  Fungal cultures incubating   #2 vertebral osteomyelitis discitis epidural abscess: MRI of the spine is encouraging.  There are some endplate edema L3  Continue vancomycin  Think as she improves and hopefully comes off the ventilator and out of the ICU we can look at a plan of transitioning her to oral antibiotics to complete treatment.  I spent 52 minutes with the patient including than 50% of the time in face to face and non-face-to-face time along with review of medical records in preparation for the visit and during the visit and in coordination of her care.   My partner Dr. Juleen China is available for questions this weekend  and I will be back on Monday.   LOS: 4 days   Alcide Evener 04/03/2022, 2:17 PM

## 2022-04-03 NOTE — Inpatient Diabetes Management (Signed)
Inpatient Diabetes Program Recommendations  AACE/ADA: New Consensus Statement on Inpatient Glycemic Control (2015)  Target Ranges:  Prepandial:   less than 140 mg/dL      Peak postprandial:   less than 180 mg/dL (1-2 hours)      Critically ill patients:  140 - 180 mg/dL   Lab Results  Component Value Date   GLUCAP 196 (H) 04/03/2022   HGBA1C 5.6 01/04/2022    Review of Glycemic Control  Latest Reference Range & Units 04/02/22 15:26 04/02/22 19:46 04/02/22 23:19 04/03/22 03:28 04/03/22 07:30  Glucose-Capillary 70 - 99 mg/dL 323 (H) 352 (H) 185 (H) 197 (H) 196 (H)  (H): Data is abnormally high Diabetes history: Type 2 DM Outpatient Diabetes medications: Amaryl 4 mg QD, Metformin 1000 mg BID Current orders for Inpatient glycemic control: Semglee 10 units QD, Novolog 6 units Q4H, Novolog 0-20 units Q4H Solumedrol 40 mg BID Vital @ 50 ml/hr  Inpatient Diabetes Program Recommendations:    Consider further increasing Novolog 10 units Q4H for tube feed coverage (to be stopped or held in the event tube feeds are stopped).  Thanks, Bronson Curb, MSN, RNC-OB Diabetes Coordinator [phone] (8a-5p)

## 2022-04-03 NOTE — Progress Notes (Signed)
SLP Cancellation Note  Patient Details Name: Melissa Leon MRN: [id number] DOB: 04/24/1945   Cancelled treatment:        Pt continues to be on vent. Will follow along. Please notify speech if extubated.    Houston Siren 04/03/2022, 7:33 AM

## 2022-04-04 ENCOUNTER — Other Ambulatory Visit: Payer: Self-pay

## 2022-04-04 ENCOUNTER — Inpatient Hospital Stay (HOSPITAL_COMMUNITY): Payer: Medicare HMO

## 2022-04-04 DIAGNOSIS — J8281 Chronic eosinophilic pneumonia: Secondary | ICD-10-CM | POA: Diagnosis not present

## 2022-04-04 DIAGNOSIS — G062 Extradural and subdural abscess, unspecified: Secondary | ICD-10-CM | POA: Diagnosis not present

## 2022-04-04 DIAGNOSIS — J189 Pneumonia, unspecified organism: Secondary | ICD-10-CM | POA: Diagnosis not present

## 2022-04-04 DIAGNOSIS — I5031 Acute diastolic (congestive) heart failure: Secondary | ICD-10-CM

## 2022-04-04 DIAGNOSIS — M7989 Other specified soft tissue disorders: Secondary | ICD-10-CM

## 2022-04-04 LAB — BASIC METABOLIC PANEL
Anion gap: 8 (ref 5–15)
Anion gap: 4 — ABNORMAL LOW (ref 5–15)
BUN: 55 mg/dL — ABNORMAL HIGH (ref 8–23)
BUN: 57 mg/dL — ABNORMAL HIGH (ref 8–23)
CO2: 27 mmol/L (ref 22–32)
CO2: 26 mmol/L (ref 22–32)
Calcium: 8.8 mg/dL — ABNORMAL LOW (ref 8.9–10.3)
Calcium: 7.6 mg/dL — ABNORMAL LOW (ref 8.9–10.3)
Chloride: 98 mmol/L (ref 98–111)
Chloride: 101 mmol/L (ref 98–111)
Creatinine, Ser: 0.79 mg/dL (ref 0.44–1.00)
Creatinine, Ser: 0.92 mg/dL (ref 0.44–1.00)
GFR, Estimated: >60 mL/min (ref >60)
GFR, Estimated: >60 mL/min (ref >60)
Glucose, Bld: 190 mg/dL — ABNORMAL HIGH (ref 70–99)
Glucose, Bld: 393 mg/dL — ABNORMAL HIGH (ref 70–99)
Potassium: 4.5 mmol/L (ref 3.5–5.1)
Potassium: 4.6 mmol/L (ref 3.5–5.1)
Sodium: 133 mmol/L — ABNORMAL LOW (ref 135–145)
Sodium: 131 mmol/L — ABNORMAL LOW (ref 135–145)

## 2022-04-04 LAB — CULTURE, BLOOD (ROUTINE X 2)
Culture: NO GROWTH
Culture: NO GROWTH
Special Requests: ADEQUATE

## 2022-04-04 LAB — CBC WITH DIFFERENTIAL/PLATELET
Abs Immature Granulocytes: 0.3 10*3/uL — ABNORMAL HIGH (ref 0.00–0.07)
Basophils Absolute: 0 10*3/uL (ref 0.0–0.1)
Basophils Relative: 0 %
Eosinophils Absolute: 0 10*3/uL (ref 0.0–0.5)
Eosinophils Relative: 0 %
HCT: 24.8 % — ABNORMAL LOW (ref 36.0–46.0)
Hemoglobin: 8.2 g/dL — ABNORMAL LOW (ref 12.0–15.0)
Immature Granulocytes: 2 %
Lymphocytes Relative: 4 %
Lymphs Abs: 0.6 10*3/uL — ABNORMAL LOW (ref 0.7–4.0)
MCH: 28.7 pg (ref 26.0–34.0)
MCHC: 33.1 g/dL (ref 30.0–36.0)
MCV: 86.7 fL (ref 80.0–100.0)
Monocytes Absolute: 0.4 10*3/uL (ref 0.1–1.0)
Monocytes Relative: 2 %
Neutro Abs: 15.4 10*3/uL — ABNORMAL HIGH (ref 1.7–7.7)
Neutrophils Relative %: 92 %
Platelets: 398 10*3/uL (ref 150–400)
RBC: 2.86 MIL/uL — ABNORMAL LOW (ref 3.87–5.11)
RDW: 15.6 % — ABNORMAL HIGH (ref 11.5–15.5)
WBC: 16.8 10*3/uL — ABNORMAL HIGH (ref 4.0–10.5)
nRBC: 0 % (ref 0.0–0.2)

## 2022-04-04 LAB — GLUCOSE, CAPILLARY
Glucose-Capillary: 147 mg/dL — ABNORMAL HIGH (ref 70–99)
Glucose-Capillary: 175 mg/dL — ABNORMAL HIGH (ref 70–99)
Glucose-Capillary: 183 mg/dL — ABNORMAL HIGH (ref 70–99)
Glucose-Capillary: 197 mg/dL — ABNORMAL HIGH (ref 70–99)
Glucose-Capillary: 353 mg/dL — ABNORMAL HIGH (ref 70–99)
Glucose-Capillary: 393 mg/dL — ABNORMAL HIGH (ref 70–99)

## 2022-04-04 LAB — ECHOCARDIOGRAM COMPLETE
AR max vel: 2.21 cm2
AV Area VTI: 2.04 cm2
AV Area mean vel: 2.07 cm2
AV Mean grad: 7 mmHg
AV Peak grad: 12.4 mmHg
Ao pk vel: 1.76 m/s
Area-P 1/2: 3.27 cm2
Height: 67 in
S' Lateral: 2.8 cm
Weight: 2768.98 oz

## 2022-04-04 LAB — HEPARIN LEVEL (UNFRACTIONATED): Heparin Unfractionated: 0.21 IU/mL — ABNORMAL LOW (ref 0.30–0.70)

## 2022-04-04 LAB — MAGNESIUM: Magnesium: 1.8 mg/dL (ref 1.7–2.4)

## 2022-04-04 LAB — PHOSPHORUS: Phosphorus: 3.3 mg/dL (ref 2.5–4.6)

## 2022-04-04 MED ORDER — INSULIN GLARGINE-YFGN 100 UNIT/ML ~~LOC~~ SOLN
5.0000 [IU] | Freq: Once | SUBCUTANEOUS | Status: AC
  Administered 2022-04-04: 5 [IU] via SUBCUTANEOUS
  Filled 2022-04-04: qty 0.05

## 2022-04-04 MED ORDER — INSULIN GLARGINE-YFGN 100 UNIT/ML ~~LOC~~ SOLN
15.0000 [IU] | Freq: Every day | SUBCUTANEOUS | Status: DC
  Administered 2022-04-05: 15 [IU] via SUBCUTANEOUS
  Filled 2022-04-04: qty 0.15

## 2022-04-04 MED ORDER — NOREPINEPHRINE 4 MG/250ML-% IV SOLN
2.0000 ug/min | INTRAVENOUS | Status: DC
  Administered 2022-04-04: 2 ug/min via INTRAVENOUS
  Administered 2022-04-05: 5 ug/min via INTRAVENOUS
  Filled 2022-04-04 (×2): qty 250

## 2022-04-04 MED ORDER — FUROSEMIDE 10 MG/ML IJ SOLN
40.0000 mg | Freq: Four times a day (QID) | INTRAMUSCULAR | Status: AC
  Administered 2022-04-04 – 2022-04-05 (×4): 40 mg via INTRAVENOUS
  Filled 2022-04-04 (×4): qty 4

## 2022-04-04 MED ORDER — FUROSEMIDE 10 MG/ML IJ SOLN: 40.0000 mg | Freq: Two times a day (BID) | INTRAMUSCULAR | Status: DC

## 2022-04-04 MED ORDER — FENTANYL CITRATE PF 50 MCG/ML IJ SOSY: 25.0000 ug | PREFILLED_SYRINGE | Freq: Once | INTRAMUSCULAR | Status: DC

## 2022-04-04 MED ORDER — QUETIAPINE FUMARATE 50 MG PO TABS
50.0000 mg | ORAL_TABLET | Freq: Two times a day (BID) | ORAL | Status: DC
  Administered 2022-04-04 – 2022-04-06 (×4): 50 mg
  Filled 2022-04-04 (×4): qty 1

## 2022-04-04 MED ORDER — SODIUM CHLORIDE 0.9 % IV SOLN: 250.0000 mL | INTRAVENOUS | Status: DC

## 2022-04-04 MED ORDER — HEPARIN BOLUS VIA INFUSION
1000.0000 [IU] | Freq: Once | INTRAVENOUS | Status: AC
  Administered 2022-04-04: 1000 [IU] via INTRAVENOUS
  Filled 2022-04-04: qty 1000

## 2022-04-04 MED ORDER — MAGNESIUM HYDROXIDE 400 MG/5ML PO SUSP
15.0000 mL | Freq: Every day | ORAL | Status: DC
  Administered 2022-04-04 – 2022-04-05 (×2): 15 mL
  Filled 2022-04-04 (×3): qty 30

## 2022-04-04 MED ORDER — METHYLPREDNISOLONE SODIUM SUCC 125 MG IJ SOLR: 80.0000 mg | Freq: Two times a day (BID) | INTRAMUSCULAR | Status: DC

## 2022-04-04 MED ORDER — PROPOFOL 1000 MG/100ML IV EMUL
25.0000 ug/kg/min | INTRAVENOUS | Status: DC
  Administered 2022-04-04 – 2022-04-06 (×20): 80 ug/kg/min via INTRAVENOUS
  Filled 2022-04-04 (×5): qty 100
  Filled 2022-04-04 (×2): qty 200
  Filled 2022-04-04 (×10): qty 100

## 2022-04-04 MED ORDER — HEPARIN (PORCINE) 25000 UT/250ML-% IV SOLN
900.0000 [IU]/h | INTRAVENOUS | Status: DC
  Administered 2022-04-04: 900 [IU]/h via INTRAVENOUS
  Filled 2022-04-04: qty 250

## 2022-04-04 MED ORDER — BISACODYL 10 MG RE SUPP
10.0000 mg | Freq: Every day | RECTAL | Status: DC | PRN
  Administered 2022-04-04 – 2022-04-05 (×2): 10 mg via RECTAL
  Filled 2022-04-04 (×2): qty 1

## 2022-04-04 MED ORDER — HEPARIN BOLUS VIA INFUSION
4600.0000 [IU] | Freq: Once | INTRAVENOUS | Status: AC
  Administered 2022-04-04: 4600 [IU] via INTRAVENOUS
  Filled 2022-04-04: qty 4600

## 2022-04-04 MED ORDER — FENTANYL 2500MCG IN NS 250ML (10MCG/ML) PREMIX INFUSION
25.0000 ug/h | INTRAVENOUS | Status: DC
  Administered 2022-04-04 – 2022-04-05 (×3): 250 ug/h via INTRAVENOUS
  Filled 2022-04-04 (×2): qty 250

## 2022-04-04 MED ORDER — CISATRACURIUM BESYLATE 20 MG/10ML IV SOLN: 0.1000 mg/kg | INTRAVENOUS | Status: DC | PRN

## 2022-04-04 MED ORDER — SODIUM CHLORIDE 0.9 % IV SOLN
2.0000 g | Freq: Three times a day (TID) | INTRAVENOUS | Status: DC
  Administered 2022-04-04 – 2022-04-05 (×4): 2 g via INTRAVENOUS
  Filled 2022-04-04 (×4): qty 12.5

## 2022-04-04 MED ORDER — BISACODYL 5 MG PO TBEC: 5.0000 mg | DELAYED_RELEASE_TABLET | Freq: Every day | ORAL | Status: DC

## 2022-04-04 MED ORDER — FENTANYL BOLUS VIA INFUSION: 25.0000 ug | INTRAVENOUS | Status: DC | PRN

## 2022-04-04 MED ORDER — PROPOFOL 1000 MG/100ML IV EMUL
5.0000 ug/kg/min | INTRAVENOUS | Status: DC
  Administered 2022-04-04: 5 ug/kg/min via INTRAVENOUS
  Filled 2022-04-04: qty 100

## 2022-04-04 MED ORDER — METHYLPREDNISOLONE SODIUM SUCC 125 MG IJ SOLR
125.0000 mg | Freq: Four times a day (QID) | INTRAMUSCULAR | Status: DC
  Administered 2022-04-04 – 2022-04-06 (×9): 125 mg via INTRAVENOUS
  Filled 2022-04-04 (×9): qty 2

## 2022-04-04 MED ORDER — ARTIFICIAL TEARS OPHTHALMIC OINT
1.0000 | TOPICAL_OINTMENT | Freq: Three times a day (TID) | OPHTHALMIC | Status: DC
  Administered 2022-04-04 – 2022-04-05 (×3): 1 via OPHTHALMIC
  Filled 2022-04-04: qty 3.5

## 2022-04-04 MED ORDER — MAGNESIUM SULFATE 2 GM/50ML IV SOLN
2.0000 g | Freq: Once | INTRAVENOUS | Status: AC
  Administered 2022-04-04: 2 g via INTRAVENOUS
  Filled 2022-04-04: qty 50

## 2022-04-04 MED ORDER — HEPARIN (PORCINE) 25000 UT/250ML-% IV SOLN
1250.0000 [IU]/h | INTRAVENOUS | Status: DC
  Administered 2022-04-05: 1100 [IU]/h via INTRAVENOUS
  Administered 2022-04-06: 1250 [IU]/h via INTRAVENOUS
  Filled 2022-04-04 (×3): qty 250

## 2022-04-04 MED ORDER — ROCURONIUM BROMIDE 10 MG/ML (PF) SYRINGE
70.0000 mg | PREFILLED_SYRINGE | INTRAVENOUS | Status: DC | PRN
  Administered 2022-04-04 (×2): 70 mg via INTRAVENOUS
  Filled 2022-04-04 (×2): qty 10

## 2022-04-04 NOTE — Progress Notes (Signed)
[facility] ADULT ICU REPLACEMENT PROTOCOL   The patient does apply for the [facility] Adult ICU Electrolyte Replacment Protocol based on the criteria listed below:   1.Exclusion criteria: TCTS, ECMO, Dialysis, and Myasthenia Gravis patients 2. Is GFR >/= 30 ml/min? Yes.    Patient's GFR today is >60 3. Is SCr </= 2? Yes.   Patient's SCr is 0.79 mg/dL 4. Did SCr increase >/= 0.5 in 24 hours? No. 5.Pt's weight >40kg  Yes.   6. Abnormal electrolyte(s): Magnesium 1.8  7. Electrolytes replaced per protocol 8.  Call MD STAT for K+ </= 2.5, Phos </= 1, or Mag </= 1 Physician:  Dr. Harlon Flor A Edu On 04/04/2022 6:20 AM

## 2022-04-04 NOTE — Progress Notes (Signed)
  ANTICOAGULATION CONSULT NOTE - Initial Consult  Pharmacy Consult for heparin Indication: DVT  Allergies  Allergen Reactions   Daptomycin Other (See Comments)    Concern for eosinophilic PNA   Choline Fenofibrate Other (See Comments)    ELEVATED LFT.    Penicillins Hives   Ozempic (0.25 Or 0.5 Mg-Dose) [Semaglutide(0.25 Or 0.5mg -Dos)] Nausea Only   Statins Other (See Comments)    Aches, pain    Patient Measurements: Height: 5\' 7"  (170.2 cm) Weight: 78.5 kg (173 lb 1 oz) IBW/kg (Calculated) : 61.6 Heparin Dosing Weight: 76kg   Vital Signs: Temp: 98.6 F (37 C) (03/16 1127) Temp Source: Axillary (03/16 1127) BP: 120/59 (03/16 1200) Pulse Rate: 70 (03/16 1200)  Labs: Recent Labs    04/02/22 0524 04/02/22 1650 04/03/22 0514 04/04/22 0436  HGB 7.8*  --  7.6* 8.2*  HCT 23.7*  --  24.3* 24.8*  PLT 411*  --  379 398  CREATININE 0.89 0.85 0.71 0.79    Estimated Creatinine Clearance: 64.6 mL/min (by C-G formula based on SCr of 0.79 mg/dL).   Medical History: Past Medical History:  Diagnosis Date   Anemia    Arthritis    Depression    Diabetes mellitus ([place])    Hypertension    Pneumonia    Spinal stenosis     Assessment: 77 yo F not on anticoagulants prior to admission found to have DVT of L soleal vein. Patient was previously receiving enoxaparin 40mg  SQ daily for DVT prophylaxis. In setting of new DVT, pharmacy consulted to dose heparin infusion  CBC: low stable  Goal of Therapy:  Heparin level 0.3-0.7 units/ml Monitor platelets by anticoagulation protocol: Yes   Plan:  D/c enoxaparin for DVT prophylaxis Initiate heparin infusion -heparin 4600 units IV x1 followed by heparin infusion at 900 units/hr -heparin level in 8hrs at 2100  -Daily HL, CBC F/u s/sx bleeding, LOT and transition to enteral therapies as appropriate  Wilson Singer, PharmD Clinical Pharmacist 04/04/2022 12:36 PM

## 2022-04-04 NOTE — Progress Notes (Signed)
[facility]  Date of Admission:  04/11/2022           Reason for visit: Follow up on MRSA infection  Current antibiotics: Vancomycin Cefepime   ASSESSMENT:    77 y.o. 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[place]) Active Problems:   Sepsis without acute organ dysfunction ([place])   Multifocal pneumonia   Acute respiratory failure with hypoxia (HCC)   Sepsis ([place])   Malnutrition of moderate degree   Pressure injury of skin   Chronic heart failure with preserved ejection fraction (HFpEF) (HCC)   Epidural abscess    MEDICATIONS:    Scheduled Meds:  aspirin  81 mg Per Tube QHS   Chlorhexidine Gluconate Cloth  6 each Topical Daily   docusate  100 mg Per Tube BID   enoxaparin (LOVENOX) injection  40 mg Subcutaneous Q24H   feeding supplement (PROSource TF20)  60 mL Per Tube Daily   furosemide  40 mg Intravenous Q6H   Gerhardt's butt cream   Topical BID   guaiFENesin  400 mg Per Tube Q6H   insulin aspart  0-20 Units Subcutaneous Q4H   insulin aspart  6 Units Subcutaneous Q4H   [START ON 04/05/2022] insulin glargine-yfgn  15 Units Subcutaneous Daily   insulin glargine-yfgn  5 Units Subcutaneous Once   magnesium hydroxide  15 mL Per Tube Daily   methylPREDNISolone (SOLU-MEDROL) injection  125 mg Intravenous Q6H   mouth rinse  15 mL Mouth Rinse Q2H   pantoprazole (PROTONIX) IV  40 mg Intravenous QHS   polyethylene glycol  17 g Per Tube BID   QUEtiapine  50 mg Per Tube  BID   senna-docusate  2 tablet Per Tube QHS   sodium chloride flush  10-40 mL Intracatheter Q12H   Continuous Infusions:  sodium chloride 10 mL/hr at 04/04/22 1000   ceFEPime (MAXIPIME) IV     dexmedetomidine (PRECEDEX) IV infusion 1.2 mcg/kg/hr (04/04/22 1000)   feeding supplement (VITAL 1.5 CAL) 50 mL/hr at 04/04/22 1000   fentaNYL infusion INTRAVENOUS 200 mcg/hr (04/04/22 1115)   propofol (DIPRIVAN) infusion 5 mcg/kg/min (04/04/22 1118)   vancomycin Stopped (04/03/22 1713)   PRN Meds:.sodium chloride, bisacodyl, fentaNYL, hydrALAZINE, ipratropium-albuterol, midazolam, ondansetron **OR** ondansetron (ZOFRAN) IV, mouth rinse, sodium chloride flush  SUBJECTIVE:   24 hour events:  Patient remains on the ventilator Chest x-ray with persistent bilateral infiltrates CCM has started patient on cefepime with plan to escalate care for the next few days to see if there is any improvement prior to considering withdrawal of care Repeat respiratory culture pending Prior BAL cultures unrevealing     Review of Systems  Unable to perform ROS: Intubated      OBJECTIVE:   Blood pressure 131/60, pulse 68, temperature 98.6 F (37 C), temperature source Axillary, resp. rate 16, height 5\' 7"  (1.702 m), weight 78.5 kg, SpO2 97 %. Body mass index is 27.11 kg/m.  Physical Exam Constitutional:      Comments: She is ill appearing and on the ventilator.  2 family members are at the  bedside.   HENT:     Head:     Comments: ET Tube in place. Eyes:     Extraocular Movements: Extraocular movements intact.     Conjunctiva/sclera: Conjunctivae normal.  Pulmonary:     Comments: Intubated and sedated.  Musculoskeletal:     Cervical back: Normal range of motion and neck supple.     Right lower leg: No edema.     Left lower leg: No edema.     Comments: PICC line in place.   Skin:    General: Skin is warm and dry.      Lab Results: Lab Results  Component Value Date   WBC 16.8 (H)  04/04/2022   HGB 8.2 (L) 04/04/2022   HCT 24.8 (L) 04/04/2022   MCV 86.7 04/04/2022   PLT 398 04/04/2022    Lab Results  Component Value Date   NA 133 (L) 04/04/2022   K 4.5 04/04/2022   CO2 27 04/04/2022   GLUCOSE 190 (H) 04/04/2022   BUN 55 (H) 04/04/2022   CREATININE 0.79 04/04/2022   CALCIUM 8.8 (L) 04/04/2022   GFRNONAA >60 04/04/2022    Lab Results  Component Value Date   ALT 21 04/02/2022   AST 14 (L) 04/02/2022   ALKPHOS 104 04/02/2022   BILITOT 0.3 04/02/2022       Component Value Date/Time   CRP 21.5 (H) 04/01/2022 1000       Component Value Date/Time   ESRSEDRATE 124 (H) 02/28/2022 1732     I have reviewed the micro and lab results in Epic.  Imaging: DG CHEST PORT 1 VIEW  Result Date: 04/03/2022 CLINICAL DATA:  Acute respiratory failure. EXAM: PORTABLE CHEST 1 VIEW COMPARISON:  Radiographs 04/02/2022 and 03/31/2022.  CT 04/03/2022. FINDINGS: 0859 hours. The endotracheal tube, right arm PICC and enteric tube appear unchanged. The heart size and mediastinal contours are stable with mild aortic atherosclerosis. Diffuse right greater than left airspace opacities are again noted with partial clearing over the last 3 days. No evidence of pneumothorax or significant pleural effusion. The bones appear unchanged. Telemetry leads overlie the chest. IMPRESSION: Partial clearing of right greater than left airspace opacities over the last 3 days. Stable support system. Electronically Signed   By: Richardean Sale M.D.   On: 04/03/2022 10:02     Imaging independently reviewed in Epic.    [facility] [phone] pager 04/04/2022, 11:35 AM

## 2022-04-04 NOTE — Progress Notes (Signed)
NAME:  Melissa Leon, MRN:  [id number], DOB:  Feb 19, 1945, LOS: 5 ADMISSION DATE:  04/18/2022, CONSULTATION DATE:  03/31/2022 REFERRING MD:  Dr. 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   Pt initially was seen in 12/2021 for E. Coli UTI with severe hydronephrosis on the right secondary to ureteral obstruction requiring percutaneous nephrostomy tube placement. She underwent ureteral stent placement in 02/2022 after multiple rounds of antibiotics. Later in 02/2022 she was found to have a lumbar epidural abscess which was operated on and grew MRSA. TTE at that time without vegetations noted. She was discharged with a RUE PICC line on IV daptomycin started 03/06/2022 with planned 8 weeks of therapy to end on 04/12/2022. Following discharge she was admitted again with pneumonia on 03/19/2022. Daptomycin was held during that admission and restarted on discharge 03/25/2022. She was again admitted on 03/29/2022 with generalized weakness, hypothermia, cough, and dyspnea. She was found to have worsening infiltrates bilaterally on CXR and CT. With concerns for daptomycin induced eosinophilic pneumonia she was switched to ceftriaxone and vancomycin.   She has had progressive respiratory decline and is now on maximal support with HFNC 40L at 100% FiO2. She continues to have increased work of breathing, tachypnea, and diaphragmatic paradoxus while maintaining O2 sats of 96. Blood cultures are preliminarily negative. She is having worsening back pain similar to her prior lumbar abscess. She does not have other acute complaints.   Pertinent  Medical History  T2DM HTN HFpEF Spinal Stenosis Chronic IV antibiotic therapy with  daptomycin after MRSA+ lumbar spinal abscess R Ureteral obstruction with resultant hydronephrosis and pyelonephritis s/p JJ stent  Significant Hospital Events: Including procedures, antibiotic start and stop dates in addition to other pertinent events   3/11 admitted with sepsis and worsening multifocal pneumonia on chronic daptomycin, changed to ceftriaxone and vancomycin 3/12 transfer to MICU for intubation 2/2 progressive respiratory distress on HFNC 40L 100% FiO2 3/16 Remains on vent with FIO2 40% and 8 of peep, appears uncomfortable on vent despite fentanyl and precedex drip   Interim History / Subjective:  Unable to follow commands this am  3L positive  Remains on vent  Family updated at bedside   Objective   Blood pressure 136/60, pulse (!) 102, temperature 98.4 F (36.9 C), temperature source Axillary, resp. rate (!) 24, height 5\' 7"  (1.702 m), weight 78.5 kg, SpO2 91 %.    Vent Mode: PRVC FiO2 (%):  [50 %] 50 % Set Rate:  [16 bmp] 16 bmp Vt Set:  [490 mL] 490 mL PEEP:  [8 cmH20] 8 cmH20 Plateau Pressure:  [18 cmH20-24 cmH20] 24 cmH20   Intake/Output Summary (Last 24 hours) at 04/04/2022 0809 Last data filed at 04/04/2022 0700 Gross per 24 hour  Intake 2738.18 ml  Output 2375 ml  Net 363.18 ml    Filed Weights   04/02/22 0705 04/03/22 0500 04/04/22 0358  Weight: 77.9 kg 77.9 kg 78.5 kg    Examination:   General: Acute on chronically ill appearing elderly female lying in bed on mechanical ventilation, in NAD HEENT: ETT, MM pink/moist, PERRL,  Neuro: Eyes spontaneously open, unable to follow commands, no increased work of breathing, no added breath sounds  CV: s1s2 regular rate and rhythm, no murmur, rubs, or gallops,  PULM:  Diffuse bilateral rhonchi, increased work of breathing on vent with accessory muscle use seen  GI: soft, bowel sounds active in all 4 quadrants, non-tender, non-distended, tolerating TF Extremities: warm/dry, no edema  Skin: no rashes or  lesions  Resolved Hospital Problem list     Assessment & Plan:  Acute hypoxemic respiratory failure 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 Lower than I would expect for true eosinophilic process.  -RVP negative -Ceftriaxone stopped 3/14 per infectious disease.   P: Continue ventilator support with lung protective strategies  Unable to wean given current vent requirements of FIO2 50% Wean PEEP and FiO2 for sats greater than 90%. Head of bed elevated 30 degrees. Plateau pressures less than 30 cm H20.  Follow intermittent chest x-ray and ABG.   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 Asymptomatic  P: Continue to monitor urine function and output   HFpEF -ECHO 03/03/22 with EF 65-70% and grade I diastolic dysfunction  CAD HTN P: Continuous telemetry  ASA  Strict intake and output  Daily weight to assess volume status Daily assessment for need to diurese  Closely monitor renal function and electrolytes  Home antihypertensives on hold   T2DM P: Continue SSI, TF coverage and long acting insulin  CBG goal 140-180 CBG checks q4hrs  Anemia -No obvious bleeding source P: Follow CBC   Protein calorie malnutrition  Hypoalbuminemia  P: Continue TF coverage  Supplement protein   Physical deconditioning  -Patient has suffered multiple infections over the last several months with multiple admissions.  At discharge 03/25/22 PT recommended home health PT due to physical deconditioning. She is again critically ill on life support and will likely have a further decline with deconditioning P: PT/OT efforts when able  Protein supplementation as above    Best Practice (right click and "Reselect all SmartList Selections" daily)   Diet/type: NPO TF DVT prophylaxis: LMWH, SCD GI prophylaxis: N/A Lines: N/A Foley:  N/A Code Status:  full code Last date of multidisciplinary goals of care discussion: Update son at bedside with request made for family meeting per family. Son is to coordinate with his sister timing for meeting  Critical care time:   CRITICAL CARE Performed by: Cashel Bellina D. Harris  Total critical care time: 40 minutes  Critical care time was exclusive of separately billable procedures and treating other patients.  Critical care was necessary to treat or prevent imminent or life-threatening deterioration.  Critical care was time spent personally by me on the following activities: development of treatment plan with patient and/or surrogate as well as nursing, discussions with consultants, evaluation of patient's response to treatment, examination of patient, obtaining history from patient or surrogate, ordering and performing treatments and interventions, ordering and review of laboratory studies, ordering and review of radiographic studies, pulse oximetry and re-evaluation of patient's condition.  Kierston Plasencia D. Kenton Kingfisher, NP-C [facility] Personal contact information can be found on Amion  If no contact or response made please call  [phone] 04/04/2022, 8:20 AM

## 2022-04-04 NOTE — Progress Notes (Signed)
SLP Cancellation Note  Patient Details Name: Melissa Leon MRN: [id number] DOB: 1945/05/22   Cancelled treatment:       Reason Eval/Treat Not Completed: Medical issues which prohibited therapy;Other (comment) remains intubated; will continue to follow for readiness   Hayden Rasmussen. MA, [place]   04/04/2022, 8:07 AM

## 2022-04-04 NOTE — Progress Notes (Signed)
Peripherally Inserted Central Catheter Placement  The IV Nurse has discussed with the patient and/or persons authorized to consent for the patient, the purpose of this procedure and the potential benefits and risks involved with this procedure.  The benefits include less needle sticks, lab draws from the catheter, and the patient may be discharged home with the catheter. Risks include, but not limited to, infection, bleeding, blood clot (thrombus formation), and puncture of an artery; nerve damage and irregular heartbeat and possibility to perform a PICC exchange if needed/ordered by physician.  Alternatives to this procedure were also discussed.  Bard Power PICC patient education guide, fact sheet on infection prevention and patient information card has been provided to patient /or left at bedside.  Son at bedside gave consent.  PICC Placement Documentation  PICC Triple Lumen 04/04/22 Right Brachial 40 cm 0 cm (Active)  Indication for Insertion or Continuance of Line Vasoactive infusions 04/04/22 1643  Exposed Catheter (cm) 0 cm 04/04/22 1643  Site Assessment Clean, Dry, Intact 04/04/22 1643  Lumen #1 Status Saline locked;Flushed;Blood return noted 04/04/22 1643  Lumen #2 Status Saline locked;Flushed;Blood return noted 04/04/22 1643  Lumen #3 Status Saline locked;Flushed;Blood return noted 04/04/22 1643  Dressing Type Transparent;Securing device 04/04/22 1643  Dressing Status Clean, Dry, Intact;Antimicrobial disc in place 04/04/22 1643  Safety Lock Not Applicable [id number]  Line Care Connections checked and tightened 04/04/22 1643  Line Adjustment (NICU/IV Team Only) No 04/04/22 1643  Dressing Intervention New dressing 04/04/22 1643  Dressing Change Due 04/11/22 04/04/22 1643       Melissa Leon 04/04/2022, 4:48 PM

## 2022-04-04 NOTE — Progress Notes (Signed)
Bilateral lower extremity venous study completed.   Preliminary results relayed to Janett Billow, RN.  Please see CV Procedures for preliminary results.  Harlon Kutner, RVT  12:04 PM 04/04/22

## 2022-04-04 NOTE — Progress Notes (Signed)
  Echocardiogram 2D Echocardiogram has been performed.  Ronny Flurry 04/04/2022, 3:31 PM

## 2022-04-04 NOTE — Progress Notes (Signed)
[place] for heparin Indication: DVT  Allergies  Allergen Reactions   Daptomycin Other (See Comments)    Concern for eosinophilic PNA   Choline Fenofibrate Other (See Comments)    ELEVATED LFT.    Penicillins Hives   Ozempic (0.25 Or 0.5 Mg-Dose) [Semaglutide(0.25 Or 0.5mg -Dos)] Nausea Only   Statins Other (See Comments)    Aches, pain    Patient Measurements: Height: 5\' 7"  (170.2 cm) Weight: 78.5 kg (173 lb 1 oz) IBW/kg (Calculated) : 61.6 Heparin Dosing Weight: 76kg   Vital Signs: Temp: 97.7 F (36.5 C) (03/16 1951) Temp Source: Axillary (03/16 1951) BP: 127/44 (03/16 2100) Pulse Rate: 94 (03/16 2100)  Labs: Recent Labs    04/02/22 0524 04/02/22 1650 04/03/22 0514 04/04/22 0436 04/04/22 1957  HGB 7.8*  --  7.6* 8.2*  --   HCT 23.7*  --  24.3* 24.8*  --   PLT 411*  --  379 398  --   HEPARINUNFRC  --   --   --   --  0.21*  CREATININE 0.89   < > 0.71 0.79 0.92   < > = values in this interval not displayed.     Estimated Creatinine Clearance: 56.2 mL/min (by C-G formula based on SCr of 0.92 mg/dL).   Medical History: Past Medical History:  Diagnosis Date   Anemia    Arthritis    Depression    Diabetes mellitus ([place])    Hypertension    Pneumonia    Spinal stenosis     Assessment: 77 yo F not on anticoagulants prior to admission found to have DVT of L soleal vein. Pharmacy dosing IV heparin -heparin level= 0.21 on 900 units/hr  Goal of Therapy:  Heparin level 0.3-0.7 units/ml Monitor platelets by anticoagulation protocol: Yes   Plan:  -heparin bolus 1000 units then increase infusion to 1100 units/hr -Heparin level in 8 hours and daily wth CBC daily  Hildred Laser, PharmD Clinical Pharmacist **Pharmacist phone directory can now be found on [email or web address] ([email or web address]).  Listed under [place].

## 2022-04-05 ENCOUNTER — Inpatient Hospital Stay (HOSPITAL_COMMUNITY): Payer: Medicare HMO

## 2022-04-05 DIAGNOSIS — J8281 Chronic eosinophilic pneumonia: Secondary | ICD-10-CM | POA: Diagnosis not present

## 2022-04-05 LAB — GLUCOSE, CAPILLARY
Glucose-Capillary: 361 mg/dL — ABNORMAL HIGH (ref 70–99)
Glucose-Capillary: 318 mg/dL — ABNORMAL HIGH (ref 70–99)
Glucose-Capillary: 289 mg/dL — ABNORMAL HIGH (ref 70–99)
Glucose-Capillary: 360 mg/dL — ABNORMAL HIGH (ref 70–99)
Glucose-Capillary: 270 mg/dL — ABNORMAL HIGH (ref 70–99)
Glucose-Capillary: 228 mg/dL — ABNORMAL HIGH (ref 70–99)
Glucose-Capillary: 245 mg/dL — ABNORMAL HIGH (ref 70–99)
Glucose-Capillary: 207 mg/dL — ABNORMAL HIGH (ref 70–99)
Glucose-Capillary: 205 mg/dL — ABNORMAL HIGH (ref 70–99)
Glucose-Capillary: 187 mg/dL — ABNORMAL HIGH (ref 70–99)

## 2022-04-05 LAB — CBC
HCT: 24.6 % — ABNORMAL LOW (ref 36.0–46.0)
Hemoglobin: 7.5 g/dL — ABNORMAL LOW (ref 12.0–15.0)
MCH: 29 pg (ref 26.0–34.0)
MCHC: 30.5 g/dL (ref 30.0–36.0)
MCV: 95 fL (ref 80.0–100.0)
Platelets: 399 10*3/uL (ref 150–400)
RBC: 2.59 MIL/uL — ABNORMAL LOW (ref 3.87–5.11)
RDW: 15.4 % (ref 11.5–15.5)
WBC: 28.6 10*3/uL — ABNORMAL HIGH (ref 4.0–10.5)
nRBC: 0.1 % (ref 0.0–0.2)

## 2022-04-05 LAB — BASIC METABOLIC PANEL
Anion gap: 10 (ref 5–15)
BUN: 66 mg/dL — ABNORMAL HIGH (ref 8–23)
CO2: 25 mmol/L (ref 22–32)
Calcium: 8 mg/dL — ABNORMAL LOW (ref 8.9–10.3)
Chloride: 97 mmol/L — ABNORMAL LOW (ref 98–111)
Creatinine, Ser: 1.09 mg/dL — ABNORMAL HIGH (ref 0.44–1.00)
GFR, Estimated: 53 mL/min — ABNORMAL LOW (ref >60)
Glucose, Bld: 366 mg/dL — ABNORMAL HIGH (ref 70–99)
Potassium: 4.5 mmol/L (ref 3.5–5.1)
Sodium: 132 mmol/L — ABNORMAL LOW (ref 135–145)

## 2022-04-05 LAB — APTT: aPTT: 88 seconds — ABNORMAL HIGH (ref 24–36)

## 2022-04-05 LAB — ACID FAST SMEAR (AFB, MYCOBACTERIA): Acid Fast Smear: NEGATIVE

## 2022-04-05 LAB — HEPARIN LEVEL (UNFRACTIONATED)
Heparin Unfractionated: 0.53 IU/mL (ref 0.30–0.70)
Heparin Unfractionated: 0.47 IU/mL (ref 0.30–0.70)

## 2022-04-05 LAB — TRIGLYCERIDES: Triglycerides: 402 mg/dL — ABNORMAL HIGH (ref <150)

## 2022-04-05 LAB — MAGNESIUM: Magnesium: 2.3 mg/dL (ref 1.7–2.4)

## 2022-04-05 LAB — VANCOMYCIN, TROUGH: Vancomycin Tr: 19 ug/mL (ref 15–20)

## 2022-04-05 LAB — TROPONIN I (HIGH SENSITIVITY)
Troponin I (High Sensitivity): 635 ng/L (ref <18)
Troponin I (High Sensitivity): 757 ng/L (ref <18)

## 2022-04-05 LAB — PHOSPHORUS: Phosphorus: 6.3 mg/dL — ABNORMAL HIGH (ref 2.5–4.6)

## 2022-04-05 MED ORDER — FENTANYL CITRATE PF 50 MCG/ML IJ SOSY
100.0000 ug | PREFILLED_SYRINGE | Freq: Once | INTRAMUSCULAR | Status: AC
  Administered 2022-04-05: 100 ug via INTRAVENOUS

## 2022-04-05 MED ORDER — FENTANYL CITRATE (PF) 2500 MCG/50ML IJ SOLN
25.0000 ug/h | Status: DC
  Administered 2022-04-05 – 2022-04-06 (×3): 250 ug/h via INTRAVENOUS
  Filled 2022-04-05 (×2): qty 100

## 2022-04-05 MED ORDER — NOREPINEPHRINE 16 MG/250ML-% IV SOLN
0.0000 ug/min | INTRAVENOUS | Status: DC
  Administered 2022-04-05: 9 ug/min via INTRAVENOUS
  Administered 2022-04-06: 24 ug/min via INTRAVENOUS
  Filled 2022-04-05 (×2): qty 250

## 2022-04-05 MED ORDER — INSULIN ASPART 100 UNIT/ML IJ SOLN
10.0000 [IU] | INTRAMUSCULAR | Status: DC
  Administered 2022-04-05 (×2): 10 [IU] via SUBCUTANEOUS

## 2022-04-05 MED ORDER — SODIUM CHLORIDE 0.9 % IV SOLN
2.0000 g | Freq: Two times a day (BID) | INTRAVENOUS | Status: DC
  Administered 2022-04-05: 2 g via INTRAVENOUS
  Filled 2022-04-05: qty 12.5

## 2022-04-05 MED ORDER — DEXTROSE IN LACTATED RINGERS 5 % IV SOLN: INTRAVENOUS | Status: DC

## 2022-04-05 MED ORDER — DEXTROSE 50 % IV SOLN: 0.0000 mL | INTRAVENOUS | Status: DC | PRN

## 2022-04-05 MED ORDER — INSULIN REGULAR(HUMAN) IN NACL 100-0.9 UT/100ML-% IV SOLN
INTRAVENOUS | Status: DC
  Administered 2022-04-05: 12 [IU]/h via INTRAVENOUS
  Administered 2022-04-06: 7 [IU]/h via INTRAVENOUS
  Filled 2022-04-05 (×3): qty 100

## 2022-04-05 MED ORDER — INSULIN GLARGINE-YFGN 100 UNIT/ML ~~LOC~~ SOLN: 20.0000 [IU] | Freq: Every day | SUBCUTANEOUS | Status: DC

## 2022-04-05 MED ORDER — INSULIN GLARGINE-YFGN 100 UNIT/ML ~~LOC~~ SOLN
5.0000 [IU] | Freq: Once | SUBCUTANEOUS | Status: AC
  Administered 2022-04-05: 5 [IU] via SUBCUTANEOUS
  Filled 2022-04-05: qty 0.05

## 2022-04-05 MED ORDER — STERILE WATER FOR INJECTION IJ SOLN
INTRAMUSCULAR | Status: AC
  Administered 2022-04-05: 2 mL
  Filled 2022-04-05: qty 10

## 2022-04-05 MED ORDER — NOREPINEPHRINE 16 MG/250ML-% IV SOLN: 2.0000 ug/min | INTRAVENOUS | Status: DC

## 2022-04-05 MED ORDER — LACTATED RINGERS IV SOLN: INTRAVENOUS | Status: DC

## 2022-04-05 NOTE — Progress Notes (Addendum)
NAME:  Melissa Leon, MRN:  [id number], DOB:  Dec 08, 1945, LOS: 6 ADMISSION DATE:  04/08/2022, CONSULTATION DATE:  03/31/2022 REFERRING MD:  Dr. 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   Pt initially was seen in 12/2021 for E. Coli UTI with severe hydronephrosis on the right secondary to ureteral obstruction requiring percutaneous nephrostomy tube placement. She underwent ureteral stent placement in 02/2022 after multiple rounds of antibiotics. Later in 02/2022 she was found to have a lumbar epidural abscess which was operated on and grew MRSA. TTE at that time without vegetations noted. She was discharged with a RUE PICC line on IV daptomycin started 03/06/2022 with planned 8 weeks of therapy to end on 04/12/2022. Following discharge she was admitted again with pneumonia on 03/19/2022. Daptomycin was held during that admission and restarted on discharge 03/25/2022. She was again admitted on 04/04/2022 with generalized weakness, hypothermia, cough, and dyspnea. She was found to have worsening infiltrates bilaterally on CXR and CT. With concerns for daptomycin induced eosinophilic pneumonia she was switched to ceftriaxone and vancomycin.   She has had progressive respiratory decline and is now on maximal support with HFNC 40L at 100% FiO2. She continues to have increased work of breathing, tachypnea, and diaphragmatic paradoxus while maintaining O2 sats of 96. Blood cultures are preliminarily negative. She is having worsening back pain similar to her prior lumbar abscess. She does not have other acute complaints.   Pertinent  Medical History  T2DM HTN HFpEF Spinal Stenosis Chronic IV antibiotic therapy with  daptomycin after MRSA+ lumbar spinal abscess R Ureteral obstruction with resultant hydronephrosis and pyelonephritis s/p JJ stent  Significant Hospital Events: Including procedures, antibiotic start and stop dates in addition to other pertinent events   3/11 admitted with sepsis and worsening multifocal pneumonia on chronic daptomycin, changed to ceftriaxone and vancomycin 3/12 transfer to MICU for intubation 2/2 progressive respiratory distress on HFNC 40L 100% FiO2 3/16 Remains on vent with FIO2 40% and 8 of peep, appears uncomfortable on vent despite fentanyl and precedex drip. Decision made with family mid afternoon to deeply sedate and utilize PRN paralytics in order to rest allow full vent compliance and to pulse dose steroids  3/17 Received to pushes of paralytics yesterday, deeply sedated on vent this am. Decreased bowel sounds    Interim History / Subjective:  Deeply sedated  No BM despite PRN suppository and increased oral laxatives Remains 5L positive   Objective   Blood pressure (!) 118/43, pulse 92, temperature 98 F (36.7 C), temperature source Axillary, resp. rate 16, height 5\' 7"  (1.702 m), weight 81.3 kg, SpO2 96 %.    Vent Mode: PRVC FiO2 (%):  [40 %-50 %] 40 % Set Rate:  [16 bmp] 16 bmp Vt Set:  [360 mL] 360 mL PEEP:  [8 cmH20] 8 cmH20 Plateau Pressure:  [18 cmH20-20 cmH20] 19 cmH20   Intake/Output Summary (Last 24 hours) at 04/05/2022 [id number] Last data filed at 04/05/2022 0600 Gross per 24 hour  Intake 4327.39 ml  Output 2110 ml  Net 2217.39 ml    Filed Weights   04/03/22 0500 04/04/22 0358 04/05/22 0500  Weight: 77.9 kg 78.5 kg 81.3 kg    Examination:   General: Acute on chronically ill appearing elderly female lying in bed on mechanical  ventilation, in NAD HEENT: ETT, MM pink/moist, PERRL,  Neuro: Deeply sedated -5 RASS CV: s1s2 regular rate and rhythm, no murmur, rubs, or gallops,  PULM:  Course breath sounds bilaterally, no increased work of breathing,  compliant on vent  GI: soft, bowel sounds active in all 4 quadrants, non-tender, non-distended, tolerating TF Extremities: warm/dry, 2+ generalized edema  Skin: no rashes or lesions  Resolved Hospital Problem list     Assessment & Plan:  Acute hypoxemic respiratory failure 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 Lower than I would expect for true eosinophilic process.  -RVP negative -Ceftriaxone stopped 3/14 per infectious disease.   P: Continue ventilator support with lung protective strategies  Deep sedation to allow for full vent compliance  PRN paralytics  Wean PEEP and FiO2 for sats greater than 90%. Head of bed elevated 30 degrees. Plateau pressures less than 30 cm H20.  Follow intermittent chest x-ray and ABG.   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 Asymptomatic  P: Follow renal function  Monitor urine output Trend Bmet Avoid nephrotoxins Ensure adequate renal perfusion   HFpEF -ECHO 03/03/22 with EF 65-70% and grade I diastolic dysfunction. On repeat ECHO 3/16 EF remains 65-70% with now normal diastolic function  CAD HTN P: Continuous telemetry  ASA Strict intake and output  Daily weight  Daily assessment for ability to diureses  Home antihypertensives on hold  Sedation related  hypotension  P: Continue low dose Levo   T2DM -Hyperglycemia worsened with high dose steroids  P: Continue resistant SSI and 6 units TF coverage  Increased long acting  CBG goal 140-180 Check CBG q4hrs   Acute right lower extremity DVT -Doppler positive for acute DVT involving the right soleal vein  P: Heparin drip started 3/16  Anemia -No obvious bleeding source P: Follow CBC  Protein calorie malnutrition  Hypoalbuminemia  P: Continue TF coverage  Supplement protein   Physical deconditioning  -Patient has suffered multiple infections over the last several months with multiple admissions. At discharge 03/25/22 PT recommended home health PT due to physical deconditioning. She is again critically ill on life support and will likely have a further decline with deconditioning P: PT/OT efforts as ab;e  Optimize nutrition as able   Constipation  P: Continue Colace and Milk of magnesia  Repeat Dulcolax suppository today if no stool seen with order enema as well    Best Practice (right click and "Reselect all SmartList Selections" daily)   Diet/type: NPO TF DVT prophylaxis: LMWH, SCD GI prophylaxis: N/A Lines: N/A Foley:  N/A Code Status:  full code Last date of multidisciplinary goals of care discussion: Update son at bedside with request made for family meeting per family. Son is to coordinate with his sister timing for meeting  Critical care time:   CRITICAL CARE Performed by: Oralia Criger D. Harris  Total critical care  time: 42 minutes  Critical care time was exclusive of separately billable procedures and treating other patients.  Critical care was necessary to treat or prevent imminent or life-threatening deterioration.  Critical care was time spent personally by me on the following activities: development of treatment plan with patient and/or surrogate as well as nursing, discussions with consultants, evaluation of patient's response to treatment, examination of  patient, obtaining history from patient or surrogate, ordering and performing treatments and interventions, ordering and review of laboratory studies, ordering and review of radiographic studies, pulse oximetry and re-evaluation of patient's condition.  Sekou Zuckerman D. Harris, NP-C [facility] Personal contact information can be found on Amion  If no contact or response made please call [phone] 04/05/2022, 8:22 AM

## 2022-04-05 NOTE — Progress Notes (Signed)
[place] for heparin Indication: DVT  Allergies  Allergen Reactions   Daptomycin Other (See Comments)    Concern for eosinophilic PNA   Choline Fenofibrate Other (See Comments)    ELEVATED LFT.    Penicillins Hives   Ozempic (0.25 Or 0.5 Mg-Dose) [Semaglutide(0.25 Or 0.5mg -Dos)] Nausea Only   Statins Other (See Comments)    Aches, pain    Patient Measurements: Height: 5\' 7"  (170.2 cm) Weight: 81.3 kg (179 lb 3.7 oz) IBW/kg (Calculated) : 61.6 Heparin Dosing Weight: 76kg   Vital Signs: Temp: 97 F (36.1 C) (03/17 1119) Temp Source: Axillary (03/17 1119) BP: 108/43 (03/17 1315) Pulse Rate: 97 (03/17 1315)  Labs: Recent Labs    04/03/22 0514 04/04/22 0436 04/04/22 1957 04/05/22 0553 04/05/22 0837 04/05/22 1242  HGB 7.6* 8.2*  --  7.5*  --   --   HCT 24.3* 24.8*  --  24.6*  --   --   PLT 379 398  --  399  --   --   HEPARINUNFRC  --   --  0.21*  --  0.53  --   CREATININE 0.71 0.79 0.92 1.09*  --   --   TROPONINIHS  --   --   --   --   --  635*     Estimated Creatinine Clearance: 48.2 mL/min (A) (by C-G formula based on SCr of 1.09 mg/dL (H)).   Medical History: Past Medical History:  Diagnosis Date   Anemia    Arthritis    Depression    Diabetes mellitus ([place])    Hypertension    Pneumonia    Spinal stenosis     Assessment: 77 yo F not on anticoagulants prior to admission found to have DVT of L soleal vein. Pharmacy dosing IV heparin  Heparin level= 0.53 (therapeutic) on 1100 units/hr  Goal of Therapy:  Heparin level 0.3-0.7 units/ml Monitor platelets by anticoagulation protocol: Yes   Plan:  -Continue heparin at current rate: 1100 units/hr -Confirmatory heparin level and aPTT in 8 hours, HL daily, CBC daily F/u transition to enteral therapies as able   Wilson Singer, PharmD Clinical Pharmacist 04/05/2022 1:59 PM

## 2022-04-05 NOTE — Progress Notes (Signed)
[place] for heparin Indication: DVT  Allergies  Allergen Reactions   Daptomycin Other (See Comments)    Concern for eosinophilic PNA   Choline Fenofibrate Other (See Comments)    ELEVATED LFT.    Penicillins Hives   Ozempic (0.25 Or 0.5 Mg-Dose) [Semaglutide(0.25 Or 0.5mg -Dos)] Nausea Only   Statins Other (See Comments)    Aches, pain    Patient Measurements: Height: 5\' 7"  (170.2 cm) Weight: 81.3 kg (179 lb 3.7 oz) IBW/kg (Calculated) : 61.6 Heparin Dosing Weight: 76kg   Vital Signs: Temp: 98 F (36.7 C) (03/17 0733) Temp Source: Axillary (03/17 0733) BP: 118/44 (03/17 0845) Pulse Rate: 87 (03/17 0845)  Labs: Recent Labs    04/03/22 0514 04/04/22 0436 04/04/22 1957 04/05/22 0553 04/05/22 0837  HGB 7.6* 8.2*  --  7.5*  --   HCT 24.3* 24.8*  --  24.6*  --   PLT 379 398  --  399  --   HEPARINUNFRC  --   --  0.21*  --  0.53  CREATININE 0.71 0.79 0.92 1.09*  --      Estimated Creatinine Clearance: 48.2 mL/min (A) (by C-G formula based on SCr of 1.09 mg/dL (H)).   Medical History: Past Medical History:  Diagnosis Date   Anemia    Arthritis    Depression    Diabetes mellitus ([place])    Hypertension    Pneumonia    Spinal stenosis     Assessment: 77 yo F not on anticoagulants prior to admission found to have DVT of L soleal vein. Pharmacy dosing IV heparin  Heparin level= 0.53 (therapeutic) on 1100 units/hr  Goal of Therapy:  Heparin level 0.3-0.7 units/ml Monitor platelets by anticoagulation protocol: Yes   Plan:  -Continue heparin at current rate: 1100 units/hr -Confirmatory heparin level in 8 hours, HL daily, CBC daily F/u transition to enteral therapies as able   Wilson Singer, PharmD Clinical Pharmacist 04/05/2022 9:07 AM

## 2022-04-05 NOTE — Progress Notes (Signed)
PCCM Progress Note   Notified of continued critically elevated troponin now up to 757, patient is too critical for further ischemic workup at this time, continue heparin drip.    Continued resistant hyperglycemia in the setting of high dose steroids, will proceed with initiation of insulin drip to better control blood sugar  Jacksyn Beeks D. Harris, NP-C [facility] Personal contact information can be found on Amion  If no contact or response made please call [phone] 04/05/2022, 4:57 PM

## 2022-04-05 NOTE — Progress Notes (Signed)
PCCM Progress Note  Notified by RN that patient has had a rhythm change on bedside monitor and 12 lead EKG was obtained and reveled ST and T wave abnormalities. 12 lead reviewed and ST depression was seen in II, III, AVF, V3 and V4.     No ST elevation was seen. HS trop were trended and first resulted critical at 635. Patient is already on a heparin drip for acute DVT. Discussed with attending and decision was made to continue to trend troponin for now.   Melissa Zurcher D. Harris, NP-C [facility] Personal contact information can be found on Amion  If no contact or response made please call [phone] 04/05/2022, 2:16 PM

## 2022-04-05 NOTE — Progress Notes (Addendum)
Pharmacy Antibiotic Note  Areatha V Niblett is a 77 y.o. female admitted on 04/01/2022 with sepsis.  She has a Hx of MRSA epidural abscess and has been treated with daptomycin outpatient with an EOT of 04/12/22. CXR was consistent with multifocal PNA, likely eosinophilic PNA in the setting of prolonged daptomycin treatment. Pharmacy has been consulted for vancomycin dosing for MRSA epidural abscess.   3/14 Vpk 37, Vtr 15 drawn appropriately.   Vanc trough came back at 19 but collected ~2 hrs early. True trough ~17 so still in goal.  Plan: Continue Vancomycin 1000mg  Q24h estimate AUC ~520  Monitor renal function and levels for dose adjustments as indicated.     Temp (24hrs), Avg:97.5 F (36.4 C), Min:96.8 F (36 C), Max:98 F (36.7 C)  Recent Labs  Lab 04/07/2022 1218 04/02/2022 1941 03/31/22 0208 04/01/22 0129 04/01/22 1738 04/02/22 0524 04/02/22 1300 04/02/22 1650 04/03/22 0514 04/04/22 0436 04/04/22 1957 04/05/22 0553 04/05/22 1556  WBC 16.9*  --    < > 17.4*  --  19.9*  --   --  17.9* 16.8*  --  28.6*  --   CREATININE 0.89  --    < > 0.84  --  0.89  --  0.85 0.71 0.79 0.92 1.09*  --   LATICACIDVEN 3.3* 1.1  --   --   --   --   --   --   --   --   --   --   --   VANCOTROUGH  --   --   --   --   --   --  15  --   --   --   --   --  19  VANCOPEAK  --   --   --   --  37  --   --   --   --   --   --   --   --    < > = values in this interval not displayed.     Estimated Creatinine Clearance: 48.2 mL/min (A) (by C-G formula based on SCr of 1.09 mg/dL (H)).    Allergies  Allergen Reactions   Daptomycin Other (See Comments)    Concern for eosinophilic PNA   Choline Fenofibrate Other (See Comments)    ELEVATED LFT.    Penicillins Hives   Ozempic (0.25 Or 0.5 Mg-Dose) [Semaglutide(0.25 Or 0.5mg -Dos)] Nausea Only   Statins Other (See Comments)    Aches, pain    Thank you for allowing pharmacy to be a part of this patient's care.  Antimicrobials this admission:  Vanc 3/11  >> CTX 3/11 >>3/14  Dose adjustments: 3/14 Vanc 1250 Q24h > 1000 Q24h  Cultures: 3/11 Bcx: ngtd 3/12 bronchial lavage: ngtd 3/12 resp panel negative 3/12 MRSA PCR negative 3/12 Acid fast culture: pending 3/12 fungus culture: pending 3/12 AFB: pending 3/12 pneumocystis smear: pending  Onnie Boer, PharmD, BCIDP, AAHIVP, CPP Infectious Disease Pharmacist 04/05/2022 6:17 PM

## 2022-04-05 NOTE — Progress Notes (Signed)
[place] for heparin Indication: DVT  Allergies  Allergen Reactions   Daptomycin Other (See Comments)    Concern for eosinophilic PNA   Choline Fenofibrate Other (See Comments)    ELEVATED LFT.    Penicillins Hives   Ozempic (0.25 Or 0.5 Mg-Dose) [Semaglutide(0.25 Or 0.5mg -Dos)] Nausea Only   Statins Other (See Comments)    Aches, pain    Patient Measurements: Height: 5\' 7"  (170.2 cm) Weight: 81.3 kg (179 lb 3.7 oz) IBW/kg (Calculated) : 61.6 Heparin Dosing Weight: 76kg   Vital Signs: Temp: 96.8 F (36 C) (03/17 1553) Temp Source: Axillary (03/17 1553) BP: 103/41 (03/17 1700) Pulse Rate: 104 (03/17 1700)  Labs: Recent Labs    04/03/22 0514 04/04/22 0436 04/04/22 1957 04/05/22 0553 04/05/22 0837 04/05/22 1242 04/05/22 1428 04/05/22 1556  HGB 7.6* 8.2*  --  7.5*  --   --   --   --   HCT 24.3* 24.8*  --  24.6*  --   --   --   --   PLT 379 398  --  399  --   --   --   --   APTT  --   --   --   --   --   --   --  88*  HEPARINUNFRC  --   --  0.21*  --  0.53  --   --  0.47  CREATININE 0.71 0.79 0.92 1.09*  --   --   --   --   TROPONINIHS  --   --   --   --   --  635* 757*  --      Estimated Creatinine Clearance: 48.2 mL/min (A) (by C-G formula based on SCr of 1.09 mg/dL (H)).   Medical History: Past Medical History:  Diagnosis Date   Anemia    Arthritis    Depression    Diabetes mellitus ([place])    Hypertension    Pneumonia    Spinal stenosis     Assessment: 77 yo F not on anticoagulants prior to admission found to have DVT of L soleal vein. Pharmacy dosing IV heparin. Using aPTTs for monitoring as elevatedc  -aptt= 99 (therapeutic) on 1100 units/hr  Goal of Therapy:  aPTT= 66-102 Heparin level 0.3-0.7 units/ml Monitor platelets by anticoagulation protocol: Yes   Plan:  -Continue heparin at current rate: 1100 units/hr -Daily aPTT and CBC  Hildred Laser, PharmD Clinical Pharmacist **Pharmacist phone directory  can now be found on [email or web address] ([email or web address]).  Listed under [place].

## 2022-04-06 ENCOUNTER — Inpatient Hospital Stay (HOSPITAL_COMMUNITY): Payer: Medicare HMO

## 2022-04-06 DIAGNOSIS — Z9911 Dependence on respirator [ventilator] status: Secondary | ICD-10-CM | POA: Diagnosis not present

## 2022-04-06 DIAGNOSIS — I5032 Chronic diastolic (congestive) heart failure: Secondary | ICD-10-CM | POA: Diagnosis not present

## 2022-04-06 DIAGNOSIS — J80 Acute respiratory distress syndrome: Secondary | ICD-10-CM

## 2022-04-06 DIAGNOSIS — J8281 Chronic eosinophilic pneumonia: Secondary | ICD-10-CM | POA: Diagnosis not present

## 2022-04-06 DIAGNOSIS — J9601 Acute respiratory failure with hypoxia: Secondary | ICD-10-CM | POA: Diagnosis not present

## 2022-04-06 DIAGNOSIS — J189 Pneumonia, unspecified organism: Secondary | ICD-10-CM | POA: Diagnosis not present

## 2022-04-06 LAB — APTT: aPTT: 61 seconds — ABNORMAL HIGH (ref 24–36)

## 2022-04-06 LAB — BASIC METABOLIC PANEL
Anion gap: 9 (ref 5–15)
BUN: 89 mg/dL — ABNORMAL HIGH (ref 8–23)
CO2: 24 mmol/L (ref 22–32)
Calcium: 8.4 mg/dL — ABNORMAL LOW (ref 8.9–10.3)
Chloride: 94 mmol/L — ABNORMAL LOW (ref 98–111)
Creatinine, Ser: 1.81 mg/dL — ABNORMAL HIGH (ref 0.44–1.00)
GFR, Estimated: 29 mL/min — ABNORMAL LOW (ref >60)
Glucose, Bld: 174 mg/dL — ABNORMAL HIGH (ref 70–99)
Potassium: 5.4 mmol/L — ABNORMAL HIGH (ref 3.5–5.1)
Sodium: 127 mmol/L — ABNORMAL LOW (ref 135–145)

## 2022-04-06 LAB — CBC
HCT: 25.9 % — ABNORMAL LOW (ref 36.0–46.0)
Hemoglobin: 8 g/dL — ABNORMAL LOW (ref 12.0–15.0)
MCH: 28.7 pg (ref 26.0–34.0)
MCHC: 30.9 g/dL (ref 30.0–36.0)
MCV: 92.8 fL (ref 80.0–100.0)
Platelets: 429 10*3/uL — ABNORMAL HIGH (ref 150–400)
RBC: 2.79 MIL/uL — ABNORMAL LOW (ref 3.87–5.11)
RDW: 15.4 % (ref 11.5–15.5)
WBC: 54.9 10*3/uL (ref 4.0–10.5)
nRBC: 0.2 % (ref 0.0–0.2)

## 2022-04-06 LAB — TROPONIN I (HIGH SENSITIVITY): Troponin I (High Sensitivity): 1010 ng/L (ref <18)

## 2022-04-06 LAB — HEPATIC FUNCTION PANEL
ALT: 26 U/L (ref 0–44)
AST: 19 U/L (ref 15–41)
Albumin: 2 g/dL — ABNORMAL LOW (ref 3.5–5.0)
Alkaline Phosphatase: 110 U/L (ref 38–126)
Bilirubin, Direct: 0.2 mg/dL (ref 0.0–0.2)
Indirect Bilirubin: 0.6 mg/dL (ref 0.3–0.9)
Total Bilirubin: 0.8 mg/dL (ref 0.3–1.2)
Total Protein: 5.6 g/dL — ABNORMAL LOW (ref 6.5–8.1)

## 2022-04-06 LAB — GLUCOSE, CAPILLARY
Glucose-Capillary: 156 mg/dL — ABNORMAL HIGH (ref 70–99)
Glucose-Capillary: 163 mg/dL — ABNORMAL HIGH (ref 70–99)
Glucose-Capillary: 165 mg/dL — ABNORMAL HIGH (ref 70–99)
Glucose-Capillary: 165 mg/dL — ABNORMAL HIGH (ref 70–99)
Glucose-Capillary: 175 mg/dL — ABNORMAL HIGH (ref 70–99)
Glucose-Capillary: 178 mg/dL — ABNORMAL HIGH (ref 70–99)
Glucose-Capillary: 202 mg/dL — ABNORMAL HIGH (ref 70–99)
Glucose-Capillary: 318 mg/dL — ABNORMAL HIGH (ref 70–99)

## 2022-04-06 LAB — PHOSPHORUS: Phosphorus: 6 mg/dL — ABNORMAL HIGH (ref 2.5–4.6)

## 2022-04-06 LAB — MAGNESIUM: Magnesium: 2.8 mg/dL — ABNORMAL HIGH (ref 1.7–2.4)

## 2022-04-06 MED ORDER — POLYVINYL ALCOHOL 1.4 % OP SOLN: 1.0000 [drp] | Freq: Four times a day (QID) | OPHTHALMIC | Status: DC | PRN

## 2022-04-06 MED ORDER — GLYCOPYRROLATE 0.2 MG/ML IJ SOLN
0.2000 mg | INTRAMUSCULAR | Status: DC | PRN
  Administered 2022-04-06: 0.2 mg via INTRAVENOUS
  Filled 2022-04-06: qty 1

## 2022-04-06 MED ORDER — GLYCOPYRROLATE 0.2 MG/ML IJ SOLN: 0.2000 mg | INTRAMUSCULAR | Status: DC | PRN

## 2022-04-06 MED ORDER — ONDANSETRON 4 MG PO TBDP: 4.0000 mg | ORAL_TABLET | Freq: Four times a day (QID) | ORAL | Status: DC | PRN

## 2022-04-06 MED ORDER — VASOPRESSIN 20 UNITS/100 ML INFUSION FOR SHOCK
INTRAVENOUS | Status: AC
  Filled 2022-04-06: qty 100

## 2022-04-06 MED ORDER — DIPHENHYDRAMINE HCL 50 MG/ML IJ SOLN: 25.0000 mg | INTRAMUSCULAR | Status: DC | PRN

## 2022-04-06 MED ORDER — MIDAZOLAM-SODIUM CHLORIDE 100-0.9 MG/100ML-% IV SOLN: 0.0000 mg/h | INTRAVENOUS | Status: DC

## 2022-04-06 MED ORDER — INSULIN DETEMIR 100 UNIT/ML ~~LOC~~ SOLN
0.3000 [IU]/kg | SUBCUTANEOUS | Status: DC
  Administered 2022-04-06: 24 [IU] via SUBCUTANEOUS
  Filled 2022-04-06: qty 0.24

## 2022-04-06 MED ORDER — SODIUM ZIRCONIUM CYCLOSILICATE 5 G PO PACK
5.0000 g | PACK | Freq: Two times a day (BID) | ORAL | Status: DC
  Administered 2022-04-06: 5 g via ORAL
  Filled 2022-04-06 (×2): qty 1

## 2022-04-06 MED ORDER — FENTANYL BOLUS VIA INFUSION
100.0000 ug | INTRAVENOUS | Status: DC | PRN
  Administered 2022-04-06: 100 ug via INTRAVENOUS

## 2022-04-06 MED ORDER — ONDANSETRON HCL 4 MG/2ML IJ SOLN: 4.0000 mg | Freq: Four times a day (QID) | INTRAMUSCULAR | Status: DC | PRN

## 2022-04-06 MED ORDER — INSULIN ASPART 100 UNIT/ML IJ SOLN
8.0000 [IU] | INTRAMUSCULAR | Status: DC
  Administered 2022-04-06: 8 [IU] via SUBCUTANEOUS

## 2022-04-06 MED ORDER — HALOPERIDOL LACTATE 5 MG/ML IJ SOLN: 2.5000 mg | INTRAMUSCULAR | Status: DC | PRN

## 2022-04-06 MED ORDER — MIDAZOLAM HCL 2 MG/2ML IJ SOLN
1.0000 mg | INTRAMUSCULAR | Status: DC | PRN
  Administered 2022-04-06 (×2): 1 mg via INTRAVENOUS
  Filled 2022-04-06: qty 2

## 2022-04-06 MED ORDER — INSULIN ASPART 100 UNIT/ML IJ SOLN
0.0000 [IU] | INTRAMUSCULAR | Status: DC
  Administered 2022-04-06: 15 [IU] via SUBCUTANEOUS

## 2022-04-06 MED ORDER — INSULIN ASPART 100 UNIT/ML IJ SOLN
0.0000 [IU] | INTRAMUSCULAR | Status: DC
  Administered 2022-04-06: 4 [IU] via SUBCUTANEOUS
  Administered 2022-04-06: 7 [IU] via SUBCUTANEOUS

## 2022-04-06 MED ORDER — ALBUTEROL SULFATE (2.5 MG/3ML) 0.083% IN NEBU: 2.5000 mg | INHALATION_SOLUTION | RESPIRATORY_TRACT | Status: DC | PRN

## 2022-04-06 MED ORDER — INSULIN DETEMIR 100 UNIT/ML ~~LOC~~ SOLN
0.3000 [IU]/kg | Freq: Two times a day (BID) | SUBCUTANEOUS | Status: DC
  Filled 2022-04-06: qty 0.24

## 2022-04-06 MED ORDER — INSULIN DETEMIR 100 UNIT/ML ~~LOC~~ SOLN: 24.0000 [IU] | Freq: Two times a day (BID) | SUBCUTANEOUS | Status: DC

## 2022-04-06 MED ORDER — FENTANYL 2500MCG IN NS 250ML (10MCG/ML) PREMIX INFUSION: 0.0000 ug/h | INTRAVENOUS | Status: DC

## 2022-04-06 MED ORDER — LINEZOLID 600 MG/300ML IV SOLN
600.0000 mg | Freq: Two times a day (BID) | INTRAVENOUS | Status: DC
  Administered 2022-04-06: 600 mg via INTRAVENOUS
  Filled 2022-04-06 (×2): qty 300

## 2022-04-06 MED ORDER — SODIUM CHLORIDE 0.9 % IV SOLN: 2.0000 g | INTRAVENOUS | Status: DC

## 2022-04-06 MED ORDER — MIDAZOLAM BOLUS VIA INFUSION (WITHDRAWAL LIFE SUSTAINING TX): 2.0000 mg | INTRAVENOUS | Status: DC | PRN

## 2022-04-06 MED ORDER — ACETAMINOPHEN 650 MG RE SUPP: 650.0000 mg | Freq: Four times a day (QID) | RECTAL | Status: DC | PRN

## 2022-04-06 MED ORDER — GLYCOPYRROLATE 1 MG PO TABS: 1.0000 mg | ORAL_TABLET | ORAL | Status: DC | PRN

## 2022-04-06 MED ORDER — VASOPRESSIN 20 UNITS/100 ML INFUSION FOR SHOCK
0.0000 [IU]/min | INTRAVENOUS | Status: DC
  Administered 2022-04-06: 0.03 [IU]/min via INTRAVENOUS

## 2022-04-06 MED ORDER — SODIUM CHLORIDE 0.9 % IV SOLN: INTRAVENOUS | Status: DC

## 2022-04-06 MED ORDER — ACETAMINOPHEN 325 MG PO TABS: 650.0000 mg | ORAL_TABLET | Freq: Four times a day (QID) | ORAL | Status: DC | PRN

## 2022-04-06 MED ORDER — DEXMEDETOMIDINE HCL IN NACL 400 MCG/100ML IV SOLN
INTRAVENOUS | Status: AC
  Filled 2022-04-06: qty 100

## 2022-04-07 LAB — CULTURE, RESPIRATORY W GRAM STAIN

## 2022-04-08 LAB — PNEUMOCYSTIS JIROVECI SMEAR BY DFA

## 2022-04-20 ENCOUNTER — Telehealth: Payer: Self-pay | Admitting: Pulmonary Disease

## 2022-04-20 NOTE — Progress Notes (Signed)
SLP Cancellation Note  Patient Details Name: Melissa Leon MRN: [id number] DOB: [birth year]   Cancelled treatment:    Pt remains on vent. SLP will sign off and await new orders.  [name] L. Tivis Ringer, MA CCC/SLP Clinical Specialist - Acute Care SLP Acute Rehabilitation Services Office number [phone]        [name] Laurice 17-Apr-2022, 7:47 AM

## 2022-04-20 NOTE — Progress Notes (Signed)
Subjective:  Patient intubated and critically    Antibiotics:  Anti-infectives (From admission, onward)    Start     Dose/Rate Route Frequency Ordered Stop   2022/04/07 2312  ceFEPIme (MAXIPIME) 2 g in sodium chloride 0.9 % 100 mL IVPB  Status:  Discontinued        2 g 200 mL/hr over 30 Minutes Intravenous Every 24 hours April 07, 2022 0856 04/07/22 1330   2022-04-07 1000  linezolid (ZYVOX) IVPB 600 mg  Status:  Discontinued        600 mg 300 mL/hr over 60 Minutes Intravenous Every 12 hours 04-07-2022 0856 04-07-2022 1330   04-07-2022 0000  ceFEPIme (MAXIPIME) 2 g in sodium chloride 0.9 % 100 mL IVPB  Status:  Discontinued        2 g 200 mL/hr over 30 Minutes Intravenous Every 12 hours 04/05/22 1357 04-07-22 0856   04/04/22 1200  ceFEPIme (MAXIPIME) 2 g in sodium chloride 0.9 % 100 mL IVPB  Status:  Discontinued        2 g 200 mL/hr over 30 Minutes Intravenous Every 8 hours 04/04/22 1019 04/05/22 1357   04/02/22 1630  vancomycin (VANCOCIN) IVPB 1000 mg/200 mL premix  Status:  Discontinued        1,000 mg 200 mL/hr over 60 Minutes Intravenous Every 24 hours 04/02/22 1541 2022-04-07 0856   03/31/22 1400  vancomycin (VANCOREADY) IVPB 1250 mg/250 mL  Status:  Discontinued        1,250 mg 166.7 mL/hr over 90 Minutes Intravenous Every 24 hours 03/21/2022 1347 04/02/22 1541   03/31/22 1000  cefTRIAXone (ROCEPHIN) 2 g in sodium chloride 0.9 % 100 mL IVPB  Status:  Discontinued        2 g 200 mL/hr over 30 Minutes Intravenous Every 24 hours 04/12/2022 1730 04/02/22 1503   04/01/2022 1400  vancomycin (VANCOREADY) IVPB 1500 mg/300 mL        1,500 mg 150 mL/hr over 120 Minutes Intravenous  Once 03/28/2022 1345 04/08/2022 1946   03/21/2022 1345  cefTRIAXone (ROCEPHIN) 2 g in sodium chloride 0.9 % 100 mL IVPB        2 g 200 mL/hr over 30 Minutes Intravenous  Once 03/29/2022 1330 04/17/2022 1456       Medications: Scheduled Meds:  Chlorhexidine Gluconate Cloth  6 each Topical Daily   Gerhardt's butt cream    Topical BID   methylPREDNISolone (SOLU-MEDROL) injection  125 mg Intravenous Q6H   sodium chloride flush  10-40 mL Intracatheter Q12H   Continuous Infusions:  sodium chloride 10 mL/hr at 04-07-22 0000   sodium chloride Stopped (04/04/22 1606)   sodium chloride     dexmedetomidine     fentaNYL infusion INTRAVENOUS 250 mcg/hr (04/07/2022 0700)   fentaNYL infusion INTRAVENOUS     midazolam     norepinephrine (LEVOPHED) Adult infusion 24 mcg/min (04/07/2022 0700)   propofol (DIPRIVAN) infusion 80 mcg/kg/min (2022/04/07 1330)   vasopressin 0.03 Units/min (04/07/2022 1044)   PRN Meds:.sodium chloride, acetaminophen **OR** acetaminophen, albuterol, dexmedetomidine, diphenhydrAMINE, fentaNYL, glycopyrrolate **OR** glycopyrrolate **OR** glycopyrrolate, haloperidol lactate, midazolam, midazolam, ondansetron **OR** ondansetron (ZOFRAN) IV, sodium chloride flush    Objective: Weight change: 4.6 kg  Intake/Output Summary (Last 24 hours) at Apr 07, 2022 1424 Last data filed at 07-Apr-2022 1000 Gross per 24 hour  Intake 4649.03 ml  Output 270 ml  Net 4379.03 ml    Blood pressure (!) 138/47, pulse (!) 125, temperature 98 F (36.7 C), temperature source Axillary, resp. rate  17, height 5\' 7"  (1.702 m), weight 85.9 kg, SpO2 95 %. Temp:  [96.6 F (35.9 C)-98.8 F (37.1 C)] 98 F (36.7 C) May 05, 2022 1118) Pulse Rate:  [89-132] 125 2022/05/05 1400) Resp:  [8-23] 17 05-May-2022 1400) BP: (97-151)/(33-56) 138/47 05-05-22 1400) SpO2:  [92 %-100 %] 95 % 2022-05-05 1400) FiO2 (%):  [40 %] 40 % 2022-05-05 1130) Weight:  [85.9 kg] 85.9 kg 05-May-2022 0500)  Physical Exam: Physical Exam Constitutional:      General: She is not in acute distress.    Appearance: She is well-developed. She is not diaphoretic.     Interventions: She is intubated.  HENT:     Head: Normocephalic and atraumatic.     Right Ear: External ear normal.     Left Ear: External ear normal.     Mouth/Throat:     Pharynx: No oropharyngeal exudate.  Eyes:      General: No scleral icterus.    Extraocular Movements: Extraocular movements intact.     Conjunctiva/sclera: Conjunctivae normal.  Cardiovascular:     Rate and Rhythm: Regular rhythm. Tachycardia present.  Pulmonary:     Effort: She is intubated.  Musculoskeletal:        General: No tenderness. Normal range of motion.  Lymphadenopathy:     Cervical: No cervical adenopathy.  Skin:    General: Skin is warm and dry.     Coloration: Skin is pale.     Findings: No erythema or rash.  Neurological:     Mental Status: She is alert.     Motor: No abnormal muscle tone.     Coordination: Coordination normal.      CBC:    BMET Recent Labs    04/05/22 0553 2022-05-05 0311  NA 132* 127*  K 4.5 5.4*  CL 97* 94*  CO2 25 24  GLUCOSE 366* 174*  BUN 66* 89*  CREATININE 1.09* 1.81*  CALCIUM 8.0* 8.4*      Liver Panel  Recent Labs    2022-05-05 0311  PROT 5.6*  ALBUMIN 2.0*  AST 19  ALT 26  ALKPHOS 110  BILITOT 0.8  BILIDIR 0.2  IBILI 0.6        Sedimentation Rate No results for input(s): "ESRSEDRATE" in the last 72 hours. C-Reactive Protein No results for input(s): "CRP" in the last 72 hours.   Micro Results: Recent Results (from the past 720 hour(s))  Blood culture (routine x 2)     Status: None   Collection Time: 03/19/22  7:35 PM   Specimen: BLOOD RIGHT HAND  Result Value Ref Range Status   Specimen Description BLOOD RIGHT HAND  Final   Special Requests   Final    BOTTLES DRAWN AEROBIC ONLY Blood Culture adequate volume   Culture   Final    NO GROWTH 5 DAYS Performed at [facility], [address] [place], [place] [address]    Report Status 03/24/2022 FINAL  Final  Blood culture (routine x 2)     Status: None   Collection Time: 03/19/22  7:48 PM   Specimen: BLOOD RIGHT ARM  Result Value Ref Range Status   Specimen Description BLOOD RIGHT ARM  Final   Special Requests   Final    BOTTLES DRAWN AEROBIC AND ANAEROBIC Blood Culture adequate volume    Culture   Final    NO GROWTH 5 DAYS Performed at [facility], [place] [address] [place], [place] [address]    Report Status 03/24/2022 FINAL  Final  Resp panel by RT-PCR (RSV, Flu A&B, Covid) Anterior Nasal Swab     Status: None   Collection Time: 03/19/22 10:25 PM   Specimen: Anterior Nasal Swab  Result Value Ref Range Status   SARS Coronavirus 2 by RT PCR NEGATIVE NEGATIVE Final   Influenza A by PCR NEGATIVE NEGATIVE Final   Influenza B by PCR NEGATIVE NEGATIVE Final    Comment: (NOTE) The Xpert Xpress SARS-CoV-2/FLU/RSV plus assay is intended as an aid in the diagnosis of influenza from Nasopharyngeal swab specimens and should not be used as a sole basis for treatment. Nasal washings and aspirates are unacceptable for Xpert Xpress SARS-CoV-2/FLU/RSV testing.  Fact Sheet for Patients: EntrepreneurPulse.com.au  Fact Sheet for Healthcare Providers: IncredibleEmployment.be  This test is not yet approved or cleared by the [place] FDA and has been authorized for detection and/or diagnosis of SARS-CoV-2 by FDA under an Emergency Use Authorization (EUA). This EUA will remain in effect (meaning this test can be used) for the duration of the COVID-19 declaration under Section 564(b)(1) of the Act, 21 U.S.C. section 360bbb-3(b)(1), unless the authorization is terminated or revoked.     Resp Syncytial Virus by PCR NEGATIVE NEGATIVE Final    Comment: (NOTE) Fact Sheet for Patients: EntrepreneurPulse.com.au  Fact Sheet for Healthcare Providers: IncredibleEmployment.be  This test is not yet approved or cleared by the [place] FDA and has been authorized for detection and/or diagnosis of SARS-CoV-2 by FDA under an Emergency Use Authorization (EUA). This EUA will remain in effect (meaning this test can be used) for the duration of the COVID-19 declaration under Section 564(b)(1) of the Act, 21  U.S.C. section 360bbb-3(b)(1), unless the authorization is terminated or revoked.  Performed at [facility], [place] [address], [place], [place] [address]   Urine Culture     Status: Abnormal   Collection Time: 03/20/22 12:45 AM   Specimen: Urine, Random  Result Value Ref Range Status   Specimen Description URINE, RANDOM  Final   Special Requests   Final    NONE Reflexed from [id number] Performed at [facility], [place] [address], [place], [place] [address]    Culture 10,000 COLONIES/mL YEAST (A)  Final   Report Status 03/21/2022 FINAL  Final  MRSA Next Gen by PCR, Nasal     Status: None   Collection Time: 03/20/22  8:15 AM   Specimen: Nasal Mucosa; Nasal Swab  Result Value Ref Range Status   MRSA by PCR Next Gen NOT DETECTED NOT DETECTED Final    Comment: (NOTE) The GeneXpert MRSA Assay (FDA approved for NASAL specimens only), is one component of a comprehensive MRSA colonization surveillance program. It is not intended to diagnose MRSA infection nor to guide or monitor treatment for MRSA infections. Test performance is not FDA approved in patients less than 43 years old. Performed at [facility], [place] [address], [place], [place] [address]   Respiratory (~20 pathogens) panel by PCR     Status: None   Collection Time: 03/20/22  8:16 AM   Specimen: Nasopharyngeal Swab; Respiratory  Result Value Ref Range Status   Adenovirus NOT DETECTED NOT DETECTED Final   Coronavirus 229E NOT DETECTED NOT DETECTED Final    Comment: (NOTE) The Coronavirus on the Respiratory Panel, DOES NOT test for the novel  Coronavirus (2019 nCoV)    Coronavirus HKU1 NOT DETECTED NOT DETECTED Final   Coronavirus NL63 NOT DETECTED NOT DETECTED Final   Coronavirus OC43 NOT DETECTED NOT  DETECTED Final   Metapneumovirus NOT DETECTED NOT DETECTED Final   Rhinovirus / Enterovirus NOT DETECTED NOT DETECTED Final   Influenza A NOT DETECTED NOT DETECTED Final   Influenza B NOT DETECTED NOT DETECTED  Final   Parainfluenza Virus 1 NOT DETECTED NOT DETECTED Final   Parainfluenza Virus 2 NOT DETECTED NOT DETECTED Final   Parainfluenza Virus 3 NOT DETECTED NOT DETECTED Final   Parainfluenza Virus 4 NOT DETECTED NOT DETECTED Final   Respiratory Syncytial Virus NOT DETECTED NOT DETECTED Final   Bordetella pertussis NOT DETECTED NOT DETECTED Final   Bordetella Parapertussis NOT DETECTED NOT DETECTED Final   Chlamydophila pneumoniae NOT DETECTED NOT DETECTED Final   Mycoplasma pneumoniae NOT DETECTED NOT DETECTED Final    Comment: Performed at [facility], [place] [address] [place], [place] [address]  Culture, blood (routine x 2)     Status: None   Collection Time: 04/11/2022 12:18 PM   Specimen: BLOOD  Result Value Ref Range Status   Specimen Description BLOOD LEFT ANTECUBITAL  Final   Special Requests   Final    BOTTLES DRAWN AEROBIC AND ANAEROBIC Blood Culture adequate volume   Culture   Final    NO GROWTH 5 DAYS Performed at [facility], [place] [address] [place], [place] [address]    Report Status 04/04/2022 FINAL  Final  Culture, blood (routine x 2)     Status: None   Collection Time: 04/07/2022  7:42 PM   Specimen: BLOOD  Result Value Ref Range Status   Specimen Description BLOOD LEFT ANTECUBITAL  Final   Special Requests   Final    BOTTLES DRAWN AEROBIC AND ANAEROBIC Blood Culture results may not be optimal due to an inadequate volume of blood received in culture bottles   Culture   Final    NO GROWTH 5 DAYS Performed at [facility], [place] [address], [place], [place] [address]    Report Status 04/04/2022 FINAL  Final  Culture, Respiratory w Gram Stain     Status: None   Collection Time: 03/31/22 10:13 AM   Specimen: Bronchoalveolar Lavage; Respiratory  Result Value Ref Range Status   Specimen Description BRONCHIAL ALVEOLAR LAVAGE  Final   Special Requests NONE  Final   Gram Stain   Final    FEW WBC PRESENT, PREDOMINANTLY PMN NO ORGANISMS SEEN    Culture    Final    NO GROWTH 2 DAYS Performed at [facility], [address] [place], [place] [address]    Report Status 04/02/2022 FINAL  Final  Acid Fast Smear (AFB)     Status: None   Collection Time: 03/31/22 10:13 AM   Specimen: Bronchoalveolar Lavage; Respiratory  Result Value Ref Range Status   AFB Specimen Processing Concentration  Final   Acid Fast Smear Negative  Final    Comment: (NOTE) Performed At: [facility] [place], [place] [id number] Rush Farmer MD [id number]    Source (AFB) BRONCHIAL ALVEOLAR LAVAGE  Final    Comment: Performed at [facility], [place] [address] [place], [place] [address]  Fungus Culture With Stain     Status: None (Preliminary result)   Collection Time: 03/31/22 10:13 AM   Specimen: Bronchial Alveolar Lavage; Respiratory  Result Value Ref Range Status   Fungus Stain Final report  Final    Comment: (NOTE) Performed At: [facility] [place], [place] [id number] Rush Farmer MD [id number]  Fungus (Mycology) Culture PENDING  Incomplete   Fungal Source BRONCHIAL ALVEOLAR LAVAGE  Final    Comment: Performed at [facility], [place] [address], [place], [place] [address]  Respiratory (~20 pathogens) panel by PCR     Status: None   Collection Time: 03/31/22 10:13 AM   Specimen: Bronchoalveolar Lavage; Respiratory  Result Value Ref Range Status   Adenovirus NOT DETECTED NOT DETECTED Final   Coronavirus 229E NOT DETECTED NOT DETECTED Final    Comment: (NOTE) The Coronavirus on the Respiratory Panel, DOES NOT test for the novel  Coronavirus (2019 nCoV)    Coronavirus HKU1 NOT DETECTED NOT DETECTED Final   Coronavirus NL63 NOT DETECTED NOT DETECTED Final   Coronavirus OC43 NOT DETECTED NOT DETECTED Final   Metapneumovirus NOT DETECTED NOT DETECTED Final   Rhinovirus / Enterovirus NOT DETECTED NOT DETECTED Final   Influenza A NOT DETECTED NOT DETECTED Final   Influenza B NOT DETECTED  NOT DETECTED Final   Parainfluenza Virus 1 NOT DETECTED NOT DETECTED Final   Parainfluenza Virus 2 NOT DETECTED NOT DETECTED Final   Parainfluenza Virus 3 NOT DETECTED NOT DETECTED Final   Parainfluenza Virus 4 NOT DETECTED NOT DETECTED Final   Respiratory Syncytial Virus NOT DETECTED NOT DETECTED Final   Bordetella pertussis NOT DETECTED NOT DETECTED Final   Bordetella Parapertussis NOT DETECTED NOT DETECTED Final   Chlamydophila pneumoniae NOT DETECTED NOT DETECTED Final   Mycoplasma pneumoniae NOT DETECTED NOT DETECTED Final    Comment: Performed at [facility], [place], [place], [place] [address]  Fungus Culture Result     Status: None   Collection Time: 03/31/22 10:13 AM  Result Value Ref Range Status   Result 1 Comment  Final    Comment: (NOTE) KOH/Calcofluor preparation:  no fungus observed. Performed At: [facility] [place], [place] [id number] Rush Farmer MD [id number]   MRSA Next Gen by PCR, Nasal     Status: None   Collection Time: 03/31/22 11:30 AM   Specimen: Nasal Mucosa; Nasal Swab  Result Value Ref Range Status   MRSA by PCR Next Gen NOT DETECTED NOT DETECTED Final    Comment: (NOTE) The GeneXpert MRSA Assay (FDA approved for NASAL specimens only), is one component of a comprehensive MRSA colonization surveillance program. It is not intended to diagnose MRSA infection nor to guide or monitor treatment for MRSA infections. Test performance is not FDA approved in patients less than 41 years old. Performed at [facility], [place] [address] [place], [place] [address]   Culture, Respiratory w Gram Stain     Status: None (Preliminary result)   Collection Time: 04/05/22  8:38 AM   Specimen: Tracheal Aspirate; Respiratory  Result Value Ref Range Status   Specimen Description TRACHEAL ASPIRATE  Final   Special Requests NONE  Final   Gram Stain   Final    MODERATE WBC PRESENT, PREDOMINANTLY PMN RARE GRAM POSITIVE  RODS RARE YEAST    Culture   Final    CULTURE REINCUBATED FOR BETTER GROWTH Performed at [facility], [place] [address], [place], [place] [address]    Report Status PENDING  Incomplete    Studies/Results: DG CHEST PORT 1 VIEW  Result Date: April 11, 2022 CLINICAL DATA:  Acute respiratory failure EXAM: PORTABLE CHEST 1 VIEW COMPARISON:  04/05/2022 FINDINGS: Cardiac shadow is stable. Endotracheal tube is noted in satisfactory position. Gastric catheter extends in the stomach. Right PICC is noted with the tip  at the cavoatrial junction stable from the previous exam. Patchy airspace opacities are again of identified throughout both lungs slightly improved when compared with the prior exam. IMPRESSION: Slight improved aeration bilaterally. Electronically Signed   By: Inez Catalina M.D.   On: 2022-04-11 10:41   DG CHEST PORT 1 VIEW  Result Date: 04/05/2022 CLINICAL DATA:  Acute respiratory fail EXAM: PORTABLE CHEST 1 VIEW COMPARISON:  03/06/2022 FINDINGS: Cardiac shadow is stable. Endotracheal tube, gastric catheter and right PICC are again seen and stable. Patchy airspace opacities are again identified but slightly improved when compared with the prior exam although this may be related to the technical factors of the film. No bony abnormality is noted. IMPRESSION: Persistent diffuse airspace opacity slightly improved from the prior exam. Electronically Signed   By: Inez Catalina M.D.   On: 04/05/2022 11:49   ECHOCARDIOGRAM COMPLETE  Result Date: 04/04/2022    ECHOCARDIOGRAM REPORT   Patient Name:   Melissa Leon Date of Exam: 04/04/2022 Medical Rec #:  [id number]        Height:       67.0 in Accession #:    [id number]       Weight:       173.1 lb Date of Birth:  [birth year]         BSA:          1.902 m Patient Age:    23 years         BP:           109/48 mmHg Patient Gender: F                HR:           69 bpm. Exam Location:  Inpatient Procedure: 2D Echo, Cardiac Doppler and Color Doppler  Indications:    CHF-Acute Diastolic [id number]  History:        Patient has prior history of Echocardiogram examinations, most                 recent 03/03/2022. CHF; Risk Factors:Hypertension and Diabetes.  Sonographer:    Ronny Flurry Referring Phys: [id number] [place]  1. Left ventricular ejection fraction, by estimation, is 65 to 70%. The left ventricle has normal function. The left ventricle has no regional wall motion abnormalities. Left ventricular diastolic parameters are indeterminate.  2. Right ventricular systolic function is normal. The right ventricular size is normal.  3. The mitral valve is normal in structure. Trivial mitral valve regurgitation. No evidence of mitral stenosis.  4. The aortic valve is normal in structure. Aortic valve regurgitation is not visualized. No aortic stenosis is present.  5. The inferior vena cava is dilated in size with <50% respiratory variability, suggesting right atrial pressure of 15 mmHg. Comparison(s): No significant change from prior study. Prior images reviewed side by side. FINDINGS  Left Ventricle: Left ventricular ejection fraction, by estimation, is 65 to 70%. The left ventricle has normal function. The left ventricle has no regional wall motion abnormalities. The left ventricular internal cavity size was normal in size. There is  no left ventricular hypertrophy. Left ventricular diastolic parameters are indeterminate. Right Ventricle: The right ventricular size is normal. No increase in right ventricular wall thickness. Right ventricular systolic function is normal. Left Atrium: Left atrial size was normal in size. Right Atrium: Right atrial size was normal in size. Pericardium: There is no evidence of pericardial effusion. Mitral Valve: The mitral valve is normal in structure. Trivial mitral valve  regurgitation. No evidence of mitral valve stenosis. Tricuspid Valve: The tricuspid valve is normal in structure. Tricuspid valve regurgitation is  mild . No evidence of tricuspid stenosis. Aortic Valve: The aortic valve is normal in structure. Aortic valve regurgitation is not visualized. No aortic stenosis is present. Aortic valve mean gradient measures 7.0 mmHg. Aortic valve peak gradient measures 12.4 mmHg. Aortic valve area, by VTI measures 2.04 cm. Pulmonic Valve: The pulmonic valve was normal in structure. Pulmonic valve regurgitation is not visualized. No evidence of pulmonic stenosis. Aorta: The aortic root is normal in size and structure. Venous: The inferior vena cava is dilated in size with less than 50% respiratory variability, suggesting right atrial pressure of 15 mmHg. IAS/Shunts: No atrial level shunt detected by color flow Doppler.  LEFT VENTRICLE PLAX 2D LVIDd:         4.50 cm   Diastology LVIDs:         2.80 cm   LV e' medial:    6.76 cm/s LV PW:         1.00 cm   LV E/e' medial:  16.1 LV IVS:        1.00 cm   LV e' lateral:   9.49 cm/s LVOT diam:     1.90 cm   LV E/e' lateral: 11.5 LV SV:         71 LV SV Index:   37 LVOT Area:     2.84 cm  RIGHT VENTRICLE             IVC RV S prime:     16.80 cm/s  IVC diam: 2.10 cm TAPSE (M-mode): 2.0 cm LEFT ATRIUM             Index        RIGHT ATRIUM           Index LA diam:        3.70 cm 1.95 cm/m   RA Area:     11.60 cm LA Vol (A2C):   46.7 ml 24.56 ml/m  RA Volume:   21.90 ml  11.52 ml/m LA Vol (A4C):   25.4 ml 13.36 ml/m LA Biplane Vol: 35.5 ml 18.67 ml/m  AORTIC VALVE AV Area (Vmax):    2.21 cm AV Area (Vmean):   2.07 cm AV Area (VTI):     2.04 cm AV Vmax:           176.00 cm/s AV Vmean:          120.000 cm/s AV VTI:            0.350 m AV Peak Grad:      12.4 mmHg AV Mean Grad:      7.0 mmHg LVOT Vmax:         137.00 cm/s LVOT Vmean:        87.525 cm/s LVOT VTI:          0.251 m LVOT/AV VTI ratio: 0.72  AORTA Ao Root diam: 3.20 cm Ao Asc diam:  3.30 cm MITRAL VALVE MV Area (PHT): 3.27 cm     SHUNTS MV Decel Time: 232 msec     Systemic VTI:  0.25 m MV E velocity: 109.00 cm/s  Systemic  Diam: 1.90 cm MV A velocity: 90.30 cm/s MV E/A ratio:  1.21 Candee Furbish MD Electronically signed by Candee Furbish MD Signature Date/Time: 04/04/2022/4:51:46 PM    Final    [place] EKG SITE RITE  Result Date: 04/04/2022 If Site Rite image not attached,  placement could not be confirmed due to current cardiac rhythm.     Assessment/Plan:  INTERVAL HISTORY:   Patient worsened over the weekend.  She has now had cefepime added.  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  #1  Presumptive eosinophilic pneumonia  We do not have concrete proof though in my view she very much her clinical picture very much fits the picture  Her lack of response to stopping daptomycin and initiation of steroids is puzzling to me but I am sure this does occur, I have never seen it progress as hers has been  She has been on steroids and antibiotics, broader certainly than everything isolated  Could she have a PE in addition to the above?    #2 DVT , ? PE on anticoaguation   #3 vertebral osteomyelitis discitis epidural abscess: MRI of the spine is encouraging.  There are some endplate edema L3   She is switched to IV zyvox  due to worsening renal failure  #4 Renal failure likely multifactorial   #5 Goals of care: patient is now moving to comfort care and they are awaiting additional family members to arrive. Antibiotics have been stopped after our visit--I had preferred CCM be involved in global goals of care today prior to my stopping abx  I spent 52 minutes with the patient including 50% of the time in face to face and non face to face time re the above issues along with review of medical records in preparation for the visit and during the visit and in coordination of her care.    LOS: 7 days   Alcide Evener April 10, 2022, 2:24 PM

## 2022-04-20 NOTE — Progress Notes (Signed)
PHARMACY NOTE:  ANTIMICROBIAL RENAL DOSAGE ADJUSTMENT  Current antimicrobial regimen includes a mismatch between antimicrobial dosage and estimated renal function.  As per policy approved by the Pharmacy & Therapeutics and Medical Executive Committees, the antimicrobial dosage will be adjusted accordingly.  Current antimicrobial dosage:  Cefepime 2 gm IV q 12 hours  Indication: Sepsis   Renal Function:  Estimated Creatinine Clearance: 29.8 mL/min (A) (by C-G formula based on SCr of 1.81 mg/dL (H)). []      On intermittent HD, scheduled: []      On CRRT    Antimicrobial dosage has been changed to:  Cefepime 2 gm IV Q 24 hours   Additional comments:   Thank you for allowing pharmacy to be a part of this patient's care.  Jimmy Footman, PharmD, BCPS, [place] [facility] Clinical Pharmacist Phone: [phone] April 19, 2022 8:58 AM

## 2022-04-20 NOTE — Progress Notes (Signed)
Wasted 60 mL of Fentanyl (5000 mcgs/100 mL) with Suzi Roots, RN.

## 2022-04-20 NOTE — Progress Notes (Signed)
eLink Physician-Brief Progress Note Patient Name: Melissa Leon DOB: 11/28/1945 MRN: [id number]   Date of Service  04/19/2022  HPI/Events of Note  Glucose controlled. Endotool prompting to to transition.   eICU Interventions  Will transition to lantus 0.3units/kg (24 units q24 hours) along with q4 hour resistant sliding scale coverage.  Will monitor glucose trends and make further adjustmennts as needed.         [place] April 19, 2022, 2:14 AM

## 2022-04-20 NOTE — TOC Progression Note (Signed)
Transition of Care [facility]) - Progression Note    Patient Details  Name: Melissa Leon MRN: [id number] Date of Birth: Jul 30, 1945  Transition of Care [facility]) CM/SW Contact  Tom-Johnson, Renea Ee, RN Phone Number: 03-May-2022, 1:23 PM  Clinical Narrative:     Patient continues to be on full Vent and deeply sedated.  CM will continue to follow for needs.           Expected Discharge Plan: [place] Barriers to Discharge: Continued Medical Work up  Expected Discharge Plan and Services   Discharge Planning Services: CM Consult   Living arrangements for the past 2 months: [place]: PT, RN, OT, Nurse's Aide (Resumption of care) [place]: [place] Date [place]: 04/01/22 Time [place]: 1150 Representative spoke with at [place]: [place] ([place]) Interventions SDOH Screenings   Food Insecurity: No Food Insecurity (03/31/2022)  Housing: Low Risk  (03/31/2022)  Transportation Needs: No Transportation Needs (03/31/2022)  Utilities: Not At Risk (03/31/2022)  Tobacco Use: Medium Risk (03/31/2022)    Readmission Risk Interventions    01/06/2022    9:26 AM  Readmission Risk Prevention Plan  Transportation Screening Complete  PCP or Specialist Appt within 5-7 Days Complete  Home Care Screening Complete  Medication Review (RN CM) Complete

## 2022-04-20 NOTE — Progress Notes (Signed)
ANTICOAGULATION CONSULT NOTE - Follow Up Consult  Pharmacy Consult for heparin Indication: DVT  Labs: Recent Labs    04/04/22 0436 04/04/22 1957 04/05/22 0553 04/05/22 0837 04/05/22 1242 04/05/22 1428 04/05/22 1556 05-06-22 0311  HGB 8.2*  --  7.5*  --   --   --   --  8.0*  HCT 24.8*  --  24.6*  --   --   --   --  25.9*  PLT 398  --  399  --   --   --   --  429*  APTT  --   --   --   --   --   --  88* 61*  HEPARINUNFRC  --  0.21*  --  0.53  --   --  0.47  --   CREATININE 0.79 0.92 1.09*  --   --   --   --   --   TROPONINIHS  --   --   --   --  635* 757*  --   --     Assessment: 76yo female subtherapeutic on heparin after one PTT at goal; no infusion issues or signs of bleeding per RN.  Goal of Therapy:  aPTT 66-102 seconds   Plan:  Will increase heparin infusion by 2 units/kg/hr to 1250 units/hr and check level in 8 hours.    Wynona Neat, PharmD, BCPS  05/06/22,4:34 AM

## 2022-04-20 NOTE — Progress Notes (Signed)
Patient passed at 1439. Death verified with Carolynn Comment, RN at bedside. Patient's family at bedside when patient passed. Personal belongings given to patient's family. [employer] called & spoke with Baldemar Friday - reference # [id number].

## 2022-04-20 NOTE — IPAL (Signed)
  Interdisciplinary Goals of Care Family Meeting   Date carried out:: 2022-04-16  Location of the meeting: Bedside  Member's involved: Physician and Family Member or next of kin  Durable Power of Attorney or acting medical decision maker: 4 adult children, 2 of their spouses    Discussion: We discussed goals of care for Melissa Leon .  Her family is worried she is suffering and agrees she is likely at the point that her body is no longer able to compensate. They understand her prognosis is poor and chances of returning to her excellent pre-morbid activity level last year is minimal. They want to not prolong her suffering anymore and wish to proceed with terminal extubation today. We reviewed the steps involved and I recommended that anyone who wants to see her before she passes see her prior to extubation. They are planning on terminal extubation this afternoon at some point.  Code status: Full DNR  Disposition: In-patient comfort care   Time spent for the meeting: 15 min.  Julian Hy Apr 16, 2022, 1:30 PM

## 2022-04-20 NOTE — Telephone Encounter (Signed)
Received a call from pt's daughter Lenna Sciara. Melissa stated that while pt was in the hospital before she passed, she was at the hospital with her and due to this, she missed a week of work.  Due to how much work she missed, her job is now asking for her to get a note. Lenna Sciara is wanting to know if Dr. Vaughan Browner would be okay writing a note for her to turn into her job stating that she was there with her mom.  Dr. Vaughan Browner, please advise on this.

## 2022-04-20 NOTE — Progress Notes (Signed)
Per CCM order, RT extubated pt to comfort care/room air. RN currently at bedside.

## 2022-04-20 NOTE — Progress Notes (Signed)
NAME:  Melissa Leon, MRN:  [id number], DOB:  Jan 10, 1946, LOS: 7 ADMISSION DATE:  04/03/2022, CONSULTATION DATE:  03/31/2022 REFERRING MD:  Dr. 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   Pt initially was seen in 12/2021 for E. Coli UTI with severe hydronephrosis on the right secondary to ureteral obstruction requiring percutaneous nephrostomy tube placement. She underwent ureteral stent placement in 02/2022 after multiple rounds of antibiotics. Later in 02/2022 she was found to have a lumbar epidural abscess which was operated on and grew MRSA. TTE at that time without vegetations noted. She was discharged with a RUE PICC line on IV daptomycin started 03/06/2022 with planned 8 weeks of therapy to end on 04/12/2022. Following discharge she was admitted again with pneumonia on 03/19/2022. Daptomycin was held during that admission and restarted on discharge 03/25/2022. She was again admitted on 04/18/2022 with generalized weakness, hypothermia, cough, and dyspnea. She was found to have worsening infiltrates bilaterally on CXR and CT. With concerns for daptomycin induced eosinophilic pneumonia she was switched to ceftriaxone and vancomycin.   She has had progressive respiratory decline and is now on maximal support with HFNC 40L at 100% FiO2. She continues to have increased work of breathing, tachypnea, and diaphragmatic paradoxus while maintaining O2 sats of 96. Blood cultures are preliminarily negative. She is having worsening back pain similar to her prior lumbar abscess. She does not have other acute complaints.   Pertinent  Medical History  T2DM HTN HFpEF Spinal Stenosis Chronic IV antibiotic therapy with  daptomycin after MRSA+ lumbar spinal abscess R Ureteral obstruction with resultant hydronephrosis and pyelonephritis s/p JJ stent  Significant Hospital Events: Including procedures, antibiotic start and stop dates in addition to other pertinent events   3/11 admitted with sepsis and worsening multifocal pneumonia on chronic daptomycin, changed to ceftriaxone and vancomycin 3/12 transfer to MICU for intubation 2/2 progressive respiratory distress on HFNC 40L 100% FiO2 3/16 Remains on vent with FIO2 40% and 8 of peep, appears uncomfortable on vent despite fentanyl and precedex drip. Decision made with family mid afternoon to deeply sedate and utilize PRN paralytics in order to rest allow full vent compliance and to pulse dose steroids  3/17 Received to pushes of paralytics yesterday, deeply sedated on vent this am. Decreased bowel sounds   04/22/2022>> Blood pressure soft despite Levo at 44 mcg  Interim History / Subjective:  Deeply sedated  + 10.5 L  Continued hypotension . Levo up titrated and now  Rising troponins ( On heparin already for dvt) 635>>757>>1010 EKG changes   Objective   Blood pressure (!) 131/46, pulse (!) 132, temperature 98.8 F (37.1 C), temperature source Axillary, resp. rate (!) 9, height 5\' 7"  (1.702 m), weight 85.9 kg, SpO2 98 %.    Vent Mode: PRVC FiO2 (%):  [40 %] 40 % Set Rate:  [16 bmp] 16 bmp Vt Set:  [360 mL] 360 mL PEEP:  [5 cmH20-8 cmH20] 5 cmH20 Plateau Pressure:  [15 cmH20-18 cmH20] 15 cmH20   Intake/Output Summary (Last 24 hours) at 04/22/22 1114 Last data filed at 2022/04/22 1000 Gross per 24 hour  Intake 5196.97 ml  Output 675 ml  Net 4521.97 ml   Filed Weights   04/04/22 0358 04/05/22 0500 April 22, 2022 0500  Weight: 78.5 kg 81.3  kg 85.9 kg    Examination:   General: Acute on chronically ill appearing elderly female lying in bed on mechanical ventilation, in NAD HEENT: ETT, MM pink/moist, PERRL,  Neuro: Deeply sedated -5 RASS CV: s1s2 , EKG  changes noted PULM:  Bilateral chest excursion, Course breath sounds bilaterally, no increased work of breathing, compliant on vent  GI: soft, bowel sounds active in all 4 quadrants, non-tender, non-distended, tolerating TF Extremities: warm/dry, 2+ generalized edema  Skin: no rashes or lesions  Labs reviewed Na 127 ( 132) K 5.4, Cl 94, CO2 24. 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 Lower than I would expect for true eosinophilic process.  -RVP negative -Ceftriaxone stopped 3/14 per infectious disease.   P: Continue ventilator support with lung protective strategies  Deep sedation to allow for full vent compliance  PRN paralytics  Wean PEEP and FiO2 for sats greater than 90%. Head of bed elevated 30 degrees. Plateau pressures less than 30 cm H20.  Follow intermittent chest x-ray and ABG.   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 Asymptomatic  P: Follow renal function  Monitor urine output Trend Bmet Avoid nephrotoxins Ensure adequate renal perfusion   Hyperkalemia Plan Lokelma 5 grams BID x 2 doses Trend Potassium   HFpEF -ECHO 03/03/22 with EF 65-70% and grade I diastolic dysfunction. On repeat ECHO 3/16 EF remains 65-70% with now normal diastolic function  CAD HTN P: Continuous telemetry  ASA Strict intake and output  Daily weight  Daily assessment for ability to diureses  Home antihypertensives on hold  Sedation related hypotension  P: Continue  Levo for MAP goal of 65 mm Hg  T2DM -Hyperglycemia worsened with high dose steroids  P: Continue resistant SSI , increased to 8 units TF coverage  Increased long acting insulin to 24 units Levamir BID per Diabetic Co-ordinator CBG goal 140-180 Check CBG q4hrs   Acute right lower extremity DVT -Doppler positive for acute DVT involving the right soleal vein  P: Continue Heparin drip started 3/16  Anemia -No obvious bleeding source P: Follow CBC  Protein calorie malnutrition  Hypoalbuminemia  P: Continue TF coverage  Supplement protein   Physical deconditioning  -Patient has suffered multiple infections over the last several months with multiple admissions. At discharge 03/25/22 PT recommended home health PT due to physical deconditioning. She  is again critically ill on life support and will likely have a further decline with deconditioning P: PT/OT efforts as ab;e  Optimize nutrition as able   Constipation  P: Continue Colace and Milk of magnesia  Repeat Dulcolax suppository today if no stool seen with order enema as well    Best Practice (right click and "Reselect all SmartList Selections" daily)   Diet/type: NPO TF DVT prophylaxis: LMWH, SCD GI prophylaxis: N/A Lines: N/A Foley:  N/A Code Status:   full code Last date of multidisciplinary goals of care discussion: Update son , daughter at bedside 04/05/2021 Explained that the patient has continued to decompensate with worsening organ failure . We discussed her rising troponins and family have refused cardiology consult as they feel the patient has suffered enough. They are gathering now to make a decision about plans moving forward. I feel they will transition to comfort care today.   Critical care time:   CRITICAL CARE Performed by: Magdalen Spatz  Total critical care time: 45 minutes  Critical care time was exclusive of separately billable procedures and treating other patients.  Critical care was necessary to treat or prevent imminent or life-threatening deterioration.   Magdalen Spatz, MSN, AGACNP-BC [place] See Amion for personal pager PCCM on call pager [phone] [facility] Personal contact information can be found on Amion  If no contact or response made please call [phone] Apr 07, 2022, 11:14 AM

## 2022-04-20 NOTE — Inpatient Diabetes Management (Addendum)
Inpatient Diabetes Program Recommendations  AACE/ADA: New Consensus Statement on Inpatient Glycemic Control (2015)  Target Ranges:  Prepandial:   less than 140 mg/dL      Peak postprandial:   less than 180 mg/dL (1-2 hours)      Critically ill patients:  140 - 180 mg/dL   Lab Results  Component Value Date   GLUCAP 202 (H) 28-Apr-2022   HGBA1C 5.6 01/04/2022    Review of Glycemic Control  Latest Reference Range & Units 2022-04-28 01:17 04-28-2022 02:24 04/28/2022 03:24 April 28, 2022 04:43 04-28-22 05:23 2022/04/28 07:12  Glucose-Capillary 70 - 99 mg/dL 175 (H) 163 (H) 178 (H) 165 (H) 156 (H) 202 (H)   Diabetes history: DM  Outpatient Diabetes medications:  Metformin 1000 mg bid Amaryl 4 mg daily Current orders for Inpatient glycemic control:  Novolog 0-20 units q 4 hours Levemir 24 units q 24 hours Solumedrol 125 mg q 6 hours-  Inpatient Diabetes Program Recommendations:    Note patient transitioned off insulin drip overnight.  Insulin drip rate was approximately 5.5 units/hr. While on steroids consider increase of basal and tube feed coverage.  Based on this insulin drip rate, consider increasing Levemir to 24 units bid and add Novolog tube feed coverage 8 units q 4 hours.   Thanks,  Adah Perl, RN, BC-ADM Inpatient Diabetes Coordinator Pager [phone]  (8a-5p)

## 2022-04-20 NOTE — Death Summary Note (Signed)
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[place]) Active Problems:   Sepsis without acute organ dysfunction ([place])   Multifocal pneumonia   Acute respiratory failure with hypoxia ([place])   Sepsis ([place])   Malnutrition of moderate degree   Pressure injury of skin   Chronic heart failure with preserved ejection fraction (HFpEF) ([place])   Epidural abscess  ARDS Possibly acute eosinophilic pneumonia (does not meet 25% eos on BAL, but may have been started on steroids previously) AKI Elevated troponin Hyponatremia Hyperkalemia Hyperphosphatemia Anemia Thrombocytosis, likely reactive Hyperglycemia, DM DVT RLE acute MRSA L spine OM Spinal stenosis Acute HFpEF H/o HTN CAD Hypotension due to sedation Anemia Protein calorie malnutrition, moderate Constipation Deconditioning      Brief Hospital Course (including significant findings, care, treatment, and services provided and events leading to death)  Jun 28, 2022 Melissa Leon is a 77 y.o. 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    Pt initially was seen in 12/2021 for E. Coli UTI with severe hydronephrosis on the right secondary to ureteral obstruction requiring percutaneous nephrostomy tube placement. She underwent ureteral stent placement in 02/2022  after multiple rounds of antibiotics. Later in 02/2022 she was found to have a lumbar epidural abscess which was operated on and grew MRSA. TTE at that time without vegetations noted. She was discharged with a RUE PICC line on IV daptomycin started 03/06/2022 with planned 8 weeks of therapy to end on 04/12/2022. Following discharge she was admitted again with pneumonia on 03/19/2022. Daptomycin was held during that admission and restarted on discharge 03/25/2022. She was again admitted on Apr 07, 2022 with generalized weakness, hypothermia, cough, and dyspnea. She was found to have worsening infiltrates bilaterally on CXR and CT. With concerns for daptomycin induced eosinophilic pneumonia she was switched to ceftriaxone and vancomycin.    She has had progressive respiratory decline and is now on maximal support with HFNC 40L at 100% FiO2. She continues to have increased work of breathing, tachypnea, and diaphragmatic paradoxus while maintaining O2 sats of 96. Blood cultures are preliminarily negative. She is having worsening back pain similar to her prior lumbar abscess. She does not have other acute complaints.   She had a long hospital course without significant improvement despite broad spectrum antibiotics, steroids, and supportive care measures. She developed progressive MOF with developing AKI with multiple electrolyte abnormalities, progressive volume overload and acute HFpEF without improvement in her pulmonary function. Her family elected to withdraw aggressive care measures to focus more on her comfort. She passed away with family at bedside.   Pertinent Labs and Studies  Significant Diagnostic Studies DG CHEST PORT 1 VIEW  Result Date: 2022/04/14 CLINICAL DATA:  Acute respiratory failure EXAM: PORTABLE CHEST 1 VIEW COMPARISON:  04/05/2022 FINDINGS: Cardiac shadow is stable. Endotracheal tube is noted in satisfactory position. Gastric catheter extends in the stomach. Right PICC is noted with the tip at  the cavoatrial junction  stable from the previous exam. Patchy airspace opacities are again of identified throughout both lungs slightly improved when compared with the prior exam. IMPRESSION: Slight improved aeration bilaterally. Electronically Signed   By: Inez Catalina M.D.   On: 26-Apr-2022 10:41   DG CHEST PORT 1 VIEW  Result Date: 04/05/2022 CLINICAL DATA:  Acute respiratory fail EXAM: PORTABLE CHEST 1 VIEW COMPARISON:  03/06/2022 FINDINGS: Cardiac shadow is stable. Endotracheal tube, gastric catheter and right PICC are again seen and stable. Patchy airspace opacities are again identified but slightly improved when compared with the prior exam although this may be related to the technical factors of the film. No bony abnormality is noted. IMPRESSION: Persistent diffuse airspace opacity slightly improved from the prior exam. Electronically Signed   By: Inez Catalina M.D.   On: 04/05/2022 11:49   VAS [place] LOWER EXTREMITY VENOUS (DVT)  Result Date: 04/05/2022  Lower Venous DVT Study Patient Name:  Melissa Leon  Date of Exam:   04/04/2022 Medical Rec #: [id number]         Accession #:    [id number] Date of Birth: 1945-12-07          Patient Gender: F Patient Age:   77 years Exam Location:  [facility] Procedure:      VAS [place] LOWER EXTREMITY VENOUS (DVT) Referring Phys: [name] --------------------------------------------------------------------------------  Indications: Swelling.  Comparison Study: No prior study. Performing Technologist: McKayla Maag RVT, [place]  Examination Guidelines: A complete evaluation includes B-mode imaging, spectral Doppler, color Doppler, and power Doppler as needed of all accessible portions of each vessel. Bilateral testing is considered an integral part of a complete examination. Limited examinations for reoccurring indications may be performed as noted. The reflux portion of the exam is performed with the patient in reverse Trendelenburg.   +---------+---------------+---------+-----------+----------+-------------------+ RIGHT    CompressibilityPhasicitySpontaneityPropertiesThrombus Aging      +---------+---------------+---------+-----------+----------+-------------------+ CFV      Full           Yes      Yes                                      +---------+---------------+---------+-----------+----------+-------------------+ SFJ      Full                                                             +---------+---------------+---------+-----------+----------+-------------------+ FV Prox  Full                                                             +---------+---------------+---------+-----------+----------+-------------------+ FV Mid   Full                                                             +---------+---------------+---------+-----------+----------+-------------------+ FV DistalFull                                                             +---------+---------------+---------+-----------+----------+-------------------+  PFV      Full                                                             +---------+---------------+---------+-----------+----------+-------------------+ POP      Full           Yes      Yes                                      +---------+---------------+---------+-----------+----------+-------------------+ PTV      Full                                                             +---------+---------------+---------+-----------+----------+-------------------+ PERO     Full                                         Not well visualized +---------+---------------+---------+-----------+----------+-------------------+ Soleal   None           Yes      Yes                  Acute               +---------+---------------+---------+-----------+----------+-------------------+   +---------+---------------+---------+-----------+----------+-------------------+  LEFT     CompressibilityPhasicitySpontaneityPropertiesThrombus Aging      +---------+---------------+---------+-----------+----------+-------------------+ CFV      Full           Yes      Yes                                      +---------+---------------+---------+-----------+----------+-------------------+ SFJ      Full                                                             +---------+---------------+---------+-----------+----------+-------------------+ FV Prox  Full                                                             +---------+---------------+---------+-----------+----------+-------------------+ FV Mid   Full                                                             +---------+---------------+---------+-----------+----------+-------------------+ FV DistalFull                                                             +---------+---------------+---------+-----------+----------+-------------------+  PFV      Full                                                             +---------+---------------+---------+-----------+----------+-------------------+ POP      Full           Yes      Yes                                      +---------+---------------+---------+-----------+----------+-------------------+ PTV      Full                                                             +---------+---------------+---------+-----------+----------+-------------------+ PERO     Full                                         Not well visualized +---------+---------------+---------+-----------+----------+-------------------+     Summary: RIGHT: - Findings consistent with acute deep vein thrombosis involving the right soleal veins. - Portions of this examination were limited- see technologist comments above. - No cystic structure found in the popliteal fossa.  LEFT: - There is no evidence of deep vein thrombosis in the lower extremity. However,  portions of this examination were limited- see technologist comments above.  - No cystic structure found in the popliteal fossa.  *See table(s) above for measurements and observations. Electronically signed by Servando Snare MD on 04/05/2022 at 10:36:25 AM.    Final    ECHOCARDIOGRAM COMPLETE  Result Date: 04/04/2022    ECHOCARDIOGRAM REPORT   Patient Name:   Melissa Leon Date of Exam: 04/04/2022 Medical Rec #:  [id number]        Height:       67.0 in Accession #:    [id number]       Weight:       173.1 lb Date of Birth:  1945-07-23         BSA:          1.902 m Patient Age:    48 years         BP:           109/48 mmHg Patient Gender: F                HR:           69 bpm. Exam Location:  Inpatient Procedure: 2D Echo, Cardiac Doppler and Color Doppler Indications:    CHF-Acute Diastolic [id number]  History:        Patient has prior history of Echocardiogram examinations, most                 recent 03/03/2022. CHF; Risk Factors:Hypertension and Diabetes.  Sonographer:    Ronny Flurry Referring Phys: [id number] [place]  1. Left ventricular ejection fraction, by estimation, is 65 to 70%. The left ventricle has normal function. The left ventricle has no regional wall motion abnormalities. Left  ventricular diastolic parameters are indeterminate.  2. Right ventricular systolic function is normal. The right ventricular size is normal.  3. The mitral valve is normal in structure. Trivial mitral valve regurgitation. No evidence of mitral stenosis.  4. The aortic valve is normal in structure. Aortic valve regurgitation is not visualized. No aortic stenosis is present.  5. The inferior vena cava is dilated in size with <50% respiratory variability, suggesting right atrial pressure of 15 mmHg. Comparison(s): No significant change from prior study. Prior images reviewed side by side. FINDINGS  Left Ventricle: Left ventricular ejection fraction, by estimation, is 65 to 70%. The left ventricle has normal  function. The left ventricle has no regional wall motion abnormalities. The left ventricular internal cavity size was normal in size. There is  no left ventricular hypertrophy. Left ventricular diastolic parameters are indeterminate. Right Ventricle: The right ventricular size is normal. No increase in right ventricular wall thickness. Right ventricular systolic function is normal. Left Atrium: Left atrial size was normal in size. Right Atrium: Right atrial size was normal in size. Pericardium: There is no evidence of pericardial effusion. Mitral Valve: The mitral valve is normal in structure. Trivial mitral valve regurgitation. No evidence of mitral valve stenosis. Tricuspid Valve: The tricuspid valve is normal in structure. Tricuspid valve regurgitation is mild . No evidence of tricuspid stenosis. Aortic Valve: The aortic valve is normal in structure. Aortic valve regurgitation is not visualized. No aortic stenosis is present. Aortic valve mean gradient measures 7.0 mmHg. Aortic valve peak gradient measures 12.4 mmHg. Aortic valve area, by VTI measures 2.04 cm. Pulmonic Valve: The pulmonic valve was normal in structure. Pulmonic valve regurgitation is not visualized. No evidence of pulmonic stenosis. Aorta: The aortic root is normal in size and structure. Venous: The inferior vena cava is dilated in size with less than 50% respiratory variability, suggesting right atrial pressure of 15 mmHg. IAS/Shunts: No atrial level shunt detected by color flow Doppler.  LEFT VENTRICLE PLAX 2D LVIDd:         4.50 cm   Diastology LVIDs:         2.80 cm   LV e' medial:    6.76 cm/s LV PW:         1.00 cm   LV E/e' medial:  16.1 LV IVS:        1.00 cm   LV e' lateral:   9.49 cm/s LVOT diam:     1.90 cm   LV E/e' lateral: 11.5 LV SV:         71 LV SV Index:   37 LVOT Area:     2.84 cm  RIGHT VENTRICLE             IVC RV S prime:     16.80 cm/s  IVC diam: 2.10 cm TAPSE (M-mode): 2.0 cm LEFT ATRIUM             Index        RIGHT  ATRIUM           Index LA diam:        3.70 cm 1.95 cm/m   RA Area:     11.60 cm LA Vol (A2C):   46.7 ml 24.56 ml/m  RA Volume:   21.90 ml  11.52 ml/m LA Vol (A4C):   25.4 ml 13.36 ml/m LA Biplane Vol: 35.5 ml 18.67 ml/m  AORTIC VALVE AV Area (Vmax):    2.21 cm AV Area (Vmean):   2.07 cm AV Area (  VTI):     2.04 cm AV Vmax:           176.00 cm/s AV Vmean:          120.000 cm/s AV VTI:            0.350 m AV Peak Grad:      12.4 mmHg AV Mean Grad:      7.0 mmHg LVOT Vmax:         137.00 cm/s LVOT Vmean:        87.525 cm/s LVOT VTI:          0.251 m LVOT/AV VTI ratio: 0.72  AORTA Ao Root diam: 3.20 cm Ao Asc diam:  3.30 cm MITRAL VALVE MV Area (PHT): 3.27 cm     SHUNTS MV Decel Time: 232 msec     Systemic VTI:  0.25 m MV E velocity: 109.00 cm/s  Systemic Diam: 1.90 cm MV A velocity: 90.30 cm/s MV E/A ratio:  1.21 Candee Furbish MD Electronically signed by Candee Furbish MD Signature Date/Time: 04/04/2022/4:51:46 PM    Final    DG CHEST PORT 1 VIEW  Result Date: 04/04/2022 CLINICAL DATA:  Acute respiratory distress. EXAM: PORTABLE CHEST 1 VIEW COMPARISON:  Radiograph yesterday. CT 04/05/2022 FINDINGS: Stable positioning of endotracheal tube, enteric tube, and right upper extremity PICC. Extensive bilateral airspace opacities, right greater than left, without significant interval change. No pneumothorax. Pleural effusions on prior CT are not well-defined by radiograph. Stable heart size and mediastinal contours. IMPRESSION: 1. Extensive bilateral airspace opacities, right greater than left, without significant interval change from yesterday. 2. Stable support apparatus. Electronically Signed   By: Keith Rake M.D.   On: 04/04/2022 16:16   [place] EKG SITE RITE  Result Date: 04/04/2022 If Site Rite image not attached, placement could not be confirmed due to current cardiac rhythm.  DG CHEST PORT 1 VIEW  Result Date: 04/03/2022 CLINICAL DATA:  Acute respiratory failure. EXAM: PORTABLE CHEST 1 VIEW COMPARISON:   Radiographs 04/02/2022 and 03/31/2022.  CT 04/05/2022. FINDINGS: 0859 hours. The endotracheal tube, right arm PICC and enteric tube appear unchanged. The heart size and mediastinal contours are stable with mild aortic atherosclerosis. Diffuse right greater than left airspace opacities are again noted with partial clearing over the last 3 days. No evidence of pneumothorax or significant pleural effusion. The bones appear unchanged. Telemetry leads overlie the chest. IMPRESSION: Partial clearing of right greater than left airspace opacities over the last 3 days. Stable support system. Electronically Signed   By: Richardean Sale M.D.   On: 04/03/2022 10:02   DG CHEST PORT 1 VIEW  Result Date: 04/02/2022 CLINICAL DATA:  Respiratory failure. EXAM: PORTABLE CHEST 1 VIEW COMPARISON:  03/31/2022 FINDINGS: The endotracheal tube is 2.8 cm above the carina. The NG tube is coursing down the esophagus and into the stomach. The right PICC line is stable with its tip in the distal SVC. Persistent diffuse but asymmetric airspace process in the lungs. No large pleural effusions or pneumothorax. IMPRESSION: 1. Stable support apparatus. 2. Persistent diffuse but asymmetric airspace process in the lungs. Electronically Signed   By: Marijo Sanes M.D.   On: 04/02/2022 11:56   MR Lumbar Spine W Wo Contrast  Result Date: 04/01/2022 CLINICAL DATA:  Low back pain EXAM: MRI LUMBAR SPINE WITHOUT AND WITH CONTRAST TECHNIQUE: Multiplanar and multiecho pulse sequences of the lumbar spine were obtained without and with intravenous contrast. CONTRAST:  47mL GADAVIST GADOBUTROL 1 MMOL/ML IV SOLN COMPARISON:  02/28/2022 FINDINGS:  Segmentation: Transitional lumbosacral anatomy with partial sacralization of L5. Numbering unchanged from prior study. Alignment:  Physiologic. Vertebrae: New superior endplate edema at L3. Status post left laminectomies at L3 and L4. Conus medullaris and cauda equina: Conus extends to the L1 level. Conus and cauda  equina appear normal. Previously seen epidural collection has resolved. Paraspinal and other soft tissues: Small amount of dorsal paraspinal fluid at the L3 and L4 levels. Fatty atrophy of the paraspinous muscles. No psoas abscess. Right hydronephrosis. Disc levels: L1-L2: Normal disc space and facet joints. No spinal canal stenosis. No neural foraminal stenosis. L2-L3: Unchanged small disc bulge with mild facet hypertrophy. Mild spinal canal stenosis. No neural foraminal stenosis. L3-L4: Status post left laminectomy. Small disc bulge with severe facet arthrosis. Improved patency of the spinal canal with moderate residual spinal canal stenosis. Mild bilateral neural foraminal stenosis. L4-L5: Left laminectomy with improved patency of the spinal canal. Mild spinal canal stenosis. Moderate facet arthrosis. Moderate right neural foraminal stenosis. L5-S1: Transitional level. No spinal canal stenosis. No neural foraminal stenosis. Visualized sacrum: Normal. IMPRESSION: 1. Previously seen epidural collection has resolved. 2. Status post left laminectomies at L3 and L4 with improved patency of the spinal canal at both levels. Moderate residual L3-4 and mild L4-5 spinal canal stenosis. 3. New superior endplate edema at L3 may be a source of local low back pain. No abnormal signal within the disc space to indicate discitis. 4. Moderate right L4-5 neural foraminal stenosis. Electronically Signed   By: Ulyses Jarred M.D.   On: 04/01/2022 00:45   DG CHEST PORT 1 VIEW  Result Date: 03/31/2022 CLINICAL DATA:  Intubation EXAM: PORTABLE CHEST 1 VIEW COMPARISON:  Portable exam 1052 hours compared to 04/03/2022 FINDINGS: Tip of endotracheal tube projects 3.0 cm above carina. Nasogastric tube extends into stomach. Normal heart size and mediastinal contours. Atherosclerotic calcification aorta. Patchy airspace infiltrates bilaterally, slightly greater on LEFT, increased from previous exam, most consistent with multifocal  pneumonia. Trace pleural effusions bilaterally. No pneumothorax or acute osseous findings. IMPRESSION: Patchy BILATERAL airspace infiltrates slightly increased from previous exam and asymmetrically greater on LEFT, favoring multifocal pneumonia. Trace bibasilar pleural effusions. Electronically Signed   By: Lavonia Dana M.D.   On: 03/31/2022 11:06   CT CHEST ABDOMEN PELVIS W CONTRAST  Result Date: 04/13/2022 CLINICAL DATA:  Pneumonia, fatigue, recent hospitalization and surgeries including nephrostomy and surgical procedure for spinal abscess EXAM: CT CHEST, ABDOMEN, AND PELVIS WITH CONTRAST TECHNIQUE: Multidetector CT imaging of the chest, abdomen and pelvis was performed following the standard protocol during bolus administration of intravenous contrast. RADIATION DOSE REDUCTION: This exam was performed according to the departmental dose-optimization program which includes automated exposure control, adjustment of the mA and/or kV according to patient size and/or use of iterative reconstruction technique. CONTRAST:  50mL OMNIPAQUE IOHEXOL 350 MG/ML SOLN COMPARISON:  CT abdomen pelvis, 03/19/2022, CT chest, 02/24/2022 FINDINGS: CT CHEST FINDINGS Cardiovascular: Aortic atherosclerosis. Normal heart size. Three-vessel coronary artery calcifications. No pericardial effusion. Mediastinum/Nodes: Prominent mediastinal lymph nodes, likely reactive. Thyroid gland, trachea, and esophagus demonstrate no significant findings. Lungs/Pleura: Very extensive heterogeneous and consolidative airspace opacity throughout the lungs with small bilateral pleural effusions. Musculoskeletal: No chest wall abnormality. No acute osseous findings. CT ABDOMEN PELVIS FINDINGS Hepatobiliary: No focal liver abnormality is seen. Status post cholecystectomy. Unchanged postoperative biliary dilatation. Pancreas: Unremarkable. No pancreatic ductal dilatation or surrounding inflammatory changes. Spleen: Normal in size without significant  abnormality. Adrenals/Urinary Tract: Adrenal glands are unremarkable. Similar right hydronephrosis with a right-sided double-J  ureteral stent catheter, formed pigtails in the right renal pelvis and urinary bladder. Left kidney is normal, without renal calculi, solid lesion, or hydronephrosis. Bladder is unremarkable. Stomach/Bowel: Stomach is within normal limits. Diverticulum of the descending portion of the duodenum. Appendix appears normal. No evidence of bowel wall thickening, distention, or inflammatory changes. Sigmoid diverticula. Large burden of stool throughout the colon. Vascular/Lymphatic: Aortic atherosclerosis. No enlarged abdominal or pelvic lymph nodes. Reproductive: No mass or other abnormality. Other: No abdominal wall hernia or abnormality. No ascites. Musculoskeletal: No acute osseous findings. 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 IMPRESSION: 1. Very extensive heterogeneous and consolidative airspace opacity throughout the lungs with small bilateral pleural effusions. Findings are consistent with multifocal infection. 2. Similar right hydronephrosis with a right-sided double-J ureteral stent catheter, formed pigtails in the right renal pelvis and urinary bladder. 3. Coronary artery disease. Aortic Atherosclerosis (ICD10-I70.0). Electronically Signed   By: Delanna Ahmadi M.D.   On: 04/09/2022 16:14   DG Chest Portable 1 View  Result Date: 04/05/2022 CLINICAL DATA:  Weakness EXAM: PORTABLE CHEST 1 VIEW COMPARISON:  CXR 03/22/22 FINDINGS: Right arm PICC with the tip in the lower SVC. There evolving bilateral airspace opacities with interval decrease in previously seen airspace opacities in the left upper lung and marked interval progression in the peripheral aspect of the right upper and mid lung fields. No large pleural effusion. No pneumothorax. Unchanged cardiac and mediastinal contours. No radiographically  apparent displaced rib fractures. Visualized upper abdomen is unremarkable. IMPRESSION: Evolving bilateral airspace opacities with interval decrease in previously seen airspace opacities in the left upper lung and marked interval progression in the peripheral aspect of the right upper and mid lung fields. Findings are concerning for multifocal pneumonia. Recommend repeat chest CT for further evaluation. Electronically Signed   By: Marin Roberts M.D.   On: 04/01/2022 12:41   DG CHEST PORT 1 VIEW  Result Date: 03/22/2022 CLINICAL DATA:  10027 Tachypnea 786.06.ICD-9-CM [id number] Pneumonia [id number] EXAM: PORTABLE CHEST 1 VIEW COMPARISON:  March 21, 2022 FINDINGS: The cardiomediastinal silhouette is unchanged in contour.RIGHT upper extremity PICC tip terminates over the inferior SVC. Atherosclerotic calcifications. Small LEFT pleural effusion. No pneumothorax. Mildly improved aeration of the LEFT upper lung heterogeneous airspace opacity. Mildly increased confluence of the RIGHT upper lung heterogeneous airspace opacity. Similar appearance of patchy opacities at the RIGHT lung base. IMPRESSION: 1. Mildly increased confluence of the RIGHT upper lung airspace opacity. Minimally improved aeration of the LEFT upper lung airspace opacity. 2.  Small LEFT pleural effusion. Electronically Signed   By: Valentino Saxon M.D.   On: 03/22/2022 18:16   DG CHEST PORT 1 VIEW  Result Date: 03/21/2022 CLINICAL DATA:  Hypoxia EXAM: PORTABLE CHEST 1 VIEW COMPARISON:  Chest x-ray 03/19/2022 FINDINGS: Increasing bilateral parenchymal opacities with increasing confluence area left upper lobe and developing new areas scattered throughout the right lung. Recommend follow-up. No pneumothorax, effusion or edema. Normal cardiopericardial silhouette. Calcified aorta. Right-sided PIC catheter in place with tip overlying the central SVC. Degenerative changes of the spine. Overlapping cardiac leads. IMPRESSION: Increasing bilateral consolidative  infiltrative opacities. Recommend follow-up Electronically Signed   By: Jill Side M.D.   On: 03/21/2022 10:55   DG Chest 1 View  Result Date: 03/19/2022 CLINICAL DATA:  Weakness EXAM: CHEST  1 VIEW COMPARISON:  Chest x-ray 12/10/2018 FINDINGS: There is a moderate amount of patchy airspace consolidation in the left mid and upper lung. There is  also faint patchy airspace disease in the right lung base. There is no pleural effusion or pneumothorax. Cardiomediastinal silhouette is within normal limits. Right-sided central venous catheter tip projects over the SVC. No acute fractures are seen. IMPRESSION: Bilateral airspace disease, left greater than right. Findings are concerning for multifocal pneumonia. Follow-up PA and lateral chest x-ray recommended in 4-6 weeks to confirm resolution. Electronically Signed   By: Ronney Asters M.D.   On: 03/19/2022 20:41   CT ABDOMEN PELVIS WO CONTRAST  Result Date: 03/19/2022 CLINICAL DATA:  Recent procedures of nephrostomy tube insertion and removal and removal of spinal abscess. Presenting with fever and weakness. Currently receiving daptomycin for infection. EXAM: CT ABDOMEN AND PELVIS WITHOUT CONTRAST TECHNIQUE: Multidetector CT imaging of the abdomen and pelvis was performed following the standard protocol without IV contrast. RADIATION DOSE REDUCTION: This exam was performed according to the departmental dose-optimization program which includes automated exposure control, adjustment of the mA and/or kV according to patient size and/or use of iterative reconstruction technique. COMPARISON:  CT 02/25/2022 FINDINGS: Lower chest: Patchy airspace opacities in the bilateral lower lung suspicious for pneumonia. Hepatobiliary: Unremarkable liver and biliary tree. Cholecystectomy. Pancreas: Unremarkable. Spleen: Unremarkable. Adrenals/Urinary Tract: Normal adrenal glands. Ureteral stent in the right ureter. Interval removal of the right nephrostomy tube with new dilation of  the right renal pelvis. No urinary calculi. Unremarkable bladder. Stomach/Bowel: Normal caliber large and small bowel. Colonic diverticulosis without diverticulitis. Moderate colonic stool load. The appendix is not visualized. Vascular/Lymphatic: Aortic atherosclerotic calcification. No lymphadenopathy. Reproductive: Unremarkable uterus and ovaries. Other: No free intraperitoneal fluid or air. Musculoskeletal: No acute osseous abnormality. IMPRESSION: 1. Patchy airspace opacities in the bilateral lower lung suspicious for pneumonia. 2. Interval removal of the right nephrostomy tube with new development of dilation of the right pelvis. Recommend correlation for proper functioning of the right ureteral stent. Aortic Atherosclerosis (ICD10-I70.0). Electronically Signed   By: Placido Sou M.D.   On: 03/19/2022 20:27    Microbiology Recent Results (from the past 240 hour(s))  Culture, blood (routine x 2)     Status: None   Collection Time: 04/11/2022 12:18 PM   Specimen: BLOOD  Result Value Ref Range Status   Specimen Description BLOOD LEFT ANTECUBITAL  Final   Special Requests   Final    BOTTLES DRAWN AEROBIC AND ANAEROBIC Blood Culture adequate volume   Culture   Final    NO GROWTH 5 DAYS Performed at [facility], [address], [place], [place] [address]    Report Status 04/04/2022 FINAL  Final  Culture, blood (routine x 2)     Status: None   Collection Time: 04/14/2022  7:42 PM   Specimen: BLOOD  Result Value Ref Range Status   Specimen Description BLOOD LEFT ANTECUBITAL  Final   Special Requests   Final    BOTTLES DRAWN AEROBIC AND ANAEROBIC Blood Culture results may not be optimal due to an inadequate volume of blood received in culture bottles   Culture   Final    NO GROWTH 5 DAYS Performed at [facility], [place] [address], [place], [place] [address]    Report Status 04/04/2022 FINAL  Final  Culture, Respiratory w Gram Stain     Status: None   Collection Time: 03/31/22  10:13 AM   Specimen: Bronchoalveolar Lavage; Respiratory  Result Value Ref Range Status   Specimen Description BRONCHIAL ALVEOLAR LAVAGE  Final   Special Requests NONE  Final   Gram Stain   Final  FEW WBC PRESENT, PREDOMINANTLY PMN NO ORGANISMS SEEN    Culture   Final    NO GROWTH 2 DAYS Performed at [place] [address] [place], [place] [address]    Report Status 04/02/2022 FINAL  Final  Acid Fast Smear (AFB)     Status: None   Collection Time: 03/31/22 10:13 AM   Specimen: Bronchoalveolar Lavage; Respiratory  Result Value Ref Range Status   AFB Specimen Processing Concentration  Final   Acid Fast Smear Negative  Final    Comment: (NOTE) Performed At: [facility] [place], [place] [id number] Rush Farmer MD [id number]    Source (AFB) BRONCHIAL ALVEOLAR LAVAGE  Final    Comment: Performed at [facility], [place] [address] [place], [place] [address]  Fungus Culture With Stain     Status: None (Preliminary result)   Collection Time: 03/31/22 10:13 AM   Specimen: Bronchial Alveolar Lavage; Respiratory  Result Value Ref Range Status   Fungus Stain Final report  Final    Comment: (NOTE) Performed At: [facility] [place], [place] [id number] Rush Farmer MD [id number]    Fungus (Mycology) Culture PENDING  Incomplete   Fungal Source BRONCHIAL ALVEOLAR LAVAGE  Final    Comment: Performed at [facility], [place] [address] [place], [place] [address]  Respiratory (~20 pathogens) panel by PCR     Status: None   Collection Time: 03/31/22 10:13 AM   Specimen: Bronchoalveolar Lavage; Respiratory  Result Value Ref Range Status   Adenovirus NOT DETECTED NOT DETECTED Final   Coronavirus 229E NOT DETECTED NOT DETECTED Final    Comment: (NOTE) The Coronavirus on the Respiratory Panel, DOES NOT test for the novel  Coronavirus (2019 nCoV)    Coronavirus HKU1 NOT DETECTED NOT DETECTED Final   Coronavirus  NL63 NOT DETECTED NOT DETECTED Final   Coronavirus OC43 NOT DETECTED NOT DETECTED Final   Metapneumovirus NOT DETECTED NOT DETECTED Final   Rhinovirus / Enterovirus NOT DETECTED NOT DETECTED Final   Influenza A NOT DETECTED NOT DETECTED Final   Influenza B NOT DETECTED NOT DETECTED Final   Parainfluenza Virus 1 NOT DETECTED NOT DETECTED Final   Parainfluenza Virus 2 NOT DETECTED NOT DETECTED Final   Parainfluenza Virus 3 NOT DETECTED NOT DETECTED Final   Parainfluenza Virus 4 NOT DETECTED NOT DETECTED Final   Respiratory Syncytial Virus NOT DETECTED NOT DETECTED Final   Bordetella pertussis NOT DETECTED NOT DETECTED Final   Bordetella Parapertussis NOT DETECTED NOT DETECTED Final   Chlamydophila pneumoniae NOT DETECTED NOT DETECTED Final   Mycoplasma pneumoniae NOT DETECTED NOT DETECTED Final    Comment: Performed at [facility], [place]. [address] [place], [place] [address]  Fungus Culture Result     Status: None   Collection Time: 03/31/22 10:13 AM  Result Value Ref Range Status   Result 1 Comment  Final    Comment: (NOTE) KOH/Calcofluor preparation:  no fungus observed. Performed At: [facility] [place], [place] [id number] Rush Farmer MD [id number]   MRSA Next Gen by PCR, Nasal     Status: None   Collection Time: 03/31/22 11:30 AM   Specimen: Nasal Mucosa; Nasal Swab  Result Value Ref Range Status   MRSA by PCR Next Gen NOT DETECTED NOT DETECTED Final    Comment: (NOTE) The GeneXpert MRSA Assay (FDA approved for NASAL specimens only), is one component of a comprehensive MRSA colonization surveillance program. It is  not intended to diagnose MRSA infection nor to guide or monitor treatment for MRSA infections. Test performance is not FDA approved in patients less than 81 years old. Performed at [facility], [place] [address], [place], [place] [address]   Culture, Respiratory w Gram Stain     Status: None (Preliminary result)   Collection  Time: 04/05/22  8:38 AM   Specimen: Tracheal Aspirate; Respiratory  Result Value Ref Range Status   Specimen Description TRACHEAL ASPIRATE  Final   Special Requests NONE  Final   Gram Stain   Final    MODERATE WBC PRESENT, PREDOMINANTLY PMN RARE GRAM POSITIVE RODS RARE YEAST    Culture   Final    CULTURE REINCUBATED FOR BETTER GROWTH Performed at [facility], [place] [address], [place], [place] [address]    Report Status PENDING  Incomplete    Lab Basic Metabolic Panel: Recent Labs  Lab 04/02/22 1650 04/03/22 0514 04/04/22 0436 04/04/22 1957 04/05/22 0553 2022-04-26 0311  NA 133* 133* 133* 131* 132* 127*  K 4.8 4.9 4.5 4.6 4.5 5.4*  CL 104 103 98 101 97* 94*  CO2 22 24 27 26 25 24   GLUCOSE 381* 200* 190* 393* 366* 174*  BUN 49* 53* 55* 57* 66* 89*  CREATININE 0.85 0.71 0.79 0.92 1.09* 1.81*  CALCIUM 9.1 9.0 8.8* 7.6* 8.0* 8.4*  MG 2.1 2.1 1.8  --  2.3 2.8*  PHOS 3.7 3.7 3.3  --  6.3* 6.0*   Liver Function Tests: Recent Labs  Lab 04/02/22 0524 Apr 26, 2022 0311  AST 14* 19  ALT 21 26  ALKPHOS 104 110  BILITOT 0.3 0.8  PROT 5.8* 5.6*  ALBUMIN 2.0* 2.0*   No results for input(s): "LIPASE", "AMYLASE" in the last 168 hours. No results for input(s): "AMMONIA" in the last 168 hours. CBC: Recent Labs  Lab 04/01/22 0129 04/02/22 0524 04/03/22 0514 04/04/22 0436 04/05/22 0553 04-26-2022 0311  WBC 17.4* 19.9* 17.9* 16.8* 28.6* 54.9*  NEUTROABS 16.6* 18.8* 16.8* 15.4*  --   --   HGB 7.2* 7.8* 7.6* 8.2* 7.5* 8.0*  HCT 22.6* 23.7* 24.3* 24.8* 24.6* 25.9*  MCV 89.3 87.1 89.3 86.7 95.0 92.8  PLT 328 411* 379 398 399 429*   Cardiac Enzymes: No results for input(s): "CKTOTAL", "CKMB", "CKMBINDEX", "TROPONINI" in the last 168 hours. Sepsis Labs: Recent Labs  Lab 03/29/2022 1941 03/31/22 0208 04/03/22 0514 04/04/22 0436 04/05/22 0553 2022/04/26 0311  WBC  --    < > 17.9* 16.8* 28.6* 54.9*  LATICACIDVEN 1.1  --   --   --   --   --    < > = values in this interval  not displayed.    Procedures/Operations  Intubation Bronchoscopy with BAL   Julian Hy April 26, 2022, 3:08 PM

## 2022-04-20 DEATH — deceased

## 2022-04-21 NOTE — Telephone Encounter (Signed)
Yes. Ok to write work note for daughter. I can sign when back in clinic next week.

## 2022-04-21 NOTE — Telephone Encounter (Signed)
Spoke with Melissa who provided dates of absences. Melissa Leon is requesting that letter be signed sooner then next Monday when Dr. Vaughan Browner returns to clinic. Melissa did state that pt was saw by Dr. Erin Fulling briefly while is hospital. Dr. Vaughan Browner would you like me to ask Dr. Erin Fulling to sign? Melissa is aware that if Dr. Erin Fulling is not willing to sign she will have to wait till Dr. Vaughan Browner returns to clinic.

## 2022-04-21 NOTE — Telephone Encounter (Signed)
ATC Melissa Leon to let her know that Dr. Vaughan Browner ok'd letter for work. Dates of Melissa Leon's absence are needed to completed letter. Draft of letter has been pended.

## 2022-04-21 NOTE — Telephone Encounter (Signed)
PT daughter ret missed call. States her absence period was 3/11- 3/22.   She states she needs it sooner. Can another Dr. Luane School it for her? Dr. Eulas Post signed PT death cert. Dr. Annamaria Boots is a family friend also.   She lives close enough to pick it up from the front.

## 2022-04-22 ENCOUNTER — Encounter: Payer: Self-pay | Admitting: *Deleted

## 2022-04-22 NOTE — Telephone Encounter (Signed)
Spoke with Lenna Sciara about needing a work [employer] from office.  Melissa stated her supervisor asked for a letter with dates 03/20/2022-04/21/22.  Melissa is aware Dr. Vaughan Browner is out of office, but had given ok for letter.  Letter with corrected dates printed with Dr. Matilde Bash signature stamp. Advised Melissa to call if anything further was needed.  Letter placed at front desk for pick up. Nothing further at this time.

## 2022-04-30 LAB — FUNGUS CULTURE WITH STAIN

## 2022-04-30 LAB — FUNGUS CULTURE RESULT

## 2022-04-30 LAB — FUNGAL ORGANISM REFLEX

## 2022-05-19 LAB — ACID FAST CULTURE WITH REFLEXED SENSITIVITIES (MYCOBACTERIA): Acid Fast Culture: NEGATIVE
